# Patient Record
Sex: Female | Born: 1940 | Race: White | Hispanic: No | Smoking: Former smoker
Health system: Southern US, Community
[De-identification: ages and names within clinical notes are randomized; demographics above are authoritative.]

## PROBLEM LIST (undated history)

## (undated) DIAGNOSIS — I10 Essential (primary) hypertension: Secondary | ICD-10-CM

## (undated) DIAGNOSIS — T7840XA Allergy, unspecified, initial encounter: Secondary | ICD-10-CM

## (undated) DIAGNOSIS — I739 Peripheral vascular disease, unspecified: Secondary | ICD-10-CM

## (undated) DIAGNOSIS — G4730 Sleep apnea, unspecified: Secondary | ICD-10-CM

## (undated) DIAGNOSIS — M1990 Unspecified osteoarthritis, unspecified site: Secondary | ICD-10-CM

## (undated) DIAGNOSIS — I4891 Unspecified atrial fibrillation: Secondary | ICD-10-CM

## (undated) DIAGNOSIS — J45909 Unspecified asthma, uncomplicated: Secondary | ICD-10-CM

## (undated) HISTORY — PX: TOE SURGERY: SHX1073

## (undated) HISTORY — DX: Peripheral vascular disease, unspecified: I73.9

---

## 1989-08-16 HISTORY — PX: ABDOMINAL HYSTERECTOMY: SHX81

## 2007-08-17 HISTORY — PX: COLONOSCOPY: SHX174

## 2012-02-29 DIAGNOSIS — E039 Hypothyroidism, unspecified: Secondary | ICD-10-CM | POA: Insufficient documentation

## 2012-02-29 DIAGNOSIS — E785 Hyperlipidemia, unspecified: Secondary | ICD-10-CM | POA: Insufficient documentation

## 2012-02-29 DIAGNOSIS — I482 Chronic atrial fibrillation, unspecified: Secondary | ICD-10-CM | POA: Insufficient documentation

## 2012-02-29 DIAGNOSIS — E782 Mixed hyperlipidemia: Secondary | ICD-10-CM | POA: Insufficient documentation

## 2015-09-25 DIAGNOSIS — J45998 Other asthma: Secondary | ICD-10-CM | POA: Insufficient documentation

## 2015-10-07 DIAGNOSIS — G4733 Obstructive sleep apnea (adult) (pediatric): Secondary | ICD-10-CM | POA: Insufficient documentation

## 2015-12-03 DIAGNOSIS — H903 Sensorineural hearing loss, bilateral: Secondary | ICD-10-CM | POA: Insufficient documentation

## 2015-12-03 DIAGNOSIS — H6060 Unspecified chronic otitis externa, unspecified ear: Secondary | ICD-10-CM | POA: Insufficient documentation

## 2017-05-28 ENCOUNTER — Encounter: Payer: Self-pay | Admitting: Emergency Medicine

## 2017-05-28 ENCOUNTER — Emergency Department: Payer: Medicare Other

## 2017-05-28 ENCOUNTER — Emergency Department
Admission: EM | Admit: 2017-05-28 | Discharge: 2017-05-28 | Disposition: A | Discharge status: Home / Self Care | Payer: Medicare Other | Attending: Emergency Medicine | Admitting: Emergency Medicine

## 2017-05-28 DIAGNOSIS — Y999 Unspecified external cause status: Secondary | ICD-10-CM | POA: Insufficient documentation

## 2017-05-28 DIAGNOSIS — I10 Essential (primary) hypertension: Secondary | ICD-10-CM | POA: Diagnosis not present

## 2017-05-28 DIAGNOSIS — S0990XA Unspecified injury of head, initial encounter: Secondary | ICD-10-CM | POA: Insufficient documentation

## 2017-05-28 DIAGNOSIS — Y929 Unspecified place or not applicable: Secondary | ICD-10-CM | POA: Diagnosis not present

## 2017-05-28 DIAGNOSIS — W010XXA Fall on same level from slipping, tripping and stumbling without subsequent striking against object, initial encounter: Secondary | ICD-10-CM | POA: Insufficient documentation

## 2017-05-28 DIAGNOSIS — S7001XA Contusion of right hip, initial encounter: Secondary | ICD-10-CM | POA: Insufficient documentation

## 2017-05-28 DIAGNOSIS — Y939 Activity, unspecified: Secondary | ICD-10-CM | POA: Insufficient documentation

## 2017-05-28 HISTORY — DX: Essential (primary) hypertension: I10

## 2017-05-28 HISTORY — DX: Unspecified atrial fibrillation: I48.91

## 2017-05-28 NOTE — ED Triage Notes (Addendum)
Patient fell at home tonight hitting her head left occiput, and fell on her elbows and is c/o right hip pain and left elbow pain.  She present AOx4, no co headache or any decreased sensory perception.

## 2017-05-28 NOTE — ED Notes (Signed)
Pt taken to CT via stretcher.

## 2017-05-28 NOTE — ED Notes (Signed)
Pt returned from ct scan

## 2017-05-28 NOTE — ED Notes (Addendum)
Pt ambulatory to treatment room.  States fell tonight at home. States hit head on toilet. States she slipped because no shoes were on her feet. Pt is alert, oriented, moving independently. Able to take off clothes and get into hospital gown. Pt states a blood thinner for a fib.   Taken to scans via stretcher.

## 2017-06-02 NOTE — ED Provider Notes (Signed)
[facility] Emergency Department Provider Note  Time seen:  I have reviewed the triage vital signs and the nursing notes.   HISTORY  Chief Complaint Fall    HPI Ariana Guzman is a 76 y.o. female with below list of chronic medical conditions includingatrial fibrillation currently on anticoagulation presents to the emergency department with history of accidental trip and fall tonight resulting in left occipital head injury and left elbowand right hip pain. Patient states current pain score is 5 out of 10. Patient denies any loss of consciousness.  Past Medical History:  Diagnosis Date  . Atrial fibrillation ([place])   . Hypertension     There are no active problems to display for this patient.   Past Surgical History:  Procedure Laterality Date  . ABDOMINAL HYSTERECTOMY    . TOE SURGERY      Prior to Admission medications   Not on File    Allergies Tramadol and Meloxicam  No family history on file.  Social History Social History  Substance Use Topics  . Smoking status: Not on file  . Smokeless tobacco: Not on file  . Alcohol use Not on file    Review of Systems Constitutional: No fever/chills Eyes: No visual changes. ENT: No sore throat. Cardiovascular: Denies chest pain. Respiratory: Denies shortness of breath. Gastrointestinal: No abdominal pain.  No nausea, no vomiting.  No diarrhea.  No constipation. Genitourinary: Negative for dysuria. Musculoskeletal: positive for left elbow and right hip pain Integumentary: Negative for rash. Neurological: Negative for headaches, focal weakness or numbness.  ____________________________________________   PHYSICAL EXAM:  VITAL SIGNS: ED Triage Vitals [05/28/17 0123]  Enc Vitals Group     BP (!) 191/87     Pulse Rate 79     Resp 18     Temp (!) 97.5 F (36.4 C)     Temp Source Oral     SpO2 98 %     Weight      Height      Head Circumference      Peak Flow      Pain Score    Pain Loc      Pain Edu?      Excl. in [place]?     Constitutional: Alert and oriented. Well appearing and in no acute distress. Eyes: Conjunctivae are normal. PERRL. EOMI. Head: Atraumatic. Mouth/Throat: Mucous membranes are moist. Oropharynx non-erythematous. Neck: No stridor.  No cervical spine tenderness to palpation. Cardiovascular: Normal rate, regular rhythm. Good peripheral circulation. Grossly normal heart sounds. Respiratory: Normal respiratory effort.  No retractions. Lungs CTAB. Gastrointestinal: Soft and nontender. No distention.   Musculoskeletal: No lower extremity tenderness nor edema. No gross deformities of extremities. Neurologic:  Normal speech and language. No gross focal neurologic deficits are appreciated.  Skin:  Skin is warm, dry and intact. No rash noted. Psychiatric: Mood and affect are normal. Speech and behavior are normal.   RADIOLOGY I, [place], personally viewed and evaluated these images (plain radiographs) as part of my medical decision making, as well as reviewing the written report by the radiologist.  CLINICAL DATA:  Pain after falling at home tonight.  EXAM: DG HIP (WITH OR WITHOUT PELVIS) 2-3V RIGHT  COMPARISON:  None.  FINDINGS: There is no evidence of hip fracture or dislocation. There is no evidence of arthropathy or other focal bone abnormality.  IMPRESSION: Negative.   Electronically Signed   By: Andreas Newport M.D.   On: 05/28/2017 02:12  CLINICAL DATA:  Pain after falling at home tonight.  EXAM: LEFT ELBOW - COMPLETE 3+ VIEW  COMPARISON:  None.  FINDINGS: There is no evidence of fracture, dislocation, or joint effusion. Mild arthritic changes. No evidence of joint effusion or hemarthrosis.  IMPRESSION: Negative for acute fracture.   Electronically Signed   By: Andreas Newport M.D.   On: 05/28/2017 02:12  CLINICAL DATA:  Status post fall, with injury to the left side of the occiput. Initial  encounter.  EXAM: CT HEAD WITHOUT CONTRAST  TECHNIQUE: Contiguous axial images were obtained from the base of the skull through the vertex without intravenous contrast.  COMPARISON:  None.  FINDINGS: Brain: No evidence of acute infarction, hemorrhage, hydrocephalus, extra-axial collection or mass lesion/mass effect.  The posterior fossa, including the cerebellum, brainstem and fourth ventricle, is within normal limits. The third and lateral ventricles, and basal ganglia are unremarkable in appearance. The cerebral hemispheres are symmetric in appearance, with normal gray-white differentiation. No mass effect or midline shift is seen.  Vascular: No hyperdense vessel or unexpected calcification.  Skull: There is no evidence of fracture; visualized osseous structures are unremarkable in appearance.  Sinuses/Orbits: The visualized portions of the orbits are within normal limits. The paranasal sinuses and mastoid air cells are well-aerated.  Other: No significant soft tissue abnormalities are seen.  IMPRESSION: No evidence of traumatic intracranial injury or fracture.   Electronically Signed   By: Garald Balding M.D.   On: 05/28/2017 03:03  Procedures   ____________________________________________   INITIAL IMPRESSION / ASSESSMENT AND PLAN / ED COURSE  As part of my medical decision making, I reviewed the following data within the electronic MEDICAL RECORD [id number] year old female presented with history of accidental fall tonight with concern for possible intracranial injury, left elbow fracture or contusion, left hip fracture or contusion. CT scan of the head revealed no acute intracranial abnormality. Left elbow and left hip x-ray likewise negative for fracture or dislocation. Patient and family informed of all clinical findings.     ____________________________________________  FINAL CLINICAL IMPRESSION(S) / ED DIAGNOSES  Final diagnoses:  Injury of head,  initial encounter  Contusion of right hip, initial encounter     MEDICATIONS GIVEN DURING THIS VISIT:  Medications - No data to display   NEW OUTPATIENT MEDICATIONS STARTED DURING THIS VISIT:  There are no discharge medications for this patient.   There are no discharge medications for this patient.   There are no discharge medications for this patient.    Note:  This document was prepared using Dragon voice recognition software and may include unintentional dictation errors.    Gregor Hams, MD 06/02/17 [phone]

## 2017-07-14 ENCOUNTER — Other Ambulatory Visit: Payer: Self-pay | Admitting: Anesthesiology

## 2017-07-14 ENCOUNTER — Other Ambulatory Visit: Payer: Self-pay | Admitting: Pediatrics

## 2017-07-14 DIAGNOSIS — Z1231 Encounter for screening mammogram for malignant neoplasm of breast: Secondary | ICD-10-CM

## 2017-07-28 DIAGNOSIS — Z7901 Long term (current) use of anticoagulants: Secondary | ICD-10-CM | POA: Insufficient documentation

## 2017-08-01 ENCOUNTER — Ambulatory Visit
Admission: RE | Admit: 2017-08-01 | Discharge: 2017-08-01 | Disposition: A | Discharge status: Home / Self Care | Payer: Medicare Other | Source: Ambulatory Visit | Attending: Pediatrics | Admitting: Pediatrics

## 2017-08-01 DIAGNOSIS — Z1231 Encounter for screening mammogram for malignant neoplasm of breast: Secondary | ICD-10-CM | POA: Diagnosis present

## 2017-08-05 ENCOUNTER — Other Ambulatory Visit: Payer: Self-pay | Admitting: *Deleted

## 2017-08-05 ENCOUNTER — Inpatient Hospital Stay
Admission: RE | Admit: 2017-08-05 | Discharge: 2017-08-05 | Disposition: A | Discharge status: Home / Self Care | Payer: Self-pay | Source: Ambulatory Visit | Attending: *Deleted | Admitting: *Deleted

## 2017-08-05 DIAGNOSIS — Z9289 Personal history of other medical treatment: Secondary | ICD-10-CM

## 2017-08-29 ENCOUNTER — Ambulatory Visit
Admission: EM | Admit: 2017-08-29 | Discharge: 2017-08-29 | Disposition: A | Discharge status: Home / Self Care | Payer: Medicare Other | Attending: Family Medicine | Admitting: Family Medicine

## 2017-08-29 ENCOUNTER — Encounter: Payer: Self-pay | Admitting: *Deleted

## 2017-08-29 DIAGNOSIS — L03115 Cellulitis of right lower limb: Secondary | ICD-10-CM

## 2017-08-29 DIAGNOSIS — I809 Phlebitis and thrombophlebitis of unspecified site: Secondary | ICD-10-CM

## 2017-08-29 DIAGNOSIS — I803 Phlebitis and thrombophlebitis of lower extremities, unspecified: Secondary | ICD-10-CM

## 2017-08-29 MED ORDER — CEPHALEXIN 500 MG PO CAPS: 500.0000 mg | ORAL_CAPSULE | Freq: Three times a day (TID) | ORAL | 0 refills | Status: AC

## 2017-08-29 MED ORDER — MUPIROCIN 2 % EX OINT: TOPICAL_OINTMENT | CUTANEOUS | 0 refills | Status: DC

## 2017-08-29 NOTE — ED Provider Notes (Signed)
[facility] ____________________________________________  Time seen: Approximately 1:22 PM  I have reviewed the triage vital signs and the nursing notes.   HISTORY  Chief Complaint Ankle Pain  HPI Ariana Guzman is a 77 y.o. female presenting for evaluation of right ankle tenderness that has been present for the last few days.  Patient denies any fall, injury or direct trauma.  Patient reports that she does have an area that is somewhat red.  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  Denies any bleeding, insect bite or known break in skin.  States her last 2 days she noticed the irritated area had some redness to it.  No drainage.  Denies any pain radiation, paresthesias or other extremity pain.  Reports otherwise feels well.  Reports continues to eat and drink well.  No fevers. Denies chest pain, shortness of breath, abdominal pain. Denies recent sickness. Denies recent antibiotic use.  Reports last tetanus immunization 2016.  Denies history of MRSA.  Denies renal insufficiency.  [name], MD: PCP   Past Medical History:  Diagnosis Date  . Atrial fibrillation ([place])   . Hypertension     There are no active problems to display for this patient.   Past Surgical History:  Procedure Laterality Date  . ABDOMINAL HYSTERECTOMY    . TOE SURGERY       No current facility-administered medications for this encounter.   Current Outpatient Medications:  .  ALPRAZolam (XANAX) 1 MG tablet, Take 1 mg by mouth at bedtime as needed for anxiety., Disp: , Rfl:  .  Ascorbic Acid (VITAMIN C) 1000 MG tablet, Take 1,000 mg by mouth daily., Disp: , Rfl:  .  calcium carbonate (OSCAL) 1500 (600 Ca) MG TABS tablet, Take by mouth 2 (two) times daily with a meal., Disp: , Rfl:  .  cholecalciferol (VITAMIN D) 1000 units tablet, Take 1,000 Units by mouth daily., Disp: , Rfl:  .   Fluticasone-Salmeterol (ADVAIR) 250-50 MCG/DOSE AEPB, Inhale 1 puff into the lungs 2 (two) times daily., Disp: , Rfl:  .  latanoprost (XALATAN) 0.005 % ophthalmic solution, 1 drop at bedtime., Disp: , Rfl:  .  levothyroxine (SYNTHROID, LEVOTHROID) 75 MCG tablet, Take 75 mcg by mouth daily before breakfast., Disp: , Rfl:  .  losartan (COZAAR) 25 MG tablet, Take 25 mg by mouth daily., Disp: , Rfl:  .  Magnesium (MAGNACAPS) 100 MG CAPS, Take by mouth., Disp: , Rfl:  .  metoprolol tartrate (LOPRESSOR) 25 MG tablet, Take 25 mg by mouth 2 (two) times daily., Disp: , Rfl:  .  multivitamin-lutein (OCUVITE-LUTEIN) CAPS capsule, Take 1 capsule by mouth daily., Disp: , Rfl:  .  Omega-3 Fatty Acids (FISH OIL) 1000 MG CAPS, Take by mouth., Disp: , Rfl:  .  Rivaroxaban (XARELTO) 15 MG TABS tablet, Take 15 mg by mouth 2 (two) times daily with a meal., Disp: , Rfl:  .  cephALEXin (KEFLEX) 500 MG capsule, Take 1 capsule (500 mg total) by mouth 3 (three) times daily for 7 days., Disp: 21 capsule, Rfl: 0 .  mupirocin ointment (BACTROBAN) 2 %, Apply two times a day for 7 days., Disp: 22 g, Rfl: 0  Allergies Tramadol and Meloxicam  Family History  Problem Relation Age of Onset  . Breast cancer Other 56  . Dementia Mother   . Cancer Father     Social History Social History   Tobacco Use  .  Smoking status: Never Smoker  . Smokeless tobacco: Never Used  Substance Use Topics  . Alcohol use: No    Frequency: Never  . Drug use: No    Review of Systems Constitutional: No fever/chills Cardiovascular: Denies chest pain. Respiratory: Denies shortness of breath. Gastrointestinal: No abdominal pain.   Musculoskeletal: Negative for back pain. Skin: AS above.  ____________________________________________   PHYSICAL EXAM:  VITAL SIGNS: ED Triage Vitals  Enc Vitals Group     BP 08/29/17 1220 (!) 146/82     Pulse Rate 08/29/17 1220 72     Resp 08/29/17 1220 16     Temp 08/29/17 1220 98.2 F (36.8 C)       Temp Source 08/29/17 1220 Oral     SpO2 08/29/17 1220 97 %     Weight 08/29/17 1221 196 lb (88.9 kg)     Height 08/29/17 1221 5\' 7"  (1.702 m)     Head Circumference --      Peak Flow --      Pain Score 08/29/17 1222 8     Pain Loc --      Pain Edu? --      Excl. in [place]? --     Constitutional: Alert and oriented. Well appearing and in no acute distress. Cardiovascular: Normal rate, regular rhythm. Grossly normal heart sounds.  Good peripheral circulation. Respiratory: Normal respiratory effort without tachypnea nor retractions. Breath sounds are clear and equal bilaterally. No wheezes, rales, rhonchi. Gastrointestinal: Soft and nontender. No distention. Normal Bowel sounds. No CVA tenderness. Musculoskeletal:  Steady gait. Bilateral pedal pulses equal and easily palpated.      Right lower leg:  No tenderness or edema.      Left lower leg:  No tenderness or edema.  Except: Right lateral ankle area of approximately 1.5 cm erythema and minimal tenderness to direct palpation, no point bony tenderness, no edema, erythema present directly surrounding superficial varicose vein, right lower extremity with extensive varicose veins, right lower extremity otherwise nontender. Neurologic:  Normal speech and language. No gross focal neurologic deficits are appreciated. Speech is normal. No gait instability.  Skin:  Skin is warm, dry and intact. No rash noted. Psychiatric: Mood and affect are normal. Speech and behavior are normal. Patient exhibits appropriate insight and judgment   ___________________________________________   LABS (all labs ordered are listed, but only abnormal results are displayed)  Labs Reviewed - No data to display ____________________________________________  PROCEDURES Procedures   INITIAL IMPRESSION / ASSESSMENT AND PLAN / ED COURSE  Pertinent labs & imaging results that were available during my care of the patient were reviewed by me and considered in my medical  decision making (see chart for details).  Very well-appearing patient.  No acute distress.  Patient with extensive bilateral lower extremity varicose veins.  Suspect right lower ankle area of phlebitis versus local irritation from taking socks on and off, and as erythema present concern for secondary cellulitis.  Will treat patient with oral Keflex and topical Bactroban.  Encouraged to continue home compression stockings, elevation and close monitoring. Discussed indication, risks and benefits of medications with patient.  Discussed follow up with Primary care physician this week. Discussed follow up and return parameters including no resolution or any worsening concerns. Patient verbalized understanding and agreed to plan.   ____________________________________________   FINAL CLINICAL IMPRESSION(S) / ED DIAGNOSES  Final diagnoses:  Phlebitis  Cellulitis of right ankle     ED Discharge Orders        Ordered  cephALEXin (KEFLEX) 500 MG capsule  3 times daily     08/29/17 1316    mupirocin ointment (BACTROBAN) 2 %     08/29/17 1316       Note: This dictation was prepared with Dragon dictation along with smaller phrase technology. Any transcriptional errors that result from this process are unintentional.         Marylene Land, NP 08/29/17 2052

## 2017-08-29 NOTE — ED Triage Notes (Signed)
Right ankle pain x3 days. Denies injury.

## 2017-08-29 NOTE — Discharge Instructions (Signed)
Take medication as prescribed. Rest. Drink plenty of fluids. Keep clean. Elevate.   Follow up with vascular as needed, as discussed.   Follow up with your primary care physician this week as needed. Return to Urgent care for new or worsening concerns.

## 2017-09-01 ENCOUNTER — Telehealth: Payer: Self-pay | Admitting: Emergency Medicine

## 2017-09-01 NOTE — Telephone Encounter (Signed)
Called patient to follow up after her recent visit. Left message to call with any questions or concerns. 

## 2017-09-09 ENCOUNTER — Ambulatory Visit (INDEPENDENT_AMBULATORY_CARE_PROVIDER_SITE_OTHER): Payer: Self-pay | Admitting: Vascular Surgery

## 2017-09-09 ENCOUNTER — Encounter (INDEPENDENT_AMBULATORY_CARE_PROVIDER_SITE_OTHER): Payer: Self-pay | Admitting: Vascular Surgery

## 2017-09-09 VITALS — BP 140/72 | HR 79 | Resp 16 | Ht 67.0 in | Wt 211.0 lb

## 2017-09-09 DIAGNOSIS — I83813 Varicose veins of bilateral lower extremities with pain: Secondary | ICD-10-CM | POA: Insufficient documentation

## 2017-09-09 DIAGNOSIS — M79605 Pain in left leg: Secondary | ICD-10-CM | POA: Insufficient documentation

## 2017-09-09 DIAGNOSIS — I83891 Varicose veins of right lower extremities with other complications: Secondary | ICD-10-CM

## 2017-09-09 DIAGNOSIS — R600 Localized edema: Secondary | ICD-10-CM

## 2017-09-09 DIAGNOSIS — I83893 Varicose veins of bilateral lower extremities with other complications: Secondary | ICD-10-CM | POA: Insufficient documentation

## 2017-09-09 DIAGNOSIS — M79604 Pain in right leg: Secondary | ICD-10-CM

## 2017-09-09 DIAGNOSIS — I10 Essential (primary) hypertension: Secondary | ICD-10-CM | POA: Insufficient documentation

## 2017-09-09 NOTE — Progress Notes (Signed)
Subjective:    Patient ID: Ariana Guzman, female    DOB: 03/17/1941, 77 y.o.   MRN: [id number] Chief Complaint  Patient presents with  . New Admit To SNF    [facility] follow up for vein bleeding  . New Patient (Initial Visit)   Presents as a new patient with a chief complaint of "bleeding varicose vein".  The patient had an episode of bleeding from a varicosity located on her right lateral ankle.  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  The patient has had surgical excision of some of her superficial varicosities.  It does not sound like she has had her great saphenous veins stripped or ablated.  The patient does wear thigh-high medical grade 1 compression stockings and engages in elevation of her lower extremity on a daily basis.  This provides minimal relief in her symptoms.  The patient notes pain along her varicosities and experiences lower extremity edema on a daily basis.  Her symptoms have progressed to the point that she is unable to function on a daily basis.  Her symptoms are lifestyle limiting.  The patient is very concerned that she has now experienced a bleeding episode from a varicosity located on her right ankle.  The patient denies any surgery or trauma to the bilateral lower extremity.  The patient denies any DVT history to the bilateral lower extremity.  The patient denies any rest pain or ulceration to the lower extremity.  The patient does experience some pain with ambulation.  This discomfort radiates up and down both legs.  The patient denies any fever, nausea or vomiting.   Review of Systems  Constitutional: Negative.   HENT: Negative.   Eyes: Negative.   Respiratory: Negative.   Cardiovascular:       Varicose veins, hemorrhage Painful varicose veins bilaterally Lower extremity edema Lower extremity pain  Gastrointestinal: Negative.   Endocrine: Negative.   Genitourinary: Negative.   Musculoskeletal:  Negative.   Skin: Negative.   Allergic/Immunologic: Negative.   Neurological: Negative.   Hematological: Negative.   Psychiatric/Behavioral: Negative.       Objective:   Physical Exam  Constitutional: She is oriented to person, place, and time. She appears well-developed and well-nourished. No distress.  HENT:  Head: Normocephalic and atraumatic.  Eyes: Conjunctivae are normal. Pupils are equal, round, and reactive to light.  Neck: Normal range of motion.  Cardiovascular: Normal rate, regular rhythm, normal heart sounds and intact distal pulses.  Pulses:      Radial pulses are 2+ on the right side, and 2+ on the left side.  Hard to palpate pedal pulses however her bilateral feet are warm  Pulmonary/Chest: Effort normal and breath sounds normal.  Musculoskeletal: Normal range of motion. She exhibits edema (Mild nonpitting edema noted to the bilateral ankles.).  Neurological: She is alert and oriented to person, place, and time.  Skin: She is not diaphoretic.  Right ankle: Lateral aspect -small scab is noted at the area of hemorrhage. The patient has diffuse less than 1 and greater than 1 cm varicosities noted to the bilateral lower extremity. There is no cellulitis, stasis dermatitis or skin changes noted bilaterally  Psychiatric: She has a normal mood and affect. Her behavior is normal. Judgment and thought content normal.  Vitals reviewed.  BP 140/72 (BP Location: Right Arm)   Pulse 79   Resp 16   Ht 5\' 7"  (1.702 m)   Wt 211 lb (95.7 kg)  BMI 33.05 kg/m   Past Medical History:  Diagnosis Date  . Atrial fibrillation ([place])   . Hypertension    Social History   Socioeconomic History  . Marital status: Widowed    Spouse name: Not on file  . Number of children: Not on file  . Years of education: Not on file  . Highest education level: Not on file  Social Needs  . Financial resource strain: Not on file  . Food insecurity - worry: Not on file  . Food insecurity -  inability: Not on file  . Transportation needs - medical: Not on file  . Transportation needs - non-medical: Not on file  Occupational History  . Not on file  Tobacco Use  . Smoking status: Never Smoker  . Smokeless tobacco: Never Used  Substance and Sexual Activity  . Alcohol use: No    Frequency: Never  . Drug use: No  . Sexual activity: Not on file  Other Topics Concern  . Not on file  Social History Narrative  . Not on file   Past Surgical History:  Procedure Laterality Date  . ABDOMINAL HYSTERECTOMY    . TOE SURGERY     Family History  Problem Relation Age of Onset  . Breast cancer Other 56  . Dementia Mother   . Cancer Father    Allergies  Allergen Reactions  . Tramadol Shortness Of Breath  . Meloxicam Rash      Assessment & Plan:  Presents as a new patient with a chief complaint of "bleeding varicose vein".  The patient had an episode of bleeding from a varicosity located on her right lateral ankle.  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  The patient has had surgical excision of some of her superficial varicosities.  It does not sound like she has had her great saphenous veins stripped or ablated.  The patient does wear thigh-high medical grade 1 compression stockings and engages in elevation of her lower extremity on a daily basis.  This provides minimal relief in her symptoms.  The patient notes pain along her varicosities and experiences lower extremity edema on a daily basis.  Her symptoms have progressed to the point that she is unable to function on a daily basis.  Her symptoms are lifestyle limiting.  The patient is very concerned that she has now experienced a bleeding episode from a varicosity located on her right ankle.  The patient denies any surgery or trauma to the bilateral lower extremity.  The patient denies any DVT history to the bilateral lower extremity.  The patient denies any rest pain  or ulceration to the lower extremity.  The patient does experience some pain with ambulation.  This discomfort radiates up and down both legs.  The patient denies any fever, nausea or vomiting.  1. Hemorrhage of varicose veins of lower extremity, right - New The patient experienced an episode of hemorrhage from a right varicosity located to the lateral right ankle I would like to treat this area with saline sclerotherapy to avoid another episode of bleeding. I will apply to the patient's insurance for this. I discussed how to control vascular bleeding with the patient today.  The patient was directed to place direct pressure with 1 or 2 fingers to the area of bleeding and elevate her legs. The patient expresses her understanding  - VAS [place] LOWER EXTREMITY VENOUS REFLUX; Future  2. Varicose veins of both lower extremities with  pain - New The patient will continue to wear graduated compression stockings (20-30 mmHg) on a daily basis. The patient was instructed to begin wearing the stockings first thing in the morning and removing them in the evening. The patient was instructed specifically not to sleep in the stockings.  Elevation during the day will be continued. Anti-inflammatories for pain. The patient will follow up in one months to asses conservative management.  The patient was instructed to call the office in the interim if any worsening edema or ulcerations to the legs, feet or toes occurs. The patient expresses their understanding.  - VAS [place] LOWER EXTREMITY VENOUS REFLUX; Future  3. Bilateral lower extremity edema - New As above  - VAS [place] LOWER EXTREMITY VENOUS REFLUX; Future  4. Pain in both lower extremities - New The patient has the use less than 1 cm and greater than 1 cm varicosities to the bilateral lower extremity. This is most likely a contributing factor to the patient's lower extremity discomfort. The patient also has osteoarthritis and degenerative disc disease. I will  bring the patient back for an ABI to rule out any contributing peripheral artery disease as I was unable to palpate her pedal pulses which may be due to body habitus and edema.  - VAS [place] ABI WITH/WO TBI; Future  5. Essential hypertension - Stable Encouraged good control as its slows the progression of atherosclerotic disease  Current Outpatient Medications on File Prior to Visit  Medication Sig Dispense Refill  . ALPRAZolam (XANAX) 1 MG tablet Take 1 mg by mouth at bedtime as needed for anxiety.    . Ascorbic Acid (VITAMIN C) 1000 MG tablet Take 1,000 mg by mouth daily.    . calcium carbonate (OSCAL) 1500 (600 Ca) MG TABS tablet Take by mouth 2 (two) times daily with a meal.    . cetirizine (ZYRTEC) 10 MG tablet Take 10 mg by mouth daily.    . cholecalciferol (VITAMIN D) 1000 units tablet Take 1,000 Units by mouth daily.    . Fluticasone-Salmeterol (ADVAIR) 250-50 MCG/DOSE AEPB Inhale 1 puff into the lungs 2 (two) times daily.    Marland Kitchen latanoprost (XALATAN) 0.005 % ophthalmic solution 1 drop at bedtime.    Marland Kitchen levothyroxine (SYNTHROID, LEVOTHROID) 75 MCG tablet Take 75 mcg by mouth daily before breakfast.    . losartan (COZAAR) 25 MG tablet Take 25 mg by mouth daily.    . Magnesium (MAGNACAPS) 100 MG CAPS Take by mouth.    . metoprolol tartrate (LOPRESSOR) 25 MG tablet Take 25 mg by mouth 2 (two) times daily.    . multivitamin-lutein (OCUVITE-LUTEIN) CAPS capsule Take 1 capsule by mouth daily.    . mupirocin ointment (BACTROBAN) 2 % Apply two times a day for 7 days. 22 g 0  . Omega-3 Fatty Acids (FISH OIL) 1000 MG CAPS Take by mouth.    . Rivaroxaban (XARELTO) 15 MG TABS tablet Take 15 mg by mouth 2 (two) times daily with a meal.     No current facility-administered medications on file prior to visit.     There are no Patient Instructions on file for this visit. No Follow-up on file.   KIMBERLY A STEGMAYER, PA-C

## 2017-09-12 ENCOUNTER — Telehealth (INDEPENDENT_AMBULATORY_CARE_PROVIDER_SITE_OTHER): Payer: Self-pay | Admitting: Vascular Surgery

## 2017-09-12 NOTE — Telephone Encounter (Signed)
CONTACTED PT TO SCHEDULE SCLEROTHERAPY AS REQUESTED BY [place]. PT DECLINED TO SCHEDULED AT THIS TIME. STATES SHE IS GOING TO SEE HER PCP ON Friday AND WILL HAVE HER LOOK AT IT. WILL CALL BACK IF SHE DECIDES TO SCHEDULE

## 2017-09-15 DIAGNOSIS — F5101 Primary insomnia: Secondary | ICD-10-CM | POA: Insufficient documentation

## 2017-09-15 DIAGNOSIS — F411 Generalized anxiety disorder: Secondary | ICD-10-CM | POA: Insufficient documentation

## 2017-09-28 ENCOUNTER — Ambulatory Visit
Admission: EM | Admit: 2017-09-28 | Discharge: 2017-09-28 | Disposition: A | Discharge status: Home / Self Care | Payer: Medicare Other | Attending: Family Medicine | Admitting: Family Medicine

## 2017-09-28 ENCOUNTER — Encounter: Payer: Self-pay | Admitting: Emergency Medicine

## 2017-09-28 ENCOUNTER — Other Ambulatory Visit: Payer: Self-pay

## 2017-09-28 DIAGNOSIS — J45901 Unspecified asthma with (acute) exacerbation: Secondary | ICD-10-CM

## 2017-09-28 DIAGNOSIS — J069 Acute upper respiratory infection, unspecified: Secondary | ICD-10-CM | POA: Diagnosis not present

## 2017-09-28 DIAGNOSIS — B9789 Other viral agents as the cause of diseases classified elsewhere: Secondary | ICD-10-CM

## 2017-09-28 DIAGNOSIS — J0100 Acute maxillary sinusitis, unspecified: Secondary | ICD-10-CM

## 2017-09-28 DIAGNOSIS — R05 Cough: Secondary | ICD-10-CM | POA: Diagnosis not present

## 2017-09-28 HISTORY — DX: Unspecified asthma, uncomplicated: J45.909

## 2017-09-28 MED ORDER — ALBUTEROL SULFATE HFA 108 (90 BASE) MCG/ACT IN AERS: 2.0000 | INHALATION_SPRAY | RESPIRATORY_TRACT | 0 refills | Status: DC | PRN

## 2017-09-28 MED ORDER — AMOXICILLIN-POT CLAVULANATE 875-125 MG PO TABS: 1.0000 | ORAL_TABLET | Freq: Two times a day (BID) | ORAL | 0 refills | Status: DC

## 2017-09-28 MED ORDER — PREDNISONE 10 MG PO TABS: ORAL_TABLET | ORAL | 0 refills | Status: DC

## 2017-09-28 MED ORDER — BENZONATATE 100 MG PO CAPS: 100.0000 mg | ORAL_CAPSULE | Freq: Three times a day (TID) | ORAL | 0 refills | Status: DC | PRN

## 2017-09-28 NOTE — ED Provider Notes (Signed)
[facility] ____________________________________________  Time seen: Approximately 12:20 PM  I have reviewed the triage vital signs and the nursing notes.   HISTORY  Chief Complaint Cough   HPI Ariana Guzman is a 77 y.o. female presenting for evaluation of runny nose, nasal congestion, postnasal drainage, some sinus pressure and cough.  Patient reports history of asthma and she has noticed some intermittent wheezing in this.  Denies chest pain or shortness of breath.  States some tightness in chest with cough.  Denies sore throat.  Reports continues to eat and drink well.  Denies recent sickness or recent asthma exacerbation.  Reports is continue to remain active.  Patient reports she has had some nasal congestion for about 5 or 6 days, but reports for the last 3 days drainage has thickened and to dark yellow and present with postnasal drainage as well as cough.  Denies fevers.  Has used home albuterol nebulizer intermittently, last used yesterday with some improvement.  Denies other aggravating or alleviating factors. Denies chest pain, shortness of breath, abdominal pain, dysuria, extremity pain, extremity swelling or rash.  [name], MD: PCP  Past Medical History:  Diagnosis Date  . Asthma   . Atrial fibrillation ([place])   . Hypertension     Patient Active Problem List   Diagnosis Date Noted  . Essential hypertension 09/09/2017  . 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 Bilateral lower extremity edema 09/09/2017  . Pain in both lower extremities 09/09/2017    Past Surgical History:  Procedure Laterality Date  . ABDOMINAL HYSTERECTOMY    . TOE SURGERY       No current facility-administered medications for this encounter.   Current Outpatient Medications:  .  ALPRAZolam (XANAX) 1 MG tablet, Take 1 mg by mouth at bedtime as needed for anxiety., Disp: , Rfl:  .   Ascorbic Acid (VITAMIN C) 1000 MG tablet, Take 1,000 mg by mouth daily., Disp: , Rfl:  .  calcium carbonate (OSCAL) 1500 (600 Ca) MG TABS tablet, Take by mouth 2 (two) times daily with a meal., Disp: , Rfl:  .  cetirizine (ZYRTEC) 10 MG tablet, Take 10 mg by mouth daily., Disp: , Rfl:  .  cholecalciferol (VITAMIN D) 1000 units tablet, Take 1,000 Units by mouth daily., Disp: , Rfl:  .  Fluticasone-Salmeterol (ADVAIR) 250-50 MCG/DOSE AEPB, Inhale 1 puff into the lungs 2 (two) times daily., Disp: , Rfl:  .  latanoprost (XALATAN) 0.005 % ophthalmic solution, 1 drop at bedtime., Disp: , Rfl:  .  levothyroxine (SYNTHROID, LEVOTHROID) 75 MCG tablet, Take 75 mcg by mouth daily before breakfast., Disp: , Rfl:  .  losartan (COZAAR) 25 MG tablet, Take 25 mg by mouth daily., Disp: , Rfl:  .  Magnesium (MAGNACAPS) 100 MG CAPS, Take by mouth., Disp: , Rfl:  .  metoprolol tartrate (LOPRESSOR) 25 MG tablet, Take 25 mg by mouth 2 (two) times daily., Disp: , Rfl:  .  multivitamin-lutein (OCUVITE-LUTEIN) CAPS capsule, Take 1 capsule by mouth daily., Disp: , Rfl:  .  Omega-3 Fatty Acids (FISH OIL) 1000 MG CAPS, Take by mouth., Disp: , Rfl:  .  Rivaroxaban (XARELTO) 15 MG TABS tablet, Take 15 mg by mouth 2 (two) times daily with a meal., Disp: , Rfl:  .  albuterol (PROVENTIL HFA;VENTOLIN HFA) 108 (90 Base) MCG/ACT inhaler, Inhale 2 puffs into the lungs every 4 (four) hours as needed for  wheezing., Disp: 1 Inhaler, Rfl: 0 .  amoxicillin-clavulanate (AUGMENTIN) 875-125 MG tablet, Take 1 tablet by mouth every 12 (twelve) hours., Disp: 20 tablet, Rfl: 0 .  benzonatate (TESSALON PERLES) 100 MG capsule, Take 1 capsule (100 mg total) by mouth 3 (three) times daily as needed for cough., Disp: 15 capsule, Rfl: 0 .  predniSONE (DELTASONE) 10 MG tablet, Start 60 mg po day one, then 50 mg po day two, taper by 10 mg daily until complete., Disp: 21 tablet, Rfl: 0  Allergies Tramadol and Meloxicam  Family History  Problem  Relation Age of Onset  . Breast cancer Other 56  . Dementia Mother   . Cancer Father     Social History Social History   Tobacco Use  . Smoking status: Former Smoker    Last attempt to quit: 09/28/1989    Years since quitting: 28.0  . Smokeless tobacco: Never Used  Substance Use Topics  . Alcohol use: No    Frequency: Never  . Drug use: No    Review of Systems Constitutional: No fever/chills Eyes: No visual changes. ENT: No sore throat. Cardiovascular: Denies chest pain. Respiratory: Denies shortness of breath. Gastrointestinal: No abdominal pain.  No nausea, no vomiting.  No diarrhea.  No constipation. Skin: Negative for rash.   ____________________________________________   PHYSICAL EXAM:  VITAL SIGNS: ED Triage Vitals  Enc Vitals Group     BP 09/28/17 1108 (!) 150/50     Pulse Rate 09/28/17 1108 82     Resp 09/28/17 1108 16     Temp 09/28/17 1108 98.1 F (36.7 C)     Temp Source 09/28/17 1108 Oral     SpO2 09/28/17 1108 100 %     Weight 09/28/17 1107 195 lb (88.5 kg)     Height 09/28/17 1107 5\' 7"  (1.702 m)     Head Circumference --      Peak Flow --      Pain Score 09/28/17 1107 6     Pain Loc --      Pain Edu? --      Excl. in [place]? --     Constitutional: Alert and oriented. Well appearing and in no acute distress. Eyes: Conjunctivae are normal.  Head: Atraumatic.  Mild bilateral frontal sinus tenderness palpation, mild to moderate bilateral maxillary sinus tenderness to palpation.  No swelling. No erythema.  Ears: no erythema, normal TMs bilaterally.   Nose:Nasal congestion   Mouth/Throat: Mucous membranes are moist. No pharyngeal erythema. No tonsillar swelling or exudate.  Neck: No stridor.  No cervical spine tenderness to palpation. Hematological/Lymphatic/Immunilogical: No cervical lymphadenopathy. Cardiovascular: Normal rate, regular rhythm. Grossly normal heart sounds.  Good peripheral circulation. Respiratory: Normal respiratory effort.  No  retractions.  Mild scattered wheezing.  No rhonchi.  No focal area of consolidation.  Dry intermittent cough with mild bronchospasm.  Speaks in complete sentences.  Good air movement.  Gastrointestinal: Soft and nontender.  Musculoskeletal: Ambulatory with steady gait. No cervical, thoracic or lumbar tenderness to palpation. Neurologic:  Normal speech and language. No gait instability. Skin:  Skin appears warm, dry and intact. No rash noted. Psychiatric: Mood and affect are normal. Speech and behavior are normal.  ___________________________________________   LABS (all labs ordered are listed, but only abnormal results are displayed)  Labs Reviewed - No data to display ____________________________________________   PROCEDURES Procedures    INITIAL IMPRESSION / ASSESSMENT AND PLAN / ED COURSE  Pertinent labs & imaging results that were available during my care  of the patient were reviewed by me and considered in my medical decision making (see chart for details).  Well-appearing patient.  No acute distress.  Suspect recent viral upper respiratory infection with asthma exacerbation.  Patient appears well.  Denies fevers.  Mild wheezing and bronchospasm but lungs otherwise clear throughout.  Discussed will defer chest x-ray at this time, patient agrees, discussed reevaluation and imaging if symptoms persist.  Also concern for secondary sinusitis.  Will treat patient with oral Augmentin due to multiple comorbidities, prednisone taper, PRN Tessalon Perles.  Continue home nebulizer as needed but also given albuterol inhaler for convenience.  Encourage rest, fluids, supportive care.  Discussed strict follow-up and return parameters.Discussed indication, risks and benefits of medications with patient.  Discussed follow up with Primary care physician this week. Discussed follow up and return parameters including no resolution or any worsening concerns. Patient verbalized understanding and agreed to  plan.   ____________________________________________   FINAL CLINICAL IMPRESSION(S) / ED DIAGNOSES  Final diagnoses:  Viral URI with cough  Acute maxillary sinusitis, recurrence not specified  Exacerbation of asthma, unspecified asthma severity, unspecified whether persistent     ED Discharge Orders        Ordered    amoxicillin-clavulanate (AUGMENTIN) 875-125 MG tablet  Every 12 hours     09/28/17 1209    albuterol (PROVENTIL HFA;VENTOLIN HFA) 108 (90 Base) MCG/ACT inhaler  Every 4 hours PRN     09/28/17 1209    predniSONE (DELTASONE) 10 MG tablet     09/28/17 1209    benzonatate (TESSALON PERLES) 100 MG capsule  3 times daily PRN     09/28/17 1209       Note: This dictation was prepared with Dragon dictation along with smaller phrase technology. Any transcriptional errors that result from this process are unintentional.         Marylene Land, NP 09/28/17 1329

## 2017-09-28 NOTE — Discharge Instructions (Signed)
Take medication as prescribed. Rest. Drink plenty of fluids.  ° °Follow up with your primary care physician this week as needed. Return to Urgent care for new or worsening concerns.  ° °

## 2017-09-28 NOTE — ED Triage Notes (Signed)
Patient in today c/o productive cough (yellow) x 2-3 days. Patient denies fever, but has had chills. Has not taken temperature.

## 2017-10-12 ENCOUNTER — Encounter (INDEPENDENT_AMBULATORY_CARE_PROVIDER_SITE_OTHER): Payer: Self-pay | Admitting: Vascular Surgery

## 2017-10-12 ENCOUNTER — Ambulatory Visit (INDEPENDENT_AMBULATORY_CARE_PROVIDER_SITE_OTHER): Payer: Medicare Other | Admitting: Vascular Surgery

## 2017-10-12 ENCOUNTER — Ambulatory Visit (INDEPENDENT_AMBULATORY_CARE_PROVIDER_SITE_OTHER): Payer: Medicare Other

## 2017-10-12 VITALS — BP 167/80 | HR 82 | Resp 16 | Wt 194.8 lb

## 2017-10-12 DIAGNOSIS — I83813 Varicose veins of bilateral lower extremities with pain: Secondary | ICD-10-CM

## 2017-10-12 DIAGNOSIS — I83891 Varicose veins of right lower extremities with other complications: Secondary | ICD-10-CM

## 2017-10-12 DIAGNOSIS — M79605 Pain in left leg: Secondary | ICD-10-CM | POA: Diagnosis not present

## 2017-10-12 DIAGNOSIS — R600 Localized edema: Secondary | ICD-10-CM | POA: Diagnosis not present

## 2017-10-12 DIAGNOSIS — M79604 Pain in right leg: Secondary | ICD-10-CM

## 2017-10-12 NOTE — Progress Notes (Signed)
Subjective:    Patient ID: Ariana Guzman, female    DOB: 17-Jul-1941, 77 y.o.   MRN: [id number] Chief Complaint  Patient presents with  . Follow-up    48month abi,bil ven reflux   Patient presents to review vascular studies.  The patient was last seen on September 09, 2017 for evaluation of a bleeding varicosity located to the lateral aspect of her right leg.  At the time, we had a long discussion about sclerotherapy to the area in an effort to seal the diffuse amount of superficial varicosities to the area and an effort to stop any future bleeding.  During our initial visit, the patient understood that I would need to apply to her insurance and agreed to moving forward with me applying to her insurance and the procedure.  The patient was called on September 04, 2017 and informed that her insurance had given approval however the patient refused sclerotherapy and stated she would go to her primary care physician to have her sclerotherapy done.  Patient presents today still concerned about bleeding from the area.  She has not experienced any additional bleeding episodes however continuously wears a bandage to the area as she is afraid it is going to continue to bleed.  The patient notes that she has started to engage in conservative therapy including wearing medical grade 1 compression stockings and elevating her legs.  The patient underwent a bilateral ABI which was notable for no significant lower extremity arterial disease.  The bilateral toe brachial indices were normal.  The patient underwent a bilateral venous duplex which was notable for reflux in the right lower extremity located in the common femoral vein and great saphenous vein.  The patient was found to have reflux in the left lower extremity located in the common femoral vein and great saphenous vein at the groin.  There is no evidence of deep vein or superficial vein thrombosis in the bilateral lower extremity.  The patient denies any fever, nausea  vomiting.  [employer] nursing assistant was present during this interaction and examination of the patient.   Review of Systems  Constitutional: Negative.   HENT: Negative.   Eyes: Negative.   Respiratory: Negative.   Cardiovascular:       Bleeding varicosity Full varicose veins Lower extremity edema  Gastrointestinal: Negative.   Endocrine: Negative.   Genitourinary: Negative.   Musculoskeletal: Negative.   Skin: Negative.   Allergic/Immunologic: Negative.   Neurological: Negative.   Hematological: Negative.   Psychiatric/Behavioral: Negative.       Objective:   Physical Exam  Constitutional: She is oriented to person, place, and time. She appears well-developed. No distress.  HENT:  Head: Normocephalic.  Eyes: Conjunctivae are normal. Pupils are equal, round, and reactive to light.  Neck: Normal range of motion.  Cardiovascular: Normal rate, regular rhythm, normal heart sounds and intact distal pulses.  Pulses:      Radial pulses are 2+ on the right side, and 2+ on the left side.  Hard to palpate pedal pulses however the bilateral feet are warm  Pulmonary/Chest: Effort normal and breath sounds normal.  Musculoskeletal: Normal range of motion. She exhibits no edema (Minimal to no edema was noted to the bilateral lower extremity).  Neurological: She is alert and oriented to person, place, and time.  Skin: She is not diaphoretic.  The skin exam has not changed since the previous on 09/09/17 Right ankle: Lateral aspect -small scab is noted at the area of hemorrhage. The patient  has diffuse less than 1 and greater than 1 cm varicosities noted to the bilateral lower extremity. There is no cellulitis, stasis dermatitis or skin changes   Psychiatric: She has a normal mood and affect. Her behavior is normal. Judgment and thought content normal.  Vitals reviewed.  BP (!) 167/80 (BP Location: Right Arm)   Pulse 82   Resp 16   Wt 194 lb 12.8 oz (88.4 kg)   BMI 30.51 kg/m     Past Medical History:  Diagnosis Date  . Asthma   . Atrial fibrillation ([place])   . Hypertension    Social History   Socioeconomic History  . Marital status: Widowed    Spouse name: Not on file  . Number of children: Not on file  . Years of education: Not on file  . Highest education level: Not on file  Social Needs  . Financial resource strain: Not on file  . Food insecurity - worry: Not on file  . Food insecurity - inability: Not on file  . Transportation needs - medical: Not on file  . Transportation needs - non-medical: Not on file  Occupational History  . Not on file  Tobacco Use  . Smoking status: Former Smoker    Last attempt to quit: 09/28/1989    Years since quitting: 28.0  . Smokeless tobacco: Never Used  Substance and Sexual Activity  . Alcohol use: No    Frequency: Never  . Drug use: No  . Sexual activity: Not on file  Other Topics Concern  . Not on file  Social History Narrative  . Not on file   Past Surgical History:  Procedure Laterality Date  . ABDOMINAL HYSTERECTOMY    . TOE SURGERY     Family History  Problem Relation Age of Onset  . Breast cancer Other 56  . Dementia Mother   . Cancer Father    Allergies  Allergen Reactions  . Tramadol Shortness Of Breath  . Meloxicam Rash      Assessment & Plan:  Patient presents to review vascular studies.  The patient was last seen on September 09, 2017 for evaluation of a bleeding varicosity located to the lateral aspect of her right leg.  At the time, we had a long discussion about sclerotherapy to the area in an effort to seal the diffuse amount of superficial varicosities to the area and an effort to stop any future bleeding.  During our initial visit, the patient understood that I would need to apply to her insurance and agreed to moving forward with me applying to her insurance and the procedure.  The patient was called on September 04, 2017 and informed that her insurance had given approval however the  patient refused sclerotherapy and stated she would go to her primary care physician to have her sclerotherapy done.  Patient presents today still concerned about bleeding from the area.  She has not experienced any additional bleeding episodes however continuously wears a bandage to the area as she is afraid it is going to continue to bleed.  The patient notes that she has started to engage in conservative therapy including wearing medical grade 1 compression stockings and elevating her legs.  The patient underwent a bilateral ABI which was notable for no significant lower extremity arterial disease.  The bilateral toe brachial indices were normal.  The patient underwent a bilateral venous duplex which was notable for reflux in the right lower extremity located in the common femoral vein and great saphenous  vein.  The patient was found to have reflux in the left lower extremity located in the common femoral vein and great saphenous vein at the groin.  There is no evidence of deep vein or superficial vein thrombosis in the bilateral lower extremity.  The patient denies any fever, nausea vomiting.  [employer] nursing assistant was present during this interaction and examination of the patient.  1. 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  I tried to explain to her that since her primary care physician was not a vascular surgeon that it was out of her scope of practice.  The patient states that she never received a phone call from our office stating that her sclerotherapy was approved and she never refused it.  At this point, we will reapplied to the patient's insurance asking for sclerotherapy to the lateral aspect of the right ankle in an effort to seal her diffuse amount of varicosities and prevent any future bleeding. The patient is to continue to engage in conservative  therapy including wearing medical grade 1 compression stockings and elevating her leg At the three-month mark, she may be a candidate for endovenous laser ablation however she will need to continue participating in conservative therapy until then The patient was instructed to call EMS if she should experience any additional bleeding from any of her varicosities   2. Varicose veins of both lower extremities with pain - Stable As above  Current Outpatient Medications on File Prior to Visit  Medication Sig Dispense Refill  . albuterol (PROVENTIL HFA;VENTOLIN HFA) 108 (90 Base) MCG/ACT inhaler Inhale 2 puffs into the lungs every 4 (four) hours as needed for wheezing. 1 Inhaler 0  . ALPRAZolam (XANAX) 1 MG tablet Take 1 mg by mouth at bedtime as needed for anxiety.    Marland Kitchen amoxicillin-clavulanate (AUGMENTIN) 875-125 MG tablet Take 1 tablet by mouth every 12 (twelve) hours. 20 tablet 0  . Ascorbic Acid (VITAMIN C) 1000 MG tablet Take 1,000 mg by mouth daily.    . benzonatate (TESSALON PERLES) 100 MG capsule Take 1 capsule (100 mg total) by mouth 3 (three) times daily as needed for cough. 15 capsule 0  . calcium carbonate (OSCAL) 1500 (600 Ca) MG TABS tablet Take by mouth 2 (two) times daily with a meal.    . cetirizine (ZYRTEC) 10 MG tablet Take 10 mg by mouth daily.    . cholecalciferol (VITAMIN D) 1000 units tablet Take 1,000 Units by mouth daily.    . Fluticasone-Salmeterol (ADVAIR) 250-50 MCG/DOSE AEPB Inhale 1 puff into the lungs 2 (two) times daily.    Marland Kitchen latanoprost (XALATAN) 0.005 % ophthalmic solution 1 drop at bedtime.    Marland Kitchen levothyroxine (SYNTHROID, LEVOTHROID) 75 MCG tablet Take 75 mcg by mouth daily before breakfast.    . losartan (COZAAR) 25 MG tablet Take 25 mg by mouth daily.    . Magnesium (MAGNACAPS) 100 MG CAPS Take by mouth.    . metoprolol tartrate (LOPRESSOR) 25 MG tablet Take 25 mg by mouth 2 (two) times daily.    . multivitamin-lutein (OCUVITE-LUTEIN) CAPS capsule Take 1 capsule  by mouth daily.    . Omega-3 Fatty Acids (FISH OIL) 1000 MG CAPS Take by mouth.    . predniSONE (DELTASONE) 10 MG tablet Start 60 mg po day one, then 50 mg po day two, taper by 10 mg daily until complete. 21  tablet 0  . Rivaroxaban (XARELTO) 15 MG TABS tablet Take 15 mg by mouth 2 (two) times daily with a meal.     No current facility-administered medications on file prior to visit.    There are no Patient Instructions on file for this visit. No Follow-up on file.  Simmie Camerer A Terree Gaultney, PA-C

## 2017-11-14 ENCOUNTER — Encounter: Payer: Self-pay | Admitting: *Deleted

## 2017-11-14 ENCOUNTER — Ambulatory Visit
Admission: EM | Admit: 2017-11-14 | Discharge: 2017-11-14 | Disposition: A | Discharge status: Home / Self Care | Payer: Medicare Other | Attending: Family Medicine | Admitting: Family Medicine

## 2017-11-14 DIAGNOSIS — J069 Acute upper respiratory infection, unspecified: Secondary | ICD-10-CM

## 2017-11-14 DIAGNOSIS — J45901 Unspecified asthma with (acute) exacerbation: Secondary | ICD-10-CM

## 2017-11-14 DIAGNOSIS — B9789 Other viral agents as the cause of diseases classified elsewhere: Secondary | ICD-10-CM

## 2017-11-14 MED ORDER — PREDNISONE 20 MG PO TABS: 40.0000 mg | ORAL_TABLET | Freq: Every day | ORAL | 0 refills | Status: DC

## 2017-11-14 MED ORDER — HYDROCOD POLST-CPM POLST ER 10-8 MG/5ML PO SUER: 2.5000 mL | Freq: Every evening | ORAL | 0 refills | Status: DC | PRN

## 2017-11-14 MED ORDER — BENZONATATE 100 MG PO CAPS: 100.0000 mg | ORAL_CAPSULE | Freq: Three times a day (TID) | ORAL | 0 refills | Status: DC | PRN

## 2017-11-14 NOTE — Discharge Instructions (Addendum)
Take medication as prescribed. Rest. Drink plenty of fluids. Use inhaler as discussed.   Follow up with your primary care physician this week as scheduled.  Return to Urgent care for new or worsening concerns.

## 2017-11-14 NOTE — ED Triage Notes (Signed)
Non-productive cough, dyspnea, fever, chest congestion and soreness, chills since last Friday.

## 2017-11-14 NOTE — ED Provider Notes (Signed)
[facility] ____________________________________________  Time seen: Approximately 12:00 PM  I have reviewed the triage vital signs and the nursing notes.   HISTORY  Chief Complaint Cough; Fever; Chills; and Shortness of Breath  HPI Ariana Guzman is a 77 y.o. female presenting for evaluation of nasal congestion, cough, postnasal drainage, some sinus pressure sensation has been present for the last 3 days.  States Saturday had some hot and cold chills and possible fevers, no known fevers.  Denies any other fever-like sensation.  Denies body aches.  States cough was initially disrupting sleep, not as much last night.  Reports does have a history of seasonal allergies, and states noticed symptoms worsen after sitting outside with her son on Friday eating outside.  Continues to take daily Claritin.  States that the last 2 days she has felt occasional tightness in her chest consistent with her asthma and asthma exacerbations with occasional wheezing.  Last use home nebulizer on Saturday.  Denies any current chest pain or shortness of breath.  Denies hemoptysis.  No recent sick contacts.  Continues to overall eat and drink well, slight decrease in appetite.  States some greenish nasal drainage.  Reports felt fine prior to Friday.  Denies other aggravating or alleviating factors. Denies chest pain, shortness of breath, abdominal pain, dysuria, or rash. Denies recent sickness. Denies recent antibiotic use.   PCP: Carolin Coy new appt next week   Past Medical History:  Diagnosis Date  . Asthma   . Atrial fibrillation ([place])   . Hypertension     Patient Active Problem List   Diagnosis Date Noted  . Essential hypertension 09/09/2017  . 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 Bilateral lower extremity edema 09/09/2017  . Pain in both lower extremities 09/09/2017    Past Surgical History:    Procedure Laterality Date  . ABDOMINAL HYSTERECTOMY    . TOE SURGERY       No current facility-administered medications for this encounter.   Current Outpatient Medications:  .  albuterol (PROVENTIL HFA;VENTOLIN HFA) 108 (90 Base) MCG/ACT inhaler, Inhale 2 puffs into the lungs every 4 (four) hours as needed for wheezing., Disp: 1 Inhaler, Rfl: 0 .  Ascorbic Acid (VITAMIN C) 1000 MG tablet, Take 1,000 mg by mouth daily., Disp: , Rfl:  .  calcium carbonate (OSCAL) 1500 (600 Ca) MG TABS tablet, Take by mouth 2 (two) times daily with a meal., Disp: , Rfl:  .  cholecalciferol (VITAMIN D) 1000 units tablet, Take 1,000 Units by mouth daily., Disp: , Rfl:  .  levothyroxine (SYNTHROID, LEVOTHROID) 75 MCG tablet, Take 75 mcg by mouth daily before breakfast., Disp: , Rfl:  .  losartan (COZAAR) 25 MG tablet, Take 25 mg by mouth daily., Disp: , Rfl:  .  Magnesium (MAGNACAPS) 100 MG CAPS, Take by mouth., Disp: , Rfl:  .  metoprolol tartrate (LOPRESSOR) 25 MG tablet, Take 25 mg by mouth 2 (two) times daily., Disp: , Rfl:  .  multivitamin-lutein (OCUVITE-LUTEIN) CAPS capsule, Take 1 capsule by mouth daily., Disp: , Rfl:  .  Rivaroxaban (XARELTO) 15 MG TABS tablet, Take 15 mg by mouth 2 (two) times daily with a meal., Disp: , Rfl:  .  benzonatate (TESSALON PERLES) 100 MG capsule, Take 1 capsule (100 mg total) by mouth 3 (three) times daily as needed for cough., Disp: 15 capsule, Rfl: 0 .  cetirizine (ZYRTEC) 10 MG tablet, Take 10  mg by mouth daily., Disp: , Rfl:  .  Fluticasone-Salmeterol (ADVAIR) 250-50 MCG/DOSE AEPB, Inhale 1 puff into the lungs 2 (two) times daily., Disp: , Rfl:  .  latanoprost (XALATAN) 0.005 % ophthalmic solution, 1 drop at bedtime., Disp: , Rfl:  .  Omega-3 Fatty Acids (FISH OIL) 1000 MG CAPS, Take by mouth., Disp: , Rfl:  .  predniSONE (DELTASONE) 20 MG tablet, Take 2 tablets (40 mg total) by mouth daily., Disp: 10 tablet, Rfl: 0  Allergies Tramadol and Meloxicam  Family History   Problem Relation Age of Onset  . Breast cancer Other 56  . Dementia Mother   . Cancer Father     Social History Social History   Tobacco Use  . Smoking status: Former Smoker    Last attempt to quit: 09/28/1989    Years since quitting: 28.1  . Smokeless tobacco: Never Used  Substance Use Topics  . Alcohol use: No    Frequency: Never  . Drug use: No    Review of Systems Constitutional: As above.  ENT: No sore throat. As above.  Cardiovascular: Denies chest pain. Respiratory: Denies shortness of breath. Gastrointestinal: No abdominal pain.  No nausea, no vomiting.  No diarrhea.  No constipation. Genitourinary: Negative for dysuria. Musculoskeletal: Negative for back pain. Skin: Negative for rash.  ____________________________________________   PHYSICAL EXAM:  VITAL SIGNS: ED Triage Vitals  Enc Vitals Group     BP 11/14/17 1105 (!) 171/72     Pulse Rate 11/14/17 1105 88     Resp 11/14/17 1105 16     Temp 11/14/17 1105 98.7 F (37.1 C)     Temp Source 11/14/17 1105 Oral     SpO2 11/14/17 1105 98 %     Weight 11/14/17 1108 196 lb (88.9 kg)     Height 11/14/17 1108 5\' 7"  (1.702 m)     Head Circumference --      Peak Flow --      Pain Score 11/14/17 1107 0     Pain Loc --      Pain Edu? --      Excl. in [place]? --    Constitutional: Alert and oriented. Well appearing and in no acute distress. Eyes: Conjunctivae are normal.  Head: Atraumatic. Mild right frontal sinus tenderness palpation.  No maxillary sinus tenderness palpation. No swelling. No erythema.  Ears: no erythema, normal TMs bilaterally.   Nose:Nasal congestion   Mouth/Throat: Mucous membranes are moist. No pharyngeal erythema. No tonsillar swelling or exudate.  Neck: No stridor.  No cervical spine tenderness to palpation. Hematological/Lymphatic/Immunilogical: No cervical lymphadenopathy. Cardiovascular: Normal rate, regular rhythm. Grossly normal heart sounds.  Good peripheral circulation. Respiratory:  Normal respiratory effort.  No retractions.  No rales or rhonchi.  Minimal scattered expiratory wheezes, improved with cough. Occasional dry cough noted in room with mild bronchospasm. Speaks in complete sentences.  Good air movement.  Gastrointestinal: Soft and nontender.  Musculoskeletal: Ambulatory with steady gait.  Neurologic:  Normal speech and language. No gait instability. Skin:  Skin appears warm, dry and intact. No rash noted. Psychiatric: Mood and affect are normal. Speech and behavior are normal.  ___________________________________________   LABS (all labs ordered are listed, but only abnormal results are displayed)  Labs Reviewed - No data to display ____________________________________________   PROCEDURES Procedures   INITIAL IMPRESSION / ASSESSMENT AND PLAN / ED COURSE  Pertinent labs & imaging results that were available during my care of the patient were reviewed by me and considered  in my medical decision making (see chart for details).  Well-appearing patient.  No acute distress.  Suspect viral upper respiratory infection.  Very minimal wheezing noted, lungs otherwise clear throughout.  Suspect viral URI versus allergic rhinitis with mild asthma exacerbation.  Will treat with prednisone, continue home albuterol inhaler or nebulizer treatment as well as PRN Tessalon Perles.  Initially discussed use of Tussionex, however further discussed with patient patient not completely clear previous tramadol allergy, Rx canceled.  Encourage rest, fluids, supportive care discussed strict follow-up and return parameters.Discussed indication, risks and benefits of medications with patient.  Discussed follow up with Primary care physician this week. Discussed follow up and return parameters including no resolution or any worsening concerns. Patient verbalized understanding and agreed to plan.   ____________________________________________   FINAL CLINICAL IMPRESSION(S) / ED  DIAGNOSES  Final diagnoses:  Viral URI with cough  Mild asthma with acute exacerbation, unspecified whether persistent     ED Discharge Orders        Ordered         predniSONE (DELTASONE) 20 MG tablet  Daily     11/14/17 1158    benzonatate (TESSALON PERLES) 100 MG capsule  3 times daily PRN     11/14/17 1158       Note: This dictation was prepared with Dragon dictation along with smaller phrase technology. Any transcriptional errors that result from this process are unintentional.         Marylene Land, NP 11/14/17 1223

## 2017-11-20 ENCOUNTER — Encounter: Payer: Self-pay | Admitting: Internal Medicine

## 2017-11-20 ENCOUNTER — Other Ambulatory Visit: Payer: Self-pay | Admitting: Internal Medicine

## 2017-11-20 DIAGNOSIS — F5104 Psychophysiologic insomnia: Secondary | ICD-10-CM

## 2017-11-21 ENCOUNTER — Encounter (INDEPENDENT_AMBULATORY_CARE_PROVIDER_SITE_OTHER): Payer: Self-pay | Admitting: Vascular Surgery

## 2017-11-21 ENCOUNTER — Ambulatory Visit (INDEPENDENT_AMBULATORY_CARE_PROVIDER_SITE_OTHER): Payer: Medicare Other | Admitting: Vascular Surgery

## 2017-11-21 VITALS — BP 153/83 | HR 88 | Resp 16 | Ht 66.0 in | Wt 202.0 lb

## 2017-11-21 DIAGNOSIS — I83891 Varicose veins of right lower extremities with other complications: Secondary | ICD-10-CM

## 2017-11-21 DIAGNOSIS — I83813 Varicose veins of bilateral lower extremities with pain: Secondary | ICD-10-CM

## 2017-11-21 NOTE — Progress Notes (Signed)
Varicose veins of right  lower extremity with inflammation (454.1  I83.10) Current Plans   Indication: Patient presents with symptomatic varicose veins of the right  lower extremity.   Procedure: Sclerotherapy using hypertonic saline mixed with 1% Lidocaine was performed on the right lower extremity. Compression wraps were placed. The patient tolerated the procedure well. 

## 2017-11-22 ENCOUNTER — Ambulatory Visit: Payer: Medicare Other | Admitting: Internal Medicine

## 2017-11-22 ENCOUNTER — Other Ambulatory Visit: Payer: Self-pay

## 2017-11-22 VITALS — BP 128/70 | HR 87 | Ht 67.0 in | Wt 204.0 lb

## 2017-11-22 DIAGNOSIS — Z7901 Long term (current) use of anticoagulants: Secondary | ICD-10-CM

## 2017-11-22 DIAGNOSIS — G4733 Obstructive sleep apnea (adult) (pediatric): Secondary | ICD-10-CM

## 2017-11-22 DIAGNOSIS — E039 Hypothyroidism, unspecified: Secondary | ICD-10-CM

## 2017-11-22 DIAGNOSIS — J45998 Other asthma: Secondary | ICD-10-CM | POA: Diagnosis not present

## 2017-11-22 DIAGNOSIS — F5104 Psychophysiologic insomnia: Secondary | ICD-10-CM

## 2017-11-22 DIAGNOSIS — I482 Chronic atrial fibrillation, unspecified: Secondary | ICD-10-CM

## 2017-11-22 DIAGNOSIS — I83813 Varicose veins of bilateral lower extremities with pain: Secondary | ICD-10-CM | POA: Diagnosis not present

## 2017-11-22 DIAGNOSIS — I10 Essential (primary) hypertension: Secondary | ICD-10-CM | POA: Diagnosis not present

## 2017-11-22 NOTE — Progress Notes (Signed)
Date:  11/22/2017   Name:  Ariana Guzman   DOB:  July 25, 1941   MRN:  [id number]   Chief Complaint: Establish Care and Insomnia (Taking OTC L-Theanine 200 mg, and Zinc Magnesium 351 mg.) [place]  She has several specialists which she sees but was not satisfied with her [facility] PCP. Insomnia  Primary symptoms: sleep disturbance.  The problem has been gradually improving since onset. The symptoms are relieved by medication. The treatment provided mild (recently weaned off of Xanax and started several herbal therapies) relief.  Hypertension  This is a chronic problem. The problem is unchanged. The problem is controlled. Associated symptoms include shortness of breath. Pertinent negatives include no chest pain, headaches or palpitations. Past treatments include angiotensin blockers and diuretics. Identifiable causes of hypertension include a thyroid problem.  Thyroid Problem  Presents for follow-up visit. Patient reports no anxiety, constipation, diarrhea, fatigue, hoarse voice, leg swelling, palpitations, weight gain or weight loss. The symptoms have been stable.  Asthma  She complains of shortness of breath and wheezing. There is no cough or hoarse voice. This is a recurrent problem. The problem occurs intermittently. Pertinent negatives include no chest pain, fever, headaches, myalgias or weight loss. Her symptoms are aggravated by pollen, strenuous activity and URI. Her symptoms are alleviated by beta-agonist and steroid inhaler. She reports significant improvement on treatment. Her past medical history is significant for asthma.  Atrial Fibrillation - chronic for a while, she has had cardioversion which failed.  She has decided to leave it alone and continues on eliquis.  She denies bleeding issues. 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    Review of Systems  Constitutional: Negative for chills, fatigue, fever, weight gain and weight  loss.  HENT: Negative for hoarse voice.   Respiratory: Positive for shortness of breath and wheezing. Negative for cough and chest tightness.   Cardiovascular: Positive for leg swelling (varicose veins). Negative for chest pain and palpitations.  Gastrointestinal: Negative for abdominal pain, constipation and diarrhea.  Musculoskeletal: Positive for arthralgias. Negative for gait problem, joint swelling and myalgias.  Neurological: Negative for dizziness, numbness and headaches.  Hematological: Negative for adenopathy.  Psychiatric/Behavioral: Positive for sleep disturbance. Negative for dysphoric mood. The patient has insomnia. The patient is not nervous/anxious.     Patient Active Problem List   Diagnosis Date Noted  . Chronic insomnia 09/15/2017  . Essential hypertension 09/09/2017  . 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 Bilateral lower extremity edema 09/09/2017  . Pain in both lower extremities 09/09/2017  . Current use of long term anticoagulation 07/28/2017  . 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 Otitis externa, chronic 12/03/2015  . Obstructive sleep apnea 10/07/2015  . Asthma, persistent controlled 09/25/2015  . Acquired hypothyroidism 02/29/2012  . Chronic atrial fibrillation ([place]) 02/29/2012  . Hyperlipidemia 02/29/2012    Prior to Admission medications   Medication Sig Start Date End Date Taking? Authorizing Provider  albuterol (PROVENTIL) (2.5 MG/3ML) 0.083% nebulizer solution Inhale into the lungs. 04/11/17 04/11/18 Yes [provider]  Ascorbic Acid (VITAMIN C) 1000 MG tablet Take 1,000 mg by mouth daily.   Yes [provider]  calcium carbonate (OSCAL) 1500 (600 Ca) MG TABS tablet Take by mouth 2 (two) times daily with a meal.   Yes [provider]  cetirizine (ZYRTEC) 10 MG tablet  Take 10 mg by mouth daily.   Yes  [provider]  cholecalciferol (VITAMIN D) 1000 units tablet Take 1,000 Units by mouth daily.   Yes [provider]  Fluticasone-Salmeterol (ADVAIR) 250-50 MCG/DOSE AEPB Inhale 1 puff into the lungs 2 (two) times daily.   Yes [provider]  hydrochlorothiazide (HYDRODIURIL) 25 MG tablet Take 25 mg by mouth daily.  10/10/17  Yes [provider]  latanoprost (XALATAN) 0.005 % ophthalmic solution 1 drop at bedtime.   Yes [provider]  levothyroxine (SYNTHROID, LEVOTHROID) 75 MCG tablet Take 75 mcg by mouth daily before breakfast.   Yes [provider]  losartan (COZAAR) 25 MG tablet Take 25 mg by mouth daily.   Yes [provider]  Magnesium (MAGNACAPS) 100 MG CAPS Take 250 mg by mouth daily.    Yes [provider]  MAGNESIUM-ZINC PO Take 351 mg by mouth at bedtime as needed.   Yes [provider]  Melatonin-Theanine (MELATONIN FORTE/L-THEANINE PO) Take 200 mg by mouth at bedtime as needed.   Yes [provider]  metoprolol tartrate (LOPRESSOR) 25 MG tablet Take 25 mg by mouth 2 (two) times daily.   Yes [provider]  multivitamin-lutein (OCUVITE-LUTEIN) CAPS capsule Take 1 capsule by mouth daily.   Yes [provider]  Rivaroxaban (XARELTO) 15 MG TABS tablet Take 15 mg by mouth 2 (two) times daily with a meal.   Yes [provider]    Allergies  Allergen Reactions  . Tramadol Shortness Of Breath  . Meloxicam Rash    Past Surgical History:  Procedure Laterality Date  . ABDOMINAL HYSTERECTOMY  1991   uterus only- still has cervix  . TOE SURGERY      Social History   Tobacco Use  . Smoking status: Former Smoker    Last attempt to quit: 09/28/1989    Years since quitting: 28.1  . Smokeless tobacco: Never Used  Substance Use Topics  . Alcohol use: No    Frequency: Never  . Drug use: No     Medication list has been reviewed and updated.  PHQ 2/9 Scores  11/22/2017  PHQ - 2 Score 0    Physical Exam  Constitutional: She is oriented to person, place, and time. She appears well-developed.  HENT:  Head: Normocephalic and atraumatic.  Cardiovascular: Normal rate, normal heart sounds and normal pulses. An irregularly irregular rhythm present.  Pulmonary/Chest: Effort normal. No respiratory distress. She has decreased breath sounds. She has no wheezes. She has no rhonchi.  Musculoskeletal: Normal range of motion.  Neurological: She is alert and oriented to person, place, and time. She has normal reflexes. No sensory deficit. Gait normal.  Skin: Skin is warm and dry. No rash noted.  Psychiatric: She has a normal mood and affect. Her speech is normal and behavior is normal. Thought content normal. Cognition and memory are normal.  Nursing note and vitals reviewed.   BP 128/70   Pulse 87   Ht 5\' 7"  (1.702 m)   Wt 204 lb (92.5 kg)   SpO2 97%   BMI 31.95 kg/m   Assessment and Plan: 1. Essential hypertension controlled  2. Chronic atrial fibrillation (HCC) Rate controlled On DOAC and followed by Cardiology  3. Obstructive sleep apnea On CPAP Followed by Pulmnary  4. Asthma, persistent controlled Stable, mild exacerbation with pollen count elevations  5. Varicose veins of both lower extremities with pain Seeing [facility] for treatment  6. Current use of long term anticoagulation  No bleeding issues  7. Acquired hypothyroidism supplemented  8. Chronic insomnia Weaned off of xanax Improving on Mag-zinc and Melatonin-theanine   No orders of the defined types were placed in this encounter.   Partially dictated using Editor, commissioning. Any errors are unintentional.  Halina Maidens, MD [facility]  11/22/2017

## 2017-11-29 ENCOUNTER — Ambulatory Visit (INDEPENDENT_AMBULATORY_CARE_PROVIDER_SITE_OTHER): Payer: Medicare Other

## 2017-11-29 ENCOUNTER — Other Ambulatory Visit: Payer: Self-pay

## 2017-11-29 ENCOUNTER — Ambulatory Visit
Admission: EM | Admit: 2017-11-29 | Discharge: 2017-11-29 | Disposition: A | Discharge status: Home / Self Care | Payer: Medicare Other | Attending: Family Medicine | Admitting: Family Medicine

## 2017-11-29 DIAGNOSIS — R0602 Shortness of breath: Secondary | ICD-10-CM | POA: Diagnosis not present

## 2017-11-29 DIAGNOSIS — J189 Pneumonia, unspecified organism: Secondary | ICD-10-CM

## 2017-11-29 DIAGNOSIS — J181 Lobar pneumonia, unspecified organism: Secondary | ICD-10-CM | POA: Diagnosis not present

## 2017-11-29 DIAGNOSIS — R05 Cough: Secondary | ICD-10-CM

## 2017-11-29 MED ORDER — BENZONATATE 200 MG PO CAPS: 200.0000 mg | ORAL_CAPSULE | Freq: Three times a day (TID) | ORAL | 0 refills | Status: DC | PRN

## 2017-11-29 MED ORDER — DOXYCYCLINE HYCLATE 100 MG PO TABS: 100.0000 mg | ORAL_TABLET | Freq: Two times a day (BID) | ORAL | 0 refills | Status: DC

## 2017-11-29 NOTE — ED Provider Notes (Signed)
[facility]    CSN: [id number] Arrival date & time: 11/29/17  4580     History   Chief Complaint Chief Complaint  Patient presents with  . Cough    HPI Ariana Guzman is a 77 y.o. female.    Cough  Cough characteristics:  Non-productive Severity:  Moderate Onset quality:  Sudden Duration:  6 weeks Timing:  Constant Progression:  Unchanged Chronicity:  New Smoker: no   Context: not animal exposure, not exposure to allergens, not fumes, not occupational exposure, not sick contacts, not smoke exposure, not upper respiratory infection, not weather changes and not with activity   Relieved by:  Nothing Ineffective treatments:  Cough suppressants and beta-agonist inhaler Associated symptoms: chills, shortness of breath and wheezing   Associated symptoms: no chest pain, no diaphoresis, no ear fullness, no ear pain, no eye discharge, no fever, no headaches, no myalgias, no rash, no rhinorrhea, no sinus congestion, no sore throat and no weight loss     Past Medical History:  Diagnosis Date  . Asthma   . Atrial fibrillation ([place])   . Hypertension     Patient Active Problem List   Diagnosis Date Noted  . Chronic insomnia 09/15/2017  . Essential hypertension 09/09/2017  . 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 Bilateral lower extremity edema 09/09/2017  . Pain in both lower extremities 09/09/2017  . Current use of long term anticoagulation 07/28/2017  . 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 Otitis externa, chronic 12/03/2015  . Obstructive sleep apnea 10/07/2015  . Asthma, persistent controlled 09/25/2015  . Acquired hypothyroidism 02/29/2012  . Chronic atrial fibrillation ([place]) 02/29/2012  . Hyperlipidemia 02/29/2012    Past Surgical History:  Procedure Laterality Date  . ABDOMINAL HYSTERECTOMY  1991   uterus only- still has cervix - done for prolapse  . COLONOSCOPY  2009   normal  . TOE SURGERY        OB History   None      Home Medications    Prior to Admission medications   Medication Sig Start Date End Date Taking? Authorizing Provider  albuterol (PROVENTIL) (2.5 MG/3ML) 0.083% nebulizer solution Inhale into the lungs. 04/11/17 04/11/18  [provider]  Ascorbic Acid (VITAMIN C) 1000 MG tablet Take 1,000 mg by mouth daily.    [provider]  benzonatate (TESSALON) 200 MG capsule Take 1 capsule (200 mg total) by mouth 3 (three) times daily as needed. 11/29/17   Norval Gable, MD  calcium carbonate (OSCAL) 1500 (600 Ca) MG TABS tablet Take by mouth 2 (two) times daily with a meal.    [provider]  cetirizine (ZYRTEC) 10 MG tablet Take 10 mg by mouth daily.    [provider]  cholecalciferol (VITAMIN D) 1000 units tablet Take 1,000 Units by mouth daily.    [provider]  doxycycline (VIBRA-TABS) 100 MG tablet Take 1 tablet (100 mg total) by mouth 2 (two) times daily. 11/29/17   Norval Gable, MD  Fluticasone-Salmeterol (ADVAIR) 250-50 MCG/DOSE AEPB Inhale 1 puff into the lungs 2 (two) times daily.    [provider]  hydrochlorothiazide (HYDRODIURIL) 25 MG tablet Take 25 mg by mouth daily.  10/10/17   [provider]  latanoprost (XALATAN) 0.005 % ophthalmic solution 1 drop at bedtime.    [provider]  levothyroxine (SYNTHROID, LEVOTHROID) 75 MCG tablet Take 75 mcg by mouth daily before breakfast.    [provider]  losartan (  COZAAR) 25 MG tablet Take 25 mg by mouth daily.    [provider]  Magnesium (MAGNACAPS) 100 MG CAPS Take 250 mg by mouth daily.     [provider]  MAGNESIUM-ZINC PO Take 351 mg by mouth at bedtime as needed.    [provider]  Melatonin-Theanine (MELATONIN FORTE/L-THEANINE PO) Take 200 mg by mouth at bedtime as needed.    [provider]  metoprolol tartrate (LOPRESSOR) 25 MG tablet Take 25 mg by mouth 2 (two) times daily.     [provider]  multivitamin-lutein (OCUVITE-LUTEIN) CAPS capsule Take 1 capsule by mouth daily.    [provider]  Rivaroxaban (XARELTO) 15 MG TABS tablet Take 15 mg by mouth 2 (two) times daily with a meal.    [provider]    Family History Family History  Problem Relation Age of Onset  . Breast cancer Other 56  . Dementia Mother   . Cancer Father     Social History Social History   Tobacco Use  . Smoking status: Former Smoker    Types: Cigarettes    Last attempt to quit: 09/28/1989    Years since quitting: 28.1  . Smokeless tobacco: Never Used  Substance Use Topics  . Alcohol use: No    Frequency: Never  . Drug use: No     Allergies   Tramadol and Meloxicam   Review of Systems Review of Systems  Constitutional: Positive for chills. Negative for diaphoresis, fever and weight loss.  HENT: Negative for ear pain, rhinorrhea and sore throat.   Eyes: Negative for discharge.  Respiratory: Positive for cough, shortness of breath and wheezing.   Cardiovascular: Negative for chest pain.  Musculoskeletal: Negative for myalgias.  Skin: Negative for rash.  Neurological: Negative for headaches.     Physical Exam Triage Vital Signs ED Triage Vitals  Enc Vitals Group     BP 11/29/17 1000 126/69     Pulse Rate 11/29/17 1000 84     Resp 11/29/17 1000 18     Temp 11/29/17 1000 98.2 F (36.8 C)     Temp Source 11/29/17 1000 Oral     SpO2 11/29/17 1000 98 %     Weight 11/29/17 1002 202 lb (91.6 kg)     Height 11/29/17 1002 5\' 7"  (1.702 m)     Head Circumference --      Peak Flow --      Pain Score 11/29/17 1056 4     Pain Loc --      Pain Edu? --      Excl. in [place]? --    No data found.  Updated Vital Signs BP 126/69 (BP Location: Right Arm)   Pulse 84   Temp 98.2 F (36.8 C) (Oral)   Resp 18   Ht 5\' 7"  (1.702 m)   Wt 202 lb (91.6 kg)   SpO2 98%   BMI 31.64 kg/m   Visual Acuity Right Eye Distance:   Left Eye Distance:     Bilateral Distance:    Right Eye Near:   Left Eye Near:    Bilateral Near:     Physical Exam  Constitutional: She appears well-developed and well-nourished. No distress.  HENT:  Head: Normocephalic and atraumatic.  Mouth/Throat: Uvula is midline, oropharynx is clear and moist and mucous membranes are normal. No oropharyngeal exudate.  Eyes: Right eye exhibits no discharge. Left eye exhibits no discharge. No scleral icterus.  Neck: Normal range of motion. Neck supple.  No tracheal deviation present. No thyromegaly present.  Cardiovascular: Normal rate, regular rhythm and normal heart sounds.  Pulmonary/Chest: Effort normal. No stridor. No respiratory distress. She has wheezes (few expiratory and basilar rhonchi). She has no rales.  Lymphadenopathy:    She has no cervical adenopathy.  Skin: She is not diaphoretic.  Nursing note and vitals reviewed.    UC Treatments / Results  Labs (all labs ordered are listed, but only abnormal results are displayed) Labs Reviewed - No data to display  EKG None Radiology Dg Chest 2 View  Result Date: 11/29/2017 CLINICAL DATA:  Chronic cough EXAM: CHEST - 2 VIEW COMPARISON:  None. FINDINGS: There is hyperinflation of the lungs compatible with COPD. Mild cardiomegaly. Left base density could reflect early infiltrate or scarring. No effusions. No acute bony abnormality. IMPRESSION: COPD, mild cardiomegaly. Left basilar opacity could reflect scarring or infiltrate/pneumonia. Electronically Signed   By: Rolm Baptise M.D.   On: 11/29/2017 10:26    Procedures Procedures (including critical care time)  Medications Ordered in UC Medications - No data to display   Initial Impression / Assessment and Plan / UC Course  I have reviewed the triage vital signs and the nursing notes.  Pertinent labs & imaging results that were available during my care of the patient were reviewed by me and considered in my medical decision making (see chart for  details).       Final Clinical Impressions(s) / UC Diagnoses   Final diagnoses:  Community acquired pneumonia of left lower lobe of lung [facility])    ED Discharge Orders        Ordered    doxycycline (VIBRA-TABS) 100 MG tablet  2 times daily     11/29/17 1052    benzonatate (TESSALON) 200 MG capsule  3 times daily PRN     11/29/17 1053     1. x-ray results and diagnosis reviewed with patient 2. rx as per orders above; reviewed possible side effects, interactions, risks and benefits  3. Recommend continue nebulizer treatments (increase to 4 times daily)  4. Follow-up prn if symptoms worsen or don't improve  Controlled Substance Prescriptions [place] Controlled Substance Registry consulted? Not Applicable   Norval Gable, MD 11/29/17 1154

## 2017-11-29 NOTE — ED Triage Notes (Addendum)
Pt reports "I saw Mendel Ryder in February for cough and it has never completely gone away." Non-productive cough and chest tightness. Reports she gets SOB when walking

## 2017-12-06 ENCOUNTER — Telehealth (INDEPENDENT_AMBULATORY_CARE_PROVIDER_SITE_OTHER): Payer: Self-pay

## 2017-12-17 ENCOUNTER — Other Ambulatory Visit: Payer: Self-pay | Admitting: Internal Medicine

## 2017-12-19 ENCOUNTER — Ambulatory Visit (INDEPENDENT_AMBULATORY_CARE_PROVIDER_SITE_OTHER): Payer: Medicare Other | Admitting: Vascular Surgery

## 2017-12-19 ENCOUNTER — Encounter (INDEPENDENT_AMBULATORY_CARE_PROVIDER_SITE_OTHER): Payer: Self-pay | Admitting: Vascular Surgery

## 2017-12-19 VITALS — BP 142/74 | HR 60 | Resp 15 | Ht 65.5 in | Wt 210.0 lb

## 2017-12-19 DIAGNOSIS — I83811 Varicose veins of right lower extremities with pain: Secondary | ICD-10-CM | POA: Diagnosis not present

## 2017-12-19 DIAGNOSIS — I83813 Varicose veins of bilateral lower extremities with pain: Secondary | ICD-10-CM

## 2017-12-19 NOTE — Progress Notes (Signed)
Varicose veins of right  lower extremity with inflammation (454.1  I83.10) Current Plans   Indication: Patient presents with symptomatic varicose veins of the right  lower extremity.   Procedure: Sclerotherapy using hypertonic saline mixed with 1% Lidocaine was performed on the right lower extremity. Compression wraps were placed. The patient tolerated the procedure well. 

## 2018-01-30 ENCOUNTER — Ambulatory Visit (INDEPENDENT_AMBULATORY_CARE_PROVIDER_SITE_OTHER): Payer: Medicare Other | Admitting: Vascular Surgery

## 2018-02-04 ENCOUNTER — Other Ambulatory Visit: Payer: Self-pay

## 2018-02-04 ENCOUNTER — Ambulatory Visit
Admission: EM | Admit: 2018-02-04 | Discharge: 2018-02-04 | Disposition: A | Discharge status: Home / Self Care | Payer: Medicare Other | Attending: Emergency Medicine | Admitting: Emergency Medicine

## 2018-02-04 ENCOUNTER — Ambulatory Visit (INDEPENDENT_AMBULATORY_CARE_PROVIDER_SITE_OTHER): Payer: Medicare Other

## 2018-02-04 DIAGNOSIS — S63502A Unspecified sprain of left wrist, initial encounter: Secondary | ICD-10-CM

## 2018-02-04 MED ORDER — PREDNISONE 20 MG PO TABS: ORAL_TABLET | ORAL | 0 refills | Status: DC

## 2018-02-04 NOTE — ED Notes (Signed)
Velcro wrist splint applied.

## 2018-02-04 NOTE — ED Provider Notes (Signed)
[facility]    CSN: [id number] Arrival date & time: 02/04/18  1348     History   Chief Complaint Chief Complaint  Patient presents with  . Wrist Pain    HPI Ariana Guzman is a 77 y.o. female.   HPI  77 year old female presents with left nondominant wrist pain and hand pain that she is having for the last few days.  She has had no numbness or tingling but has noticed some swelling volarly.  Most of her pain seems to be on the volar aspect radiating into the thenar eminence but also over the dorsum of the hand as well.  She has no known injury although she does admit to doing increased housework recently since she has moved into a new home and has been cleaning closets and straightening out things lifting pots and pans etc.  She has known arthritis.  She states that she is having difficulty making a fist and has been unable to move her thumb fully particularly into extension.       Past Medical History:  Diagnosis Date  . Asthma   . Atrial fibrillation ([place])   . Hypertension     Patient Active Problem List   Diagnosis Date Noted  . Chronic insomnia 09/15/2017  . Essential hypertension 09/09/2017  . 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 Bilateral lower extremity edema 09/09/2017  . Pain in both lower extremities 09/09/2017  . Current use of long term anticoagulation 07/28/2017  . 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 Otitis externa, chronic 12/03/2015  . Obstructive sleep apnea 10/07/2015  . Asthma, persistent controlled 09/25/2015  . Acquired hypothyroidism 02/29/2012  . Chronic atrial fibrillation ([place]) 02/29/2012  . Hyperlipidemia 02/29/2012    Past Surgical History:  Procedure Laterality Date  . ABDOMINAL HYSTERECTOMY  1991   uterus only- still has cervix - done for prolapse  . COLONOSCOPY  2009   normal  . TOE SURGERY      OB History   None      Home Medications    Prior to  Admission medications   Medication Sig Start Date End Date Taking? Authorizing Provider  albuterol (PROVENTIL) (2.5 MG/3ML) 0.083% nebulizer solution Inhale into the lungs. 04/11/17 04/11/18  [provider]  Ascorbic Acid (VITAMIN C) 1000 MG tablet Take 1,000 mg by mouth daily.    [provider]  calcium carbonate (OSCAL) 1500 (600 Ca) MG TABS tablet Take by mouth 2 (two) times daily with a meal.    [provider]  cholecalciferol (VITAMIN D) 1000 units tablet Take 1,000 Units by mouth daily.    [provider]  Fluticasone-Salmeterol (ADVAIR) 250-50 MCG/DOSE AEPB Inhale 1 puff into the lungs 2 (two) times daily.    [provider]  hydrochlorothiazide (HYDRODIURIL) 25 MG tablet Take 25 mg by mouth daily.  10/10/17   [provider]  latanoprost (XALATAN) 0.005 % ophthalmic solution 1 drop at bedtime.    [provider]  levothyroxine (SYNTHROID, LEVOTHROID) 75 MCG tablet Take 75 mcg by mouth daily before breakfast.    [provider]  losartan (COZAAR) 25 MG tablet Take 25 mg by mouth daily.    [provider]  MAGNESIUM-ZINC PO Take 351 mg by mouth at bedtime as needed.    [provider]  Melatonin-Theanine (MELATONIN FORTE/L-THEANINE PO) Take 200 mg by mouth at bedtime as needed.    [provider]  metoprolol tartrate (LOPRESSOR) 25  MG tablet Take 25 mg by mouth 2 (two) times daily.    [provider]  multivitamin-lutein (OCUVITE-LUTEIN) CAPS capsule Take 1 capsule by mouth daily.    [provider]  Omega-3 1000 MG CAPS Take by mouth.    [provider]  predniSONE (DELTASONE) 20 MG tablet Take 1 tablets (20 mg) daily by mouth 02/04/18   Lorin Picket, PA-C  Rivaroxaban (XARELTO) 15 MG TABS tablet Take 15 mg by mouth 2 (two) times daily with a meal.    [provider]    Family History Family History  Problem Relation Age of Onset  . Breast cancer  Other 56  . Dementia Mother   . Cancer Father     Social History Social History   Tobacco Use  . Smoking status: Former Smoker    Types: Cigarettes    Last attempt to quit: 09/28/1989    Years since quitting: 28.3  . Smokeless tobacco: Never Used  Substance Use Topics  . Alcohol use: No    Frequency: Never  . Drug use: No     Allergies   Tramadol and Meloxicam   Review of Systems Review of Systems  Constitutional: Positive for activity change. Negative for chills, fatigue and fever.  Musculoskeletal: Positive for arthralgias and myalgias.  All other systems reviewed and are negative.    Physical Exam Triage Vital Signs ED Triage Vitals  Enc Vitals Group     BP 02/04/18 1423 (!) 186/80     Pulse Rate 02/04/18 1423 68     Resp 02/04/18 1423 18     Temp 02/04/18 1423 98.5 F (36.9 C)     Temp Source 02/04/18 1423 Oral     SpO2 02/04/18 1423 98 %     Weight 02/04/18 1425 197 lb (89.4 kg)     Height 02/04/18 1425 5\' 7"  (1.702 m)     Head Circumference --      Peak Flow --      Pain Score 02/04/18 1424 8     Pain Loc --      Pain Edu? --      Excl. in [place]? --    No data found.  Updated Vital Signs BP (!) 186/80 (BP Location: Right Arm)   Pulse 68   Temp 98.5 F (36.9 C) (Oral)   Resp 18   Ht 5\' 7"  (1.702 m)   Wt 197 lb (89.4 kg)   SpO2 98%   BMI 30.85 kg/m   Visual Acuity Right Eye Distance:   Left Eye Distance:   Bilateral Distance:    Right Eye Near:   Left Eye Near:    Bilateral Near:     Physical Exam  Constitutional: She is oriented to person, place, and time. She appears well-developed and well-nourished. No distress.  HENT:  Head: Normocephalic.  Eyes: Pupils are equal, round, and reactive to light. Right eye exhibits no discharge. Left eye exhibits no discharge.  Neck: Normal range of motion.  Musculoskeletal: She exhibits edema and tenderness.  Emanation of the left nondominant hand and wrist shows mild swelling of the wrist over the  carpals on the volar aspect.  Actively the patient is able to flex her fingers fully.  Extends them fully.  Is a negative Tinel's at the wrist.  There is a negative Phalen's.  He has swelling of the first CM joint and mild tenderness there and over the MCP. of the thumb.  Tenderness is very diffuse.  Neurological:  She is alert and oriented to person, place, and time.  Skin: Skin is warm and dry. She is not diaphoretic.  Psychiatric: She has a normal mood and affect. Her behavior is normal. Judgment and thought content normal.  Nursing note and vitals reviewed.    UC Treatments / Results  Labs (all labs ordered are listed, but only abnormal results are displayed) Labs Reviewed - No data to display  EKG None  Radiology Dg Wrist Complete Left  Result Date: 02/04/2018 CLINICAL DATA:  77 y/o  F; left hand and wrist pain. EXAM: LEFT WRIST - COMPLETE 3+ VIEW; LEFT HAND - COMPLETE 3+ VIEW COMPARISON:  None. FINDINGS: Left wrist: There is no evidence of acute fracture or dislocation. Tiny chronic fracture of the ulnar styloid. There is no evidence of arthropathy or other focal bone abnormality. Soft tissues are unremarkable. Left hand: Mild osteoarthrosis of distal interphalangeal joints with loss of joint space and small periarticular osteophytes. Moderate osteoarthrosis of the basal joint with sclerosis of articular surfaces. Bones are demineralized. No acute fracture or dislocation. Normal radiocarpal alignment. Tiny chronic fracture of the ulnar styloid. No erosive changes. IMPRESSION: 1. No acute fracture or dislocation. 2. Tiny chronic fracture of the ulnar styloid. 3. Mild osteoarthrosis of distal interphalangeal joints and moderate osteoarthrosis of the basal joint. Electronically Signed   By: Kristine Garbe M.D.   On: 02/04/2018 17:10   Dg Hand Complete Left  Result Date: 02/04/2018 CLINICAL DATA:  77 y/o  F; left hand and wrist pain. EXAM: LEFT WRIST - COMPLETE 3+ VIEW; LEFT HAND -  COMPLETE 3+ VIEW COMPARISON:  None. FINDINGS: Left wrist: There is no evidence of acute fracture or dislocation. Tiny chronic fracture of the ulnar styloid. There is no evidence of arthropathy or other focal bone abnormality. Soft tissues are unremarkable. Left hand: Mild osteoarthrosis of distal interphalangeal joints with loss of joint space and small periarticular osteophytes. Moderate osteoarthrosis of the basal joint with sclerosis of articular surfaces. Bones are demineralized. No acute fracture or dislocation. Normal radiocarpal alignment. Tiny chronic fracture of the ulnar styloid. No erosive changes. IMPRESSION: 1. No acute fracture or dislocation. 2. Tiny chronic fracture of the ulnar styloid. 3. Mild osteoarthrosis of distal interphalangeal joints and moderate osteoarthrosis of the basal joint. Electronically Signed   By: Kristine Garbe M.D.   On: 02/04/2018 17:10    Procedures Procedures (including critical care time)  Medications Ordered in UC Medications - No data to display  Initial Impression / Assessment and Plan / UC Course  I have reviewed the triage vital signs and the nursing notes.  Pertinent labs & imaging results that were available during my care of the patient were reviewed by me and considered in my medical decision making (see chart for details).     Plan: 1. Test/x-ray results and diagnosis reviewed with patient 2. rx as per orders; risks, benefits, potential side effects reviewed with patient 3. Recommend supportive treatment with ice and heat as necessary.  Avoid symptoms and rest.Placed  Her in a splint that she will use every night for the next week and wean as tolerated.  I have also recommended that she use the splint for activities.  She is not able to take NSAIDS due to Xarelto use we will provide her with low-dose prednisone for 5 days to help assist with the pain and inflammation.  She is not improving she should follow-up with her primary care  physician. 4. F/u prn if symptoms worsen or don't  improve  Final Clinical Impressions(s) / UC Diagnoses   Final diagnoses:  Sprain of left wrist, initial encounter   Discharge Instructions   None    ED Prescriptions    Medication Sig Dispense Auth. Provider   predniSONE (DELTASONE) 20 MG tablet Take 1 tablets (20 mg) daily by mouth 5 tablet Lorin Picket, PA-C     Controlled Substance Prescriptions [place] Controlled Substance Registry consulted? Not Applicable   Lorin Picket, PA-C 02/04/18 1744

## 2018-02-04 NOTE — Discharge Instructions (Addendum)
Apply ice 20 minutes out of every 2 hours 4-5 times daily for comfort.  Wear splint every night for 1 week and then as necessary.  Also wear splint for any activity.  Follow-up with your primary care physician if you are not improving

## 2018-02-04 NOTE — ED Triage Notes (Signed)
Pt reports left wrist pain x past few days and some swelling. Can't make full fist. No injury.

## 2018-02-13 ENCOUNTER — Ambulatory Visit (INDEPENDENT_AMBULATORY_CARE_PROVIDER_SITE_OTHER): Payer: Medicare Other | Admitting: Vascular Surgery

## 2018-02-13 ENCOUNTER — Encounter (INDEPENDENT_AMBULATORY_CARE_PROVIDER_SITE_OTHER): Payer: Self-pay | Admitting: Vascular Surgery

## 2018-02-13 VITALS — BP 120/66 | HR 76 | Resp 16 | Ht 67.0 in | Wt 207.2 lb

## 2018-02-13 DIAGNOSIS — I83813 Varicose veins of bilateral lower extremities with pain: Secondary | ICD-10-CM

## 2018-02-13 DIAGNOSIS — I83811 Varicose veins of right lower extremities with pain: Secondary | ICD-10-CM

## 2018-02-13 NOTE — Progress Notes (Signed)
Varicose veins of right  lower extremity with inflammation (454.1  I83.10) Current Plans   Indication: Patient presents with symptomatic varicose veins of the right  lower extremity.   Procedure: Sclerotherapy using hypertonic saline mixed with 1% Lidocaine was performed on the right lower extremity. Compression wraps were placed. The patient tolerated the procedure well. 

## 2018-03-28 ENCOUNTER — Ambulatory Visit (INDEPENDENT_AMBULATORY_CARE_PROVIDER_SITE_OTHER): Payer: Medicare Other | Admitting: Vascular Surgery

## 2018-04-04 ENCOUNTER — Telehealth: Payer: Self-pay

## 2018-04-04 NOTE — Telephone Encounter (Signed)
Patient called stating she is out of her lasix 20 mg tablets and would like Rx to be called into pharmacy. She stated she got this Rx from her previous PCP a year ago and the prescription is now out. She only takes it as needed for ankle swelling. It is not currently on her medication list.   Please Advise.

## 2018-04-04 NOTE — Telephone Encounter (Signed)
I looked through her records and do not see it listed.  She already has HCTZ so I will not prescribe the lasix.

## 2018-04-05 ENCOUNTER — Other Ambulatory Visit: Payer: Self-pay | Admitting: Internal Medicine

## 2018-04-05 DIAGNOSIS — R600 Localized edema: Secondary | ICD-10-CM

## 2018-04-05 MED ORDER — FUROSEMIDE 20 MG PO TABS: 20.0000 mg | ORAL_TABLET | Freq: Every day | ORAL | 0 refills | Status: DC | PRN

## 2018-04-05 NOTE — Telephone Encounter (Signed)
Patient informed. 

## 2018-04-05 NOTE — Telephone Encounter (Signed)
Spoke with patient- she stated she only takes it as needed for ankle swelling along with HCTZ.  The bottle states Furosemide 20 mg Take as needed for swelling of the ankles. Filled 01/19/2017 with 3 refills before 01/19/2018. She stated she has not filled it since the first fill because she only uses it for ankle swelling. It is now expired and she cannot use refills. Please Advise.

## 2018-04-05 NOTE — Telephone Encounter (Signed)
Sent Rx to pharmacy  

## 2018-04-07 ENCOUNTER — Ambulatory Visit (INDEPENDENT_AMBULATORY_CARE_PROVIDER_SITE_OTHER): Payer: Medicare Other | Admitting: Vascular Surgery

## 2018-04-07 ENCOUNTER — Encounter (INDEPENDENT_AMBULATORY_CARE_PROVIDER_SITE_OTHER): Payer: Self-pay | Admitting: Vascular Surgery

## 2018-04-07 VITALS — BP 147/83 | HR 74 | Resp 12 | Ht 67.0 in | Wt 205.0 lb

## 2018-04-07 DIAGNOSIS — I83813 Varicose veins of bilateral lower extremities with pain: Secondary | ICD-10-CM

## 2018-04-07 DIAGNOSIS — I10 Essential (primary) hypertension: Secondary | ICD-10-CM | POA: Diagnosis not present

## 2018-04-07 NOTE — Assessment & Plan Note (Signed)
All questions were answered.  The patient now seems agreeable to performing bilateral great saphenous vein laser ablations in a staged fashion.  These will be scheduled at her convenience

## 2018-04-07 NOTE — Progress Notes (Signed)
MRN : [id number]  Ariana Guzman is a 77 y.o. (08/03/1941) female who presents with chief complaint of  Chief Complaint  Patient presents with  . Follow-up    Discuss laser ablation  .  History of Present Illness: Patient returns today in follow up of venous insufficiency.  She has questions before scheduling her laser she has had a major bleeding episode requiring sclerotherapy on the right leg.  She still has very prominent varicosities bilaterally.  She had a lot of questions regarding the procedure itself.  She has been wearing compression stockings for over 30 years.  She has a lot of pain and swelling in her legs.  Current Outpatient Medications  Medication Sig Dispense Refill  . albuterol (PROVENTIL) (2.5 MG/3ML) 0.083% nebulizer solution Inhale into the lungs.    . Ascorbic Acid (VITAMIN C) 1000 MG tablet Take 1,000 mg by mouth daily.    . Calcium Carb-Cholecalciferol [phone] MG-UNIT TABS Take by mouth.    . calcium carbonate (OSCAL) 1500 (600 Ca) MG TABS tablet Take by mouth 2 (two) times daily with a meal.    . cholecalciferol (VITAMIN D) 1000 units tablet Take 1,000 Units by mouth daily.    . Fluticasone-Salmeterol (ADVAIR) 250-50 MCG/DOSE AEPB Inhale 1 puff into the lungs 2 (two) times daily.    . furosemide (LASIX) 20 MG tablet Take 1 tablet (20 mg total) by mouth daily as needed. 30 tablet 0  . hydrochlorothiazide (HYDRODIURIL) 25 MG tablet Take 25 mg by mouth daily.     Marland Kitchen latanoprost (XALATAN) 0.005 % ophthalmic solution 1 drop at bedtime.    Marland Kitchen levothyroxine (SYNTHROID, LEVOTHROID) 75 MCG tablet Take 75 mcg by mouth daily before breakfast.    . losartan (COZAAR) 25 MG tablet Take 25 mg by mouth daily.    Marland Kitchen MAGNESIUM-ZINC PO Take 351 mg by mouth at bedtime as needed.    . Melatonin-Theanine (MELATONIN FORTE/L-THEANINE PO) Take 200 mg by mouth at bedtime as needed.    . metoprolol tartrate (LOPRESSOR) 25 MG tablet Take 25 mg by mouth 2 (two) times daily.    . Multiple  Vitamin (MULTI-VITAMINS) TABS Take by mouth.    . multivitamin-lutein (OCUVITE-LUTEIN) CAPS capsule Take 1 capsule by mouth daily.    . Omega-3 1000 MG CAPS Take by mouth.    . predniSONE (DELTASONE) 20 MG tablet Take 1 tablets (20 mg) daily by mouth 5 tablet 0  . Rivaroxaban (XARELTO) 15 MG TABS tablet Take 15 mg by mouth 2 (two) times daily with a meal.     No current facility-administered medications for this visit.     Past Medical History:  Diagnosis Date  . Asthma   . Atrial fibrillation ([place])   . Hypertension     Past Surgical History:  Procedure Laterality Date  . ABDOMINAL HYSTERECTOMY  1991   uterus only- still has cervix - done for prolapse  . COLONOSCOPY  2009   normal  . TOE SURGERY      Social History Social History   Tobacco Use  . Smoking status: Former Smoker    Types: Cigarettes    Last attempt to quit: 09/28/1989    Years since quitting: 28.5  . Smokeless tobacco: Never Used  Substance Use Topics  . Alcohol use: No    Frequency: Never  . Drug use: No     Family History Family History  Problem Relation Age of Onset  . Breast cancer Other 56  . Dementia Mother   .  Cancer Father      Allergies  Allergen Reactions  . Tramadol Shortness Of Breath  . Meloxicam Rash     REVIEW OF SYSTEMS (Negative unless checked)  Constitutional: [] Weight loss  [] Fever  [] Chills Cardiac: [] Chest pain   [] Chest pressure   [] Palpitations   [] Shortness of breath when laying flat   [] Shortness of breath at rest   [] Shortness of breath with exertion. Vascular:  [] Pain in legs with walking   [] Pain in legs at rest   [] Pain in legs when laying flat   [] Claudication   [] Pain in feet when walking  [] Pain in feet at rest  [] Pain in feet when laying flat   [] History of DVT   [] Phlebitis   [x] Swelling in legs   [x] Varicose veins   [] Non-healing ulcers Pulmonary:   [] Uses home oxygen   [] Productive cough   [] Hemoptysis   [] Wheeze  [] COPD   [] Asthma Neurologic:  [] Dizziness   [] Blackouts   [] Seizures   [] History of stroke   [] History of TIA  [] Aphasia   [] Temporary blindness   [] Dysphagia   [] Weakness or numbness in arms   [] Weakness or numbness in legs Musculoskeletal:  [x] Arthritis   [] Joint swelling   [x] Joint pain   [] Low back pain Hematologic:  [] Easy bruising  [] Easy bleeding   [] Hypercoagulable state   [] Anemic   Gastrointestinal:  [] Blood in stool   [] Vomiting blood  [x] Gastroesophageal reflux/heartburn   [] Abdominal pain Genitourinary:  [] Chronic kidney disease   [] Difficult urination  [] Frequent urination  [] Burning with urination   [] Hematuria Skin:  [] Rashes   [] Ulcers   [] Wounds Psychological:  [] History of anxiety   []  History of major depression.  Physical Examination  BP (!) 147/83 (BP Location: Right Arm, Patient Position: Sitting)   Pulse 74   Resp 12   Ht 5\' 7"  (1.702 m)   Wt 205 lb (93 kg)   BMI 32.11 kg/m  Gen:  WD/WN, NAD.  Appears younger than stated age Head: [place]/AT, No temporalis wasting. Ear/Nose/Throat: Hearing grossly intact, nares w/o erythema or drainage Eyes: Conjunctiva clear. Sclera non-icteric Neck: Supple.  Trachea midline Pulmonary:  Good air movement, no use of accessory muscles.  Cardiac: Irregularly irregular Vascular: Large medial leg varicosities are present bilaterally measuring over 5 mm in diameter. Vessel Right Left  Radial Palpable Palpable                          PT Palpable Palpable  DP Palpable Palpable    Musculoskeletal: M/S 5/5 throughout.  No deformity or atrophy.  Mild bilateral lower extremity edema. Neurologic: Sensation grossly intact in extremities.  Symmetrical.  Speech is fluent.  Psychiatric: Judgment intact, Mood & affect appropriate for pt's clinical situation. Dermatologic: No rashes or ulcers noted.  No cellulitis or open wounds.       Labs No results found for this or any previous visit (from the past 2160 hour(s)).  Radiology No results  found.  Assessment/Plan  Essential hypertension blood pressure control important in reducing the progression of atherosclerotic disease. On appropriate oral medications.   Varicose veins of both lower extremities with pain All questions were answered.  The patient now seems agreeable to performing bilateral great saphenous vein laser ablations in a staged fashion.  These will be scheduled at her [place], MD  04/07/2018 12:25 PM    This note was created with Dragon medical transcription system.  Any errors from dictation are  purely unintentional

## 2018-04-07 NOTE — Patient Instructions (Signed)
Nonsurgical Procedures for Varicose Veins, Care After This sheet gives you information about how to care for yourself after your procedure. Your health care provider may also give you more specific instructions. If you have problems or questions, contact your health care provider. What can I expect after the procedure? After the procedure, it is common to have:  Swelling.  Bruising.  Soreness.  Mild skin discoloration.  Slight bleeding at the incision sites.  Follow these instructions at home: Incision or puncture site care  Follow instructions from your health care provider about how to take care of your incision or puncture site. Make sure you: ? Wash your hands with soap and water before you change your bandage (dressing). If soap and water are not available, use hand sanitizer. ? Change your dressing as told by your health care provider. ? Leave skin glue or adhesive strips in place. These skin closures may need to stay in place for 2 weeks or longer. If adhesive strip edges start to loosen and curl up, you may trim the loose edges. Do not remove adhesive strips completely unless your health care provider tells you to do that.  Check your incision or puncture area every day for signs of infection. Check for: ? Redness, swelling, or pain. ? Fluid or blood. ? Warmth. ? Pus or a bad smell. General instructions  Take over-the-counter and prescription medicines only as told by your health care provider.  Wear compression stockings as told by your health care provider. These stockings help to prevent blood clots and reduce swelling in your legs.  Do not take baths, swim, or use a hot tub until your health care provider approves. Ask your health care provider if you can take showers.  Wear loose-fitting clothing.  Return to your normal activities as told by your health care provider. Ask your health care provider what activities are safe for you.  Get regular daily exercise. Walk  or ride a stationary bike daily or as told by your health care provider.  Keep all follow-up visits as told by your health care provider. This is important. Contact a health care provider if:  You have a fever.  You have redness, swelling, or pain around your incision or puncture site.  You have fluid or blood coming from your incision or puncture site.  Your incision or puncture site feels warm to the touch.  You have pus or a bad smell coming from your incision or puncture site.  You develop a cough. Get help right away if:  You pass out.  You have very bad pain in your leg.  You have leg pain that gets worse when you walk.  You have redness or swelling in your leg that is getting worse.  You have trouble breathing.  You cough up blood. Summary  After the procedure, it is common to have swelling, bruising, soreness, or mild skin discoloration.  Follow instructions from your health care provider about how to take care of your incision or puncture site.  Wear compression stockings as told by your health care provider. These stockings help to prevent blood clots and reduce swelling in your legs. This information is not intended to replace advice given to you by your health care provider. Make sure you discuss any questions you have with your health care provider. Document Released: 11/12/2016 Document Revised: 11/12/2016 Document Reviewed: 11/12/2016 Elsevier Interactive Patient Education  2018 [employer]  

## 2018-04-07 NOTE — Assessment & Plan Note (Signed)
blood pressure control important in reducing the progression of atherosclerotic disease. On appropriate oral medications.  

## 2018-04-24 ENCOUNTER — Ambulatory Visit (INDEPENDENT_AMBULATORY_CARE_PROVIDER_SITE_OTHER): Payer: Medicare Other

## 2018-04-24 VITALS — BP 138/62 | HR 72 | Temp 98.2°F | Ht 67.0 in | Wt 207.4 lb

## 2018-04-24 DIAGNOSIS — Z23 Encounter for immunization: Secondary | ICD-10-CM

## 2018-04-24 DIAGNOSIS — Z0000 Encounter for general adult medical examination without abnormal findings: Secondary | ICD-10-CM | POA: Diagnosis not present

## 2018-04-24 NOTE — Patient Instructions (Signed)
Ms. Ariana Guzman , Thank you for taking time to come for your Medicare Wellness Visit. I appreciate your ongoing commitment to your health goals. Please review the following plan we discussed and let me know if I can assist you in the future.   Screening recommendations/referrals: Colorectal Screening: No longer required Mammogram: Up to date Bone Density: Declined  Vision and Dental Exams: Recommended annual ophthalmology exams for early detection of glaucoma and other disorders of the eye Recommended annual dental exams for proper oral hygiene  Vaccinations: Influenza vaccine: Completed today Pneumococcal vaccine: Completed today Tdap vaccine: Declined. Please call your insurance company to determine your out of pocket expense. You may also receive this vaccine at your local pharmacy or Health Dept. Shingles vaccine: Please call your insurance company to determine your out of pocket expense for the Shingrix vaccine. You may also receive this vaccine at your local pharmacy or Health Dept.    Advanced directives: Please bring a copy of your POA (Power of Attorney) and/or Living Will to your next appointment.  Goals: Recommend to drink at least 6-8 8oz glasses of water per day.  Next appointment: Please schedule your Annual Wellness Visit with your Nurse Health Advisor in one year.  Preventive Care 23 Years and Older, Female Preventive care refers to lifestyle choices and visits with your health care provider that can promote health and wellness. What does preventive care include?  A yearly physical exam. This is also called an annual well check.  Dental exams once or twice a year.  Routine eye exams. Ask your health care provider how often you should have your eyes checked.  Personal lifestyle choices, including:  Daily care of your teeth and gums.  Regular physical activity.  Eating a healthy diet.  Avoiding tobacco and drug use.  Limiting alcohol use.  Practicing safe  sex.  Taking low-dose aspirin every day.  Taking vitamin and mineral supplements as recommended by your health care provider. What happens during an annual well check? The services and screenings done by your health care provider during your annual well check will depend on your age, overall health, lifestyle risk factors, and family history of disease. Counseling  Your health care provider may ask you questions about your:  Alcohol use.  Tobacco use.  Drug use.  Emotional well-being.  Home and relationship well-being.  Sexual activity.  Eating habits.  History of falls.  Memory and ability to understand (cognition).  Work and work Statistician.  Reproductive health. Screening  You may have the following tests or measurements:  Height, weight, and BMI.  Blood pressure.  Lipid and cholesterol levels. These may be checked every 5 years, or more frequently if you are over 8 years old.  Skin check.  Lung cancer screening. You may have this screening every year starting at age 51 if you have a 30-pack-year history of smoking and currently smoke or have quit within the past 15 years.  Fecal occult blood test (FOBT) of the stool. You may have this test every year starting at age 28.  Flexible sigmoidoscopy or colonoscopy. You may have a sigmoidoscopy every 5 years or a colonoscopy every 10 years starting at age 32.  Hepatitis C blood test.  Hepatitis B blood test.  Sexually transmitted disease (STD) testing.  Diabetes screening. This is done by checking your blood sugar (glucose) after you have not eaten for a while (fasting). You may have this done every 1-3 years.  Bone density scan. This is done to screen  for osteoporosis. You may have this done starting at age 22.  Mammogram. This may be done every 1-2 years. Talk to your health care provider about how often you should have regular mammograms. Talk with your health care provider about your test results,  treatment options, and if necessary, the need for more tests. Vaccines  Your health care provider may recommend certain vaccines, such as:  Influenza vaccine. This is recommended every year.  Tetanus, diphtheria, and acellular pertussis (Tdap, Td) vaccine. You may need a Td booster every 10 years.  Zoster vaccine. You may need this after age 59.  Pneumococcal 13-valent conjugate (PCV13) vaccine. One dose is recommended after age 25.  Pneumococcal polysaccharide (PPSV23) vaccine. One dose is recommended after age 35. Talk to your health care provider about which screenings and vaccines you need and how often you need them. This information is not intended to replace advice given to you by your health care provider. Make sure you discuss any questions you have with your health care provider. Document Released: 08/29/2015 Document Revised: 04/21/2016 Document Reviewed: 06/03/2015 Elsevier Interactive Patient Education  2017 [employer] Prevention in the Home Falls can cause injuries. They can happen to people of all ages. There are many things you can do to make your home safe and to help prevent falls. What can I do on the outside of my home?  Regularly fix the edges of walkways and driveways and fix any cracks.  Remove anything that might make you trip as you walk through a door, such as a raised step or threshold.  Trim any bushes or trees on the path to your home.  Use bright outdoor lighting.  Clear any walking paths of anything that might make someone trip, such as rocks or tools.  Regularly check to see if handrails are loose or broken. Make sure that both sides of any steps have handrails.  Any raised decks and porches should have guardrails on the edges.  Have any leaves, snow, or ice cleared regularly.  Use sand or salt on walking paths during winter.  Clean up any spills in your garage right away. This includes oil or grease spills. What can I do in the  bathroom?  Use night lights.  Install grab bars by the toilet and in the tub and shower. Do not use towel bars as grab bars.  Use non-skid mats or decals in the tub or shower.  If you need to sit down in the shower, use a plastic, non-slip stool.  Keep the floor dry. Clean up any water that spills on the floor as soon as it happens.  Remove soap buildup in the tub or shower regularly.  Attach bath mats securely with double-sided non-slip rug tape.  Do not have throw rugs and other things on the floor that can make you trip. What can I do in the bedroom?  Use night lights.  Make sure that you have a light by your bed that is easy to reach.  Do not use any sheets or blankets that are too big for your bed. They should not hang down onto the floor.  Have a firm chair that has side arms. You can use this for support while you get dressed.  Do not have throw rugs and other things on the floor that can make you trip. What can I do in the kitchen?  Clean up any spills right away.  Avoid walking on wet floors.  Keep items that you  use a lot in easy-to-reach places.  If you need to reach something above you, use a strong step stool that has a grab bar.  Keep electrical cords out of the way.  Do not use floor polish or wax that makes floors slippery. If you must use wax, use non-skid floor wax.  Do not have throw rugs and other things on the floor that can make you trip. What can I do with my stairs?  Do not leave any items on the stairs.  Make sure that there are handrails on both sides of the stairs and use them. Fix handrails that are broken or loose. Make sure that handrails are as long as the stairways.  Check any carpeting to make sure that it is firmly attached to the stairs. Fix any carpet that is loose or worn.  Avoid having throw rugs at the top or bottom of the stairs. If you do have throw rugs, attach them to the floor with carpet tape.  Make sure that you have a  light switch at the top of the stairs and the bottom of the stairs. If you do not have them, ask someone to add them for you. What else can I do to help prevent falls?  Wear shoes that:  Do not have high heels.  Have rubber bottoms.  Are comfortable and fit you well.  Are closed at the toe. Do not wear sandals.  If you use a stepladder:  Make sure that it is fully opened. Do not climb a closed stepladder.  Make sure that both sides of the stepladder are locked into place.  Ask someone to hold it for you, if possible.  Clearly mark and make sure that you can see:  Any grab bars or handrails.  First and last steps.  Where the edge of each step is.  Use tools that help you move around (mobility aids) if they are needed. These include:  Canes.  Walkers.  Scooters.  Crutches.  Turn on the lights when you go into a dark area. Replace any light bulbs as soon as they burn out.  Set up your furniture so you have a clear path. Avoid moving your furniture around.  If any of your floors are uneven, fix them.  If there are any pets around you, be aware of where they are.  Review your medicines with your doctor. Some medicines can make you feel dizzy. This can increase your chance of falling. Ask your doctor what other things that you can do to help prevent falls. This information is not intended to replace advice given to you by your health care provider. Make sure you discuss any questions you have with your health care provider. Document Released: 05/29/2009 Document Revised: 01/08/2016 Document Reviewed: 09/06/2014 Elsevier Interactive Patient Education  2017 [employer].

## 2018-04-24 NOTE — Progress Notes (Signed)
Subjective:   Ariana Guzman is a 77 y.o. female who presents for an Initial Medicare Annual Wellness Visit.  Review of Systems    N/A  Cardiac Risk Factors include: dyslipidemia;hypertension;advanced age (>25men, >67 women);obesity (BMI >30kg/m2);sedentary lifestyle     Objective:    Today's Vitals   04/24/18 1408  BP: 138/62  Pulse: 72  Temp: 98.2 F (36.8 C)  TempSrc: Oral  SpO2: 92%  Weight: 207 lb 6.4 oz (94.1 kg)  Height: 5\' 7"  (1.702 m)   Body mass index is 32.48 kg/m.  Advanced Directives 04/24/2018 02/04/2018 11/29/2017 05/28/2017  Does Patient Have a Medical Advance Directive? No No No No  Would patient like information on creating a medical advance directive? Yes (MAU/Ambulatory/Procedural Areas - Information given) No - Patient declined No - Patient declined No - Patient declined    Current Medications (verified) Outpatient Encounter Medications as of 04/24/2018  Medication Sig  . albuterol (PROVENTIL) (2.5 MG/3ML) 0.083% nebulizer solution Inhale into the lungs.  . Ascorbic Acid (VITAMIN C) 1000 MG tablet Take 1,000 mg by mouth daily.  . calcium carbonate (OSCAL) 1500 (600 Ca) MG TABS tablet Take by mouth 2 (two) times daily with a meal.  . cholecalciferol (VITAMIN D) 1000 units tablet Take 1,000 Units by mouth daily.  . Fluticasone-Salmeterol (ADVAIR) 250-50 MCG/DOSE AEPB Inhale 1 puff into the lungs 2 (two) times daily.  . hydrochlorothiazide (HYDRODIURIL) 25 MG tablet Take 25 mg by mouth daily.   Marland Kitchen latanoprost (XALATAN) 0.005 % ophthalmic solution 1 drop at bedtime.  Marland Kitchen levothyroxine (SYNTHROID, LEVOTHROID) 75 MCG tablet Take 75 mcg by mouth daily before breakfast.  . loratadine (CLARITIN) 10 MG tablet Take 10 mg by mouth daily.  Marland Kitchen losartan (COZAAR) 25 MG tablet Take 25 mg by mouth daily.  Marland Kitchen MAGNESIUM-ZINC PO Take 351 mg by mouth at bedtime as needed.  . metoprolol tartrate (LOPRESSOR) 25 MG tablet Take 25 mg by mouth 2 (two) times daily.  .  multivitamin-lutein (OCUVITE-LUTEIN) CAPS capsule Take 1 capsule by mouth daily.  . Rivaroxaban (XARELTO) 15 MG TABS tablet Take 15 mg by mouth 2 (two) times daily with a meal.  . Calcium Carb-Cholecalciferol [phone] MG-UNIT TABS Take by mouth.  . furosemide (LASIX) 20 MG tablet Take 1 tablet (20 mg total) by mouth daily as needed.  . Melatonin-Theanine (MELATONIN FORTE/L-THEANINE PO) Take 200 mg by mouth at bedtime as needed.  . Multiple Vitamin (MULTI-VITAMINS) TABS Take by mouth.  . Omega-3 1000 MG CAPS Take by mouth.  . predniSONE (DELTASONE) 20 MG tablet Take 1 tablets (20 mg) daily by mouth   No facility-administered encounter medications on file as of 04/24/2018.     Allergies (verified) Tramadol and Meloxicam   History: Past Medical History:  Diagnosis Date  . Asthma   . Atrial fibrillation ([place])   . Hypertension    Past Surgical History:  Procedure Laterality Date  . ABDOMINAL HYSTERECTOMY  1991   uterus only- still has cervix - done for prolapse  . COLONOSCOPY  2009   normal  . TOE SURGERY     Family History  Problem Relation Age of Onset  . Breast cancer Other 56  . Dementia Mother   . Cancer Father        prostate and bone  . Heart attack Sister    Social History   Socioeconomic History  . Marital status: Widowed    Spouse name: Not on file  . Number of children: 2  . Years  of education: Not on file  . Highest education level: 12th grade  Occupational History  . Occupation: retired  Scientific laboratory technician  . Financial resource strain: Not hard at all  . Food insecurity:    Worry: Never true    Inability: Never true  . Transportation needs:    Medical: No    Non-medical: No  Tobacco Use  . Smoking status: Former Smoker    Packs/day: 0.50    Years: 29.00    Pack years: 14.50    Types: Cigarettes    Last attempt to quit: 09/28/1989    Years since quitting: 28.5  . Smokeless tobacco: Never Used  . Tobacco comment: smoking cessation materials not required    Substance and Sexual Activity  . Alcohol use: No    Frequency: Never  . Drug use: No  . Sexual activity: Not Currently  Lifestyle  . Physical activity:    Days per week: 0 days    Minutes per session: 0 min  . Stress: Not at all  Relationships  . Social connections:    Talks on phone: Patient refused    Gets together: Patient refused    Attends religious service: Patient refused    Active member of club or organization: Patient refused    Attends meetings of clubs or organizations: Patient refused    Relationship status: Widowed  Other Topics Concern  . Not on file  Social History Narrative  . Not on file    Tobacco Counseling Counseling given: No Comment: smoking cessation materials not required  Clinical Intake:  Pre-visit preparation completed: No  Pain : No/denies pain   BMI - recorded: 32.48 Nutritional Status: BMI > 30  Obese Nutritional Risks: None Diabetes: No  How often do you need to have someone help you when you read instructions, pamphlets, or other written materials from your doctor or pharmacy?: 1 - Never  Interpreter Needed?: No  Information entered by :: AEversole, LPN   Activities of Daily Living In your present state of health, do you have any difficulty performing the following activities: 04/24/2018 11/22/2017  Hearing? N N  Comment denies hearing aids -  Vision? N N  Comment wears eyeglasses -  Difficulty concentrating or making decisions? N N  Walking or climbing stairs? Y N  Comment dyspnea -  Dressing or bathing? N N  Doing errands, shopping? N N  Preparing Food and eating ? N -  Comment denies dentures -  Using the Toilet? N -  In the past six months, have you accidently leaked urine? N -  Do you have problems with loss of bowel control? N -  Managing your Medications? N -  Managing your Finances? N -  Housekeeping or managing your Housekeeping? N -     Immunizations and Health Maintenance Immunization History  Administered  Date(s) Administered  . Influenza, High Dose Seasonal PF 08/11/2017, 04/24/2018  . Pneumococcal Conjugate-13 04/24/2018  . Tdap 02/26/2015   There are no preventive care reminders to display for this patient.  Patient Care Team: Glean Hess, MD as PCP - General (Internal Medicine) Idamae Schuller, Laban Emperor, MD as Consulting Physician (Pulmonary Disease) Edwyna Ready, MD as Consulting Physician (Cardiology) Dr Jerrilyn Cairo as Consulting Physician (Sleep Medicine) Eulogio Bear, MD as Consulting Physician (Ophthalmology)  Indicate any recent Medical Services you may have received from other than [facility] providers in the past year (date may be approximate).     Assessment:   This is a routine wellness  examination for [facility].  Hearing/Vision screen Vision Screening Comments: Sees Dr. Edison Pace for annual eye exams  Dietary issues and exercise activities discussed: Current Exercise Habits: The patient does not participate in regular exercise at present, Exercise limited by: None identified  Goals    . DIET - INCREASE WATER INTAKE     Recommend to drink at least 6-8 8oz glasses of water per day.      Depression Screen PHQ 2/9 Scores 04/24/2018 11/22/2017  PHQ - 2 Score 0 0  PHQ- 9 Score 0 -    Fall Risk Fall Risk  04/24/2018 11/22/2017  Falls in the past year? No No  Risk for fall due to : Impaired vision -  Risk for fall due to: Comment wears eyeglasses -    FALL RISK PREVENTION PERTAINING TO HOME: Is your home free of loose throw rugs in walkways, pet beds, electrical cords, etc? Yes Is there adequate lighting in your home to reduce risk of falls?  Yes Are there stairs in or around your home WITH handrails? Yes  ASSISTIVE DEVICES UTILIZED TO PREVENT FALLS: Use of a cane, walker or w/c? No Grab bars in the bathroom? No  Shower chair or a place to sit while bathing? Yes An elevated toilet seat or a handicapped toilet? No  Timed Get Up and Go Performed: Yes. Pt ambulated  10 feet within 29 sec. Gait slow, steady and without the use of an assistive device. No intervention required at this time. Fall risk prevention has been discussed.  Community Resource Referral:  Pt declined my offer to send [employer] Referral to Care Guide for installation of grab bars in the shower or an elevated toilet seat.  Cognitive Function:     6CIT Screen 04/24/2018  What Year? 0 points  What month? 0 points  What time? 3 points  Count back from 20 0 points  Months in reverse 0 points  Repeat phrase 0 points  Total Score 3    Screening Tests Health Maintenance  Topic Date Due  . DEXA SCAN  04/25/2019 (Originally 08/08/2004)  . MAMMOGRAM  08/01/2018  . PNA vac Low Risk Adult (2 of 2 - PPSV23) 04/25/2019  . TETANUS/TDAP  02/25/2025  . INFLUENZA VACCINE  Completed    Qualifies for Shingles Vaccine? Yes. Due for Shingrix. Education has been provided regarding the importance of this vaccine. Pt has been advised to call insurance company to determine out of pocket expense. Advised may also receive vaccine at local pharmacy or Health Dept. Verbalized acceptance and understanding.  Cancer Screenings: Lung: Low Dose CT Chest recommended if Age 50-80 years, 30 pack-year currently smoking OR have quit w/in 15years. Patient does not qualify. Breast: Up to date on Mammogram? Yes. Completed 08/01/17. Repeat every year   Up to date of Bone Density/Dexa? No. Declined my offer to order this screening Colorectal: No longer required  Additional Screenings: Hepatitis C Screening: Does not qualify   Plan:  I have personally reviewed and addressed the Medicare Annual Wellness questionnaire and have noted the following in the patient's chart:  A. Medical and social history B. Use of alcohol, tobacco or illicit drugs  C. Current medications and supplements D. Functional ability and status E.  Nutritional status F.  Physical activity G. Advance directives H. List of other  physicians I.  Hospitalizations, surgeries, and ER visits in previous 12 months J.  [place] such as hearing and vision if needed, cognitive and depression L. Referrals and appointments  In addition, I have reviewed and discussed with patient certain preventive protocols, quality metrics, and best practice recommendations. A written personalized care plan for preventive services as well as general preventive health recommendations were provided to patient.  Signed,  Aleatha Borer, LPN Nurse Health Advisor  MD Recommendations: Due for Shingrix. Education has been provided regarding the importance of this vaccine. Pt has been advised to call insurance company to determine out of pocket expense. Advised may also receive vaccine at local pharmacy or Health Dept. Verbalized acceptance and understanding.  Bone Density/Dexa: Declined my offer to order this screening

## 2018-05-10 ENCOUNTER — Other Ambulatory Visit (INDEPENDENT_AMBULATORY_CARE_PROVIDER_SITE_OTHER): Payer: Self-pay | Admitting: Vascular Surgery

## 2018-05-10 DIAGNOSIS — I872 Venous insufficiency (chronic) (peripheral): Secondary | ICD-10-CM

## 2018-05-12 ENCOUNTER — Encounter (INDEPENDENT_AMBULATORY_CARE_PROVIDER_SITE_OTHER): Payer: Self-pay | Admitting: Vascular Surgery

## 2018-05-12 ENCOUNTER — Ambulatory Visit (INDEPENDENT_AMBULATORY_CARE_PROVIDER_SITE_OTHER): Payer: Medicare Other | Admitting: Vascular Surgery

## 2018-05-12 VITALS — BP 134/84 | HR 74 | Resp 17 | Ht 65.5 in | Wt 204.0 lb

## 2018-05-12 DIAGNOSIS — I83813 Varicose veins of bilateral lower extremities with pain: Secondary | ICD-10-CM | POA: Diagnosis not present

## 2018-05-12 NOTE — Addendum Note (Signed)
Addended by: Algernon Huxley on: 05/12/2018 04:11 PM   Modules accepted: Orders

## 2018-05-12 NOTE — Progress Notes (Signed)
Varicose veins of both lower extremities with pain     The patient's left lower extremity was sterilely prepped and draped. The ultrasound machine was used to visualize the saphenous vein throughout its course. A segment in the mid to upper calf was selected for access. The saphenous vein was accessed without difficulty using ultrasound guidance with a micro puncture needle. A micro puncture wire and sheath were then placed. A 0.018 wire was placed beyond the saphenofemoral junction through the sheath and the micro puncture sheath was removed. The 65 cm sheath was then placed over the wire and the wire and dilator were removed. The laser fiber was placed through the sheath and its tip was placed approximately 5 cm below the saphenofemoral junction. Tumescent anesthesia was then created with a dilute lidocaine solution. Laser energy was then delivered with constant withdrawal of the sheath and laser fiber. Approximately 1816 Joules of energy were delivered over a length of 37 cm using the 1470 Hz Vena[employer]ure machine at [employer].  A little more energy than typical was used due to the large size of the vein.  Sterile dressings were placed. The patient tolerated the procedure well without complications.

## 2018-05-16 ENCOUNTER — Ambulatory Visit (INDEPENDENT_AMBULATORY_CARE_PROVIDER_SITE_OTHER): Payer: Medicare Other

## 2018-05-16 DIAGNOSIS — I872 Venous insufficiency (chronic) (peripheral): Secondary | ICD-10-CM | POA: Diagnosis not present

## 2018-05-25 ENCOUNTER — Encounter: Payer: Self-pay | Admitting: Internal Medicine

## 2018-05-25 ENCOUNTER — Ambulatory Visit (INDEPENDENT_AMBULATORY_CARE_PROVIDER_SITE_OTHER): Payer: Medicare Other | Admitting: Internal Medicine

## 2018-05-25 VITALS — BP 128/80 | HR 68 | Ht 65.5 in | Wt 206.2 lb

## 2018-05-25 DIAGNOSIS — F5101 Primary insomnia: Secondary | ICD-10-CM

## 2018-05-25 DIAGNOSIS — I482 Chronic atrial fibrillation, unspecified: Secondary | ICD-10-CM

## 2018-05-25 DIAGNOSIS — R600 Localized edema: Secondary | ICD-10-CM | POA: Diagnosis not present

## 2018-05-25 DIAGNOSIS — E039 Hypothyroidism, unspecified: Secondary | ICD-10-CM

## 2018-05-25 DIAGNOSIS — J45998 Other asthma: Secondary | ICD-10-CM | POA: Diagnosis not present

## 2018-05-25 DIAGNOSIS — Z1231 Encounter for screening mammogram for malignant neoplasm of breast: Secondary | ICD-10-CM

## 2018-05-25 DIAGNOSIS — Z0000 Encounter for general adult medical examination without abnormal findings: Secondary | ICD-10-CM

## 2018-05-25 DIAGNOSIS — I10 Essential (primary) hypertension: Secondary | ICD-10-CM

## 2018-05-25 DIAGNOSIS — D485 Neoplasm of uncertain behavior of skin: Secondary | ICD-10-CM

## 2018-05-25 DIAGNOSIS — E782 Mixed hyperlipidemia: Secondary | ICD-10-CM

## 2018-05-25 DIAGNOSIS — G4733 Obstructive sleep apnea (adult) (pediatric): Secondary | ICD-10-CM

## 2018-05-25 MED ORDER — LEVOTHYROXINE SODIUM 75 MCG PO TABS: 75.0000 ug | ORAL_TABLET | Freq: Every day | ORAL | 3 refills | Status: DC

## 2018-05-25 MED ORDER — ALPRAZOLAM 0.25 MG PO TABS: 0.2500 mg | ORAL_TABLET | Freq: Two times a day (BID) | ORAL | 5 refills | Status: DC | PRN

## 2018-05-25 MED ORDER — LOSARTAN POTASSIUM 25 MG PO TABS: 25.0000 mg | ORAL_TABLET | Freq: Every day | ORAL | 3 refills | Status: DC

## 2018-05-25 MED ORDER — METOPROLOL TARTRATE 25 MG PO TABS: 25.0000 mg | ORAL_TABLET | Freq: Two times a day (BID) | ORAL | 3 refills | Status: DC

## 2018-05-25 MED ORDER — HYDROCHLOROTHIAZIDE 25 MG PO TABS: 25.0000 mg | ORAL_TABLET | Freq: Every day | ORAL | 3 refills | Status: DC

## 2018-05-25 NOTE — Progress Notes (Signed)
Date:  05/25/2018   Name:  Ariana Guzman   DOB:  [birth year]   MRN:  [id number]   Chief Complaint: Annual Exam (Breast Exam. had MAW one month ago. ) and Hypertension Ariana Guzman is a 77 y.o. female who presents today for her Complete Annual Exam. She feels well. She reports exercising none due to leg pain. She reports she is sleeping poorly. She is recovering from recent left vein surgery. She is due for mammogram. She denies breast problems.  Hypertension  Pertinent negatives include no chest pain, headaches, palpitations or shortness of breath. Identifiable causes of hypertension include a thyroid problem.  Hyperlipidemia  This is a chronic problem. The problem is controlled. Pertinent negatives include no chest pain or shortness of breath. Current antihyperlipidemic treatment includes statins. The current treatment provides significant improvement of lipids.  Thyroid Problem  Presents for follow-up visit. Patient reports no anxiety, constipation, diarrhea, fatigue, palpitations or tremors. The symptoms have been stable. Her past medical history is significant for hyperlipidemia.  Insomnia  Primary symptoms: sleep disturbance, difficulty falling asleep, premature morning awakening.  Past treatments include medication (over the counter medication and in the past uses low dose alprazolam).  Asthma  There is no cough, shortness of breath or wheezing. This is a chronic problem. The problem occurs intermittently. The problem has been unchanged. Pertinent negatives include no chest pain, fever, headaches or trouble swallowing. Her symptoms are aggravated by exercise and pollen. Her symptoms are alleviated by beta-agonist and steroid inhaler. She reports significant improvement on treatment. Her past medical history is significant for asthma. Past medical history comments: Followed by Pulmonary.   Chronic Afib - on anticoagulation with no bleeding.  She does not have sx from Afib.  No  limitation to activities which are limited normally by leg pain.  She is prescribed DOAC from cardiology.  OSA - on CPAP nightly, trying to be compliant with 4 hours per night. She gets pressure, choking and PND.  She takes mucinex and claritin and is concerned about taking these long term.  Review of Systems  Constitutional: Negative for chills, fatigue and fever.  HENT: Negative for congestion, hearing loss, tinnitus, trouble swallowing and voice change.   Eyes: Negative for visual disturbance.  Respiratory: Negative for cough, chest tightness, shortness of breath and wheezing.   Cardiovascular: Positive for leg swelling. Negative for chest pain and palpitations.  Gastrointestinal: Negative for abdominal pain, constipation, diarrhea and vomiting.  Endocrine: Negative for polydipsia and polyuria.  Genitourinary: Negative for dysuria, frequency, genital sores, vaginal bleeding and vaginal discharge.  Musculoskeletal: Positive for arthralgias and gait problem. Negative for joint swelling.  Skin: Negative for color change and rash.       Multiple skin lesions over chest, abdomen and back   Allergic/Immunologic: Positive for environmental allergies.  Neurological: Negative for dizziness, tremors, light-headedness and headaches.  Hematological: Negative for adenopathy. Does not bruise/bleed easily.  Psychiatric/Behavioral: Positive for sleep disturbance. Negative for dysphoric mood. The patient has insomnia. The patient is not nervous/anxious.     Patient Active Problem List   Diagnosis Date Noted  . Chronic insomnia 09/15/2017  . Essential hypertension 09/09/2017  . 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 Bilateral lower extremity edema 09/09/2017  . Pain in both lower extremities 09/09/2017  . Current use of long term anticoagulation 07/28/2017  . 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 Otitis externa, chronic 12/03/2015  . Obstructive  sleep apnea 10/07/2015  .  Asthma, persistent controlled 09/25/2015  . Acquired hypothyroidism 02/29/2012  . Chronic atrial fibrillation 02/29/2012  . Hyperlipidemia 02/29/2012    Allergies  Allergen Reactions  . Tramadol Shortness Of Breath  . Meloxicam Rash    Past Surgical History:  Procedure Laterality Date  . ABDOMINAL HYSTERECTOMY  1991   uterus only- still has cervix - done for prolapse  . COLONOSCOPY  2009   normal  . TOE SURGERY      Social History   Tobacco Use  . Smoking status: Former Smoker    Packs/day: 0.50    Years: 29.00    Pack years: 14.50    Types: Cigarettes    Last attempt to quit: 09/28/1989    Years since quitting: 28.6  . Smokeless tobacco: Never Used  . Tobacco comment: smoking cessation materials not required  Substance Use Topics  . Alcohol use: No    Frequency: Never  . Drug use: No     Medication list has been reviewed and updated.  Current Meds  Medication Sig  . ALPRAZolam (XANAX) 0.5 MG tablet TAKE FIRST PILL ONE HOUR PRIOR TO PROCEDUCE.TAKE SECOND PILL UPON ARRIVAL TO OFFICE  . Ascorbic Acid (VITAMIN C) 1000 MG tablet Take 1,000 mg by mouth daily.  . Calcium Carb-Cholecalciferol [phone] MG-UNIT TABS Take by mouth.  . calcium carbonate (OSCAL) 1500 (600 Ca) MG TABS tablet Take by mouth 2 (two) times daily with a meal.  . cholecalciferol (VITAMIN D) 1000 units tablet Take 1,000 Units by mouth daily.  . Fluticasone-Salmeterol (ADVAIR) 250-50 MCG/DOSE AEPB Inhale 1 puff into the lungs 2 (two) times daily.  . furosemide (LASIX) 20 MG tablet Take 1 tablet (20 mg total) by mouth daily as needed.  . hydrochlorothiazide (HYDRODIURIL) 25 MG tablet Take 25 mg by mouth daily.   Marland Kitchen latanoprost (XALATAN) 0.005 % ophthalmic solution 1 drop at bedtime.  Marland Kitchen levothyroxine (SYNTHROID, LEVOTHROID) 75 MCG tablet Take 75 mcg by mouth daily before breakfast.  . loratadine (CLARITIN) 10 MG tablet Take 10 mg by mouth daily.  Marland Kitchen losartan (COZAAR) 25 MG  tablet Take 25 mg by mouth daily.  Marland Kitchen MAGNESIUM-ZINC PO Take 351 mg by mouth at bedtime as needed.  . Melatonin-Theanine (MELATONIN FORTE/L-THEANINE PO) Take 200 mg by mouth at bedtime as needed.  . metoprolol tartrate (LOPRESSOR) 25 MG tablet Take 25 mg by mouth 2 (two) times daily.  . Multiple Vitamin (MULTI-VITAMINS) TABS Take by mouth.  . multivitamin-lutein (OCUVITE-LUTEIN) CAPS capsule Take 1 capsule by mouth daily.  . Omega-3 1000 MG CAPS Take by mouth.  . predniSONE (DELTASONE) 20 MG tablet Take 1 tablets (20 mg) daily by mouth  . Rivaroxaban (XARELTO) 15 MG TABS tablet Take 15 mg by mouth 2 (two) times daily with a meal.    PHQ 2/9 Scores 04/24/2018 11/22/2017  PHQ - 2 Score 0 0  PHQ- 9 Score 0 -    Physical Exam  Constitutional: She is oriented to person, place, and time. She appears well-developed and well-nourished. No distress.  HENT:  Head: Normocephalic and atraumatic.  Right Ear: Tympanic membrane and ear canal normal.  Left Ear: Tympanic membrane and ear canal normal.  Nose: Right sinus exhibits no maxillary sinus tenderness. Left sinus exhibits no maxillary sinus tenderness.  Mouth/Throat: Uvula is midline and oropharynx is clear and moist.  Eyes: Conjunctivae and EOM are normal. Right eye exhibits no discharge. Left eye exhibits no discharge. No scleral icterus.  Neck: Normal range of motion. Carotid bruit is not present.  No erythema present. No thyromegaly present.  Cardiovascular: Normal rate, normal heart sounds, intact distal pulses and normal pulses. An irregularly irregular rhythm present. Exam reveals no gallop.  No murmur heard. Pulmonary/Chest: Effort normal. No respiratory distress. She has no wheezes. Right breast exhibits inverted nipple. Right breast exhibits no mass, no nipple discharge, no skin change and no tenderness. Left breast exhibits inverted nipple. Left breast exhibits no mass, no nipple discharge, no skin change and no tenderness.  Abdominal: Soft.  Bowel sounds are normal. There is no hepatosplenomegaly. There is no tenderness. There is no CVA tenderness.  Musculoskeletal: Normal range of motion. She exhibits edema (trace at ankles).  Lymphadenopathy:    She has no cervical adenopathy.    She has no axillary adenopathy.  Neurological: She is alert and oriented to person, place, and time. She has normal reflexes. No cranial nerve deficit or sensory deficit.  Skin: Skin is warm, dry and intact. No rash noted.  Psychiatric: She has a normal mood and affect. Her speech is normal and behavior is normal. Thought content normal.  Nursing note and vitals reviewed.   BP 128/80 (BP Location: Right Arm, Patient Position: Sitting, Cuff Size: Normal)   Pulse 68   Ht 5' 5.5" (1.664 m)   Wt 206 lb 3.2 oz (93.5 kg)   SpO2 96%   BMI 33.79 kg/m   Assessment and Plan:  1. Annual physical exam Work on improving diet and avoiding further weight gain  2. Encounter for screening mammogram for breast cancer Schedule at [facility]; Future  3. Essential hypertension Controlled on current therapy - metoprolol tartrate (LOPRESSOR) 25 MG tablet; Take 1 tablet (25 mg total) by mouth 2 (two) times daily.  Dispense: 180 tablet; Refill: 3 - hydrochlorothiazide (HYDRODIURIL) 25 MG tablet; Take 1 tablet (25 mg total) by mouth daily.  Dispense: 90 tablet; Refill: 3 - losartan (COZAAR) 25 MG tablet; Take 1 tablet (25 mg total) by mouth daily.  Dispense: 90 tablet; Refill: 3 - CBC with Differential/Platelet - Comprehensive metabolic panel  4. Chronic atrial fibrillation Rate controlled On DOAC  5. Asthma, persistent controlled Stable on Advair Will need PPV-23 next September Influenza vaccine given yesterday  6. Bilateral lower extremity edema Continue compression hose Use Lasix PRN only  7. Mixed hyperlipidemia Not currently on statin therapy; taking Omega-3 FA - Lipid panel  8. Acquired hypothyroidism supplemented -  levothyroxine (SYNTHROID, LEVOTHROID) 75 MCG tablet; Take 1 tablet (75 mcg total) by mouth daily before breakfast.  Dispense: 90 tablet; Refill: 3 - TSH  9. Obstructive sleep apnea Continue to use CPAP at least  4 hours per night  10. Neoplasm of uncertain behavior of skin - Ambulatory referral to Dermatology  11. Primary insomnia May take low dose at HS as needed - ALPRAZolam (XANAX) 0.25 MG tablet; Take 1 tablet (0.25 mg total) by mouth 2 (two) times daily as needed for anxiety.  Dispense: 30 tablet; Refill: 5   Partially dictated using Editor, commissioning. Any errors are unintentional.  Halina Maidens, MD [facility]  05/25/2018

## 2018-05-26 LAB — LIPID PANEL
CHOLESTEROL TOTAL: 185 mg/dL (ref 100–199)
Chol/HDL Ratio: 3.5 ratio (ref 0.0–4.4)
HDL: 53 mg/dL (ref >39)
LDL Calculated: 105 mg/dL — ABNORMAL HIGH (ref 0–99)
Triglycerides: 133 mg/dL (ref 0–149)
VLDL CHOLESTEROL CAL: 27 mg/dL (ref 5–40)

## 2018-05-26 LAB — COMPREHENSIVE METABOLIC PANEL
A/G RATIO: 1.8 (ref 1.2–2.2)
ALBUMIN: 4.3 g/dL (ref 3.5–4.8)
ALK PHOS: 54 IU/L (ref 39–117)
ALT: 18 IU/L (ref 0–32)
AST: 16 IU/L (ref 0–40)
BILIRUBIN TOTAL: 1.5 mg/dL — AB (ref 0.0–1.2)
BUN / CREAT RATIO: 20 (ref 12–28)
BUN: 18 mg/dL (ref 8–27)
CHLORIDE: 88 mmol/L — AB (ref 96–106)
CO2: 28 mmol/L (ref 20–29)
Calcium: 9.3 mg/dL (ref 8.7–10.3)
Creatinine, Ser: 0.9 mg/dL (ref 0.57–1.00)
GFR calc non Af Amer: 61 mL/min/{1.73_m2} (ref >59)
GFR, EST AFRICAN AMERICAN: 71 mL/min/{1.73_m2} (ref >59)
GLUCOSE: 118 mg/dL — AB (ref 65–99)
Globulin, Total: 2.4 g/dL (ref 1.5–4.5)
Potassium: 4.2 mmol/L (ref 3.5–5.2)
Sodium: 134 mmol/L (ref 134–144)
TOTAL PROTEIN: 6.7 g/dL (ref 6.0–8.5)

## 2018-05-26 LAB — CBC WITH DIFFERENTIAL/PLATELET
BASOS ABS: 0.1 10*3/uL (ref 0.0–0.2)
Basos: 1 %
EOS (ABSOLUTE): 0.5 10*3/uL — ABNORMAL HIGH (ref 0.0–0.4)
Eos: 5 %
HEMOGLOBIN: 14.1 g/dL (ref 11.1–15.9)
Hematocrit: 42.1 % (ref 34.0–46.6)
IMMATURE GRANS (ABS): 0 10*3/uL (ref 0.0–0.1)
Immature Granulocytes: 0 %
LYMPHS ABS: 3 10*3/uL (ref 0.7–3.1)
LYMPHS: 30 %
MCH: 29.6 pg (ref 26.6–33.0)
MCHC: 33.5 g/dL (ref 31.5–35.7)
MCV: 88 fL (ref 79–97)
Monocytes Absolute: 0.9 10*3/uL (ref 0.1–0.9)
Monocytes: 9 %
NEUTROS ABS: 5.6 10*3/uL (ref 1.4–7.0)
NEUTROS PCT: 55 %
Platelets: 278 10*3/uL (ref 150–450)
RBC: 4.76 x10E6/uL (ref 3.77–5.28)
RDW: 13.1 % (ref 12.3–15.4)
WBC: 10.1 10*3/uL (ref 3.4–10.8)

## 2018-05-26 LAB — TSH: TSH: 1.91 u[IU]/mL (ref 0.450–4.500)

## 2018-05-29 ENCOUNTER — Encounter: Payer: Self-pay | Admitting: Internal Medicine

## 2018-05-29 DIAGNOSIS — R7303 Prediabetes: Secondary | ICD-10-CM | POA: Insufficient documentation

## 2018-05-29 DIAGNOSIS — E118 Type 2 diabetes mellitus with unspecified complications: Secondary | ICD-10-CM | POA: Insufficient documentation

## 2018-05-29 LAB — SPECIMEN STATUS REPORT

## 2018-05-29 LAB — HGB A1C W/O EAG: HEMOGLOBIN A1C: 6.8 % — AB (ref 4.8–5.6)

## 2018-06-09 ENCOUNTER — Encounter (INDEPENDENT_AMBULATORY_CARE_PROVIDER_SITE_OTHER): Payer: Self-pay | Admitting: Vascular Surgery

## 2018-06-09 ENCOUNTER — Ambulatory Visit (INDEPENDENT_AMBULATORY_CARE_PROVIDER_SITE_OTHER): Payer: Medicare Other | Admitting: Vascular Surgery

## 2018-06-09 VITALS — BP 128/77 | HR 76 | Resp 19 | Ht 66.0 in | Wt 205.0 lb

## 2018-06-09 DIAGNOSIS — I83813 Varicose veins of bilateral lower extremities with pain: Secondary | ICD-10-CM | POA: Diagnosis not present

## 2018-06-09 NOTE — Progress Notes (Signed)
Varicose veins of both lower extremities with pain     The patient's right lower extremity was sterilely prepped and draped. The ultrasound machine was used to visualize the saphenous vein throughout its course. A segment just below the knee was selected for access. The saphenous vein was accessed without difficulty using ultrasound guidance with a micro puncture needle. A micro puncture wire and sheath were then placed. A 0.018 wire was placed beyond the saphenofemoral junction through the sheath and the micro puncture sheath was removed. The 65 cm sheath was then placed over the wire and the wire and dilator were removed. The laser fiber was placed through the sheath and its tip was placed approximately 4 cm below the saphenofemoral junction. Tumescent anesthesia was then created with a dilute lidocaine solution. Laser energy was then delivered with constant withdrawal of the sheath and laser fiber. Approximately 1410 Joules of energy were delivered over a length of 33 cm using the 1470 Hz VenaCure machine at [employer]. Sterile dressings were placed. The patient tolerated the procedure well without complications.

## 2018-06-12 ENCOUNTER — Encounter (INDEPENDENT_AMBULATORY_CARE_PROVIDER_SITE_OTHER): Payer: Medicare Other

## 2018-06-12 ENCOUNTER — Ambulatory Visit (INDEPENDENT_AMBULATORY_CARE_PROVIDER_SITE_OTHER): Payer: Medicare Other

## 2018-06-12 DIAGNOSIS — I83813 Varicose veins of bilateral lower extremities with pain: Secondary | ICD-10-CM | POA: Diagnosis not present

## 2018-06-21 ENCOUNTER — Other Ambulatory Visit: Payer: Self-pay

## 2018-07-03 ENCOUNTER — Ambulatory Visit (INDEPENDENT_AMBULATORY_CARE_PROVIDER_SITE_OTHER): Payer: Medicare Other | Admitting: Vascular Surgery

## 2018-07-03 ENCOUNTER — Encounter (INDEPENDENT_AMBULATORY_CARE_PROVIDER_SITE_OTHER): Payer: Self-pay | Admitting: Vascular Surgery

## 2018-07-03 VITALS — BP 151/82 | HR 69 | Resp 18 | Ht 64.0 in | Wt 200.0 lb

## 2018-07-03 DIAGNOSIS — I10 Essential (primary) hypertension: Secondary | ICD-10-CM

## 2018-07-03 DIAGNOSIS — I83813 Varicose veins of bilateral lower extremities with pain: Secondary | ICD-10-CM | POA: Diagnosis not present

## 2018-07-03 DIAGNOSIS — I482 Chronic atrial fibrillation, unspecified: Secondary | ICD-10-CM | POA: Diagnosis not present

## 2018-07-03 DIAGNOSIS — Z87891 Personal history of nicotine dependence: Secondary | ICD-10-CM

## 2018-07-03 DIAGNOSIS — E782 Mixed hyperlipidemia: Secondary | ICD-10-CM

## 2018-07-03 NOTE — Progress Notes (Signed)
MRN : [id number]  Ariana Guzman is a 77 y.o. (1940/10/30) female who presents with chief complaint of  Chief Complaint  Patient presents with  . Follow-up    3 week f/u  .  History of Present Illness:   The patient returns to the office for followup status post laser ablation of the bilateral great saphenous veins bilaterally.  The patient note significant improvement in the lower extremity pain but not resolution of the symptoms. The patient notes multiple residual varicosities bilaterally which continued to hurt with dependent positions and remained tender to palpation. The patient's swelling is minimally from preoperative status. The patient continues to wear graduated compression stockings on a daily basis but these are not eliminating the pain and discomfort. The patient continues to use over-the-counter anti-inflammatory medications to treat the pain and related symptoms but this has not given the patient relief. The patient notes the pain in the lower extremities is causing problems with daily exercise, problems at work and even with household activities such as preparing meals and doing dishes.  The patient is otherwise done well and there have been no complications related to the laser procedure or interval changes in the patient's overall   Post laser ultrasound shows successful ablation of the bilateral GSV    Current Meds  Medication Sig  . ALPRAZolam (XANAX) 0.25 MG tablet Take 1 tablet (0.25 mg total) by mouth 2 (two) times daily as needed for anxiety.  . ALPRAZolam (XANAX) 0.5 MG tablet TAKE 1 TABLET BY MOUTH 1 HOUR PRIOR TO PROCEDURE AND 1 TABLET UPON ARRIVAL AT OFFICE  . Ascorbic Acid (VITAMIN C) 1000 MG tablet Take 1,000 mg by mouth daily.  . Calcium Carb-Cholecalciferol [phone] MG-UNIT TABS Take by mouth.  . calcium carbonate (OSCAL) 1500 (600 Ca) MG TABS tablet Take by mouth 2 (two) times daily with a meal.  . cholecalciferol (VITAMIN D) 1000 units tablet Take 1,000  Units by mouth daily.  . Fluticasone-Salmeterol (ADVAIR) 250-50 MCG/DOSE AEPB Inhale 1 puff into the lungs 2 (two) times daily.  . furosemide (LASIX) 20 MG tablet Take 1 tablet (20 mg total) by mouth daily as needed.  . hydrochlorothiazide (HYDRODIURIL) 25 MG tablet Take 1 tablet (25 mg total) by mouth daily.  Marland Kitchen latanoprost (XALATAN) 0.005 % ophthalmic solution 1 drop at bedtime.  Marland Kitchen levothyroxine (SYNTHROID, LEVOTHROID) 75 MCG tablet Take 1 tablet (75 mcg total) by mouth daily before breakfast.  . loratadine (CLARITIN) 10 MG tablet Take 10 mg by mouth daily.  Marland Kitchen losartan (COZAAR) 25 MG tablet Take 1 tablet (25 mg total) by mouth daily.  Marland Kitchen MAGNESIUM-ZINC PO Take 351 mg by mouth at bedtime as needed.  . Melatonin-Theanine (MELATONIN FORTE/L-THEANINE PO) Take 200 mg by mouth at bedtime as needed.  . Meloxicam 10 MG CAPS Take by mouth.  . metoprolol tartrate (LOPRESSOR) 25 MG tablet Take 1 tablet (25 mg total) by mouth 2 (two) times daily.  . Multiple Vitamin (MULTI-VITAMINS) TABS Take by mouth.  . multivitamin-lutein (OCUVITE-LUTEIN) CAPS capsule Take 1 capsule by mouth daily.  . Omega-3 1000 MG CAPS Take by mouth.  . predniSONE (DELTASONE) 20 MG tablet Take 1 tablets (20 mg) daily by mouth  . Rivaroxaban (XARELTO) 15 MG TABS tablet Take 15 mg by mouth 2 (two) times daily with a meal.  . traMADol (ULTRAM) 50 MG tablet Take by mouth.    Past Medical History:  Diagnosis Date  . Asthma   . Atrial fibrillation ([place])   .  Hypertension   . Peripheral arterial disease [facility]     Past Surgical History:  Procedure Laterality Date  . ABDOMINAL HYSTERECTOMY  1991   uterus only- still has cervix - done for prolapse  . COLONOSCOPY  2009   normal  . TOE SURGERY      Social History Social History   Tobacco Use  . Smoking status: Former Smoker    Packs/day: 0.50    Years: 29.00    Pack years: 14.50    Types: Cigarettes    Last attempt to quit: 09/28/1989    Years since quitting: 28.7  .  Smokeless tobacco: Never Used  . Tobacco comment: smoking cessation materials not required  Substance Use Topics  . Alcohol use: No    Frequency: Never  . Drug use: No    Family History Family History  Problem Relation Age of Onset  . Breast cancer Other 56  . Dementia Mother   . Cancer Father        prostate and bone  . Heart attack Sister     Allergies  Allergen Reactions  . Tramadol Shortness Of Breath  . Meloxicam Rash     REVIEW OF SYSTEMS (Negative unless checked)  Constitutional: [] Weight loss  [] Fever  [] Chills Cardiac: [] Chest pain   [] Chest pressure   [] Palpitations   [] Shortness of breath when laying flat   [] Shortness of breath with exertion. Vascular:  [] Pain in legs with walking   [x] Pain in legs with dependency [] History of DVT   [] Phlebitis   [x] Swelling in legs   [x] Varicose veins   [] Non-healing ulcers Pulmonary:   [] Uses home oxygen   [] Productive cough   [] Hemoptysis   [] Wheeze  [] COPD   [] Asthma Neurologic:  [] Dizziness   [] Seizures   [] History of stroke   [] History of TIA  [] Aphasia   [] Vissual changes   [] Weakness or numbness in arm   [] Weakness or numbness in leg Musculoskeletal:   [] Joint swelling   [] Joint pain   [] Low back pain Hematologic:  [] Easy bruising  [] Easy bleeding   [] Hypercoagulable state   [] Anemic Gastrointestinal:  [] Diarrhea   [] Vomiting  [] Gastroesophageal reflux/heartburn   [] Difficulty swallowing. Genitourinary:  [] Chronic kidney disease   [] Difficult urination  [] Frequent urination   [] Blood in urine Skin:  [] Rashes   [] Ulcers  Psychological:  [] History of anxiety   []  History of major depression.  Physical Examination  Vitals:   07/03/18 1309  BP: (!) 151/82  Pulse: 69  Resp: 18  Weight: 200 lb (90.7 kg)  Height: 5\' 4"  (1.626 m)   Body mass index is 34.33 kg/m. Gen: WD/WN, NAD Head: [place]/AT, No temporalis wasting.  Ear/Nose/Throat: Hearing grossly intact, nares w/o erythema or drainage Eyes: PER, EOMI, sclera  nonicteric.  Neck: Supple, no large masses.   Pulmonary:  Good air movement, no audible wheezing bilaterally, no use of accessory muscles.  Cardiac: RRR, no JVD Vascular: Large varicosities present extensively greater than 6 mm bilaterally.  Mild venous stasis changes to the legs bilaterally.  2+ soft pitting edema Vessel Right Left  Radial Palpable Palpable  Gastrointestinal: Non-distended. No guarding/no peritoneal signs.  Musculoskeletal: M/S 5/5 throughout.  No deformity or atrophy.  Neurologic: CN 2-12 intact. Symmetrical.  Speech is fluent. Motor exam as listed above. Psychiatric: Judgment intact, Mood & affect appropriate for pt's clinical situation. Dermatologic: No rashes or ulcers noted.  No changes consistent with cellulitis. Lymph : No lichenification or skin changes of chronic lymphedema.  CBC Lab Results  Component Value Date   WBC 10.1 05/25/2018   HGB 14.1 05/25/2018   HCT 42.1 05/25/2018   MCV 88 05/25/2018   PLT 278 05/25/2018    BMET    Component Value Date/Time   NA 134 05/25/2018 1108   K 4.2 05/25/2018 1108   CL 88 (L) 05/25/2018 1108   CO2 28 05/25/2018 1108   GLUCOSE 118 (H) 05/25/2018 1108   BUN 18 05/25/2018 1108   CREATININE 0.90 05/25/2018 1108   CALCIUM 9.3 05/25/2018 1108   GFRNONAA 61 05/25/2018 1108   GFRAA 71 05/25/2018 1108   CrCl cannot be calculated (Patient's most recent lab result is older than the maximum 21 days allowed.).  COAG No results found for: INR, PROTIME  Radiology Vas [place] Laser Ablation Of Superficial Vei  Result Date: 06/13/2018  Lower Venous Study Indications: Varicosities. Other Indications: Left GSV ablation on 05/12/18; Right GSV ablation on 06/09/18. Performing Technologist: Blondell Reveal RT, RDMS, RVT  Examination Guidelines: A complete evaluation includes B-mode imaging, spectral Doppler, color Doppler, and power Doppler as needed of all accessible portions of each vessel. Bilateral testing is considered an  integral part of a complete examination. Limited examinations for reoccurring indications may be performed as noted.  Right Venous Findings: +---------+---------------+---------+-----------+----------+-------+          CompressibilityPhasicitySpontaneityPropertiesSummary +---------+---------------+---------+-----------+----------+-------+ CFV      Full           Yes      Yes                          +---------+---------------+---------+-----------+----------+-------+ SFJ      Full                                                 +---------+---------------+---------+-----------+----------+-------+ FV Prox  Full                                                 +---------+---------------+---------+-----------+----------+-------+ FV Mid   Full           Yes      Yes                          +---------+---------------+---------+-----------+----------+-------+ FV DistalFull                                                 +---------+---------------+---------+-----------+----------+-------+ POP      Full           Yes      Yes                          +---------+---------------+---------+-----------+----------+-------+ GSV      Partial                                              +---------+---------------+---------+-----------+----------+-------+ SSV      Full                                                 +---------+---------------+---------+-----------+----------+-------+  Right Findings: The GSV is appears minimally/partially-occlusive focally at the proximal knee region with the remainder of the vessel appearing widely patent.  Summary: Right: There is no evidence of deep vein thrombosis in the lower extremity. Patent right great saphenous vein, as described above.  *See table(s) above for measurements and observations. Electronically signed by Leotis Pain MD on 06/13/2018 at 12:00:19 PM.    Final      Assessment/Plan 1. Varicose veins of both lower  extremities with pain Recommend:  The patient has had successful ablation of the previously incompetent saphenous venous system but still has persistent symptoms of pain and swelling that are having a negative impact on daily life and daily activities.  Patient should undergo injection sclerotherapy to treat the residual varicosities.  The risks, benefits and alternative therapies were reviewed in detail with the patient.  All questions were answered.  The patient agrees to proceed with sclerotherapy at their convenience.  The patient will continue wearing the graduated compression stockings and using the over-the-counter pain medications to treat her symptoms.     2. Essential hypertension Continue antihypertensive medications as already ordered, these medications have been reviewed and there are no changes at this time.   3. Chronic atrial fibrillation Continue antiarrhythmia medications as already ordered, these medications have been reviewed and there are no changes at this time.  Continue anticoagulation as ordered by Cardiology Service   4. Mixed hyperlipidemia Continue statin as ordered and reviewed, no changes at this time     Hortencia Pilar, MD  07/03/2018 1:17 PM

## 2018-07-27 ENCOUNTER — Telehealth (INDEPENDENT_AMBULATORY_CARE_PROVIDER_SITE_OTHER): Payer: Self-pay | Admitting: Vascular Surgery

## 2018-07-27 NOTE — Telephone Encounter (Signed)
Ariana Guzman had sclerotherapy done on 02/13/2018 with Maudie Mercury.  Please give her a call to let her know that sclerotherapy is exactly the same thing that she had done with Maudie Mercury on this day.  It is a process where we are able to treat varicose veins and spider veins by using a series of very small injections with saline solution to help collapse the veins.  We use a very small insulin needle to do the injections.  She generally expect to have some redness and irritation at the site.  This generally takes approximately 2 to 4 weeks to heal.  After sclerotherapy is done the veins appear lighter and for bigger veins they become smaller.  Procedure normally takes no more than about 30 to 40 minutes.  This is done in our office and there is no anesthesia.  There is no downtime following the procedure.  This is also something that she does not have to have done.  If she is not comfortable having this done than this is something that we can revisit at a later time as well.  She feels that she needs further detailed instructions on what the procedure is then she can come in and schedule an appointment as it may take 15 to 20 minutes to fully explain this to the patient she is still unsure as to what the procedure entails.

## 2018-07-28 NOTE — Telephone Encounter (Signed)
Called the patient back to let her know what her options were moving forward, and I also let her know that the sclero is just a follow up to the laser that she has already had, but that she can refuse to have the sclero if it's not what she wants to do anymore.

## 2018-07-31 NOTE — Telephone Encounter (Signed)
Patient's son called wanting a clearer understanding of what is to happen going forward after the laser and I explained that the patient will be scheduled for Sclerotherapy once insurance is approved.

## 2018-08-02 ENCOUNTER — Ambulatory Visit
Admission: RE | Admit: 2018-08-02 | Discharge: 2018-08-02 | Disposition: A | Discharge status: Home / Self Care | Payer: Medicare Other | Source: Ambulatory Visit | Attending: Internal Medicine | Admitting: Internal Medicine

## 2018-08-02 DIAGNOSIS — Z1231 Encounter for screening mammogram for malignant neoplasm of breast: Secondary | ICD-10-CM | POA: Diagnosis not present

## 2018-08-21 ENCOUNTER — Encounter (INDEPENDENT_AMBULATORY_CARE_PROVIDER_SITE_OTHER): Payer: Self-pay | Admitting: Vascular Surgery

## 2018-08-21 ENCOUNTER — Ambulatory Visit (INDEPENDENT_AMBULATORY_CARE_PROVIDER_SITE_OTHER): Payer: Medicare Other | Admitting: Vascular Surgery

## 2018-08-21 VITALS — BP 186/88 | HR 94 | Resp 16 | Ht 67.0 in | Wt 198.0 lb

## 2018-08-21 DIAGNOSIS — I83813 Varicose veins of bilateral lower extremities with pain: Secondary | ICD-10-CM

## 2018-08-21 NOTE — Progress Notes (Signed)
Varicose veins of bilateral  lower extremity with inflammation (454.1  I83.10) Current Plans   Indication: Patient presents with symptomatic varicose veins of the bilateral  lower extremity.   Procedure: Sclerotherapy using hypertonic saline mixed with 1% Lidocaine was performed on the bilateral lower extremity. Compression wraps were placed. The patient tolerated the procedure well. 

## 2018-09-03 ENCOUNTER — Encounter: Payer: Self-pay | Admitting: Internal Medicine

## 2018-09-04 ENCOUNTER — Ambulatory Visit (INDEPENDENT_AMBULATORY_CARE_PROVIDER_SITE_OTHER): Payer: Medicare Other | Admitting: Internal Medicine

## 2018-09-04 ENCOUNTER — Encounter: Payer: Self-pay | Admitting: Internal Medicine

## 2018-09-04 VITALS — BP 136/82 | HR 77 | Temp 97.8°F | Ht 67.0 in | Wt 193.0 lb

## 2018-09-04 DIAGNOSIS — H6690 Otitis media, unspecified, unspecified ear: Secondary | ICD-10-CM | POA: Diagnosis not present

## 2018-09-04 DIAGNOSIS — I83813 Varicose veins of bilateral lower extremities with pain: Secondary | ICD-10-CM

## 2018-09-04 DIAGNOSIS — R7303 Prediabetes: Secondary | ICD-10-CM | POA: Diagnosis not present

## 2018-09-04 DIAGNOSIS — E039 Hypothyroidism, unspecified: Secondary | ICD-10-CM | POA: Diagnosis not present

## 2018-09-04 DIAGNOSIS — F5104 Psychophysiologic insomnia: Secondary | ICD-10-CM

## 2018-09-04 DIAGNOSIS — I482 Chronic atrial fibrillation, unspecified: Secondary | ICD-10-CM

## 2018-09-04 DIAGNOSIS — I10 Essential (primary) hypertension: Secondary | ICD-10-CM | POA: Diagnosis not present

## 2018-09-04 MED ORDER — AZITHROMYCIN 250 MG PO TABS: ORAL_TABLET | ORAL | 0 refills | Status: AC

## 2018-09-04 NOTE — Patient Instructions (Signed)
Take Belsomra in place of Melatonin and Xanax for sleep.  Take the medication just 15 minutes before going to bed.

## 2018-09-04 NOTE — Progress Notes (Signed)
Date:  09/04/2018   Name:  Ariana Guzman   DOB:  1941/05/03   MRN:  [id number]   Chief Complaint: Diabetes; Insomnia (Wants to know if her Xanax can be increased slightly. Said she is used to taking 1mg  at night with melatonin for sleep. Only sleeping for 4 hours and then waking up. ); and Ear Pain (Left ear pain. Started 3 days ago. Feels off balance and dizzy when walking. )  Diabetes  She presents for her follow-up diabetic visit. She has type 2 diabetes mellitus. Her disease course has been stable. Hypoglycemia symptoms include dizziness. Pertinent negatives for hypoglycemia include no headaches. Pertinent negatives for diabetes include no chest pain and no fatigue. Symptoms are stable. An ACE inhibitor/angiotensin II receptor blocker is being taken.  Insomnia  Primary symptoms: sleep disturbance, frequent awakening.  The problem occurs nightly. The problem is unchanged. Relieved by: takes melatonin and xanax 0.25 mg.  Hypertension  This is a chronic problem. The problem is controlled. Pertinent negatives include no chest pain, headaches, palpitations or shortness of breath. Identifiable causes of hypertension include a thyroid problem.  Thyroid Problem  Symptoms include constipation. Patient reports no diarrhea, fatigue or palpitations.  Otalgia   There is pain in the left ear. This is a new problem. The current episode started in the past 7 days. The problem occurs constantly. The problem has been unchanged. There has been no fever. The pain is mild. Associated symptoms include rhinorrhea. Pertinent negatives include no abdominal pain, diarrhea, ear discharge, headaches, hearing loss, sore throat or vomiting. She has tried nothing for the symptoms.  Chronic Afib - rate controlled, on anti-coagulation.  Minimal sx currently. Followed by cardiology. Varicose veins - has 2 sites injected by [facility].  Still has small area of old blood under the skin and oozing slightly.  Very tender but  unchanged, concerned about possible infection.  Lab Results  Component Value Date   TSH 1.910 05/25/2018   Lab Results  Component Value Date   CREATININE 0.90 05/25/2018   BUN 18 05/25/2018   NA 134 05/25/2018   K 4.2 05/25/2018   CL 88 (L) 05/25/2018   CO2 28 05/25/2018   Lab Results  Component Value Date   HGBA1C 6.8 (H) 05/25/2018     Review of Systems  Constitutional: Negative for chills, fatigue and fever.  HENT: Positive for ear pain, postnasal drip, rhinorrhea and sinus pressure. Negative for ear discharge, hearing loss and sore throat.   Respiratory: Negative for chest tightness, shortness of breath and wheezing.   Cardiovascular: Positive for leg swelling. Negative for chest pain and palpitations.  Gastrointestinal: Positive for constipation. Negative for abdominal pain, blood in stool, diarrhea, rectal pain and vomiting.  Musculoskeletal: Positive for arthralgias (and varicose vein pain).  Skin: Positive for wound (2 places that were injected by [facility]).  Neurological: Positive for dizziness. Negative for light-headedness and headaches.  Hematological: Negative for adenopathy.  Psychiatric/Behavioral: Positive for sleep disturbance. The patient has insomnia.     Patient Active Problem List   Diagnosis Date Noted  . Pre-diabetes 05/29/2018  . Chronic insomnia 09/15/2017  . Essential hypertension 09/09/2017  . 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 Bilateral lower extremity edema 09/09/2017  . Pain in both lower extremities 09/09/2017  . Current use of long term anticoagulation 07/28/2017  . 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 Otitis externa, chronic 12/03/2015  . Obstructive sleep apnea 10/07/2015  . Asthma,  persistent controlled 09/25/2015  . Acquired hypothyroidism 02/29/2012  . Chronic atrial fibrillation 02/29/2012  . Hyperlipidemia 02/29/2012    Allergies  Allergen Reactions  . Tramadol Shortness  Of Breath  . Meloxicam Rash    Past Surgical History:  Procedure Laterality Date  . ABDOMINAL HYSTERECTOMY  1991   uterus only- still has cervix - done for prolapse  . COLONOSCOPY  2009   normal  . TOE SURGERY      Social History   Tobacco Use  . Smoking status: Former Smoker    Packs/day: 0.50    Years: 29.00    Pack years: 14.50    Types: Cigarettes    Last attempt to quit: 09/28/1989    Years since quitting: 28.9  . Smokeless tobacco: Never Used  . Tobacco comment: smoking cessation materials not required  Substance Use Topics  . Alcohol use: No    Frequency: Never  . Drug use: No     Medication list has been reviewed and updated.  Current Meds  Medication Sig  . albuterol (PROVENTIL) (2.5 MG/3ML) 0.083% nebulizer solution Inhale into the lungs.  . ALPRAZolam (XANAX) 0.25 MG tablet Take 1 tablet (0.25 mg total) by mouth 2 (two) times daily as needed for anxiety.  . Ascorbic Acid (VITAMIN C) 1000 MG tablet Take 1,000 mg by mouth daily.  . Calcium Carb-Cholecalciferol [phone] MG-UNIT TABS Take by mouth.  . cholecalciferol (VITAMIN D) 1000 units tablet Take 1,000 Units by mouth daily.  . Fluticasone-Salmeterol (ADVAIR) 250-50 MCG/DOSE AEPB Inhale 1 puff into the lungs 2 (two) times daily.  . furosemide (LASIX) 20 MG tablet Take 1 tablet (20 mg total) by mouth daily as needed.  . hydrochlorothiazide (HYDRODIURIL) 25 MG tablet Take 1 tablet (25 mg total) by mouth daily.  Marland Kitchen latanoprost (XALATAN) 0.005 % ophthalmic solution 1 drop at bedtime.  Marland Kitchen levothyroxine (SYNTHROID, LEVOTHROID) 75 MCG tablet Take 1 tablet (75 mcg total) by mouth daily before breakfast.  . loratadine (CLARITIN) 10 MG tablet Take 10 mg by mouth daily.  Marland Kitchen losartan (COZAAR) 25 MG tablet Take 1 tablet (25 mg total) by mouth daily.  Marland Kitchen MAGNESIUM-ZINC PO Take 351 mg by mouth at bedtime as needed.  . Melatonin 5 MG TABS Take by mouth.  . metoprolol tartrate (LOPRESSOR) 25 MG tablet Take 1 tablet (25 mg  total) by mouth 2 (two) times daily.  . multivitamin-lutein (OCUVITE-LUTEIN) CAPS capsule Take 1 capsule by mouth daily.  . Rivaroxaban (XARELTO) 15 MG TABS tablet Take 15 mg by mouth 2 (two) times daily with a meal.    PHQ 2/9 Scores 09/04/2018 04/24/2018 11/22/2017  PHQ - 2 Score 0 0 0  PHQ- 9 Score - 0 -   Wt Readings from Last 3 Encounters:  09/04/18 193 lb (87.5 kg)  08/21/18 198 lb (89.8 kg)  07/03/18 200 lb (90.7 kg)    Physical Exam Vitals signs and nursing note reviewed.  Constitutional:      General: She is not in acute distress.    Appearance: She is well-developed.  HENT:     Head: Normocephalic and atraumatic.     Right Ear: Ear canal normal. Tympanic membrane is not perforated or erythematous.     Left Ear: Ear canal normal. Tenderness present. Tympanic membrane is not perforated or erythematous.     Ears:     Comments: Left TM dull    Mouth/Throat:     Mouth: Mucous membranes are moist.  Neck:     Musculoskeletal: Normal  range of motion and neck supple.     Vascular: No carotid bruit.  Cardiovascular:     Rate and Rhythm: Normal rate. Rhythm irregularly irregular.     Pulses: Normal pulses.  Pulmonary:     Effort: Pulmonary effort is normal. No respiratory distress.     Breath sounds: Normal breath sounds.  Musculoskeletal: Normal range of motion.     Right lower leg: Edema present.     Left lower leg: Edema present.  Lymphadenopathy:     Cervical: No cervical adenopathy.  Skin:    General: Skin is warm and dry.     Findings: No rash.       Neurological:     Mental Status: She is alert and oriented to person, place, and time.  Psychiatric:        Behavior: Behavior normal.        Thought Content: Thought content normal.     BP 136/82   Pulse 77   Temp 97.8 F (36.6 C) (Oral)   Ht 5\' 7"  (1.702 m)   Wt 193 lb (87.5 kg)   SpO2 98%   BMI 30.23 kg/m   Assessment and Plan: 1. Essential hypertension Controlled - CBC with Differential/Platelet -  Comprehensive metabolic panel  2. Acute otitis media, unspecified otitis media type Early infection suspected - azithromycin (ZITHROMAX Z-PAK) 250 MG tablet; UAD  Dispense: 6 each; Refill: 0  3. Varicose veins of both lower extremities with pain S/p vein injection with mild site erythema but no overt signs of infection Pt reassured, keep area covered and avoid further trauma  4. Acquired hypothyroidism supplemented - TSH  5. Pre-diabetes Doing very well with diet and weight loss - Hemoglobin A1c  6. Chronic insomnia Declined to give higher dose xanax - would prefer she was off of this completely Samples of Belsomra 10 mg qhs  7. Chronic atrial fibrillation On DOAC, followed by cardiology Rate controlled by beta blocker   Partially dictated using Editor, commissioning. Any errors are unintentional.  Halina Maidens, MD [facility]  09/04/2018

## 2018-09-05 LAB — COMPREHENSIVE METABOLIC PANEL
ALBUMIN: 4 g/dL (ref 3.7–4.7)
ALT: 13 IU/L (ref 0–32)
AST: 15 IU/L (ref 0–40)
Albumin/Globulin Ratio: 1.6 (ref 1.2–2.2)
Alkaline Phosphatase: 54 IU/L (ref 39–117)
BUN / CREAT RATIO: 17 (ref 12–28)
BUN: 13 mg/dL (ref 8–27)
Bilirubin Total: 1.4 mg/dL — ABNORMAL HIGH (ref 0.0–1.2)
CALCIUM: 9.6 mg/dL (ref 8.7–10.3)
CO2: 27 mmol/L (ref 20–29)
CREATININE: 0.75 mg/dL (ref 0.57–1.00)
Chloride: 88 mmol/L — ABNORMAL LOW (ref 96–106)
GFR calc Af Amer: 88 mL/min/{1.73_m2} (ref >59)
GFR, EST NON AFRICAN AMERICAN: 76 mL/min/{1.73_m2} (ref >59)
GLOBULIN, TOTAL: 2.5 g/dL (ref 1.5–4.5)
GLUCOSE: 99 mg/dL (ref 65–99)
Potassium: 4 mmol/L (ref 3.5–5.2)
SODIUM: 133 mmol/L — AB (ref 134–144)
Total Protein: 6.5 g/dL (ref 6.0–8.5)

## 2018-09-05 LAB — CBC WITH DIFFERENTIAL/PLATELET
Basophils Absolute: 0.1 10*3/uL (ref 0.0–0.2)
Basos: 1 %
EOS (ABSOLUTE): 0.8 10*3/uL — ABNORMAL HIGH (ref 0.0–0.4)
EOS: 8 %
HEMATOCRIT: 41.7 % (ref 34.0–46.6)
HEMOGLOBIN: 14 g/dL (ref 11.1–15.9)
IMMATURE GRANULOCYTES: 0 %
Immature Grans (Abs): 0 10*3/uL (ref 0.0–0.1)
LYMPHS: 26 %
Lymphocytes Absolute: 2.4 10*3/uL (ref 0.7–3.1)
MCH: 29.5 pg (ref 26.6–33.0)
MCHC: 33.6 g/dL (ref 31.5–35.7)
MCV: 88 fL (ref 79–97)
Monocytes Absolute: 1 10*3/uL — ABNORMAL HIGH (ref 0.1–0.9)
Monocytes: 11 %
NEUTROS PCT: 54 %
Neutrophils Absolute: 5 10*3/uL (ref 1.4–7.0)
Platelets: 286 10*3/uL (ref 150–450)
RBC: 4.75 x10E6/uL (ref 3.77–5.28)
RDW: 13 % (ref 11.7–15.4)
WBC: 9.3 10*3/uL (ref 3.4–10.8)

## 2018-09-05 LAB — HEMOGLOBIN A1C
ESTIMATED AVERAGE GLUCOSE: 137 mg/dL
Hgb A1c MFr Bld: 6.4 % — ABNORMAL HIGH (ref 4.8–5.6)

## 2018-09-05 LAB — TSH: TSH: 1.88 u[IU]/mL (ref 0.450–4.500)

## 2018-09-06 NOTE — Progress Notes (Signed)
Called and left Vm for patient informing labs are normal and prediabetes is better. Told to call back with any questions.

## 2018-09-14 DIAGNOSIS — L821 Other seborrheic keratosis: Secondary | ICD-10-CM | POA: Diagnosis not present

## 2018-09-14 DIAGNOSIS — L720 Epidermal cyst: Secondary | ICD-10-CM | POA: Diagnosis not present

## 2018-09-18 ENCOUNTER — Encounter (INDEPENDENT_AMBULATORY_CARE_PROVIDER_SITE_OTHER): Payer: Self-pay | Admitting: Vascular Surgery

## 2018-09-18 ENCOUNTER — Ambulatory Visit (INDEPENDENT_AMBULATORY_CARE_PROVIDER_SITE_OTHER): Payer: Medicare Other | Admitting: Vascular Surgery

## 2018-09-18 VITALS — BP 158/79 | HR 77 | Resp 14 | Ht 67.0 in | Wt 199.4 lb

## 2018-09-18 DIAGNOSIS — I83813 Varicose veins of bilateral lower extremities with pain: Secondary | ICD-10-CM | POA: Diagnosis not present

## 2018-09-18 NOTE — Progress Notes (Signed)
Varicose veins of bilateral  lower extremity with inflammation (454.1  I83.10) Current Plans   Indication: Patient presents with symptomatic varicose veins of the bilateral  lower extremity.   Procedure: Sclerotherapy using hypertonic saline mixed with 1% Lidocaine was performed on the bilateral lower extremity. Compression wraps were placed. The patient tolerated the procedure well. 

## 2018-09-25 ENCOUNTER — Ambulatory Visit (INDEPENDENT_AMBULATORY_CARE_PROVIDER_SITE_OTHER): Payer: Medicare Other | Admitting: Internal Medicine

## 2018-09-25 ENCOUNTER — Encounter: Payer: Self-pay | Admitting: Internal Medicine

## 2018-09-25 VITALS — BP 124/80 | HR 81 | Ht 67.0 in | Wt 202.0 lb

## 2018-09-25 DIAGNOSIS — I482 Chronic atrial fibrillation, unspecified: Secondary | ICD-10-CM | POA: Diagnosis not present

## 2018-09-25 DIAGNOSIS — J0100 Acute maxillary sinusitis, unspecified: Secondary | ICD-10-CM

## 2018-09-25 DIAGNOSIS — K1370 Unspecified lesions of oral mucosa: Secondary | ICD-10-CM | POA: Diagnosis not present

## 2018-09-25 DIAGNOSIS — G4733 Obstructive sleep apnea (adult) (pediatric): Secondary | ICD-10-CM | POA: Diagnosis not present

## 2018-09-25 MED ORDER — AMOXICILLIN 875 MG PO TABS: 875.0000 mg | ORAL_TABLET | Freq: Two times a day (BID) | ORAL | 0 refills | Status: AC

## 2018-09-25 NOTE — Progress Notes (Signed)
Date:  09/25/2018   Name:  Ariana Guzman   DOB:  [birth year]   MRN:  [id number]   Chief Complaint: Sore Throat (Patient c/o throat being sore. Says the back of her throat started bleeding last night when she was brushing her teeth. Feels like blisters are in the back of her throat. Left ear ache. No drainage. Facial pain on left side of face around left eye brow.  ) and Atrial Fibrillation  Sore Throat   This is a new problem. The current episode started in the past 7 days. The problem has been unchanged. The pain is worse on the left side. There has been no fever. Associated symptoms include congestion, coughing and a plugged ear sensation. Pertinent negatives include no diarrhea, ear discharge, headaches, shortness of breath, swollen glands or trouble swallowing. Treatments tried: flonase spray.  OSA - CPAP nightly, sleeps well with no morning headaches or daytime sleepiness.  Her mouth is very dry in the AM despite humidifier active.  Atrial fibrillation - chronic rate controlled and anticoagulated on NOAC. She is having more fatigue and feels that her HR slows significantly at night.  She is due to see Cardiology in April or May for routine follow up.  Oral lesion - bleed some last evening after eating potato chips.  The bleeding stopped on it own and has not recurred.    Review of Systems  Constitutional: Positive for fatigue. Negative for chills, fever and unexpected weight change.  HENT: Positive for congestion, mouth sores (bleeding last night after eating potato chips) and sore throat. Negative for ear discharge and trouble swallowing.   Respiratory: Positive for cough and wheezing. Negative for chest tightness and shortness of breath.   Cardiovascular: Positive for palpitations. Negative for chest pain and leg swelling.  Gastrointestinal: Negative for diarrhea.  Neurological: Negative for dizziness, light-headedness and headaches.    Patient Active Problem List   Diagnosis  Date Noted  . Pre-diabetes 05/29/2018  . Chronic insomnia 09/15/2017  . Essential hypertension 09/09/2017  . 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 Bilateral lower extremity edema 09/09/2017  . Pain in both lower extremities 09/09/2017  . Current use of long term anticoagulation 07/28/2017  . 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 Otitis externa, chronic 12/03/2015  . Obstructive sleep apnea 10/07/2015  . Asthma, persistent controlled 09/25/2015  . Acquired hypothyroidism 02/29/2012  . Chronic atrial fibrillation 02/29/2012  . Hyperlipidemia 02/29/2012    Allergies  Allergen Reactions  . Tramadol Shortness Of Breath  . Meloxicam Rash    Past Surgical History:  Procedure Laterality Date  . ABDOMINAL HYSTERECTOMY  1991   uterus only- still has cervix - done for prolapse  . COLONOSCOPY  2009   normal  . TOE SURGERY      Social History   Tobacco Use  . Smoking status: Former Smoker    Packs/day: 0.50    Years: 29.00    Pack years: 14.50    Types: Cigarettes    Last attempt to quit: 09/28/1989    Years since quitting: 29.0  . Smokeless tobacco: Never Used  . Tobacco comment: smoking cessation materials not required  Substance Use Topics  . Alcohol use: No    Frequency: Never  . Drug use: No     Medication list has been reviewed and updated.  Current Meds  Medication Sig  . albuterol (PROVENTIL) (2.5 MG/3ML) 0.083% nebulizer solution Inhale into the lungs.  Marland Kitchen  ALPRAZolam (XANAX) 0.25 MG tablet Take 1 tablet (0.25 mg total) by mouth 2 (two) times daily as needed for anxiety.  . Ascorbic Acid (VITAMIN C) 1000 MG tablet Take 1,000 mg by mouth daily.  . Calcium Carb-Cholecalciferol [phone] MG-UNIT TABS Take by mouth.  . cholecalciferol (VITAMIN D) 1000 units tablet Take 1,000 Units by mouth daily.  . Fluticasone-Salmeterol (ADVAIR) 250-50 MCG/DOSE AEPB Inhale 1 puff into the lungs 2 (two) times daily.  . furosemide  (LASIX) 20 MG tablet Take 1 tablet (20 mg total) by mouth daily as needed.  . hydrochlorothiazide (HYDRODIURIL) 25 MG tablet Take 1 tablet (25 mg total) by mouth daily.  Marland Kitchen latanoprost (XALATAN) 0.005 % ophthalmic solution 1 drop at bedtime.  Marland Kitchen levothyroxine (SYNTHROID, LEVOTHROID) 75 MCG tablet Take 1 tablet (75 mcg total) by mouth daily before breakfast.  . loratadine (CLARITIN) 10 MG tablet Take 10 mg by mouth daily.  Marland Kitchen losartan (COZAAR) 25 MG tablet Take 1 tablet (25 mg total) by mouth daily.  Marland Kitchen MAGNESIUM-ZINC PO Take 351 mg by mouth at bedtime as needed.  . Melatonin 5 MG TABS Take by mouth.  . metoprolol tartrate (LOPRESSOR) 25 MG tablet Take 1 tablet (25 mg total) by mouth 2 (two) times daily.  . multivitamin-lutein (OCUVITE-LUTEIN) CAPS capsule Take 1 capsule by mouth daily.  . Rivaroxaban (XARELTO) 15 MG TABS tablet Take 15 mg by mouth 2 (two) times daily with a meal.    PHQ 2/9 Scores 09/04/2018 04/24/2018 11/22/2017  PHQ - 2 Score 0 0 0  PHQ- 9 Score - 0 -    Physical Exam Constitutional:      Appearance: She is well-developed.  HENT:     Right Ear: Ear canal and external ear normal. Tympanic membrane is not erythematous or retracted.     Left Ear: Ear canal and external ear normal. Tympanic membrane is retracted. Tympanic membrane is not erythematous.     Nose:     Right Sinus: No maxillary sinus tenderness or frontal sinus tenderness.     Left Sinus: Maxillary sinus tenderness present. No frontal sinus tenderness.     Mouth/Throat:     Mouth: No oral lesions.     Pharynx: Uvula midline. Posterior oropharyngeal erythema present. No oropharyngeal exudate.   Cardiovascular:     Rate and Rhythm: Normal rate and regular rhythm.     Heart sounds: Normal heart sounds.  Pulmonary:     Breath sounds: Wheezing present. No rales.  Lymphadenopathy:     Cervical: No cervical adenopathy.  Neurological:     Mental Status: She is alert and oriented to person, place, and time.     BP  124/80   Pulse 81   Ht 5\' 7"  (1.702 m)   Wt 202 lb (91.6 kg)   SpO2 97%   BMI 31.64 kg/m   Assessment and Plan: 1. Acute non-recurrent maxillary sinusitis Continue Flonase - amoxicillin (AMOXIL) 875 MG tablet; Take 1 tablet (875 mg total) by mouth 2 (two) times daily for 10 days.  Dispense: 20 tablet; Refill: 0  2. Chronic atrial fibrillation Stable rate but with worsening fatigue Recommend follow up soon with cardiology  3. Obstructive sleep apnea Doing well Increase the humidity level to max  4. Lesion of mouth Small abrasion from food Should heal with out issues   Partially dictated using Editor, commissioning. Any errors are unintentional.  Halina Maidens, MD [facility]  09/25/2018

## 2018-10-03 ENCOUNTER — Encounter (INDEPENDENT_AMBULATORY_CARE_PROVIDER_SITE_OTHER): Payer: Self-pay | Admitting: Vascular Surgery

## 2018-10-03 ENCOUNTER — Ambulatory Visit (INDEPENDENT_AMBULATORY_CARE_PROVIDER_SITE_OTHER): Payer: Medicare Other | Admitting: Vascular Surgery

## 2018-10-03 VITALS — BP 143/82 | HR 78 | Resp 12 | Ht 67.0 in | Wt 200.0 lb

## 2018-10-03 DIAGNOSIS — I83813 Varicose veins of bilateral lower extremities with pain: Secondary | ICD-10-CM | POA: Diagnosis not present

## 2018-10-03 NOTE — Progress Notes (Signed)
Ariana Guzman is a 78 y.o.female who presents with painful varicose veins of the right leg  Past Medical History:  Diagnosis Date  . Asthma   . Atrial fibrillation ([place])   . Hypertension   . Peripheral arterial disease [facility])     Past Surgical History:  Procedure Laterality Date  . ABDOMINAL HYSTERECTOMY  1991   uterus only- still has cervix - done for prolapse  . COLONOSCOPY  2009   normal  . TOE SURGERY      Current Outpatient Medications  Medication Sig Dispense Refill  . albuterol (PROVENTIL) (2.5 MG/3ML) 0.083% nebulizer solution Inhale into the lungs.    . ALPRAZolam (XANAX) 0.25 MG tablet Take 1 tablet (0.25 mg total) by mouth 2 (two) times daily as needed for anxiety. 30 tablet 5  . amoxicillin (AMOXIL) 875 MG tablet Take 1 tablet (875 mg total) by mouth 2 (two) times daily for 10 days. 20 tablet 0  . Ascorbic Acid (VITAMIN C) 1000 MG tablet Take 1,000 mg by mouth daily.    . Calcium Carb-Cholecalciferol [phone] MG-UNIT TABS Take by mouth.    . cholecalciferol (VITAMIN D) 1000 units tablet Take 1,000 Units by mouth daily.    . Fluticasone-Salmeterol (ADVAIR) 250-50 MCG/DOSE AEPB Inhale 1 puff into the lungs 2 (two) times daily.    . furosemide (LASIX) 20 MG tablet Take 1 tablet (20 mg total) by mouth daily as needed. 30 tablet 0  . hydrochlorothiazide (HYDRODIURIL) 25 MG tablet Take 1 tablet (25 mg total) by mouth daily. 90 tablet 3  . latanoprost (XALATAN) 0.005 % ophthalmic solution 1 drop at bedtime.    Marland Kitchen levothyroxine (SYNTHROID, LEVOTHROID) 75 MCG tablet Take 1 tablet (75 mcg total) by mouth daily before breakfast. 90 tablet 3  . loratadine (CLARITIN) 10 MG tablet Take 10 mg by mouth daily.    Marland Kitchen losartan (COZAAR) 25 MG tablet Take 1 tablet (25 mg total) by mouth daily. 90 tablet 3  . MAGNESIUM-ZINC PO Take 351 mg by mouth at bedtime as needed.    . Melatonin 5 MG TABS Take by mouth.    . metoprolol tartrate (LOPRESSOR) 25 MG tablet Take 1 tablet (25 mg total) by mouth  2 (two) times daily. 180 tablet 3  . multivitamin-lutein (OCUVITE-LUTEIN) CAPS capsule Take 1 capsule by mouth daily.    . Rivaroxaban (XARELTO) 15 MG TABS tablet Take 15 mg by mouth 2 (two) times daily with a meal.     No current facility-administered medications for this visit.     Allergies  Allergen Reactions  . Tramadol Shortness Of Breath  . Meloxicam Rash    Indication: Patient presents with symptomatic varicose veins of the right lower extremity.  Procedure: Foam sclerotherapy was performed on the right lower extremity. Using ultrasound guidance, 5 mL of foam Sotradecol was used to inject the varicosities of the right lower extremity. Compression wraps were placed. The patient tolerated the procedure well.

## 2018-10-11 ENCOUNTER — Telehealth: Payer: Self-pay

## 2018-10-11 DIAGNOSIS — I482 Chronic atrial fibrillation, unspecified: Secondary | ICD-10-CM | POA: Diagnosis not present

## 2018-10-11 DIAGNOSIS — I10 Essential (primary) hypertension: Secondary | ICD-10-CM | POA: Diagnosis not present

## 2018-10-11 NOTE — Telephone Encounter (Signed)
Patient Ariana Guzman that she needs Amoxicillin refill. She states she was at cardiology and they feel swollen lymph nodes under ears. She also states she has had an infection for 2 weeks. I advised by VM that she needs to be seen so we can determine need for Abx as viruses can cause this also. Instructed to call and schedule or go to [place].

## 2018-10-17 DIAGNOSIS — I482 Chronic atrial fibrillation, unspecified: Secondary | ICD-10-CM | POA: Diagnosis not present

## 2018-10-20 DIAGNOSIS — H40003 Preglaucoma, unspecified, bilateral: Secondary | ICD-10-CM | POA: Diagnosis not present

## 2018-10-20 DIAGNOSIS — H2513 Age-related nuclear cataract, bilateral: Secondary | ICD-10-CM | POA: Diagnosis not present

## 2018-10-25 DIAGNOSIS — I10 Essential (primary) hypertension: Secondary | ICD-10-CM | POA: Diagnosis not present

## 2018-10-25 DIAGNOSIS — I482 Chronic atrial fibrillation, unspecified: Secondary | ICD-10-CM | POA: Diagnosis not present

## 2018-10-31 ENCOUNTER — Ambulatory Visit (INDEPENDENT_AMBULATORY_CARE_PROVIDER_SITE_OTHER): Payer: Medicare Other | Admitting: Vascular Surgery

## 2018-11-02 ENCOUNTER — Ambulatory Visit (INDEPENDENT_AMBULATORY_CARE_PROVIDER_SITE_OTHER): Payer: Medicare Other | Admitting: Internal Medicine

## 2018-11-02 ENCOUNTER — Other Ambulatory Visit: Payer: Self-pay

## 2018-11-02 ENCOUNTER — Encounter: Payer: Self-pay | Admitting: Internal Medicine

## 2018-11-02 VITALS — BP 118/78 | HR 68 | Temp 98.2°F | Resp 16 | Ht 67.0 in | Wt 201.0 lb

## 2018-11-02 DIAGNOSIS — J45998 Other asthma: Secondary | ICD-10-CM

## 2018-11-02 DIAGNOSIS — J0110 Acute frontal sinusitis, unspecified: Secondary | ICD-10-CM | POA: Diagnosis not present

## 2018-11-02 MED ORDER — DOXYCYCLINE HYCLATE 100 MG PO TABS: 100.0000 mg | ORAL_TABLET | Freq: Two times a day (BID) | ORAL | 0 refills | Status: AC

## 2018-11-02 NOTE — Patient Instructions (Signed)
Continue to use Mucinex twice a day (plain mucinex only)  Continue plenty of fluids

## 2018-11-02 NOTE — Progress Notes (Signed)
Date:  11/02/2018   Name:  Ariana Guzman   DOB:  04-Aug-1941   MRN:  [id number]   Chief Complaint: Sore Throat (1 week of soreness ) and Ear Pain (L ear pain x 1 week )  Sore Throat   This is a new problem. The current episode started in the past 7 days. The problem has been unchanged. There has been no fever. Associated symptoms include coughing and ear pain. Pertinent negatives include no abdominal pain, diarrhea, headaches or shortness of breath.  Otalgia   There is pain in the left ear. This is a new problem. The current episode started in the past 7 days. Associated symptoms include coughing and a sore throat. Pertinent negatives include no abdominal pain, diarrhea or headaches.  Asthma  She complains of cough and wheezing. There is no shortness of breath. Associated symptoms include ear pain, postnasal drip and a sore throat. Pertinent negatives include no chest pain, fever or headaches. Her past medical history is significant for asthma.    Review of Systems  Constitutional: Negative for chills, fatigue, fever and unexpected weight change.  HENT: Positive for ear pain, postnasal drip, sinus pressure, sinus pain and sore throat.   Respiratory: Positive for cough and wheezing. Negative for chest tightness and shortness of breath.   Cardiovascular: Negative for chest pain, palpitations and leg swelling.  Gastrointestinal: Negative for abdominal pain, constipation, diarrhea and nausea.  Neurological: Negative for dizziness and headaches.    Patient Active Problem List   Diagnosis Date Noted  . Pre-diabetes 05/29/2018  . Chronic insomnia 09/15/2017  . Essential hypertension 09/09/2017  . 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 Bilateral lower extremity edema 09/09/2017  . Pain in both lower extremities 09/09/2017  . Current use of long term anticoagulation 07/28/2017  . 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 Otitis externa,  chronic 12/03/2015  . Obstructive sleep apnea 10/07/2015  . Asthma, persistent controlled 09/25/2015  . Acquired hypothyroidism 02/29/2012  . Chronic atrial fibrillation 02/29/2012  . Hyperlipidemia 02/29/2012    Allergies  Allergen Reactions  . Tramadol Shortness Of Breath  . Meloxicam Rash    Past Surgical History:  Procedure Laterality Date  . ABDOMINAL HYSTERECTOMY  1991   uterus only- still has cervix - done for prolapse  . COLONOSCOPY  2009   normal  . TOE SURGERY      Social History   Tobacco Use  . Smoking status: Former Smoker    Packs/day: 0.50    Years: 29.00    Pack years: 14.50    Types: Cigarettes    Last attempt to quit: 09/28/1989    Years since quitting: 29.1  . Smokeless tobacco: Never Used  . Tobacco comment: smoking cessation materials not required  Substance Use Topics  . Alcohol use: No    Frequency: Never  . Drug use: No     Medication list has been reviewed and updated.  Current Meds  Medication Sig  . ALPRAZolam (XANAX) 0.25 MG tablet Take 1 tablet (0.25 mg total) by mouth 2 (two) times daily as needed for anxiety.  . Ascorbic Acid (VITAMIN C) 1000 MG tablet Take 1,000 mg by mouth daily.  . Calcium Carb-Cholecalciferol [phone] MG-UNIT TABS Take by mouth.  . cholecalciferol (VITAMIN D) 1000 units tablet Take 1,000 Units by mouth daily.  . Fluticasone-Salmeterol (ADVAIR) 250-50 MCG/DOSE AEPB Inhale 1 puff into the lungs 2 (two) times daily.  . furosemide (LASIX) 20  MG tablet Take 1 tablet (20 mg total) by mouth daily as needed.  . hydrochlorothiazide (HYDRODIURIL) 25 MG tablet Take 1 tablet (25 mg total) by mouth daily.  Marland Kitchen latanoprost (XALATAN) 0.005 % ophthalmic solution 1 drop at bedtime.  Marland Kitchen levothyroxine (SYNTHROID, LEVOTHROID) 75 MCG tablet Take 1 tablet (75 mcg total) by mouth daily before breakfast.  . loratadine (CLARITIN) 10 MG tablet Take 10 mg by mouth daily.  Marland Kitchen losartan (COZAAR) 25 MG tablet Take 1 tablet (25 mg total) by mouth  daily. (Patient taking differently: Take 50 mg by mouth daily. )  . MAGNESIUM-ZINC PO Take 351 mg by mouth at bedtime as needed.  . Melatonin 5 MG TABS Take by mouth.  . metoprolol tartrate (LOPRESSOR) 25 MG tablet Take 1 tablet (25 mg total) by mouth 2 (two) times daily.  . multivitamin-lutein (OCUVITE-LUTEIN) CAPS capsule Take 1 capsule by mouth daily.  . Rivaroxaban (XARELTO) 15 MG TABS tablet Take 15 mg by mouth 2 (two) times daily with a meal.    PHQ 2/9 Scores 09/04/2018 04/24/2018 11/22/2017  PHQ - 2 Score 0 0 0  PHQ- 9 Score - 0 -    Physical Exam Constitutional:      General: She is not in acute distress.    Appearance: She is well-developed. She is not ill-appearing.  HENT:     Right Ear: Ear canal and external ear normal. Tympanic membrane is not erythematous or retracted.     Left Ear: Ear canal and external ear normal. Tympanic membrane is erythematous and retracted.     Nose:     Right Sinus: Frontal sinus tenderness present. No maxillary sinus tenderness.     Left Sinus: Frontal sinus tenderness present. No maxillary sinus tenderness.     Mouth/Throat:     Mouth: No oral lesions.     Pharynx: Uvula midline. Posterior oropharyngeal erythema present. No oropharyngeal exudate or uvula swelling.  Neck:     Musculoskeletal: Normal range of motion and neck supple.  Cardiovascular:     Rate and Rhythm: Normal rate and regular rhythm.     Heart sounds: Normal heart sounds.  Pulmonary:     Breath sounds: Examination of the right-upper field reveals wheezing. Examination of the left-upper field reveals wheezing. Wheezing present. No rhonchi or rales.  Lymphadenopathy:     Cervical: No cervical adenopathy.  Neurological:     Mental Status: She is alert and oriented to person, place, and time.     Wt Readings from Last 3 Encounters:  11/02/18 201 lb (91.2 kg)  10/03/18 200 lb (90.7 kg)  09/25/18 202 lb (91.6 kg)    BP 118/78   Pulse 68   Temp 98.2 F (36.8 C) (Oral)    Resp 16   Ht 5\' 7"  (1.702 m)   Wt 201 lb (91.2 kg)   SpO2 96%   BMI 31.48 kg/m   Assessment and Plan: 1. Acute non-recurrent frontal sinusitis Continue mucinex for cough - doxycycline (VIBRA-TABS) 100 MG tablet; Take 1 tablet (100 mg total) by mouth 2 (two) times daily for 10 days.  Dispense: 20 tablet; Refill: 0  2. Asthma, persistent controlled Continue Advair twice a day and Proventil PRN   Partially dictated using Editor, commissioning. Any errors are unintentional.  Halina Maidens, MD [facility]  11/02/2018

## 2018-11-09 ENCOUNTER — Other Ambulatory Visit: Payer: Self-pay

## 2018-11-09 ENCOUNTER — Encounter: Payer: Self-pay | Admitting: Internal Medicine

## 2018-11-09 ENCOUNTER — Ambulatory Visit (INDEPENDENT_AMBULATORY_CARE_PROVIDER_SITE_OTHER): Payer: Medicare Other | Admitting: Internal Medicine

## 2018-11-09 VITALS — BP 122/74 | HR 69 | Ht 67.0 in | Wt 203.0 lb

## 2018-11-09 DIAGNOSIS — F5101 Primary insomnia: Secondary | ICD-10-CM

## 2018-11-09 DIAGNOSIS — I482 Chronic atrial fibrillation, unspecified: Secondary | ICD-10-CM

## 2018-11-09 DIAGNOSIS — I10 Essential (primary) hypertension: Secondary | ICD-10-CM

## 2018-11-09 DIAGNOSIS — H6063 Unspecified chronic otitis externa, bilateral: Secondary | ICD-10-CM

## 2018-11-09 MED ORDER — NEOMYCIN-POLYMYXIN-HC 3.5-10000-1 OT SUSP: 3.0000 [drp] | Freq: Three times a day (TID) | OTIC | 0 refills | Status: DC | PRN

## 2018-11-09 MED ORDER — ALPRAZOLAM 0.25 MG PO TABS
0.2500 mg | ORAL_TABLET | Freq: Two times a day (BID) | ORAL | 5 refills | Status: DC | PRN
Start: 2018-11-09 — End: 2019-05-08

## 2018-11-09 NOTE — Progress Notes (Signed)
Date:  11/09/2018   Name:  Ariana Guzman   DOB:  1941-03-12   MRN:  [id number]   Chief Complaint: Hypertension  Hypertension  This is a chronic problem. The problem is controlled. Pertinent negatives include no blurred vision, chest pain, headaches or shortness of breath. Past treatments include diuretics, angiotensin blockers and beta blockers. The current treatment provides significant improvement.  Diabetes  She presents for her follow-up diabetic visit. Diabetes type: prediabetes. Her disease course has been stable. Pertinent negatives for hypoglycemia include no headaches. Pertinent negatives for diabetes include no blurred vision, no chest pain, no weakness and no weight loss. Symptoms are stable. An ACE inhibitor/angiotensin II receptor blocker is being taken.  Insomnia  The problem occurs every several days. The problem is unchanged. Past treatments include medication (low dose xanax).  Sinus Problem  This is a recurrent problem. The problem has been gradually improving since onset. There has been no fever. The pain is mild. Associated symptoms include ear pain and sinus pressure. Pertinent negatives include no chills, coughing, headaches, shortness of breath, sore throat or swollen glands. (Sinus pressure is better, using flonase but very dry mouth in the morning.  Has a few days of Doxycycline left.  Ear is still intermittently painful on left.) Past treatments include antibiotics. The treatment provided moderate relief.  Atrial Fibrillation - she denies rapid heart beat, lightheadedness, dizziness and chest pain.  She continues on Xarelto with no bleeding.  Lab Results  Component Value Date   HGBA1C 6.4 (H) 09/04/2018   Lab Results  Component Value Date   CREATININE 0.75 09/04/2018   BUN 13 09/04/2018   NA 133 (L) 09/04/2018   K 4.0 09/04/2018   CL 88 (L) 09/04/2018   CO2 27 09/04/2018   Lab Results  Component Value Date   CHOL 185 05/25/2018   HDL 53 05/25/2018   LDLCALC 105 (H) 05/25/2018   TRIG 133 05/25/2018   CHOLHDL 3.5 05/25/2018   Lab Results  Component Value Date   TSH 1.880 09/04/2018     Review of Systems  Constitutional: Negative for chills and weight loss.  HENT: Positive for ear pain, mouth sores (dry mouth every am) and sinus pressure. Negative for sore throat and trouble swallowing.   Eyes: Negative for blurred vision and visual disturbance.  Respiratory: Negative for cough, chest tightness, shortness of breath and wheezing.   Cardiovascular: Negative for chest pain.  Neurological: Negative for weakness and headaches.  Psychiatric/Behavioral: The patient has insomnia.     Patient Active Problem List   Diagnosis Date Noted  . Pre-diabetes 05/29/2018  . Chronic insomnia 09/15/2017  . Essential hypertension 09/09/2017  . 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 Bilateral lower extremity edema 09/09/2017  . Pain in both lower extremities 09/09/2017  . Current use of long term anticoagulation 07/28/2017  . 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 Otitis externa, chronic 12/03/2015  . Obstructive sleep apnea 10/07/2015  . Asthma, persistent controlled 09/25/2015  . Acquired hypothyroidism 02/29/2012  . Chronic atrial fibrillation 02/29/2012  . Hyperlipidemia 02/29/2012    Allergies  Allergen Reactions  . Tramadol Shortness Of Breath  . Meloxicam Rash    Past Surgical History:  Procedure Laterality Date  . ABDOMINAL HYSTERECTOMY  1991   uterus only- still has cervix - done for prolapse  . COLONOSCOPY  2009   normal  . TOE SURGERY      Social History   Tobacco  Use  . Smoking status: Former Smoker    Packs/day: 0.50    Years: 29.00    Pack years: 14.50    Types: Cigarettes    Last attempt to quit: 09/28/1989    Years since quitting: 29.1  . Smokeless tobacco: Never Used  . Tobacco comment: smoking cessation materials not required  Substance Use Topics  .  Alcohol use: No    Frequency: Never  . Drug use: No     Medication list has been reviewed and updated.  Current Meds  Medication Sig  . albuterol (PROVENTIL) (2.5 MG/3ML) 0.083% nebulizer solution Inhale into the lungs.  . ALPRAZolam (XANAX) 0.25 MG tablet Take 1 tablet (0.25 mg total) by mouth 2 (two) times daily as needed for anxiety. (Patient taking differently: Take 0.25 mg by mouth 2 (two) times daily. )  . Ascorbic Acid (VITAMIN C) 1000 MG tablet Take 1,000 mg by mouth daily.  . Calcium Carb-Cholecalciferol [phone] MG-UNIT TABS Take by mouth.  . cholecalciferol (VITAMIN D) 1000 units tablet Take 1,000 Units by mouth daily.  Marland Kitchen doxycycline (VIBRA-TABS) 100 MG tablet Take 1 tablet (100 mg total) by mouth 2 (two) times daily for 10 days. (Patient taking differently: Take 100 mg by mouth 2 (two) times daily. SINUS Inf.)  . Fluticasone-Salmeterol (ADVAIR) 250-50 MCG/DOSE AEPB Inhale 1 puff into the lungs 2 (two) times daily.  . furosemide (LASIX) 20 MG tablet Take 1 tablet (20 mg total) by mouth daily as needed.  . hydrochlorothiazide (HYDRODIURIL) 25 MG tablet Take 1 tablet (25 mg total) by mouth daily.  Marland Kitchen latanoprost (XALATAN) 0.005 % ophthalmic solution 1 drop at bedtime.  Marland Kitchen levothyroxine (SYNTHROID, LEVOTHROID) 75 MCG tablet Take 1 tablet (75 mcg total) by mouth daily before breakfast.  . loratadine (CLARITIN) 10 MG tablet Take 10 mg by mouth daily.  Marland Kitchen losartan (COZAAR) 25 MG tablet Take 1 tablet (25 mg total) by mouth daily. (Patient taking differently: Take 50 mg by mouth daily. )  . MAGNESIUM-ZINC PO Take 351 mg by mouth at bedtime as needed.  . Melatonin 5 MG TABS Take by mouth.  . metoprolol tartrate (LOPRESSOR) 25 MG tablet Take 1 tablet (25 mg total) by mouth 2 (two) times daily.  . multivitamin-lutein (OCUVITE-LUTEIN) CAPS capsule Take 1 capsule by mouth daily.  . Rivaroxaban (XARELTO) 15 MG TABS tablet Take 15 mg by mouth daily with supper.     PHQ 2/9 Scores 11/09/2018  09/04/2018 04/24/2018 11/22/2017  PHQ - 2 Score 0 0 0 0  PHQ- 9 Score - - 0 -    Physical Exam Vitals signs and nursing note reviewed.  Constitutional:      General: She is not in acute distress.    Appearance: She is well-developed.  HENT:     Head: Normocephalic and atraumatic.     Right Ear: Tympanic membrane and ear canal normal. Tympanic membrane is not erythematous.     Left Ear: Ear canal normal. Tympanic membrane is scarred. Tympanic membrane is not erythematous.     Nose:     Right Sinus: Frontal sinus tenderness present.     Left Sinus: Frontal sinus tenderness present.     Mouth/Throat:     Pharynx: No oropharyngeal exudate, posterior oropharyngeal erythema or uvula swelling.  Cardiovascular:     Rate and Rhythm: Normal rate. Rhythm regularly irregular.     Pulses: Normal pulses.     Heart sounds: Heart sounds are distant. No murmur.     Comments: TED hose  on both LEs Pulmonary:     Effort: Pulmonary effort is normal. No respiratory distress.     Breath sounds: Normal breath sounds and air entry. No decreased breath sounds, wheezing or rhonchi.  Musculoskeletal: Normal range of motion.     Right lower leg: No edema.     Left lower leg: No edema.  Skin:    General: Skin is warm and dry.     Findings: No rash.  Neurological:     Mental Status: She is alert and oriented to person, place, and time.  Psychiatric:        Behavior: Behavior normal.        Thought Content: Thought content normal.     Wt Readings from Last 3 Encounters:  11/09/18 203 lb (92.1 kg)  11/02/18 201 lb (91.2 kg)  10/03/18 200 lb (90.7 kg)    BP 122/74   Pulse 69   Ht 5\' 7"  (1.702 m)   Wt 203 lb (92.1 kg)   SpO2 98%   BMI 31.79 kg/m   Assessment and Plan: 1. Essential hypertension controlled  2. Chronic atrial fibrillation Rate controlled, no issues with DOAC therapy  3. 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  Dispense: 10 mL; Refill: 0  4. Primary insomnia - ALPRAZolam (XANAX) 0.25 MG tablet; Take 1 tablet (0.25 mg total) by mouth 2 (two) times daily as needed for anxiety.  Dispense: 30 tablet; Refill: 5   Partially dictated using Editor, commissioning. Any errors are unintentional.  Halina Maidens, MD [facility]  11/09/2018

## 2018-11-16 ENCOUNTER — Other Ambulatory Visit: Payer: Self-pay | Admitting: Internal Medicine

## 2018-11-16 DIAGNOSIS — J0110 Acute frontal sinusitis, unspecified: Secondary | ICD-10-CM

## 2018-11-16 MED ORDER — AMOXICILLIN 875 MG PO TABS: 875.0000 mg | ORAL_TABLET | Freq: Two times a day (BID) | ORAL | 0 refills | Status: AC

## 2018-11-27 ENCOUNTER — Ambulatory Visit: Payer: Medicare Other | Admitting: Internal Medicine

## 2018-12-07 ENCOUNTER — Other Ambulatory Visit: Payer: Self-pay

## 2018-12-07 NOTE — Patient Outreach (Signed)
[facility]) Care Management  12/07/2018  Ariana Guzman 1941/08/08 [id number]  Medication Adherence call to [name] [facility]iant Voice message left with a call back number. [name] is showing past due on Losartan 50 mg under [name].    [facility] Direct Dial [phone]  Fax [phone] [name]

## 2018-12-28 ENCOUNTER — Telehealth: Payer: Self-pay

## 2018-12-28 ENCOUNTER — Other Ambulatory Visit: Payer: Self-pay

## 2018-12-28 DIAGNOSIS — H6063 Unspecified chronic otitis externa, bilateral: Secondary | ICD-10-CM

## 2018-12-28 MED ORDER — NEOMYCIN-POLYMYXIN-HC 3.5-10000-1 OT SUSP: 3.0000 [drp] | Freq: Three times a day (TID) | OTIC | 0 refills | Status: DC | PRN

## 2018-12-28 NOTE — Telephone Encounter (Signed)
Patient called saying she is having both hears hurting and congestion in her head with a dry cough. No fever. Said she has been trying claritin for years and bought some allegra but has not tried it yet. We have refilled ear drops for her in the past and wants to know if she can get more ear drops.   Told her it does sound like possible allergies. Change Claritin to allegra and I sent in her ear drops. Told her to keep taking Flonase. If it doesn't get better I told her to call to be seen for an appt.

## 2019-01-03 ENCOUNTER — Ambulatory Visit (INDEPENDENT_AMBULATORY_CARE_PROVIDER_SITE_OTHER): Payer: Medicare Other | Admitting: Internal Medicine

## 2019-01-03 ENCOUNTER — Other Ambulatory Visit: Payer: Self-pay

## 2019-01-03 ENCOUNTER — Encounter: Payer: Self-pay | Admitting: Internal Medicine

## 2019-01-03 VITALS — Ht 67.0 in | Wt 203.0 lb

## 2019-01-03 DIAGNOSIS — J0100 Acute maxillary sinusitis, unspecified: Secondary | ICD-10-CM

## 2019-01-03 MED ORDER — PREDNISONE 10 MG PO TABS: 10.0000 mg | ORAL_TABLET | ORAL | 0 refills | Status: AC

## 2019-01-03 MED ORDER — CLINDAMYCIN HCL 300 MG PO CAPS: 300.0000 mg | ORAL_CAPSULE | Freq: Three times a day (TID) | ORAL | 0 refills | Status: AC

## 2019-01-03 NOTE — Progress Notes (Signed)
Date:  01/03/2019   Name:  Ariana Guzman   DOB:  [birth year]   MRN:  [id number]  I connected with this patient, Ariana Guzman, by telephone at the patient's home.  I verified that I am speaking with the correct person using two identifiers. This visit was conducted via telephone due to the Covid-19 outbreak from my office at [facility] in [place], [place]. I discussed the limitations, risks, security and privacy concerns of performing an evaluation and management service by telephone. I also discussed with the patient that there may be a patient responsible charge related to this service. The patient expressed understanding and agreed to proceed.  Chief Complaint: Tinnitus (Ringing in ears on and off. No issue now but concerned. Left. ) and Sinus Problem (Sinus' are full. Headache above eyes, and drainage in throat. Cough- thick mucous- slight yellow colored. Says this happens everytime this year. Had to get prednisone and amoxicillin in the past.  )  Sinus Problem  This is a recurrent problem. There has been no fever. The pain is mild. Associated symptoms include congestion, coughing, headaches (frontal), shortness of breath and sinus pressure. Pertinent negatives include no chills, ear pain, sneezing, sore throat or swollen glands. Treatments tried: allegra and flonase. The treatment provided mild relief.   Tinnitus - started having loud noises in her left ear several months ago, off and on.  It kept her awake all night last night but now is gone. She had a full ENT evaluation 3 years ago which showed mild hearing loss and chronic sinusitis.   Review of Systems  Constitutional: Negative for chills, fatigue and fever.  HENT: Positive for congestion, hearing loss, sinus pressure and tinnitus. Negative for ear pain, sneezing, sore throat and trouble swallowing.   Eyes: Negative for redness and itching.  Respiratory: Positive for cough and shortness of breath. Negative for chest tightness and  wheezing.   Cardiovascular: Negative for chest pain and palpitations.  Neurological: Positive for headaches (frontal). Negative for dizziness.  Hematological: Negative for adenopathy.  Psychiatric/Behavioral: Negative for sleep disturbance.    Patient Active Problem List   Diagnosis Date Noted  . Pre-diabetes 05/29/2018  . Primary insomnia 09/15/2017  . Essential hypertension 09/09/2017  . 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 Bilateral lower extremity edema 09/09/2017  . Pain in both lower extremities 09/09/2017  . Current use of long term anticoagulation 07/28/2017  . 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 Otitis externa, chronic 12/03/2015  . Obstructive sleep apnea 10/07/2015  . Asthma, persistent controlled 09/25/2015  . Acquired hypothyroidism 02/29/2012  . Chronic atrial fibrillation 02/29/2012  . Hyperlipidemia 02/29/2012    Allergies  Allergen Reactions  . Tramadol Shortness Of Breath  . Meloxicam Rash    Past Surgical History:  Procedure Laterality Date  . ABDOMINAL HYSTERECTOMY  1991   uterus only- still has cervix - done for prolapse  . COLONOSCOPY  2009   normal  . TOE SURGERY      Social History   Tobacco Use  . Smoking status: Former Smoker    Packs/day: 0.50    Years: 29.00    Pack years: 14.50    Types: Cigarettes    Last attempt to quit: 09/28/1989    Years since quitting: 29.2  . Smokeless tobacco: Never Used  . Tobacco comment: smoking cessation materials not required  Substance Use Topics  . Alcohol use: No    Frequency: Never  .  Drug use: No     Medication list has been reviewed and updated.  Current Meds  Medication Sig  . albuterol (PROVENTIL) (2.5 MG/3ML) 0.083% nebulizer solution Inhale into the lungs.  . ALPRAZolam (XANAX) 0.25 MG tablet Take 1 tablet (0.25 mg total) by mouth 2 (two) times daily as needed for anxiety.  . Ascorbic Acid (VITAMIN C) 1000 MG tablet Take 1,000  mg by mouth daily.  . Calcium Carb-Cholecalciferol [phone] MG-UNIT TABS Take by mouth.  . cholecalciferol (VITAMIN D) 1000 units tablet Take 1,000 Units by mouth daily.  . fexofenadine (ALLEGRA) 180 MG tablet Take 180 mg by mouth daily.  . Fluticasone-Salmeterol (ADVAIR) 250-50 MCG/DOSE AEPB Inhale 1 puff into the lungs 2 (two) times daily.  . furosemide (LASIX) 20 MG tablet Take 1 tablet (20 mg total) by mouth daily as needed.  . hydrochlorothiazide (HYDRODIURIL) 25 MG tablet Take 1 tablet (25 mg total) by mouth daily.  Marland Kitchen latanoprost (XALATAN) 0.005 % ophthalmic solution 1 drop at bedtime.  Marland Kitchen levothyroxine (SYNTHROID, LEVOTHROID) 75 MCG tablet Take 1 tablet (75 mcg total) by mouth daily before breakfast.  . losartan (COZAAR) 25 MG tablet Take 1 tablet (25 mg total) by mouth daily. (Patient taking differently: Take 50 mg by mouth daily. )  . MAGNESIUM-ZINC PO Take 351 mg by mouth at bedtime as needed.  . Melatonin 5 MG TABS Take by mouth.  . metoprolol tartrate (LOPRESSOR) 25 MG tablet Take 1 tablet (25 mg total) by mouth 2 (two) times daily.  . multivitamin-lutein (OCUVITE-LUTEIN) CAPS capsule Take 1 capsule by mouth daily.  Marland Kitchen neomycin-polymyxin-hydrocortisone (CORTISPORIN) 3.5-10000-1 OTIC suspension Place 3 drops into both ears 3 (three) times daily as needed.  . Rivaroxaban (XARELTO) 15 MG TABS tablet Take 15 mg by mouth daily with supper.     PHQ 2/9 Scores 01/03/2019 11/09/2018 09/04/2018 04/24/2018  PHQ - 2 Score 0 0 0 0  PHQ- 9 Score - - - 0    BP Readings from Last 3 Encounters:  11/09/18 122/74  11/02/18 118/78  10/03/18 (!) 143/82    Physical Exam Pulmonary:     Effort: Pulmonary effort is normal.     Comments: No  dyspnea noted during the call.  Occasional loose cough noted.  Voice normal. Neurological:     Mental Status: She is alert and oriented to person, place, and time.  Psychiatric:        Attention and Perception: Attention normal.        Mood and Affect: Mood  normal.        Speech: Speech normal.        Cognition and Memory: Cognition normal.     Wt Readings from Last 3 Encounters:  01/03/19 203 lb (92.1 kg)  11/09/18 203 lb (92.1 kg)  11/02/18 201 lb (91.2 kg)    Ht 5\' 7"  (1.702 m)   Wt 203 lb (92.1 kg)   BMI 31.79 kg/m   Assessment and Plan: 1. Acute non-recurrent maxillary sinusitis Continue Allegra and Flonase Consider ENT referral if tinnitus or sinus infection occurs - clindamycin (CLEOCIN) 300 MG capsule; Take 1 capsule (300 mg total) by mouth 3 (three) times daily for 10 days.  Dispense: 30 capsule; Refill: 0 - predniSONE (DELTASONE) 10 MG tablet; Take 1 tablet (10 mg total) by mouth as directed for 6 days. Take 6,5,4,3,2,1 then stop  Dispense: 21 tablet; Refill: 0  I spent 14 minutes on this encounter. Partially dictated using Editor, commissioning. Any errors are unintentional.  Mickel Baas  Army Melia, [facility]  01/03/2019

## 2019-01-29 ENCOUNTER — Telehealth: Payer: Self-pay | Admitting: Internal Medicine

## 2019-01-29 DIAGNOSIS — R51 Headache: Secondary | ICD-10-CM | POA: Diagnosis not present

## 2019-01-29 DIAGNOSIS — J029 Acute pharyngitis, unspecified: Secondary | ICD-10-CM | POA: Diagnosis not present

## 2019-01-29 DIAGNOSIS — H6592 Unspecified nonsuppurative otitis media, left ear: Secondary | ICD-10-CM | POA: Diagnosis not present

## 2019-01-29 DIAGNOSIS — J4520 Mild intermittent asthma, uncomplicated: Secondary | ICD-10-CM | POA: Diagnosis not present

## 2019-01-29 DIAGNOSIS — H9202 Otalgia, left ear: Secondary | ICD-10-CM | POA: Diagnosis not present

## 2019-01-29 NOTE — Telephone Encounter (Signed)
Son called to say his mom is having scratchy throat, and can not talk. [phone]).

## 2019-01-29 NOTE — Telephone Encounter (Signed)
Called and left VM informing if she cannot talk she needs to go to Urgent Care. If she can, we need to do a telephone visit this afternoon to decide treatment. She needs to try and take her tempature, blood pressure and pulse if she can.  Thank you.   FYI just in case they call back.

## 2019-02-02 DIAGNOSIS — G4733 Obstructive sleep apnea (adult) (pediatric): Secondary | ICD-10-CM | POA: Diagnosis not present

## 2019-02-13 ENCOUNTER — Other Ambulatory Visit: Payer: Self-pay | Admitting: Internal Medicine

## 2019-02-13 ENCOUNTER — Telehealth: Payer: Self-pay

## 2019-02-13 DIAGNOSIS — H6690 Otitis media, unspecified, unspecified ear: Secondary | ICD-10-CM

## 2019-02-13 MED ORDER — AMOXICILLIN-POT CLAVULANATE 875-125 MG PO TABS: 1.0000 | ORAL_TABLET | Freq: Two times a day (BID) | ORAL | 0 refills | Status: AC

## 2019-02-13 NOTE — Telephone Encounter (Signed)
Pt called saying she called our office 2-3 weeks ago. She was having sick sxs so she could not be seen in the office. They tried to set her up for telephone but her son said she could not talk on the phone. She went to [facility] in [place]. ( see care everywhere)  Patient said she was given amoxicillin for 10 days for sinus infection, and ear infection behind the ear drum.   Wants to know if we can give her one mor RF on abx to completely get rid of infection. She is better but not all the way and symptoms are still there for on and off ear pain, sore throat, and some pressure in face. Cough with clear mucous.   Please advise.

## 2019-02-13 NOTE — Telephone Encounter (Signed)
I sent a prescription for a stronger antibiotics.  If the other was going to work, 10 days would be enough.

## 2019-02-14 NOTE — Telephone Encounter (Signed)
Called and spoke with patient. Informed of abx sent and told to take all.

## 2019-03-19 ENCOUNTER — Other Ambulatory Visit: Payer: Self-pay

## 2019-03-19 MED ORDER — BENZONATATE 100 MG PO CAPS: 100.0000 mg | ORAL_CAPSULE | Freq: Two times a day (BID) | ORAL | 0 refills | Status: DC | PRN

## 2019-03-19 NOTE — Progress Notes (Signed)
Cough X 2 weeks. No fever. Just nagging cough with running nose. No body aches, Sob or loss of taste or smell. Patient said she odes not leave her house and has not since March when the virus started. Said she has taking Claritin and Flonase with no relief. Sent in [place] twice daily of 10 days for patient while Dr. Army Melia is out on vacation.

## 2019-03-26 ENCOUNTER — Telehealth: Payer: Self-pay | Admitting: Internal Medicine

## 2019-03-26 NOTE — Telephone Encounter (Signed)
Pt on schedule for tomorrow for chronic cough.  She has a pulmonologist and needs to call him for evaluation.  No need to come here.

## 2019-03-26 NOTE — Telephone Encounter (Signed)
Advised to call Pulmonology at [facility]

## 2019-03-27 ENCOUNTER — Ambulatory Visit: Payer: Medicare Other | Admitting: Internal Medicine

## 2019-03-28 ENCOUNTER — Encounter: Payer: Self-pay | Admitting: Emergency Medicine

## 2019-03-28 ENCOUNTER — Ambulatory Visit
Admission: EM | Admit: 2019-03-28 | Discharge: 2019-03-28 | Disposition: A | Discharge status: Home / Self Care | Payer: Medicare Other | Attending: Emergency Medicine | Admitting: Emergency Medicine

## 2019-03-28 ENCOUNTER — Other Ambulatory Visit: Payer: Self-pay

## 2019-03-28 ENCOUNTER — Ambulatory Visit (INDEPENDENT_AMBULATORY_CARE_PROVIDER_SITE_OTHER): Payer: Medicare Other

## 2019-03-28 DIAGNOSIS — R05 Cough: Secondary | ICD-10-CM

## 2019-03-28 DIAGNOSIS — J45901 Unspecified asthma with (acute) exacerbation: Secondary | ICD-10-CM

## 2019-03-28 DIAGNOSIS — R059 Cough, unspecified: Secondary | ICD-10-CM

## 2019-03-28 DIAGNOSIS — J0110 Acute frontal sinusitis, unspecified: Secondary | ICD-10-CM | POA: Diagnosis not present

## 2019-03-28 MED ORDER — DOXYCYCLINE HYCLATE 100 MG PO CAPS: 100.0000 mg | ORAL_CAPSULE | Freq: Two times a day (BID) | ORAL | 0 refills | Status: DC

## 2019-03-28 MED ORDER — BENZONATATE 100 MG PO CAPS: 100.0000 mg | ORAL_CAPSULE | Freq: Three times a day (TID) | ORAL | 0 refills | Status: DC | PRN

## 2019-03-28 MED ORDER — PREDNISONE 20 MG PO TABS: 40.0000 mg | ORAL_TABLET | Freq: Every day | ORAL | 0 refills | Status: DC

## 2019-03-28 NOTE — Discharge Instructions (Addendum)
Take medication as prescribed. Rest. Drink plenty of fluids. Monitor.  Use home albuterol inhaler as needed for wheezing.  Follow up with your primary care physician this week as needed. Return to Urgent care for new or worsening concerns.

## 2019-03-28 NOTE — ED Provider Notes (Signed)
[facility] ____________________________________________  Time seen: Approximately 5:07 PM  I have reviewed the triage vital signs and the nursing notes.   HISTORY  Chief Complaint Cough   HPI Ariana Guzman is a 78 y.o. female presenting for evaluation of cough and congestion complaints present for at least 3 weeks.  Patient reports she has intermittently noticed wheezing and feels like her asthma has flared up.  Has occasionally used her albuterol inhaler.  Has not been take any medications over-the-counter much for the same complaint.  States has been some postnasal drainage and sinus pressure.  States cough is productive of whitish mucus.  Denies chest pain or shortness of breath.  Has continued remain active.  Denies known sick contacts.  No accompanying fevers.  Denies extremity edema.  Denies other aggravating alleviating factors.  Glean Hess, MD: PCP   Past Medical History:  Diagnosis Date  . Asthma   . Atrial fibrillation ([place])   . Hypertension   . Peripheral arterial disease [facility])     Patient Active Problem List   Diagnosis Date Noted  . Pre-diabetes 05/29/2018  . Primary insomnia 09/15/2017  . Essential hypertension 09/09/2017  . 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 Bilateral lower extremity edema 09/09/2017  . Pain in both lower extremities 09/09/2017  . Current use of long term anticoagulation 07/28/2017  . 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 Otitis externa, chronic 12/03/2015  . Obstructive sleep apnea 10/07/2015  . Asthma, persistent controlled 09/25/2015  . Acquired hypothyroidism 02/29/2012  . Chronic atrial fibrillation 02/29/2012  . Hyperlipidemia 02/29/2012    Past Surgical History:  Procedure Laterality Date  . ABDOMINAL HYSTERECTOMY  1991   uterus only- still has cervix - done for prolapse  . COLONOSCOPY  2009   normal  . TOE SURGERY       No current  facility-administered medications for this encounter.   Current Outpatient Medications:  .  albuterol (PROVENTIL) (2.5 MG/3ML) 0.083% nebulizer solution, Inhale into the lungs., Disp: , Rfl:  .  ALPRAZolam (XANAX) 0.25 MG tablet, Take 1 tablet (0.25 mg total) by mouth 2 (two) times daily as needed for anxiety., Disp: 30 tablet, Rfl: 5 .  Ascorbic Acid (VITAMIN C) 1000 MG tablet, Take 1,000 mg by mouth daily., Disp: , Rfl:  .  Calcium Carb-Cholecalciferol [phone] MG-UNIT TABS, Take by mouth., Disp: , Rfl:  .  cholecalciferol (VITAMIN D) 1000 units tablet, Take 1,000 Units by mouth daily., Disp: , Rfl:  .  fluticasone (FLONASE) 50 MCG/ACT nasal spray, Place into both nostrils daily., Disp: , Rfl:  .  Fluticasone-Salmeterol (ADVAIR) 250-50 MCG/DOSE AEPB, Inhale 1 puff into the lungs 2 (two) times daily., Disp: , Rfl:  .  furosemide (LASIX) 20 MG tablet, Take 1 tablet (20 mg total) by mouth daily as needed., Disp: 30 tablet, Rfl: 0 .  hydrochlorothiazide (HYDRODIURIL) 25 MG tablet, Take 1 tablet (25 mg total) by mouth daily., Disp: 90 tablet, Rfl: 3 .  latanoprost (XALATAN) 0.005 % ophthalmic solution, 1 drop at bedtime., Disp: , Rfl:  .  levothyroxine (SYNTHROID, LEVOTHROID) 75 MCG tablet, Take 1 tablet (75 mcg total) by mouth daily before breakfast., Disp: 90 tablet, Rfl: 3 .  losartan (COZAAR) 25 MG tablet, Take 1 tablet (25 mg total) by mouth daily. (Patient taking differently: Take 50 mg by mouth daily. ), Disp: 90 tablet, Rfl: 3 .  MAGNESIUM-ZINC PO, Take 351 mg by mouth at bedtime as needed., Disp: ,  Rfl:  .  Melatonin 5 MG TABS, Take by mouth., Disp: , Rfl:  .  metoprolol tartrate (LOPRESSOR) 25 MG tablet, Take 1 tablet (25 mg total) by mouth 2 (two) times daily., Disp: 180 tablet, Rfl: 3 .  Rivaroxaban (XARELTO) 15 MG TABS tablet, Take 15 mg by mouth daily with supper. , Disp: , Rfl:  .  benzonatate (TESSALON PERLES) 100 MG capsule, Take 1 capsule (100 mg total) by mouth 3 (three) times  daily as needed for cough., Disp: 15 capsule, Rfl: 0 .  doxycycline (VIBRAMYCIN) 100 MG capsule, Take 1 capsule (100 mg total) by mouth 2 (two) times daily., Disp: 20 capsule, Rfl: 0 .  multivitamin-lutein (OCUVITE-LUTEIN) CAPS capsule, Take 1 capsule by mouth daily., Disp: , Rfl:  .  predniSONE (DELTASONE) 20 MG tablet, Take 2 tablets (40 mg total) by mouth daily., Disp: 10 tablet, Rfl: 0  Allergies Tramadol and Meloxicam  Family History  Problem Relation Age of Onset  . Breast cancer Other 56  . Dementia Mother   . Cancer Father        prostate and bone  . Heart attack Sister     Social History Social History   Tobacco Use  . Smoking status: Former Smoker    Packs/day: 0.50    Years: 29.00    Pack years: 14.50    Types: Cigarettes    Quit date: 09/28/1989    Years since quitting: 29.5  . Smokeless tobacco: Never Used  . Tobacco comment: smoking cessation materials not required  Substance Use Topics  . Alcohol use: No    Frequency: Never  . Drug use: No    Review of Systems Constitutional: No fever ENT: No sore throat. Cardiovascular: Denies chest pain. Respiratory: Denies shortness of breath. Gastrointestinal: No abdominal pain.   Genitourinary: Negative for dysuria. Musculoskeletal: Negative for back pain. Skin: Negative for rash. Neurological: Negative for headaches, focal weakness or numbness.   ____________________________________________   PHYSICAL EXAM:  VITAL SIGNS: ED Triage Vitals  Enc Vitals Group     BP 03/28/19 1558 (!) 166/80     Pulse Rate 03/28/19 1558 71     Resp 03/28/19 1558 18     Temp 03/28/19 1558 98.2 F (36.8 C)     Temp Source 03/28/19 1558 Oral     SpO2 03/28/19 1558 97 %     Weight 03/28/19 1552 192 lb (87.1 kg)     Height 03/28/19 1552 5\' 7"  (1.702 m)     Head Circumference --      Peak Flow --      Pain Score 03/28/19 1552 0     Pain Loc --      Pain Edu? --      Excl. in [place]? --     Constitutional: Alert and  oriented. Well appearing and in no acute distress. Eyes: Conjunctivae are normal.  Head: Atraumatic.Mild tenderness to palpation bilateral frontal and nontender maxillary sinuses. No swelling. No erythema.   Ears: no erythema, normal TMs bilaterally.   Nose: nasal congestion with bilateral nasal turbinate erythema and edema.   Mouth/Throat: Mucous membranes are moist.  Oropharynx non-erythematous.No tonsillar swelling or exudate.  Neck: No stridor.  No cervical spine tenderness to palpation. Hematological/Lymphatic/Immunilogical: No cervical lymphadenopathy. Cardiovascular: Normal rate, regular rhythm. Grossly normal heart sounds.  Good peripheral circulation. Respiratory: Normal respiratory effort.  No retractions.  No wheezes, rales or rhonchi. Musculoskeletal: Steady gait.  No extremity edema noted. Neurologic:  Normal speech and language. No  gross focal neurologic deficits are appreciated. No gait instability. Skin:  Skin is warm, dry and intact. No rash noted. Psychiatric: Mood and affect are normal. Speech and behavior are normal. ___________________________________________   LABS (all labs ordered are listed, but only abnormal results are displayed)  Labs Reviewed  NOVEL CORONAVIRUS, NAA (HOSPITAL ORDER, SEND-OUT TO REF LAB)    RADIOLOGY  Dg Chest 2 View  Result Date: 03/28/2019 CLINICAL DATA:  Cough for 3 weeks EXAM: CHEST - 2 VIEW COMPARISON:  11/29/2017 chest radiograph. FINDINGS: Stable cardiomediastinal silhouette with normal heart size. No pneumothorax. No pleural effusion. No pulmonary edema. No acute consolidative airspace disease. Stable minimal left basilar scarring. IMPRESSION: No active cardiopulmonary disease. Electronically Signed   By: Ilona Sorrel M.D.   On: 03/28/2019 16:39   ____________________________________________   PROCEDURES Procedures   INITIAL IMPRESSION / ASSESSMENT AND PLAN / ED COURSE  Pertinent labs & imaging results that were available  during my care of the patient were reviewed by me and considered in my medical decision making (see chart for details).  Overall well-appearing patient.  No acute distress.  Suspect viral illness with asthma exacerbation.  Chest x-ray as above, no active cardiopulmonary disease.  COVID-19 testing completed.  [place] DHHS information given instructed to follow.  Will treat with oral prednisone, oral doxycycline and PRN Tessalon Perles.  Follow-up with primary care.  Rest, fluids supportive care.Discussed indication, risks and benefits of medications with patient.  Discussed follow up with Primary care physician this week. Discussed follow up and return parameters including no resolution or any worsening concerns. Patient verbalized understanding and agreed to plan.   ____________________________________________   FINAL CLINICAL IMPRESSION(S) / ED DIAGNOSES  Final diagnoses:  Cough  Exacerbation of asthma, unspecified asthma severity, unspecified whether persistent  Acute frontal sinusitis, recurrence not specified     ED Discharge Orders         Ordered    predniSONE (DELTASONE) 20 MG tablet  Daily     03/28/19 1710    doxycycline (VIBRAMYCIN) 100 MG capsule  2 times daily     03/28/19 1710    benzonatate (TESSALON PERLES) 100 MG capsule  3 times daily PRN     03/28/19 1710           Note: This dictation was prepared with Dragon dictation along with smaller phrase technology. Any transcriptional errors that result from this process are unintentional.         Marylene Land, NP 03/28/19 1711

## 2019-03-28 NOTE — ED Triage Notes (Signed)
Pt c/o dry cough. Started about 3 weeks ago. Cough is worse at night. She has allergies and asthma and states they always get worse in the summer time. Denies fever, sore throat, loss of sense of smell of taste. " I know I don't have that virus"

## 2019-03-30 LAB — NOVEL CORONAVIRUS, NAA (HOSP ORDER, SEND-OUT TO REF LAB; TAT 18-24 HRS): SARS-CoV-2, NAA: NOT DETECTED

## 2019-03-31 ENCOUNTER — Other Ambulatory Visit: Payer: Self-pay | Admitting: Internal Medicine

## 2019-03-31 DIAGNOSIS — I10 Essential (primary) hypertension: Secondary | ICD-10-CM

## 2019-04-24 ENCOUNTER — Other Ambulatory Visit: Payer: Self-pay

## 2019-04-24 ENCOUNTER — Telehealth: Payer: Self-pay

## 2019-04-24 DIAGNOSIS — H6063 Unspecified chronic otitis externa, bilateral: Secondary | ICD-10-CM

## 2019-04-24 MED ORDER — NEOMYCIN-POLYMYXIN-HC 3.5-10000-1 OT SUSP: 3.0000 [drp] | Freq: Three times a day (TID) | OTIC | 0 refills | Status: DC | PRN

## 2019-04-24 NOTE — Telephone Encounter (Signed)
Patient called wanting to know if she can get a RF on her ear drops- she is having Rt ear pain. Sent this in for the patient.  She is also complaining of her cough. Mostly worse at night. Cannot wear CPAP at night because she cannot breathe and starts having coughing fits. She is using her nebulizer and she took the tessalon perles that were sent in, but they did not give her any relief. Wants to know if we can send in something else for her cough. Patient does not have a fever or body aches and only sometimes has mucous coming up. No SOB, or loss of taste or smell.  Please advise.

## 2019-04-24 NOTE — Telephone Encounter (Signed)
Patient informed she needs to contact Dr. Idamae Schuller about cough.

## 2019-04-24 NOTE — Telephone Encounter (Signed)
She needs to see Pulmonary about the cough.  Okay to refill ear drops.

## 2019-04-25 ENCOUNTER — Ambulatory Visit: Payer: Medicare Other

## 2019-04-26 ENCOUNTER — Encounter: Payer: Self-pay | Admitting: Emergency Medicine

## 2019-04-26 ENCOUNTER — Ambulatory Visit
Admission: EM | Admit: 2019-04-26 | Discharge: 2019-04-26 | Disposition: A | Discharge status: Home / Self Care | Payer: Medicare Other | Attending: Family Medicine | Admitting: Family Medicine

## 2019-04-26 ENCOUNTER — Other Ambulatory Visit: Payer: Self-pay

## 2019-04-26 DIAGNOSIS — R05 Cough: Secondary | ICD-10-CM | POA: Diagnosis not present

## 2019-04-26 DIAGNOSIS — Z87891 Personal history of nicotine dependence: Secondary | ICD-10-CM

## 2019-04-26 DIAGNOSIS — R059 Cough, unspecified: Secondary | ICD-10-CM

## 2019-04-26 MED ORDER — HYDROCOD POLST-CPM POLST ER 10-8 MG/5ML PO SUER: 5.0000 mL | Freq: Two times a day (BID) | ORAL | 0 refills | Status: DC | PRN

## 2019-04-26 NOTE — ED Provider Notes (Signed)
[facility]    CSN: [id number] Arrival date & time: 04/26/19  1133  History   Chief Complaint Chief Complaint  Patient presents with  . Cough    HPI  78 year old female presents with cough.  Patient recently seen on 8/12.  Was placed on antibiotics as well as prednisone.  Patient states that she improved.  However, 3 days ago developed recurrent cough.  She reports a dry cough.  She states that she has a tickle in the back of her throat.  Worse at night.  She is tried [employer] that were previously given to her without resolution.  She is tried over-the-counter cough medication without resolution.  No shortness of breath.  No fever.  No wheezing.  She endorses compliance with her home medications.  No other associated symptoms.  No other complaints.  PMH, Surgical Hx, Family Hx, Social History reviewed and updated as below.  PMH: Patient Active Problem List   Diagnosis Date Noted  . Pre-diabetes 05/29/2018  . Primary insomnia 09/15/2017  . Essential hypertension 09/09/2017  . 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 Bilateral lower extremity edema 09/09/2017  . Pain in both lower extremities 09/09/2017  . Current use of long term anticoagulation 07/28/2017  . 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 Otitis externa, chronic 12/03/2015  . Obstructive sleep apnea 10/07/2015  . Asthma, persistent controlled 09/25/2015  . Acquired hypothyroidism 02/29/2012  . Chronic atrial fibrillation 02/29/2012  . Hyperlipidemia 02/29/2012    Past Surgical History:  Procedure Laterality Date  . ABDOMINAL HYSTERECTOMY  1991   uterus only- still has cervix - done for prolapse  . COLONOSCOPY  2009   normal  . TOE SURGERY      OB History   No obstetric history on file.      Home Medications    Prior to Admission medications   Medication Sig Start Date End Date Taking? Authorizing Provider  ALPRAZolam (XANAX) 0.25  MG tablet Take 1 tablet (0.25 mg total) by mouth 2 (two) times daily as needed for anxiety. 11/09/18  Yes Glean Hess, MD  Ascorbic Acid (VITAMIN C) 1000 MG tablet Take 1,000 mg by mouth daily.   Yes [provider]  Calcium Carb-Cholecalciferol [id number] MG-UNIT TABS Take by mouth.   Yes [provider]  cholecalciferol (VITAMIN D) 1000 units tablet Take 1,000 Units by mouth daily.   Yes [provider]  fluticasone (FLONASE) 50 MCG/ACT nasal spray Place into both nostrils daily.   Yes [provider]  Fluticasone-Salmeterol (ADVAIR) 250-50 MCG/DOSE AEPB Inhale 1 puff into the lungs 2 (two) times daily.   Yes [provider]  furosemide (LASIX) 20 MG tablet Take 1 tablet (20 mg total) by mouth daily as needed. 04/05/18  Yes Glean Hess, MD  latanoprost (XALATAN) 0.005 % ophthalmic solution 1 drop at bedtime.   Yes [provider]  levothyroxine (SYNTHROID, LEVOTHROID) 75 MCG tablet Take 1 tablet (75 mcg total) by mouth daily before breakfast. 05/25/18  Yes Glean Hess, MD  losartan (COZAAR) 50 MG tablet Take 1 tablet (50 mg total) by mouth daily. 03/31/19  Yes Glean Hess, MD  MAGNESIUM-ZINC PO Take 351 mg by mouth at bedtime as needed.   Yes [provider]  Melatonin 5 MG TABS Take by mouth.   Yes [provider]  metoprolol tartrate (LOPRESSOR) 25 MG tablet Take 1 tablet (25 mg total) by mouth 2 (two) times daily.  05/25/18  Yes Glean Hess, MD  multivitamin-lutein [facility]) CAPS capsule Take 1 capsule by mouth daily.   Yes [provider]  Rivaroxaban (XARELTO) 15 MG TABS tablet Take 15 mg by mouth daily with supper.    Yes [provider]  albuterol (PROVENTIL) (2.5 MG/3ML) 0.083% nebulizer solution Inhale into the lungs. 04/11/17 03/28/19  [provider]  chlorpheniramine-HYDROcodone (TUSSIONEX PENNKINETIC ER) 10-8 MG/5ML SUER Take 5 mLs by mouth every 12 (twelve)  hours as needed. 04/26/19   Coral Spikes, DO  hydrochlorothiazide (HYDRODIURIL) 25 MG tablet Take 1 tablet (25 mg total) by mouth daily. 05/25/18   Glean Hess, MD  fexofenadine (ALLEGRA) 180 MG tablet Take 180 mg by mouth daily.  03/28/19  [provider]    Family History Family History  Problem Relation Age of Onset  . Breast cancer Other 56  . Dementia Mother   . Cancer Father        prostate and bone  . Heart attack Sister     Social History Social History   Tobacco Use  . Smoking status: Former Smoker    Packs/day: 0.50    Years: 29.00    Pack years: 14.50    Types: Cigarettes    Quit date: 09/28/1989    Years since quitting: 29.5  . Smokeless tobacco: Never Used  . Tobacco comment: smoking cessation materials not required  Substance Use Topics  . Alcohol use: No    Frequency: Never  . Drug use: No     Allergies   Tramadol and Meloxicam   Review of Systems Review of Systems  Constitutional: Negative for fever.  HENT: Positive for postnasal drip.   Respiratory: Positive for cough.    Physical Exam Triage Vital Signs ED Triage Vitals  Enc Vitals Group     BP 04/26/19 1157 (!) 154/91     Pulse Rate 04/26/19 1157 (!) 59     Resp 04/26/19 1157 18     Temp 04/26/19 1157 97.7 F (36.5 C)     Temp src --      SpO2 04/26/19 1157 98 %     Weight 04/26/19 1154 198 lb (89.8 kg)     Height 04/26/19 1154 5\' 7"  (1.702 m)     Head Circumference --      Peak Flow --      Pain Score 04/26/19 1153 0     Pain Loc --      Pain Edu? --      Excl. in [place]? --    Updated Vital Signs BP (!) 164/104 (BP Location: Right Arm)   Pulse (!) 59   Temp 97.7 F (36.5 C)   Resp 18   Ht 5\' 7"  (1.702 m)   Wt 89.8 kg   SpO2 98%   BMI 31.01 kg/m   Visual Acuity Right Eye Distance:   Left Eye Distance:   Bilateral Distance:    Right Eye Near:   Left Eye Near:    Bilateral Near:     Physical Exam Vitals signs and nursing note reviewed.  Constitutional:       General: She is not in acute distress.    Appearance: Normal appearance. She is obese. She is not ill-appearing.  HENT:     Head: Normocephalic.     Nose: Nose normal.     Mouth/Throat:     Pharynx: Oropharynx is clear.  Eyes:     General:  Right eye: No discharge.        Left eye: No discharge.     Conjunctiva/sclera: Conjunctivae normal.  Cardiovascular:     Rate and Rhythm: Regular rhythm. Bradycardia present.  Pulmonary:     Effort: Pulmonary effort is normal.     Breath sounds: Normal breath sounds. No wheezing, rhonchi or rales.  Neurological:     Mental Status: She is alert.  Psychiatric:        Mood and Affect: Mood normal.        Behavior: Behavior normal.    UC Treatments / Results  Labs (all labs ordered are listed, but only abnormal results are displayed) Labs Reviewed - No data to display  EKG   Radiology No results found.  Procedures Procedures (including critical care time)  Medications Ordered in UC Medications - No data to display  Initial Impression / Assessment and Plan / UC Course  I have reviewed the triage vital signs and the nursing notes.  Pertinent labs & imaging results that were available during my care of the patient were reviewed by me and considered in my medical decision making (see chart for details).    78 year old female presents with cough.  Given lack of improvement with over-the-counter medications, I am giving her a small supply of Tussionex. Continue Claritin and Flonase.  Final Clinical Impressions(s) / UC Diagnoses   Final diagnoses:  Cough     Discharge Instructions     Flonase and claritin.  Cough medication as directed.  Take care  Dr. Lacinda Axon    ED Prescriptions    Medication Sig Dispense Auth. Provider   chlorpheniramine-HYDROcodone (TUSSIONEX PENNKINETIC ER) 10-8 MG/5ML SUER Take 5 mLs by mouth every 12 (twelve) hours as needed. 60 mL Coral Spikes, DO     Controlled Substance Prescriptions  [place] Controlled Substance Registry consulted? Not Applicable   Coral Spikes, DO 04/26/19 1241

## 2019-04-26 NOTE — Discharge Instructions (Signed)
Flonase and claritin.  Cough medication as directed.  Take care  Dr. Lacinda Axon

## 2019-04-26 NOTE — ED Triage Notes (Signed)
Pt c/o cough. She was seen on 03/28/19 and was getting better while she was taking the antibiotic and prednisone but when she stopped the cough started back. Denies fever, sore throat. She h/o asthma.

## 2019-05-07 DIAGNOSIS — G4733 Obstructive sleep apnea (adult) (pediatric): Secondary | ICD-10-CM | POA: Diagnosis not present

## 2019-05-08 ENCOUNTER — Other Ambulatory Visit: Payer: Self-pay | Admitting: Internal Medicine

## 2019-05-08 DIAGNOSIS — F5101 Primary insomnia: Secondary | ICD-10-CM

## 2019-05-08 DIAGNOSIS — G4733 Obstructive sleep apnea (adult) (pediatric): Secondary | ICD-10-CM | POA: Diagnosis not present

## 2019-05-09 ENCOUNTER — Ambulatory Visit (INDEPENDENT_AMBULATORY_CARE_PROVIDER_SITE_OTHER): Payer: Medicare Other

## 2019-05-09 VITALS — Ht 67.0 in | Wt 200.0 lb

## 2019-05-09 DIAGNOSIS — Z1231 Encounter for screening mammogram for malignant neoplasm of breast: Secondary | ICD-10-CM

## 2019-05-09 DIAGNOSIS — Z780 Asymptomatic menopausal state: Secondary | ICD-10-CM

## 2019-05-09 DIAGNOSIS — Z0000 Encounter for general adult medical examination without abnormal findings: Secondary | ICD-10-CM | POA: Diagnosis not present

## 2019-05-09 NOTE — Progress Notes (Signed)
Subjective:   Ariana Guzman is a 78 y.o. female who presents for Medicare Annual (Subsequent) preventive examination.  Virtual Visit via Telephone Note  I connected with [name] on 05/09/19 at 11:20 AM EDT by telephone and verified that I am speaking with the correct person using two identifiers.  Medicare Annual Wellness visit completed telephonically due to Covid-19 pandemic.   Location: Patient: home Provider: office   I discussed the limitations, risks, security and privacy concerns of performing an evaluation and management service by telephone and the availability of in person appointments. The patient expressed understanding and agreed to proceed.  Some vital signs may be absent or patient reported.   Clemetine Marker, LPN    Review of Systems:   Cardiac Risk Factors include: advanced age (>41men, >29 women);hypertension;obesity (BMI >30kg/m2)     Objective:     Vitals: Ht 5\' 7"  (1.702 m)   Wt 200 lb (90.7 kg)   BMI 31.32 kg/m   Body mass index is 31.32 kg/m.  Advanced Directives 05/09/2019 04/24/2018 02/04/2018 11/29/2017 05/28/2017  Does Patient Have a Medical Advance Directive? No No No No No  Would patient like information on creating a medical advance directive? No - Patient declined Yes (MAU/Ambulatory/Procedural Areas - Information given) No - Patient declined No - Patient declined No - Patient declined    Tobacco Social History   Tobacco Use  Smoking Status Former Smoker  . Packs/day: 0.50  . Years: 29.00  . Pack years: 14.50  . Types: Cigarettes  . Quit date: 09/28/1989  . Years since quitting: 29.6  Smokeless Tobacco Never Used  Tobacco Comment   smoking cessation materials not required     Counseling given: Not Answered Comment: smoking cessation materials not required   Clinical Intake:  Pre-visit preparation completed: Yes  Pain : No/denies pain Pain Score: 0-No pain     BMI - recorded: 31.32 Nutritional Status: BMI > 30  Obese  Nutritional Risks: None Diabetes: No CBG done?: No Did pt. bring in CBG monitor from home?: No  How often do you need to have someone help you when you read instructions, pamphlets, or other written materials from your doctor or pharmacy?: 1 - Never  Interpreter Needed?: No  Information entered by :: Clemetine Marker LPN  Past Medical History:  Diagnosis Date  . Asthma   . Atrial fibrillation ([place])   . Hypertension   . Peripheral arterial disease [facility])    Past Surgical History:  Procedure Laterality Date  . ABDOMINAL HYSTERECTOMY  1991   uterus only- still has cervix - done for prolapse  . COLONOSCOPY  2009   normal  . TOE SURGERY     Family History  Problem Relation Age of Onset  . Breast cancer Other 56  . Dementia Mother   . Cancer Father        prostate and bone  . Heart attack Sister    Social History   Socioeconomic History  . Marital status: Widowed    Spouse name: Not on file  . Number of children: 2  . Years of education: Not on file  . Highest education level: 12th grade  Occupational History  . Occupation: retired  Scientific laboratory technician  . Financial resource strain: Not hard at all  . Food insecurity    Worry: Never true    Inability: Never true  . Transportation needs    Medical: No    Non-medical: No  Tobacco Use  . Smoking status:  Former Smoker    Packs/day: 0.50    Years: 29.00    Pack years: 14.50    Types: Cigarettes    Quit date: 09/28/1989    Years since quitting: 29.6  . Smokeless tobacco: Never Used  . Tobacco comment: smoking cessation materials not required  Substance and Sexual Activity  . Alcohol use: No    Frequency: Never  . Drug use: No  . Sexual activity: Not Currently  Lifestyle  . Physical activity    Days per week: 0 days    Minutes per session: 0 min  . Stress: Not at all  Relationships  . Social Herbalist on phone: Patient refused    Gets together: Patient refused    Attends religious service: Patient refused     Active member of club or organization: Patient refused    Attends meetings of clubs or organizations: Patient refused    Relationship status: Widowed  Other Topics Concern  . Not on file  Social History Narrative  . Not on file    Outpatient Encounter Medications as of 05/09/2019  Medication Sig  . ALPRAZolam (XANAX) 0.25 MG tablet Take 1 tablet by mouth twice daily as needed for anxiety  . Ascorbic Acid (VITAMIN C) 1000 MG tablet Take 1,000 mg by mouth daily.  . Calcium Carb-Cholecalciferol [phone] MG-UNIT TABS Take by mouth.  . cholecalciferol (VITAMIN D) 1000 units tablet Take 1,000 Units by mouth daily.  Marland Kitchen docusate sodium (COLACE) 100 MG capsule Take 100 mg by mouth daily.  . fluticasone (FLONASE) 50 MCG/ACT nasal spray Place into both nostrils daily.  . Fluticasone-Salmeterol (ADVAIR) 250-50 MCG/DOSE AEPB Inhale 1 puff into the lungs 2 (two) times daily.  . furosemide (LASIX) 20 MG tablet Take 1 tablet (20 mg total) by mouth daily as needed.  . hydrochlorothiazide (HYDRODIURIL) 25 MG tablet Take 1 tablet (25 mg total) by mouth daily.  Marland Kitchen latanoprost (XALATAN) 0.005 % ophthalmic solution 1 drop at bedtime.  Marland Kitchen levothyroxine (SYNTHROID, LEVOTHROID) 75 MCG tablet Take 1 tablet (75 mcg total) by mouth daily before breakfast.  . losartan (COZAAR) 50 MG tablet Take 1 tablet (50 mg total) by mouth daily.  Marland Kitchen MAGNESIUM-ZINC PO Take 351 mg by mouth at bedtime as needed.  . Melatonin 5 MG TABS Take by mouth.  . metoprolol tartrate (LOPRESSOR) 25 MG tablet Take 1 tablet (25 mg total) by mouth 2 (two) times daily.  . multivitamin-lutein (OCUVITE-LUTEIN) CAPS capsule Take 1 capsule by mouth daily.  . Rivaroxaban (XARELTO) 15 MG TABS tablet Take 15 mg by mouth daily with supper.   Marland Kitchen albuterol (PROVENTIL) (2.5 MG/3ML) 0.083% nebulizer solution Inhale into the lungs.  . [DISCONTINUED] chlorpheniramine-HYDROcodone (TUSSIONEX PENNKINETIC ER) 10-8 MG/5ML SUER Take 5 mLs by mouth every 12 (twelve)  hours as needed.  . [DISCONTINUED] fexofenadine (ALLEGRA) 180 MG tablet Take 180 mg by mouth daily.   No facility-administered encounter medications on file as of 05/09/2019.     Activities of Daily Living In your present state of health, do you have any difficulty performing the following activities: 05/09/2019  Hearing? N  Comment declines hearing aids  Vision? N  Difficulty concentrating or making decisions? N  Walking or climbing stairs? N  Dressing or bathing? N  Doing errands, shopping? N  Preparing Food and eating ? N  Using the Toilet? N  In the past six months, have you accidently leaked urine? N  Do you have problems with loss of bowel control? N  Managing your Medications? N  Managing your Finances? N  Housekeeping or managing your Housekeeping? N  Some recent data might be hidden    Patient Care Team: Glean Hess, MD as PCP - General (Internal Medicine) Idamae Schuller, Laban Emperor, MD as Consulting Physician (Pulmonary Disease) Edwyna Ready, MD as Consulting Physician (Cardiology) Dr Jerrilyn Cairo as Consulting Physician (Sleep Medicine) Eulogio Bear, MD as Consulting Physician (Ophthalmology)    Assessment:   This is a routine wellness examination for [facility].  Exercise Activities and Dietary recommendations Current Exercise Habits: The patient does not participate in regular exercise at present, Exercise limited by: respiratory conditions(s)  Goals    . DIET - INCREASE WATER INTAKE     Recommend to drink at least 6-8 8oz glasses of water per day.       Fall Risk Fall Risk  05/09/2019 01/03/2019 11/09/2018 09/04/2018 04/24/2018  Falls in the past year? 0 0 0 0 No  Number falls in past yr: 0 0 0 0 -  Injury with Fall? 0 0 0 0 -  Risk for fall due to : - - - - Impaired vision  Risk for fall due to: Comment - - - - wears eyeglasses  Follow up Falls prevention discussed Falls evaluation completed Falls evaluation completed Falls evaluation completed -    FALL RISK PREVENTION PERTAINING TO THE HOME:  Any stairs in or around the home? No  If so, do they handrails? No   Home free of loose throw rugs in walkways, pet beds, electrical cords, etc? Yes  Adequate lighting in your home to reduce risk of falls? Yes   ASSISTIVE DEVICES UTILIZED TO PREVENT FALLS:  Life alert? No Use of a cane, walker or w/c? No  Grab bars in the bathroom? No  Shower chair or bench in shower? Yes  Elevated toilet seat or a handicapped toilet? No   DME ORDERS:  DME order needed?  No   TIMED UP AND GO:  Was the test performed? No . Telephonic visit.   Education: Fall risk prevention has been discussed.  Intervention(s) required? No    Depression Screen PHQ 2/9 Scores 05/09/2019 01/03/2019 11/09/2018 09/04/2018  PHQ - 2 Score 0 0 0 0  PHQ- 9 Score - - - -     Cognitive Function     6CIT Screen 05/09/2019 04/24/2018  What Year? 0 points 0 points  What month? 0 points 0 points  What time? 0 points 3 points  Count back from 20 0 points 0 points  Months in reverse 0 points 0 points  Repeat phrase 0 points 0 points  Total Score 0 3    Immunization History  Administered Date(s) Administered  . Influenza, High Dose Seasonal PF 08/11/2017, 04/24/2018  . Pneumococcal Conjugate-13 04/24/2018  . Tdap 02/26/2015    Qualifies for Shingles Vaccine? Yes . Due for Shingrix. Education has been provided regarding the importance of this vaccine. Pt has been advised to call insurance company to determine out of pocket expense. Advised may also receive vaccine at local pharmacy or Health Dept. Verbalized acceptance and understanding.  Tdap: Up to date  Flu Vaccine: Due for Flu vaccine. Does the patient want to receive this vaccine today?  No . Education has been provided regarding the importance of this vaccine but still declined. Advised may receive this vaccine at local pharmacy or Health Dept. Aware to provide a copy of the vaccination record if obtained from local  pharmacy or Health Dept. Verbalized acceptance  and understanding.  Pneumococcal Vaccine: Due for Pneumococcal vaccine. Does the patient want to receive this vaccine today?  No . Education has been provided regarding the importance of this vaccine but still declined. Advised may receive this vaccine at local pharmacy or Health Dept. Aware to provide a copy of the vaccination record if obtained from local pharmacy or Health Dept. Verbalized acceptance and understanding.   Screening Tests Health Maintenance  Topic Date Due  . DEXA SCAN  08/08/2004  . INFLUENZA VACCINE  03/17/2019  . PNA vac Low Risk Adult (2 of 2 - PPSV23) 04/25/2019  . MAMMOGRAM  08/03/2019  . TETANUS/TDAP  02/25/2025    Cancer Screenings:  Colorectal Screening: No longer required.   Mammogram: Completed 08/02/18. Ordered today. Pt provided with contact information and advised to call to schedule appt.   Bone Density: Completed per patient in [place]. Results unavailable. Repeat every 2 years. Ordered today. Pt provided with contact information and advised to call to schedule appt.   Lung Cancer Screening: (Low Dose CT Chest recommended if Age 18-80 years, 30 pack-year currently smoking OR have quit w/in 15years.) does not qualify.    Additional Screening:  Hepatitis C Screening: no longer required   Vision Screening: Recommended annual ophthalmology exams for early detection of glaucoma and other disorders of the eye. Is the patient up to date with their annual eye exam?  Yes  Who is the provider or what is the name of the office in which the pt attends annual eye exams? Dr. Edison Pace [facility].  Dental Screening: Recommended annual dental exams for proper oral hygiene  Community Resource Referral:  CRR required this visit?  No      Plan:     I have personally reviewed and addressed the Medicare Annual Wellness questionnaire and have noted the following in the patient's chart:  A. Medical and  social history B. Use of alcohol, tobacco or illicit drugs  C. Current medications and supplements D. Functional ability and status E.  Nutritional status F.  Physical activity G. Advance directives H. List of other physicians I.  Hospitalizations, surgeries, and ER visits in previous 12 months J.  [place] such as hearing and vision if needed, cognitive and depression L. Referrals and appointments   In addition, I have reviewed and discussed with patient certain preventive protocols, quality metrics, and best practice recommendations. A written personalized care plan for preventive services as well as general preventive health recommendations were provided to patient.   Signed,  Clemetine Marker, LPN Nurse Health Advisor   Nurse Notes: none

## 2019-05-09 NOTE — Patient Instructions (Signed)
Ariana Guzman , Thank you for taking time to come for your Medicare Wellness Visit. I appreciate your ongoing commitment to your health goals. Please review the following plan we discussed and let me know if I can assist you in the future.   Screening recommendations/referrals: Colonoscopy: no longer required Mammogram: done 08/02/18. Please call [phone] to schedule your mammogram and bone density screening.   Recommended yearly ophthalmology/optometry visit for glaucoma screening and checkup Recommended yearly dental visit for hygiene and checkup  Vaccinations: Influenza vaccine: due Pneumococcal vaccine: due for second dose Tdap vaccine: done 02/26/15 Shingles vaccine: Shingrix discussed. Please contact your pharmacy for coverage information.   Advanced directives: Advance directive discussed with you today. Even though you declined this today please call our office should you change your mind and we can give you the proper paperwork for you to fill out.  Conditions/risks identified: Recommend drinking 6-8 glasses of water per day  Next appointment: Please follow up in one year for your Medicare Annual Wellness visit.     Preventive Care 53 Years and Older, Female Preventive care refers to lifestyle choices and visits with your health care provider that can promote health and wellness. What does preventive care include?  A yearly physical exam. This is also called an annual well check.  Dental exams once or twice a year.  Routine eye exams. Ask your health care provider how often you should have your eyes checked.  Personal lifestyle choices, including:  Daily care of your teeth and gums.  Regular physical activity.  Eating a healthy diet.  Avoiding tobacco and drug use.  Limiting alcohol use.  Practicing safe sex.  Taking low-dose aspirin every day.  Taking vitamin and mineral supplements as recommended by your health care provider. What happens during an annual  well check? The services and screenings done by your health care provider during your annual well check will depend on your age, overall health, lifestyle risk factors, and family history of disease. Counseling  Your health care provider may ask you questions about your:  Alcohol use.  Tobacco use.  Drug use.  Emotional well-being.  Home and relationship well-being.  Sexual activity.  Eating habits.  History of falls.  Memory and ability to understand (cognition).  Work and work Statistician.  Reproductive health. Screening  You may have the following tests or measurements:  Height, weight, and BMI.  Blood pressure.  Lipid and cholesterol levels. These may be checked every 5 years, or more frequently if you are over 82 years old.  Skin check.  Lung cancer screening. You may have this screening every year starting at age 3 if you have a 30-pack-year history of smoking and currently smoke or have quit within the past 15 years.  Fecal occult blood test (FOBT) of the stool. You may have this test every year starting at age 5.  Flexible sigmoidoscopy or colonoscopy. You may have a sigmoidoscopy every 5 years or a colonoscopy every 10 years starting at age 2.  Hepatitis C blood test.  Hepatitis B blood test.  Sexually transmitted disease (STD) testing.  Diabetes screening. This is done by checking your blood sugar (glucose) after you have not eaten for a while (fasting). You may have this done every 1-3 years.  Bone density scan. This is done to screen for osteoporosis. You may have this done starting at age 70.  Mammogram. This may be done every 1-2 years. Talk to your health care provider about how often you should have  regular mammograms. Talk with your health care provider about your test results, treatment options, and if necessary, the need for more tests. Vaccines  Your health care provider may recommend certain vaccines, such as:  Influenza vaccine. This  is recommended every year.  Tetanus, diphtheria, and acellular pertussis (Tdap, Td) vaccine. You may need a Td booster every 10 years.  Zoster vaccine. You may need this after age 77.  Pneumococcal 13-valent conjugate (PCV13) vaccine. One dose is recommended after age 36.  Pneumococcal polysaccharide (PPSV23) vaccine. One dose is recommended after age 23. Talk to your health care provider about which screenings and vaccines you need and how often you need them. This information is not intended to replace advice given to you by your health care provider. Make sure you discuss any questions you have with your health care provider. Document Released: 08/29/2015 Document Revised: 04/21/2016 Document Reviewed: 06/03/2015 Elsevier Interactive Patient Education  2017 [place] Prevention in the Home Falls can cause injuries. They can happen to people of all ages. There are many things you can do to make your home safe and to help prevent falls. What can I do on the outside of my home?  Regularly fix the edges of walkways and driveways and fix any cracks.  Remove anything that might make you trip as you walk through a door, such as a raised step or threshold.  Trim any bushes or trees on the path to your home.  Use bright outdoor lighting.  Clear any walking paths of anything that might make someone trip, such as rocks or tools.  Regularly check to see if handrails are loose or broken. Make sure that both sides of any steps have handrails.  Any raised decks and porches should have guardrails on the edges.  Have any leaves, snow, or ice cleared regularly.  Use sand or salt on walking paths during winter.  Clean up any spills in your garage right away. This includes oil or grease spills. What can I do in the bathroom?  Use night lights.  Install grab bars by the toilet and in the tub and shower. Do not use towel bars as grab bars.  Use non-skid mats or decals in the tub or  shower.  If you need to sit down in the shower, use a plastic, non-slip stool.  Keep the floor dry. Clean up any water that spills on the floor as soon as it happens.  Remove soap buildup in the tub or shower regularly.  Attach bath mats securely with double-sided non-slip rug tape.  Do not have throw rugs and other things on the floor that can make you trip. What can I do in the bedroom?  Use night lights.  Make sure that you have a light by your bed that is easy to reach.  Do not use any sheets or blankets that are too big for your bed. They should not hang down onto the floor.  Have a firm chair that has side arms. You can use this for support while you get dressed.  Do not have throw rugs and other things on the floor that can make you trip. What can I do in the kitchen?  Clean up any spills right away.  Avoid walking on wet floors.  Keep items that you use a lot in easy-to-reach places.  If you need to reach something above you, use a strong step stool that has a grab bar.  Keep electrical cords out of the  way.  Do not use floor polish or wax that makes floors slippery. If you must use wax, use non-skid floor wax.  Do not have throw rugs and other things on the floor that can make you trip. What can I do with my stairs?  Do not leave any items on the stairs.  Make sure that there are handrails on both sides of the stairs and use them. Fix handrails that are broken or loose. Make sure that handrails are as long as the stairways.  Check any carpeting to make sure that it is firmly attached to the stairs. Fix any carpet that is loose or worn.  Avoid having throw rugs at the top or bottom of the stairs. If you do have throw rugs, attach them to the floor with carpet tape.  Make sure that you have a light switch at the top of the stairs and the bottom of the stairs. If you do not have them, ask someone to add them for you. What else can I do to help prevent falls?   Wear shoes that:  Do not have high heels.  Have rubber bottoms.  Are comfortable and fit you well.  Are closed at the toe. Do not wear sandals.  If you use a stepladder:  Make sure that it is fully opened. Do not climb a closed stepladder.  Make sure that both sides of the stepladder are locked into place.  Ask someone to hold it for you, if possible.  Clearly mark and make sure that you can see:  Any grab bars or handrails.  First and last steps.  Where the edge of each step is.  Use tools that help you move around (mobility aids) if they are needed. These include:  Canes.  Walkers.  Scooters.  Crutches.  Turn on the lights when you go into a dark area. Replace any light bulbs as soon as they burn out.  Set up your furniture so you have a clear path. Avoid moving your furniture around.  If any of your floors are uneven, fix them.  If there are any pets around you, be aware of where they are.  Review your medicines with your doctor. Some medicines can make you feel dizzy. This can increase your chance of falling. Ask your doctor what other things that you can do to help prevent falls. This information is not intended to replace advice given to you by your health care provider. Make sure you discuss any questions you have with your health care provider. Document Released: 05/29/2009 Document Revised: 01/08/2016 Document Reviewed: 09/06/2014 Elsevier Interactive Patient Education  2017 [employer].

## 2019-05-23 DIAGNOSIS — G4733 Obstructive sleep apnea (adult) (pediatric): Secondary | ICD-10-CM | POA: Diagnosis not present

## 2019-05-28 ENCOUNTER — Ambulatory Visit (INDEPENDENT_AMBULATORY_CARE_PROVIDER_SITE_OTHER): Payer: Medicare Other | Admitting: Internal Medicine

## 2019-05-28 ENCOUNTER — Encounter: Payer: Self-pay | Admitting: Internal Medicine

## 2019-05-28 ENCOUNTER — Other Ambulatory Visit: Payer: Self-pay

## 2019-05-28 VITALS — BP 148/70 | HR 83 | Temp 98.1°F | Ht 67.0 in | Wt 205.0 lb

## 2019-05-28 DIAGNOSIS — Z0000 Encounter for general adult medical examination without abnormal findings: Secondary | ICD-10-CM

## 2019-05-28 DIAGNOSIS — J45998 Other asthma: Secondary | ICD-10-CM

## 2019-05-28 DIAGNOSIS — I482 Chronic atrial fibrillation, unspecified: Secondary | ICD-10-CM

## 2019-05-28 DIAGNOSIS — E782 Mixed hyperlipidemia: Secondary | ICD-10-CM

## 2019-05-28 DIAGNOSIS — F5101 Primary insomnia: Secondary | ICD-10-CM

## 2019-05-28 DIAGNOSIS — Z23 Encounter for immunization: Secondary | ICD-10-CM

## 2019-05-28 DIAGNOSIS — I10 Essential (primary) hypertension: Secondary | ICD-10-CM

## 2019-05-28 DIAGNOSIS — E039 Hypothyroidism, unspecified: Secondary | ICD-10-CM

## 2019-05-28 DIAGNOSIS — R7303 Prediabetes: Secondary | ICD-10-CM

## 2019-05-28 LAB — POCT URINALYSIS DIPSTICK
Bilirubin, UA: NEGATIVE
Blood, UA: NEGATIVE
Glucose, UA: NEGATIVE
Ketones, UA: NEGATIVE
Leukocytes, UA: NEGATIVE
Nitrite, UA: NEGATIVE
Protein, UA: NEGATIVE
Spec Grav, UA: 1.015 (ref 1.010–1.025)
Urobilinogen, UA: 0.2 E.U./dL
pH, UA: 6.5 (ref 5.0–8.0)

## 2019-05-28 MED ORDER — MONTELUKAST SODIUM 10 MG PO TABS: 10.0000 mg | ORAL_TABLET | Freq: Every day | ORAL | 5 refills | Status: DC

## 2019-05-28 MED ORDER — BELSOMRA 10 MG PO TABS: 10.0000 mg | ORAL_TABLET | Freq: Every evening | ORAL | 0 refills | Status: DC | PRN

## 2019-05-28 NOTE — Progress Notes (Signed)
Date:  05/28/2019   Name:  Ariana Guzman   DOB:  02-26-1941   MRN:  [id number]   Chief Complaint: Annual Exam (Breast Exam. No pap. High dose flu shot and PPV23.) and Allergies (Started a week ago. No fever. Says she has not came at the house. When laying down she has trouble sleeping due to coughing fits. Wants to discuss cough.) Ariana Guzman is a 78 y.o. female who presents today for her Complete Annual Exam. She feels fairly well. She reports exercising none. She reports she is sleeping poorly unless she take melatonin and xanax.   Mammogram  07/2018 Colonoscopy Immunizations PPV-23 due  Hypertension Pertinent negatives include no chest pain, headaches, palpitations or shortness of breath. Past treatments include angiotensin blockers, diuretics and beta blockers. The current treatment provides moderate improvement. Identifiable causes of hypertension include a thyroid problem.  Thyroid Problem Presents for follow-up visit. Patient reports no anxiety, constipation, diarrhea, fatigue, palpitations or tremors. The symptoms have been stable.  Diabetes She presents for her follow-up diabetic visit. Diabetes type: pre-diabetes. Pertinent negatives for hypoglycemia include no dizziness, headaches, nervousness/anxiousness or tremors. Pertinent negatives for diabetes include no chest pain, no fatigue, no polydipsia and no polyuria.  Asthma She complains of cough and wheezing. There is no shortness of breath. This is a recurrent problem. The problem occurs daily. The problem has been waxing and waning. The cough is non-productive and dry. Associated symptoms include ear congestion, nasal congestion and postnasal drip. Pertinent negatives include no chest pain, fever, headaches, rhinorrhea, sneezing or trouble swallowing. Her symptoms are aggravated by lying down and any activity. Her symptoms are alleviated by beta-agonist and steroid inhaler (recently took a prednisone taper as well). She reports  moderate improvement on treatment.  Insomnia Primary symptoms: sleep disturbance, difficulty falling asleep, frequent awakening.  The current episode started more than one year. The problem occurs nightly. The problem is unchanged. Relieved by: melatonin; xanax but wants higher dose to work better. The treatment provided moderate relief.    Review of Systems  Constitutional: Negative for chills, fatigue and fever.  HENT: Positive for postnasal drip. Negative for congestion, hearing loss, rhinorrhea, sneezing, tinnitus, trouble swallowing and voice change.   Eyes: Negative for visual disturbance.  Respiratory: Positive for cough and wheezing. Negative for chest tightness and shortness of breath.   Cardiovascular: Negative for chest pain, palpitations and leg swelling.  Gastrointestinal: Negative for abdominal pain, constipation, diarrhea and vomiting.  Endocrine: Negative for polydipsia and polyuria.  Genitourinary: Negative for dysuria, frequency, genital sores, vaginal bleeding and vaginal discharge.  Musculoskeletal: Negative for arthralgias, gait problem and joint swelling.  Skin: Negative for color change and rash.  Allergic/Immunologic: Positive for environmental allergies.  Neurological: Negative for dizziness, tremors, light-headedness and headaches.  Hematological: Negative for adenopathy. Does not bruise/bleed easily.  Psychiatric/Behavioral: Positive for sleep disturbance. Negative for dysphoric mood. The patient has insomnia. The patient is not nervous/anxious.     Patient Active Problem List   Diagnosis Date Noted  . Pre-diabetes 05/29/2018  . Primary insomnia 09/15/2017  . Essential hypertension 09/09/2017  . 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 Bilateral lower extremity edema 09/09/2017  . Pain in both lower extremities 09/09/2017  . Current use of long term anticoagulation 07/28/2017  . Bilateral high frequency sensorineural hearing loss  12/03/2015  . Otitis externa, chronic 12/03/2015  . Obstructive sleep apnea 10/07/2015  . Asthma, persistent controlled 09/25/2015  . Acquired hypothyroidism 02/29/2012  .  Chronic atrial fibrillation ([place]) 02/29/2012  . Hyperlipidemia 02/29/2012    Allergies  Allergen Reactions  . Tramadol Shortness Of Breath  . Meloxicam Rash    Past Surgical History:  Procedure Laterality Date  . ABDOMINAL HYSTERECTOMY  1991   uterus only- still has cervix - done for prolapse  . COLONOSCOPY  2009   normal  . TOE SURGERY      Social History   Tobacco Use  . Smoking status: Former Smoker    Packs/day: 0.50    Years: 29.00    Pack years: 14.50    Types: Cigarettes    Quit date: 09/28/1989    Years since quitting: 29.6  . Smokeless tobacco: Never Used  . Tobacco comment: smoking cessation materials not required  Substance Use Topics  . Alcohol use: No    Frequency: Never  . Drug use: No     Medication list has been reviewed and updated.  Current Meds  Medication Sig  . albuterol (PROVENTIL) (2.5 MG/3ML) 0.083% nebulizer solution Inhale into the lungs.  . ALPRAZolam (XANAX) 0.25 MG tablet Take 1 tablet by mouth twice daily as needed for anxiety  . Ascorbic Acid (VITAMIN C) 1000 MG tablet Take 1,000 mg by mouth daily.  . Calcium Carb-Cholecalciferol [id number] MG-UNIT TABS Take by mouth.  . cholecalciferol (VITAMIN D) 1000 units tablet Take 1,000 Units by mouth daily.  Marland Kitchen docusate sodium (COLACE) 100 MG capsule Take 100 mg by mouth daily.  . fluticasone (FLONASE) 50 MCG/ACT nasal spray Place into both nostrils daily.  . Fluticasone-Salmeterol (ADVAIR) 250-50 MCG/DOSE AEPB Inhale 1 puff into the lungs 2 (two) times daily.  . hydrochlorothiazide (HYDRODIURIL) 25 MG tablet Take 1 tablet (25 mg total) by mouth daily.  Marland Kitchen latanoprost (XALATAN) 0.005 % ophthalmic solution 1 drop at bedtime.  Marland Kitchen levothyroxine (SYNTHROID, LEVOTHROID) 75 MCG tablet Take 1 tablet (75 mcg total) by mouth daily  before breakfast.  . losartan (COZAAR) 50 MG tablet Take 1 tablet (50 mg total) by mouth daily.  Marland Kitchen MAGNESIUM-ZINC PO Take 500 mg by mouth at bedtime as needed.   . metoprolol tartrate (LOPRESSOR) 25 MG tablet Take 1 tablet (25 mg total) by mouth 2 (two) times daily.  . multivitamin-lutein (OCUVITE-LUTEIN) CAPS capsule Take 1 capsule by mouth daily.  . Rivaroxaban (XARELTO) 15 MG TABS tablet Take 15 mg by mouth daily with supper.     PHQ 2/9 Scores 05/28/2019 05/09/2019 01/03/2019 11/09/2018  PHQ - 2 Score 0 0 0 0  PHQ- 9 Score - - - -    BP Readings from Last 3 Encounters:  05/28/19 (!) 148/70  04/26/19 (!) 164/104  03/28/19 (!) 166/80    Physical Exam Vitals signs and nursing note reviewed.  Constitutional:      General: She is not in acute distress.    Appearance: She is well-developed.  HENT:     Head: Normocephalic and atraumatic.     Right Ear: Tympanic membrane and ear canal normal.     Left Ear: Tympanic membrane and ear canal normal.     Nose:     Right Sinus: No maxillary sinus tenderness.     Left Sinus: No maxillary sinus tenderness.  Eyes:     General: No scleral icterus.       Right eye: No discharge.        Left eye: No discharge.     Conjunctiva/sclera: Conjunctivae normal.  Neck:     Musculoskeletal: Normal range of motion. No erythema.  Thyroid: No thyromegaly.     Vascular: No carotid bruit.  Cardiovascular:     Rate and Rhythm: Normal rate and regular rhythm.     Pulses: Normal pulses.     Heart sounds: Normal heart sounds.  Pulmonary:     Effort: Pulmonary effort is normal. No respiratory distress.     Breath sounds: No wheezing.  Chest:     Breasts:        Right: No mass, nipple discharge, skin change or tenderness.        Left: No mass, nipple discharge, skin change or tenderness.  Abdominal:     General: Bowel sounds are normal.     Palpations: Abdomen is soft.     Tenderness: There is no abdominal tenderness.  Musculoskeletal: Normal  range of motion.  Lymphadenopathy:     Cervical: No cervical adenopathy.  Skin:    General: Skin is warm and dry.     Findings: No rash.  Neurological:     Mental Status: She is alert and oriented to person, place, and time.     Cranial Nerves: No cranial nerve deficit.     Sensory: No sensory deficit.     Deep Tendon Reflexes: Reflexes are normal and symmetric.  Psychiatric:        Speech: Speech normal.        Behavior: Behavior normal.        Thought Content: Thought content normal.     Wt Readings from Last 3 Encounters:  05/28/19 205 lb (93 kg)  05/09/19 200 lb (90.7 kg)  04/26/19 198 lb (89.8 kg)    BP (!) 148/70   Pulse 83   Temp 98.1 F (36.7 C) (Oral)   Ht 5\' 7"  (1.702 m)   Wt 205 lb (93 kg)   SpO2 98%   BMI 32.11 kg/m   Assessment and Plan: 1. Annual physical exam Normal exam except for mild anxiety and BMI - POCT urinalysis dipstick  2. Asthma, persistent controlled Cough that is non productive - could be asthma or allergies Will add singulair to regimen Consider trial of Treligy in place of Advair - montelukast (SINGULAIR) 10 MG tablet; Take 1 tablet (10 mg total) by mouth at bedtime.  Dispense: 30 tablet; Refill: 5  3. Chronic atrial fibrillation (HCC) In SR today by exam Pt is clinically stable, rate controlled on DOAC with no bleeding issues She is followed by Cardiology but only once a year  4. Essential hypertension Not well controlled but could be exacerbated by cough Continue to monitor - adjust medications if persistently elevated - CBC with Differential/Platelet  5. Acquired hypothyroidism Supplemented, check labs and advise - TSH + free T4  6. Mixed hyperlipidemia Not on statin due to age and pt preference - Lipid panel  7. Pre-diabetes Clinically stable without s/s to suggest uncontrolled BS Encouraged low carb diet and will advise on medications - Comprehensive metabolic panel - Hemoglobin A1c  8. Primary insomnia I advised  that I will not increase the dose of Xanax as it is contraindicated in elderly persons Recommend trial of Belsomra instead - Suvorexant (BELSOMRA) 10 MG TABS; Take 10 mg by mouth at bedtime as needed.  Dispense: 30 tablet; Refill: 0  9. Need for vaccination for pneumococcus - Pneumococcal polysaccharide vaccine 23-valent greater than or equal to 2yo subcutaneous/IM  10. Need for immunization against influenza - Flu Vaccine QUAD High Dose(Fluad)   Partially dictated using Editor, commissioning. Any errors are unintentional.  Halina Maidens,  MD [facility]  05/28/2019

## 2019-05-29 LAB — COMPREHENSIVE METABOLIC PANEL
ALT: 15 IU/L (ref 0–32)
AST: 18 IU/L (ref 0–40)
Albumin/Globulin Ratio: 1.8 (ref 1.2–2.2)
Albumin: 4.2 g/dL (ref 3.7–4.7)
Alkaline Phosphatase: 60 IU/L (ref 39–117)
BUN/Creatinine Ratio: 12 (ref 12–28)
BUN: 11 mg/dL (ref 8–27)
Bilirubin Total: 1.1 mg/dL (ref 0.0–1.2)
CO2: 28 mmol/L (ref 20–29)
Calcium: 9.7 mg/dL (ref 8.7–10.3)
Chloride: 86 mmol/L — ABNORMAL LOW (ref 96–106)
Creatinine, Ser: 0.89 mg/dL (ref 0.57–1.00)
GFR calc Af Amer: 71 mL/min/{1.73_m2} (ref >59)
GFR calc non Af Amer: 62 mL/min/{1.73_m2} (ref >59)
Globulin, Total: 2.4 g/dL (ref 1.5–4.5)
Glucose: 125 mg/dL — ABNORMAL HIGH (ref 65–99)
Potassium: 3.8 mmol/L (ref 3.5–5.2)
Sodium: 129 mmol/L — ABNORMAL LOW (ref 134–144)
Total Protein: 6.6 g/dL (ref 6.0–8.5)

## 2019-05-29 LAB — LIPID PANEL
Chol/HDL Ratio: 2.9 ratio (ref 0.0–4.4)
Cholesterol, Total: 174 mg/dL (ref 100–199)
HDL: 59 mg/dL (ref >39)
LDL Chol Calc (NIH): 99 mg/dL (ref 0–99)
Triglycerides: 84 mg/dL (ref 0–149)
VLDL Cholesterol Cal: 16 mg/dL (ref 5–40)

## 2019-05-29 LAB — TSH+FREE T4
Free T4: 1.72 ng/dL (ref 0.82–1.77)
TSH: 1.54 u[IU]/mL (ref 0.450–4.500)

## 2019-05-29 LAB — CBC WITH DIFFERENTIAL/PLATELET
Basophils Absolute: 0.1 10*3/uL (ref 0.0–0.2)
Basos: 1 %
EOS (ABSOLUTE): 0.4 10*3/uL (ref 0.0–0.4)
Eos: 5 %
Hematocrit: 40.7 % (ref 34.0–46.6)
Hemoglobin: 13.9 g/dL (ref 11.1–15.9)
Immature Grans (Abs): 0 10*3/uL (ref 0.0–0.1)
Immature Granulocytes: 0 %
Lymphocytes Absolute: 2 10*3/uL (ref 0.7–3.1)
Lymphs: 24 %
MCH: 30.6 pg (ref 26.6–33.0)
MCHC: 34.2 g/dL (ref 31.5–35.7)
MCV: 90 fL (ref 79–97)
Monocytes Absolute: 0.9 10*3/uL (ref 0.1–0.9)
Monocytes: 11 %
Neutrophils Absolute: 4.8 10*3/uL (ref 1.4–7.0)
Neutrophils: 59 %
Platelets: 290 10*3/uL (ref 150–450)
RBC: 4.54 x10E6/uL (ref 3.77–5.28)
RDW: 13 % (ref 11.7–15.4)
WBC: 8.3 10*3/uL (ref 3.4–10.8)

## 2019-05-29 LAB — HEMOGLOBIN A1C
Est. average glucose Bld gHb Est-mCnc: 143 mg/dL
Hgb A1c MFr Bld: 6.6 % — ABNORMAL HIGH (ref 4.8–5.6)

## 2019-05-30 ENCOUNTER — Telehealth: Payer: Self-pay

## 2019-05-30 NOTE — Telephone Encounter (Signed)
Patient called saying she is still struggling with cough. Just started on the Singulair so cannot tell if this is helping her or not. Wants some more Tessalon Perles to help with cough at night.   Please advise.  Also, said Belsomra is too expensive for her and she did not pick up the medication from the pharmacy.

## 2019-05-31 ENCOUNTER — Other Ambulatory Visit: Payer: Self-pay | Admitting: Internal Medicine

## 2019-05-31 DIAGNOSIS — R059 Cough, unspecified: Secondary | ICD-10-CM

## 2019-05-31 DIAGNOSIS — R05 Cough: Secondary | ICD-10-CM

## 2019-05-31 MED ORDER — BENZONATATE 100 MG PO CAPS: 100.0000 mg | ORAL_CAPSULE | Freq: Every evening | ORAL | 0 refills | Status: DC | PRN

## 2019-05-31 NOTE — Telephone Encounter (Signed)
She needs to continue the singulair - she has not given it near enough time.  I will send her more tessalon perles.

## 2019-06-01 NOTE — Telephone Encounter (Signed)
Patient informed on VM with detailed message of Dr. Gaspar Cola note.

## 2019-06-27 ENCOUNTER — Telehealth: Payer: Self-pay

## 2019-06-27 NOTE — Telephone Encounter (Signed)
Patient called complaining that she is still coughing. Its much worse at night when she lays down to go to bed.  Called and left detailed msg informing patient she needs to contact her Pulmonologist to find out next steps as what to do for her cough. Told her to call Dr. Wyline Beady office and gave her their number.  Benedict Needy, CMA

## 2019-06-28 ENCOUNTER — Other Ambulatory Visit: Payer: Self-pay

## 2019-06-28 DIAGNOSIS — H6690 Otitis media, unspecified, unspecified ear: Secondary | ICD-10-CM

## 2019-06-28 DIAGNOSIS — H9193 Unspecified hearing loss, bilateral: Secondary | ICD-10-CM

## 2019-06-28 NOTE — Progress Notes (Signed)
ENT

## 2019-07-14 ENCOUNTER — Other Ambulatory Visit: Payer: Self-pay | Admitting: Internal Medicine

## 2019-07-14 DIAGNOSIS — I10 Essential (primary) hypertension: Secondary | ICD-10-CM

## 2019-07-14 DIAGNOSIS — E039 Hypothyroidism, unspecified: Secondary | ICD-10-CM

## 2019-07-16 ENCOUNTER — Other Ambulatory Visit: Payer: Self-pay

## 2019-07-16 DIAGNOSIS — I10 Essential (primary) hypertension: Secondary | ICD-10-CM

## 2019-07-16 DIAGNOSIS — E039 Hypothyroidism, unspecified: Secondary | ICD-10-CM

## 2019-07-16 MED ORDER — METOPROLOL TARTRATE 25 MG PO TABS: 25.0000 mg | ORAL_TABLET | Freq: Two times a day (BID) | ORAL | 3 refills | Status: DC

## 2019-07-16 MED ORDER — LEVOTHYROXINE SODIUM 75 MCG PO TABS: ORAL_TABLET | ORAL | 3 refills | Status: DC

## 2019-08-06 ENCOUNTER — Ambulatory Visit: Payer: Medicare Other

## 2019-08-06 ENCOUNTER — Other Ambulatory Visit: Payer: Medicare Other

## 2019-08-15 ENCOUNTER — Ambulatory Visit: Payer: Medicare Other

## 2019-09-04 ENCOUNTER — Encounter: Payer: Self-pay | Admitting: Internal Medicine

## 2019-09-04 ENCOUNTER — Other Ambulatory Visit: Payer: Self-pay

## 2019-09-04 ENCOUNTER — Ambulatory Visit (INDEPENDENT_AMBULATORY_CARE_PROVIDER_SITE_OTHER): Payer: Medicare Other | Admitting: Internal Medicine

## 2019-09-04 DIAGNOSIS — R059 Cough, unspecified: Secondary | ICD-10-CM

## 2019-09-04 DIAGNOSIS — R05 Cough: Secondary | ICD-10-CM

## 2019-09-04 DIAGNOSIS — J3089 Other allergic rhinitis: Secondary | ICD-10-CM

## 2019-09-04 DIAGNOSIS — H6063 Unspecified chronic otitis externa, bilateral: Secondary | ICD-10-CM

## 2019-09-04 MED ORDER — BENZONATATE 100 MG PO CAPS: 100.0000 mg | ORAL_CAPSULE | Freq: Three times a day (TID) | ORAL | 0 refills | Status: AC

## 2019-09-04 MED ORDER — NEOMYCIN-POLYMYXIN-HC 3.5-10000-1 OT SUSP: 3.0000 [drp] | Freq: Three times a day (TID) | OTIC | 0 refills | Status: DC | PRN

## 2019-09-04 NOTE — Progress Notes (Signed)
Date:  09/04/2019   Name:  Ariana Guzman   DOB:  [birth year]   MRN:  [id number]  I connected with this patient, Ariana Guzman, by telephone at the patient's home.  I verified that I am speaking with the correct person using two identifiers. This visit was conducted via telephone due to the Covid-19 outbreak from my office at [facility] in [place], [place]. I discussed the limitations, risks, security and privacy concerns of performing an evaluation and management service by telephone. I also discussed with the patient that there may be a patient responsible charge related to this service. The patient expressed understanding and agreed to proceed.  Chief Complaint: Sinusitis (X 1 week. Eyebrow pain/soreness, cough- clear production. Taking mucinex. Runny nose, and sneezing. ) and Otitis Media (Wants RF on ear drops. They help when her ears hurt. )  Sinusitis This is a recurrent problem. The problem has been gradually worsening since onset. There has been no fever. Associated symptoms include congestion, coughing (with clear phlegm), ear pain, shortness of breath (unchanged), sinus pressure and sneezing. Pertinent negatives include no chills, headaches or hoarse voice. Past treatments include spray decongestants.  Otalgia  There is pain in both ears. This is a recurrent problem. There has been no fever. The pain is mild. Associated symptoms include coughing (with clear phlegm). Pertinent negatives include no abdominal pain or headaches. She has tried ear drops for the symptoms. The treatment provided moderate relief.  Cough This is a chronic problem. The current episode started more than 1 year ago. The cough is non-productive. Associated symptoms include ear pain and shortness of breath (unchanged). Pertinent negatives include no chest pain, chills, fever, headaches or wheezing. She has tried a beta-agonist inhaler, leukotriene antagonists and OTC cough suppressant for the symptoms. The treatment  provided mild relief.    Lab Results  Component Value Date   CREATININE 0.89 05/28/2019   BUN 11 05/28/2019   NA 129 (L) 05/28/2019   K 3.8 05/28/2019   CL 86 (L) 05/28/2019   CO2 28 05/28/2019   Lab Results  Component Value Date   CHOL 174 05/28/2019   HDL 59 05/28/2019   LDLCALC 99 05/28/2019   TRIG 84 05/28/2019   CHOLHDL 2.9 05/28/2019   Lab Results  Component Value Date   TSH 1.540 05/28/2019   Lab Results  Component Value Date   HGBA1C 6.6 (H) 05/28/2019     Review of Systems  Constitutional: Negative for chills, fatigue and fever.  HENT: Positive for congestion, ear pain, sinus pressure and sneezing. Negative for hoarse voice.   Respiratory: Positive for cough (with clear phlegm) and shortness of breath (unchanged). Negative for wheezing.   Cardiovascular: Negative for chest pain, palpitations and leg swelling.  Gastrointestinal: Negative for abdominal pain.  Neurological: Negative for dizziness, light-headedness and headaches.  Psychiatric/Behavioral: Negative for dysphoric mood and sleep disturbance. The patient is not nervous/anxious.     Patient Active Problem List   Diagnosis Date Noted  . Pre-diabetes 05/29/2018  . Primary insomnia 09/15/2017  . Essential hypertension 09/09/2017  . 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 Bilateral lower extremity edema 09/09/2017  . Pain in both lower extremities 09/09/2017  . Current use of long term anticoagulation 07/28/2017  . 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 Otitis externa, chronic 12/03/2015  . Obstructive sleep apnea 10/07/2015  . Asthma, persistent controlled 09/25/2015  . Acquired hypothyroidism 02/29/2012  . Chronic atrial fibrillation (  [place]) 02/29/2012  . Hyperlipidemia 02/29/2012    Allergies  Allergen Reactions  . Tramadol Shortness Of Breath  . Meloxicam Rash    Past Surgical History:  Procedure Laterality Date  . ABDOMINAL  HYSTERECTOMY  1991   uterus only- still has cervix - done for prolapse  . COLONOSCOPY  2009   normal  . TOE SURGERY      Social History   Tobacco Use  . Smoking status: Former Smoker    Packs/day: 0.50    Years: 29.00    Pack years: 14.50    Types: Cigarettes    Quit date: 09/28/1989    Years since quitting: 29.9  . Smokeless tobacco: Never Used  . Tobacco comment: smoking cessation materials not required  Substance Use Topics  . Alcohol use: No  . Drug use: No     Medication list has been reviewed and updated.  Current Meds  Medication Sig  . albuterol (PROVENTIL) (2.5 MG/3ML) 0.083% nebulizer solution Inhale into the lungs.  . ALPRAZolam (XANAX) 0.25 MG tablet Take 1 tablet by mouth twice daily as needed for anxiety  . Ascorbic Acid (VITAMIN C) 1000 MG tablet Take 1,000 mg by mouth daily.  . Calcium Carb-Cholecalciferol [phone] MG-UNIT TABS Take by mouth.  . cholecalciferol (VITAMIN D) 1000 units tablet Take 1,000 Units by mouth daily.  Marland Kitchen docusate sodium (COLACE) 100 MG capsule Take 100 mg by mouth daily.  . fluticasone (FLONASE) 50 MCG/ACT nasal spray Place into both nostrils daily.  . Fluticasone-Salmeterol (ADVAIR) 250-50 MCG/DOSE AEPB Inhale 1 puff into the lungs 2 (two) times daily.  . hydrochlorothiazide (HYDRODIURIL) 25 MG tablet Take 1 tablet by mouth once daily  . latanoprost (XALATAN) 0.005 % ophthalmic solution 1 drop at bedtime.  Marland Kitchen levothyroxine (SYNTHROID) 75 MCG tablet TAKE 1 TABLET BY MOUTH DAILY BEFORE BREAKFAST  . losartan (COZAAR) 50 MG tablet Take 1 tablet (50 mg total) by mouth daily.  Marland Kitchen MAGNESIUM-ZINC PO Take 500 mg by mouth at bedtime as needed.   . metoprolol tartrate (LOPRESSOR) 25 MG tablet Take 1 tablet (25 mg total) by mouth 2 (two) times daily.  . montelukast (SINGULAIR) 10 MG tablet Take 1 tablet (10 mg total) by mouth at bedtime.  . multivitamin-lutein (OCUVITE-LUTEIN) CAPS capsule Take 1 capsule by mouth daily.  . Rivaroxaban (XARELTO) 15  MG TABS tablet Take 15 mg by mouth daily with supper.   . Suvorexant (BELSOMRA) 10 MG TABS Take 10 mg by mouth at bedtime as needed.    PHQ 2/9 Scores 09/04/2019 05/28/2019 05/09/2019 01/03/2019  PHQ - 2 Score 0 0 0 0  PHQ- 9 Score - - - -    BP Readings from Last 3 Encounters:  05/28/19 (!) 148/70  04/26/19 (!) 164/104  03/28/19 (!) 166/80    Physical Exam Pulmonary:     Comments: Dry cough noted during the call. Mild hoarseness noted.  No dyspnea. Neurological:     Mental Status: She is alert.  Psychiatric:        Attention and Perception: Attention normal.        Mood and Affect: Mood normal.        Speech: Speech normal.        Cognition and Memory: Cognition normal.     Wt Readings from Last 3 Encounters:  05/28/19 205 lb (93 kg)  05/09/19 200 lb (90.7 kg)  04/26/19 198 lb (89.8 kg)    There were no vitals taken for this visit.  Assessment and Plan:  1. 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  Dispense: 10 mL; Refill: 0  2. Cough This has been ongoing.  She has seen Pulmonary.  She is on singulair, albuterol, zyrtec and mucinex She still has disrupted sleep - benzonatate (TESSALON) 100 MG capsule; Take 1 capsule (100 mg total) by mouth 3 (three) times daily for 10 days.  Dispense: 60 capsule; Refill: 0  3. Environmental and seasonal allergies I do not think that she has an active sinus infection at this time. Continue current regimen - flonase, zyrtec, singular and nasal spray Call if nasal discharge is yellow/green for an antibiotic   Partially dictated using Editor, commissioning. Any errors are unintentional.  Halina Maidens, MD [facility]  09/04/2019

## 2019-09-11 ENCOUNTER — Telehealth: Payer: Self-pay

## 2019-09-11 ENCOUNTER — Other Ambulatory Visit: Payer: Self-pay | Admitting: Internal Medicine

## 2019-09-11 DIAGNOSIS — J0110 Acute frontal sinusitis, unspecified: Secondary | ICD-10-CM

## 2019-09-11 MED ORDER — DOXYCYCLINE HYCLATE 100 MG PO TABS: 100.0000 mg | ORAL_TABLET | Freq: Two times a day (BID) | ORAL | 0 refills | Status: AC

## 2019-09-11 NOTE — Telephone Encounter (Signed)
Patient called saying she now is coughing up Yellow mucous and needs abx.

## 2019-09-11 NOTE — Telephone Encounter (Signed)
Pt informed

## 2019-09-11 NOTE — Telephone Encounter (Signed)
I sent in antibiotics to [facility].

## 2019-09-27 ENCOUNTER — Other Ambulatory Visit: Payer: Self-pay

## 2019-09-27 ENCOUNTER — Ambulatory Visit (INDEPENDENT_AMBULATORY_CARE_PROVIDER_SITE_OTHER): Payer: Medicare Other | Admitting: Internal Medicine

## 2019-09-27 ENCOUNTER — Encounter: Payer: Self-pay | Admitting: Internal Medicine

## 2019-09-27 VITALS — BP 136/74 | HR 77 | Temp 98.2°F | Ht 67.0 in | Wt 197.0 lb

## 2019-09-27 DIAGNOSIS — R7303 Prediabetes: Secondary | ICD-10-CM

## 2019-09-27 DIAGNOSIS — I10 Essential (primary) hypertension: Secondary | ICD-10-CM | POA: Diagnosis not present

## 2019-09-27 DIAGNOSIS — I482 Chronic atrial fibrillation, unspecified: Secondary | ICD-10-CM

## 2019-09-27 DIAGNOSIS — R0982 Postnasal drip: Secondary | ICD-10-CM | POA: Diagnosis not present

## 2019-09-27 NOTE — Progress Notes (Signed)
Date:  09/27/2019   Name:  Ariana Guzman   DOB:  1941-02-11   MRN:  [id number]   Chief Complaint: Hypertension (4 month follow up.) and Diabetes  Hypertension This is a chronic problem. The problem is controlled. Associated symptoms include shortness of breath. Pertinent negatives include no chest pain, headaches or palpitations. Past treatments include angiotensin blockers, beta blockers and diuretics. The current treatment provides significant improvement. There are no compliance problems.   Diabetes She presents for her follow-up diabetic visit. She has type 2 (just noted last visit A1C 6.6) diabetes mellitus. Pertinent negatives for hypoglycemia include no dizziness or headaches. Pertinent negatives for diabetes include no chest pain and no fatigue. Current diabetic treatment includes diet (and she has started exercising).  She has been walking for total of one hour per day around her house.    Lab Results  Component Value Date   CREATININE 0.89 05/28/2019   BUN 11 05/28/2019   NA 129 (L) 05/28/2019   K 3.8 05/28/2019   CL 86 (L) 05/28/2019   CO2 28 05/28/2019   Lab Results  Component Value Date   CHOL 174 05/28/2019   HDL 59 05/28/2019   LDLCALC 99 05/28/2019   TRIG 84 05/28/2019   CHOLHDL 2.9 05/28/2019   Lab Results  Component Value Date   TSH 1.540 05/28/2019   Lab Results  Component Value Date   HGBA1C 6.6 (H) 05/28/2019     Review of Systems  Constitutional: Positive for unexpected weight change (has lost about 7 lbs). Negative for chills, fatigue and fever.  HENT: Positive for congestion and postnasal drip. Negative for sore throat.   Respiratory: Positive for cough (intermittent dry cough due to PND) and shortness of breath. Negative for chest tightness and wheezing.   Cardiovascular: Negative for chest pain, palpitations and leg swelling.  Gastrointestinal: Negative for abdominal pain.  Musculoskeletal: Positive for arthralgias (knees).  Neurological:  Negative for dizziness, syncope and headaches.  Psychiatric/Behavioral: Negative for dysphoric mood and sleep disturbance.    Patient Active Problem List   Diagnosis Date Noted  . Pre-diabetes 05/29/2018  . Primary insomnia 09/15/2017  . Essential hypertension 09/09/2017  . 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 Bilateral lower extremity edema 09/09/2017  . Pain in both lower extremities 09/09/2017  . Current use of long term anticoagulation 07/28/2017  . 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 Otitis externa, chronic 12/03/2015  . Obstructive sleep apnea 10/07/2015  . Asthma, persistent controlled 09/25/2015  . Acquired hypothyroidism 02/29/2012  . Chronic atrial fibrillation ([place]) 02/29/2012  . Hyperlipidemia 02/29/2012    Allergies  Allergen Reactions  . Tramadol Shortness Of Breath  . Meloxicam Rash    Past Surgical History:  Procedure Laterality Date  . ABDOMINAL HYSTERECTOMY  1991   uterus only- still has cervix - done for prolapse  . COLONOSCOPY  2009   normal  . TOE SURGERY      Social History   Tobacco Use  . Smoking status: Former Smoker    Packs/day: 0.50    Years: 29.00    Pack years: 14.50    Types: Cigarettes    Quit date: 09/28/1989    Years since quitting: 30.0  . Smokeless tobacco: Never Used  . Tobacco comment: smoking cessation materials not required  Substance Use Topics  . Alcohol use: No  . Drug use: No     Medication list has been reviewed and updated.  Current  Meds  Medication Sig  . ALPRAZolam (XANAX) 0.25 MG tablet Take 1 tablet by mouth twice daily as needed for anxiety (Patient taking differently: Take 0.25 mg by mouth at bedtime. )  . Ascorbic Acid (VITAMIN C) 1000 MG tablet Take 1,000 mg by mouth daily.  . Calcium Carb-Cholecalciferol [phone] MG-UNIT TABS Take by mouth.  . cetirizine (ZYRTEC) 10 MG tablet Take 10 mg by mouth daily.  . cholecalciferol (VITAMIN D) 1000 units  tablet Take 1,000 Units by mouth daily.  Marland Kitchen dextromethorphan-guaiFENesin (MUCINEX DM) 30-600 MG 12hr tablet Take 1 tablet by mouth 2 (two) times daily as needed for cough.  . docusate sodium (COLACE) 100 MG capsule Take 100 mg by mouth daily.  . Fluticasone-Salmeterol (ADVAIR) 250-50 MCG/DOSE AEPB Inhale 1 puff into the lungs 2 (two) times daily.  . hydrochlorothiazide (HYDRODIURIL) 25 MG tablet Take 1 tablet by mouth once daily  . levothyroxine (SYNTHROID) 75 MCG tablet TAKE 1 TABLET BY MOUTH DAILY BEFORE BREAKFAST  . losartan (COZAAR) 50 MG tablet Take 1 tablet (50 mg total) by mouth daily.  Marland Kitchen MAGNESIUM-ZINC PO Take 500 mg by mouth at bedtime as needed.   . metoprolol tartrate (LOPRESSOR) 25 MG tablet Take 1 tablet (25 mg total) by mouth 2 (two) times daily.  . multivitamin-lutein (OCUVITE-LUTEIN) CAPS capsule Take 1 capsule by mouth daily.  Marland Kitchen neomycin-polymyxin-hydrocortisone (CORTISPORIN) 3.5-10000-1 OTIC suspension Place 3 drops into both ears 3 (three) times daily as needed.  . Rivaroxaban (XARELTO) 15 MG TABS tablet Take 15 mg by mouth daily with supper.     PHQ 2/9 Scores 09/27/2019 09/04/2019 05/28/2019 05/09/2019  PHQ - 2 Score 0 0 0 0  PHQ- 9 Score - - - -    BP Readings from Last 3 Encounters:  09/27/19 136/74  05/28/19 (!) 148/70  04/26/19 (!) 164/104    Physical Exam Vitals and nursing note reviewed.  Constitutional:      General: She is not in acute distress.    Appearance: She is well-developed.  HENT:     Head: Normocephalic and atraumatic.  Neck:     Thyroid: No thyroid tenderness.  Cardiovascular:     Rate and Rhythm: Normal rate. Rhythm regularly irregular.     Pulses: Normal pulses.     Heart sounds: No murmur.  Pulmonary:     Effort: Pulmonary effort is normal. No respiratory distress.     Breath sounds: Normal breath sounds.  Lymphadenopathy:     Cervical: No cervical adenopathy.  Skin:    General: Skin is warm and dry.     Findings: No rash.    Neurological:     Mental Status: She is alert and oriented to person, place, and time.  Psychiatric:        Attention and Perception: Attention normal.        Behavior: Behavior normal.        Thought Content: Thought content normal.     Wt Readings from Last 3 Encounters:  09/27/19 197 lb (89.4 kg)  05/28/19 205 lb (93 kg)  05/09/19 200 lb (90.7 kg)    BP 136/74   Pulse 77   Temp 98.2 F (36.8 C) (Oral)   Ht 5\' 7"  (1.702 m)   Wt 197 lb (89.4 kg)   SpO2 97%   BMI 30.85 kg/m   Assessment and Plan: 1. Essential hypertension Clinically stable exam with well controlled BP on metoprolol, losartan and hctz. Tolerating medications without side effects at this time. Pt to continue current  regimen and low sodium diet; benefits of regular exercise as able discussed. - Basic metabolic panel  2. Pre-diabetes Last A1C was 6.6 - pt is determined not have DM and has been working hard on diet and exercise.  She had been given several prolonged steroid tapers that probably contributed as well.  She is congratulated on the success and weight loss Will check labs again today - Hemoglobin A1c  3. Chronic atrial fibrillation (HCC) Rate controlled.  No bleeding issues on Xarelto. Continue current therapy and cardiology follow up.  4. Post-nasal drainage Pt has failed allegra, claritin and zyrtec. Samples of Xyzal given.   Partially dictated using Editor, commissioning. Any errors are unintentional.  Halina Maidens, MD [facility]  09/27/2019

## 2019-09-28 LAB — HEMOGLOBIN A1C
Est. average glucose Bld gHb Est-mCnc: 140 mg/dL
Hgb A1c MFr Bld: 6.5 % — ABNORMAL HIGH (ref 4.8–5.6)

## 2019-09-28 LAB — BASIC METABOLIC PANEL
BUN/Creatinine Ratio: 17 (ref 12–28)
BUN: 13 mg/dL (ref 8–27)
CO2: 26 mmol/L (ref 20–29)
Calcium: 9.9 mg/dL (ref 8.7–10.3)
Chloride: 89 mmol/L — ABNORMAL LOW (ref 96–106)
Creatinine, Ser: 0.75 mg/dL (ref 0.57–1.00)
GFR calc Af Amer: 87 mL/min/{1.73_m2} (ref >59)
GFR calc non Af Amer: 76 mL/min/{1.73_m2} (ref >59)
Glucose: 109 mg/dL — ABNORMAL HIGH (ref 65–99)
Potassium: 4 mmol/L (ref 3.5–5.2)
Sodium: 131 mmol/L — ABNORMAL LOW (ref 134–144)

## 2019-10-01 IMAGING — CR DG HAND COMPLETE 3+V*L*
3 series · 3 of 3 positions shown · non-contrast
Comparison: None.

CLINICAL DATA: 76 y/o  F; left hand and wrist pain.

EXAM:
LEFT WRIST - COMPLETE 3+ VIEW; LEFT HAND - COMPLETE 3+ VIEW

[hand ap]
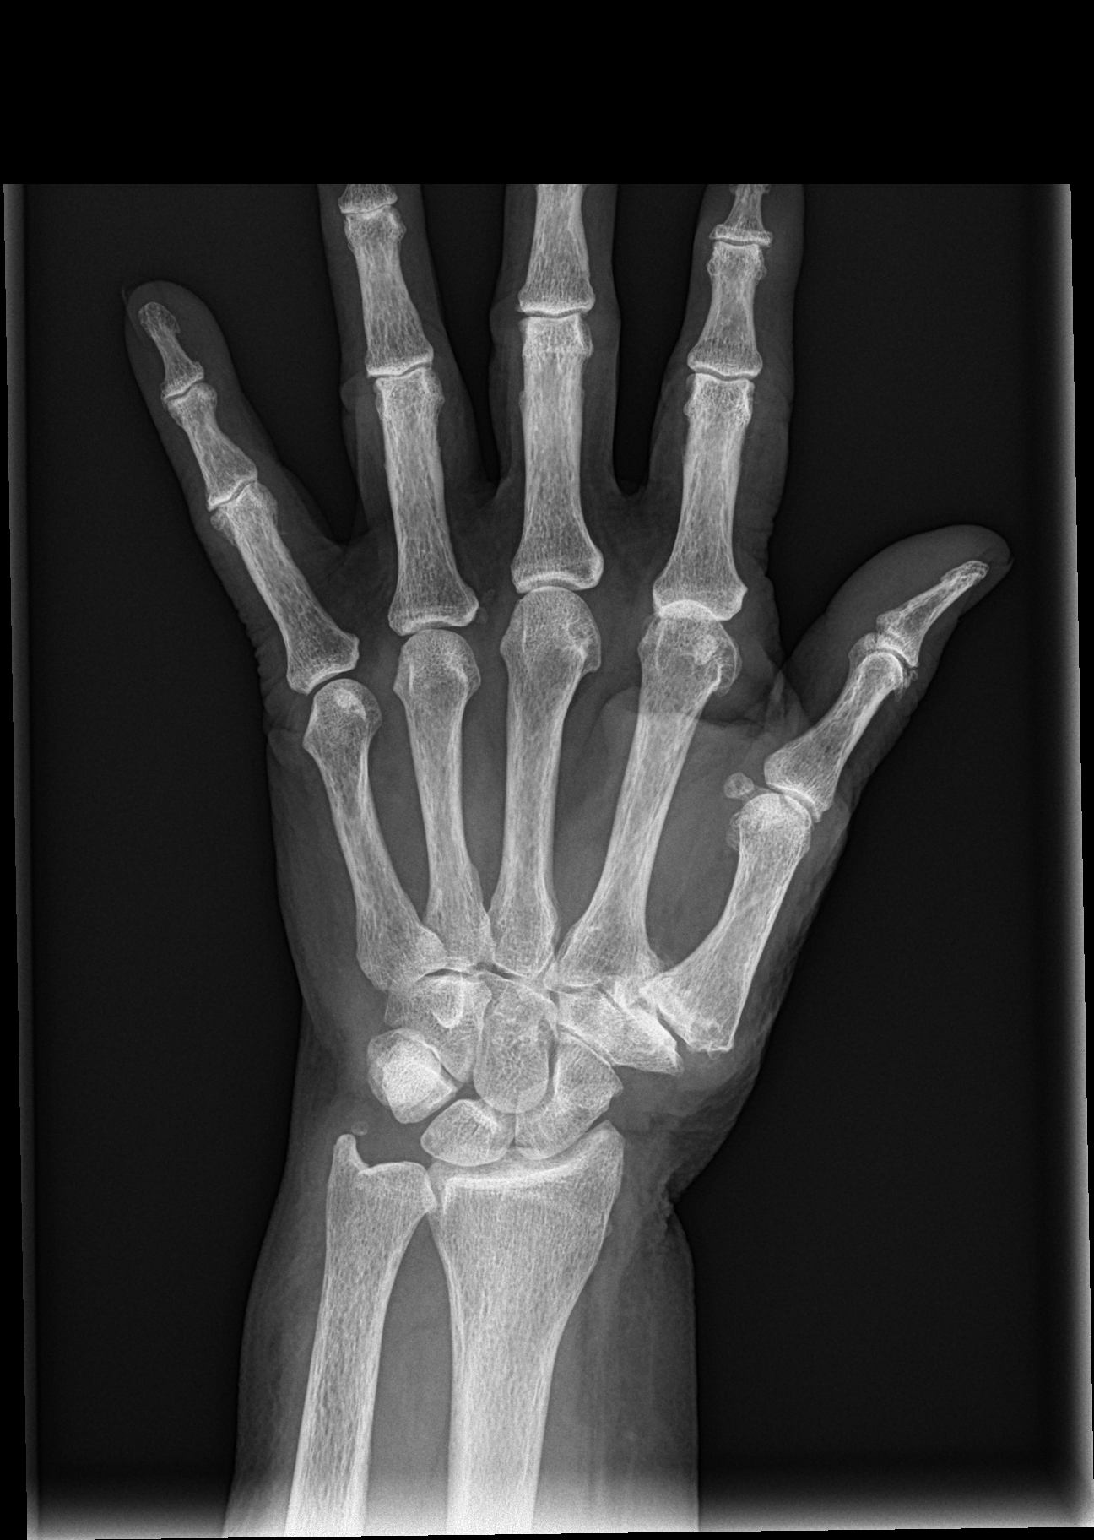

[hand obl]
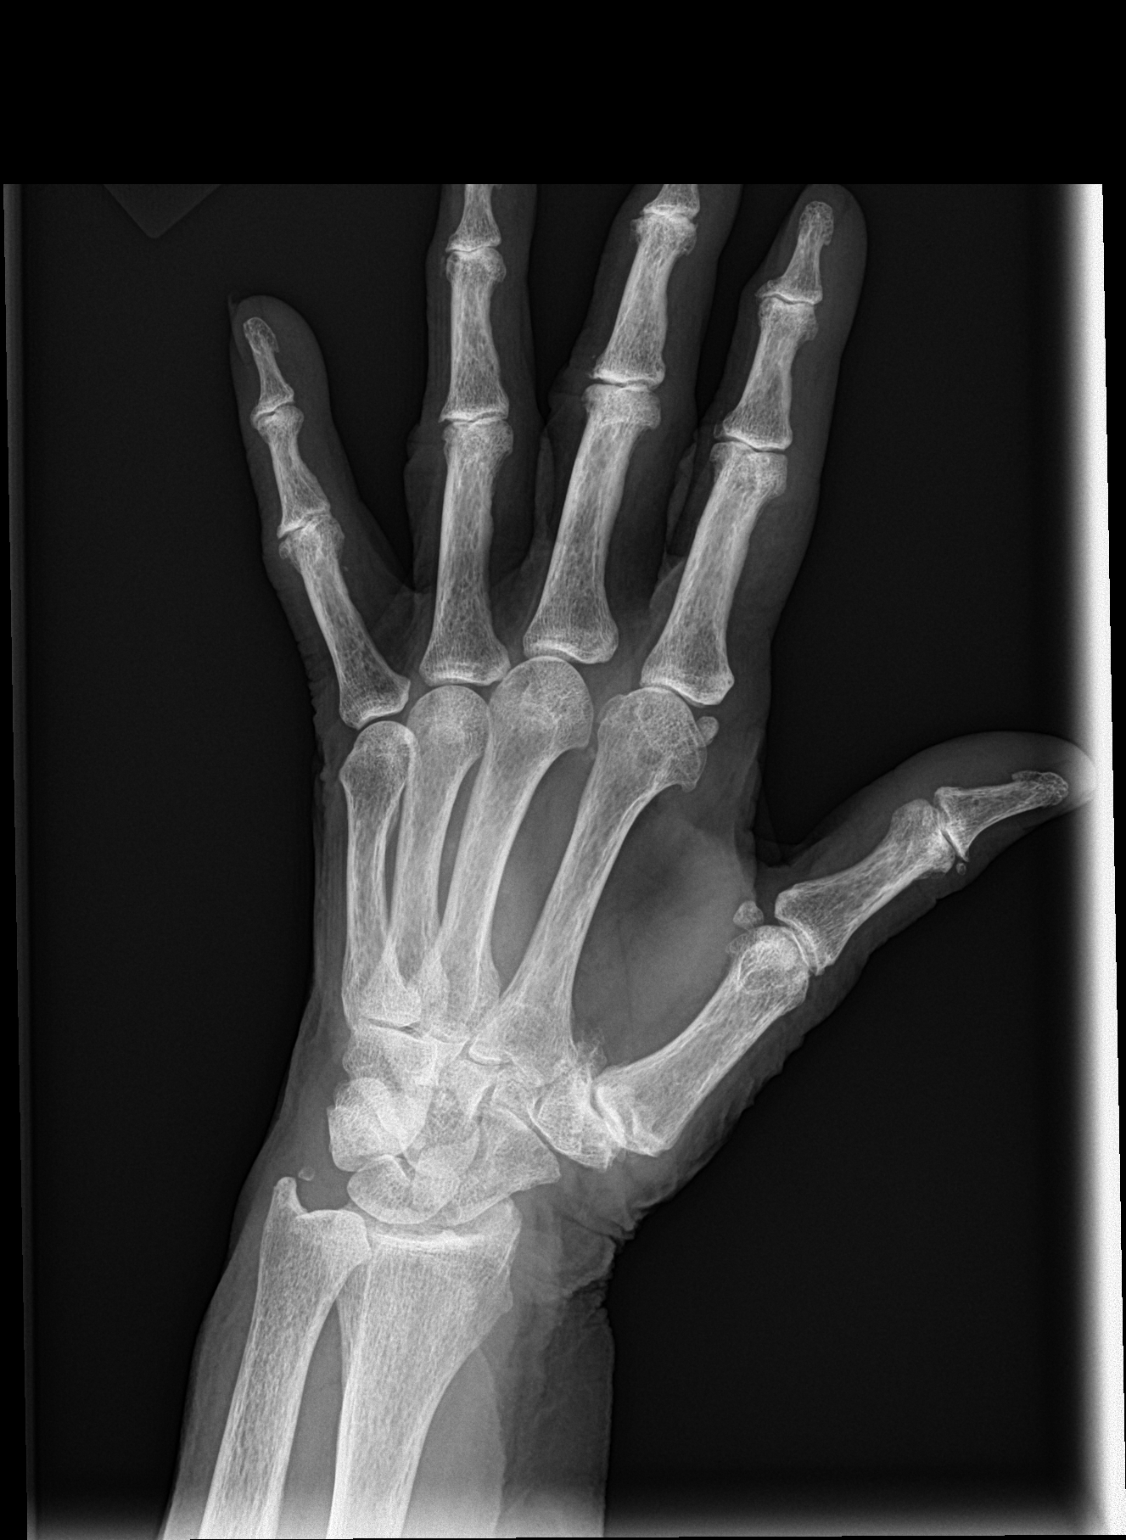

[hand lat]
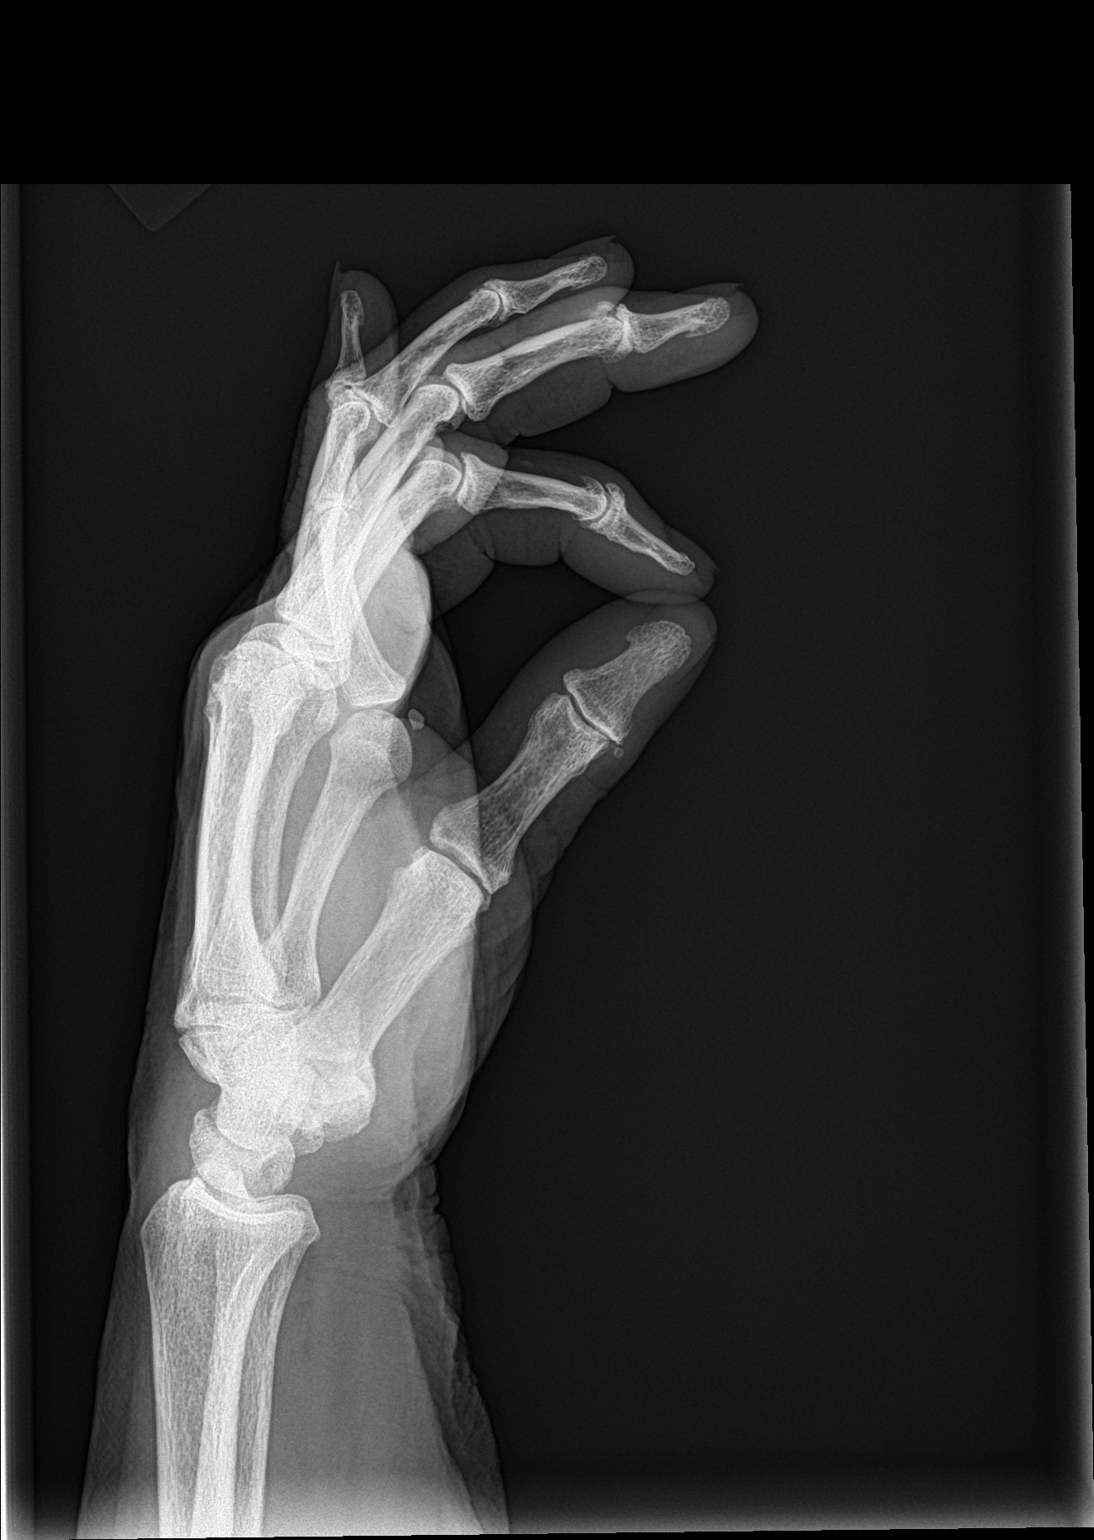

[3 of 3 positions shown; findings below may reference images not displayed]

FINDINGS: Left wrist:

There is no evidence of acute fracture or dislocation. Tiny chronic
fracture of the ulnar styloid. There is no evidence of arthropathy
or other focal bone abnormality. Soft tissues are unremarkable.

Left hand:

Mild osteoarthrosis of distal interphalangeal joints with loss of
joint space and small periarticular osteophytes. Moderate
osteoarthrosis of the basal joint with sclerosis of articular
surfaces. Bones are demineralized. No acute fracture or dislocation.
Normal radiocarpal alignment. Tiny chronic fracture of the ulnar
styloid. No erosive changes.
IMPRESSION: 1. No acute fracture or dislocation.
2. Tiny chronic fracture of the ulnar styloid.
3. Mild osteoarthrosis of distal interphalangeal joints and moderate
osteoarthrosis of the basal joint.

By: Chris-Jan Brader M.D.

## 2019-10-04 DIAGNOSIS — J45998 Other asthma: Secondary | ICD-10-CM | POA: Diagnosis not present

## 2019-10-04 DIAGNOSIS — J4551 Severe persistent asthma with (acute) exacerbation: Secondary | ICD-10-CM | POA: Diagnosis not present

## 2019-10-16 DIAGNOSIS — I482 Chronic atrial fibrillation, unspecified: Secondary | ICD-10-CM | POA: Diagnosis not present

## 2019-10-18 DIAGNOSIS — H6060 Unspecified chronic otitis externa, unspecified ear: Secondary | ICD-10-CM | POA: Diagnosis not present

## 2019-10-18 DIAGNOSIS — H6122 Impacted cerumen, left ear: Secondary | ICD-10-CM | POA: Diagnosis not present

## 2019-10-18 DIAGNOSIS — J301 Allergic rhinitis due to pollen: Secondary | ICD-10-CM | POA: Diagnosis not present

## 2019-10-18 DIAGNOSIS — H903 Sensorineural hearing loss, bilateral: Secondary | ICD-10-CM | POA: Diagnosis not present

## 2019-10-26 DIAGNOSIS — I482 Chronic atrial fibrillation, unspecified: Secondary | ICD-10-CM | POA: Diagnosis not present

## 2019-10-26 DIAGNOSIS — I10 Essential (primary) hypertension: Secondary | ICD-10-CM | POA: Diagnosis not present

## 2019-10-26 DIAGNOSIS — G4733 Obstructive sleep apnea (adult) (pediatric): Secondary | ICD-10-CM | POA: Diagnosis not present

## 2019-11-01 ENCOUNTER — Other Ambulatory Visit: Payer: Self-pay | Admitting: Internal Medicine

## 2019-11-01 DIAGNOSIS — F5101 Primary insomnia: Secondary | ICD-10-CM

## 2019-11-15 DIAGNOSIS — L84 Corns and callosities: Secondary | ICD-10-CM | POA: Diagnosis not present

## 2019-11-15 DIAGNOSIS — L851 Acquired keratosis [keratoderma] palmaris et plantaris: Secondary | ICD-10-CM | POA: Diagnosis not present

## 2019-11-15 DIAGNOSIS — I739 Peripheral vascular disease, unspecified: Secondary | ICD-10-CM | POA: Diagnosis not present

## 2019-11-15 DIAGNOSIS — I83893 Varicose veins of bilateral lower extremities with other complications: Secondary | ICD-10-CM | POA: Diagnosis not present

## 2019-11-15 DIAGNOSIS — M7741 Metatarsalgia, right foot: Secondary | ICD-10-CM | POA: Diagnosis not present

## 2019-11-19 ENCOUNTER — Ambulatory Visit: Payer: Medicare Other | Admitting: Internal Medicine

## 2019-11-20 ENCOUNTER — Telehealth: Payer: Self-pay

## 2019-11-20 NOTE — Telephone Encounter (Signed)
Patient called stating her son tested POS for Covid. She said she has not been around her son in 2 weeks but she has been around his wife who rides in the car with her daily. Told her she needs to monitor herself for Covid symptoms and if she develops them to let me know and we can do a VV and send her for testing. She said she would prefer to go to UC if she develops symptoms. Told her this is fine just to call them first to let them know of exposure.  She verbalized understanding of this.  CM

## 2019-11-27 DIAGNOSIS — G4733 Obstructive sleep apnea (adult) (pediatric): Secondary | ICD-10-CM | POA: Diagnosis not present

## 2019-11-30 ENCOUNTER — Other Ambulatory Visit: Payer: Self-pay | Admitting: Internal Medicine

## 2019-11-30 DIAGNOSIS — J45998 Other asthma: Secondary | ICD-10-CM

## 2019-11-30 DIAGNOSIS — H6063 Unspecified chronic otitis externa, bilateral: Secondary | ICD-10-CM

## 2020-01-01 DIAGNOSIS — H2513 Age-related nuclear cataract, bilateral: Secondary | ICD-10-CM | POA: Diagnosis not present

## 2020-02-14 DIAGNOSIS — L84 Corns and callosities: Secondary | ICD-10-CM | POA: Diagnosis not present

## 2020-02-14 DIAGNOSIS — Q828 Other specified congenital malformations of skin: Secondary | ICD-10-CM | POA: Diagnosis not present

## 2020-02-14 DIAGNOSIS — D2372 Other benign neoplasm of skin of left lower limb, including hip: Secondary | ICD-10-CM | POA: Diagnosis not present

## 2020-02-14 DIAGNOSIS — D2371 Other benign neoplasm of skin of right lower limb, including hip: Secondary | ICD-10-CM | POA: Diagnosis not present

## 2020-02-14 DIAGNOSIS — M7742 Metatarsalgia, left foot: Secondary | ICD-10-CM | POA: Diagnosis not present

## 2020-02-14 DIAGNOSIS — M7741 Metatarsalgia, right foot: Secondary | ICD-10-CM | POA: Diagnosis not present

## 2020-02-28 DIAGNOSIS — M7742 Metatarsalgia, left foot: Secondary | ICD-10-CM | POA: Diagnosis not present

## 2020-02-28 DIAGNOSIS — Q828 Other specified congenital malformations of skin: Secondary | ICD-10-CM | POA: Diagnosis not present

## 2020-02-28 DIAGNOSIS — D2371 Other benign neoplasm of skin of right lower limb, including hip: Secondary | ICD-10-CM | POA: Diagnosis not present

## 2020-02-28 DIAGNOSIS — M7741 Metatarsalgia, right foot: Secondary | ICD-10-CM | POA: Diagnosis not present

## 2020-02-28 DIAGNOSIS — L84 Corns and callosities: Secondary | ICD-10-CM | POA: Diagnosis not present

## 2020-03-13 ENCOUNTER — Ambulatory Visit
Admission: EM | Admit: 2020-03-13 | Discharge: 2020-03-13 | Disposition: A | Discharge status: Home / Self Care | Payer: Medicare Other | Attending: Family Medicine | Admitting: Family Medicine

## 2020-03-13 DIAGNOSIS — L03115 Cellulitis of right lower limb: Secondary | ICD-10-CM

## 2020-03-13 MED ORDER — DOXYCYCLINE HYCLATE 100 MG PO CAPS: 100.0000 mg | ORAL_CAPSULE | Freq: Two times a day (BID) | ORAL | 0 refills | Status: DC

## 2020-03-13 MED ORDER — MUPIROCIN 2 % EX OINT: 1.0000 "application " | TOPICAL_OINTMENT | Freq: Two times a day (BID) | CUTANEOUS | 0 refills | Status: AC

## 2020-03-13 NOTE — ED Triage Notes (Signed)
Pt reports having pain and redness to R ankle x1 week. Pt has bandage and neosporin cream on ankle. No open wound noted to ankle in triage.

## 2020-03-13 NOTE — Discharge Instructions (Signed)
Medications as prescribed.  Please see Vein and Vascular.  Take care  Dr. Lacinda Axon

## 2020-03-13 NOTE — ED Provider Notes (Signed)
[facility]    CSN: [id number] Arrival date & time: 03/13/20  1141      History   Chief Complaint Chief Complaint  Patient presents with  . 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  Patient has had prior procedures regarding varicose veins.  She reports a 1 week history of an area of erythema above her right ankle.  He has been warm and has been painful.  Described as achy/burning.  4/10 in severity.  She has been applying topical Neosporin without resolution.  No fever.  No relieving factors.  No other associated symptoms.  No other complaints.  Past Medical History:  Diagnosis Date  . Asthma   . Atrial fibrillation ([place])   . Hypertension   . Peripheral arterial disease [facility])     Patient Active Problem List   Diagnosis Date Noted  . Pre-diabetes 05/29/2018  . Primary insomnia 09/15/2017  . Essential hypertension 09/09/2017  . 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 Bilateral lower extremity edema 09/09/2017  . Pain in both lower extremities 09/09/2017  . Current use of long term anticoagulation 07/28/2017  . 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 Otitis externa, chronic 12/03/2015  . Obstructive sleep apnea 10/07/2015  . Asthma, persistent controlled 09/25/2015  . Acquired hypothyroidism 02/29/2012  . Chronic atrial fibrillation ([place]) 02/29/2012  . Hyperlipidemia 02/29/2012    Past Surgical History:  Procedure Laterality Date  . ABDOMINAL HYSTERECTOMY  1991   uterus only- still has cervix - done for prolapse  . COLONOSCOPY  2009   normal  . TOE SURGERY      OB History   No obstetric history on file.      Home Medications    Prior to Admission medications   Medication Sig Start Date End Date Taking? Authorizing Provider  ALPRAZolam (XANAX) 0.25 MG tablet Take 1 tablet (0.25 mg total) by mouth  at bedtime. 11/01/19   Glean Hess, MD  Ascorbic Acid (VITAMIN C) 1000 MG tablet Take 1,000 mg by mouth daily.    [provider]  Calcium Carb-Cholecalciferol [phone] MG-UNIT TABS Take by mouth.    [provider]  cetirizine (ZYRTEC) 10 MG tablet Take 10 mg by mouth daily.    [provider]  cholecalciferol (VITAMIN D) 1000 units tablet Take 1,000 Units by mouth daily.    [provider]  dextromethorphan-guaiFENesin (MUCINEX DM) 30-600 MG 12hr tablet Take 1 tablet by mouth 2 (two) times daily as needed for cough.    [provider]  docusate sodium (COLACE) 100 MG capsule Take 100 mg by mouth daily.    [provider]  doxycycline (VIBRAMYCIN) 100 MG capsule Take 1 capsule (100 mg total) by mouth 2 (two) times daily. 03/13/20   Coral Spikes, DO  Fluticasone-Salmeterol (ADVAIR) 250-50 MCG/DOSE AEPB Inhale 1 puff into the lungs 2 (two) times daily.    [provider]  hydrochlorothiazide (HYDRODIURIL) 25 MG tablet Take 1 tablet by mouth once daily 07/14/19   Glean Hess, MD  levothyroxine (SYNTHROID) 75 MCG tablet TAKE 1 TABLET BY MOUTH DAILY BEFORE BREAKFAST 07/16/19   Glean Hess, MD  losartan (COZAAR) 50 MG tablet Take 1 tablet (50 mg total) by mouth daily. 03/31/19   Glean Hess, MD  MAGNESIUM-ZINC PO Take 500 mg by mouth at bedtime as needed.  [provider]  metoprolol tartrate (LOPRESSOR) 25 MG tablet Take 1 tablet (25 mg total) by mouth 2 (two) times daily. 07/16/19   Glean Hess, MD  montelukast (SINGULAIR) 10 MG tablet TAKE 1 TABLET BY MOUTH AT BEDTIME 11/30/19   Glean Hess, MD  multivitamin-lutein [facility]) CAPS capsule Take 1 capsule by mouth daily.    [provider]  mupirocin ointment (BACTROBAN) 2 % Apply 1 application topically 2 (two) times daily for 7 days. 03/13/20 03/20/20  Coral Spikes, DO  neomycin-polymyxin-hydrocortisone (CORTISPORIN) 3.5-10000-1  OTIC suspension INSTILL 3 DROPS INTO EACH EAR THREE TIMES DAILY AS NEEDED 11/30/19   Glean Hess, MD  Rivaroxaban (XARELTO) 15 MG TABS tablet Take 15 mg by mouth daily with supper.     [provider]  fexofenadine (ALLEGRA) 180 MG tablet Take 180 mg by mouth daily.  03/28/19  [provider]    Family History Family History  Problem Relation Age of Onset  . Breast cancer Other 56  . Dementia Mother   . Cancer Father        prostate and bone  . Heart attack Sister     Social History Social History   Tobacco Use  . Smoking status: Former Smoker    Packs/day: 0.50    Years: 29.00    Pack years: 14.50    Types: Cigarettes    Quit date: 09/28/1989    Years since quitting: 30.4  . Smokeless tobacco: Never Used  . Tobacco comment: smoking cessation materials not required  Vaping Use  . Vaping Use: Never used  Substance Use Topics  . Alcohol use: No  . Drug use: No     Allergies   Tramadol and Meloxicam   Review of Systems Review of Systems  Constitutional: Negative for fever.  Skin:       Area of redness, pain (right lower extremity).   Physical Exam Triage Vital Signs ED Triage Vitals  Enc Vitals Group     BP 03/13/20 1147 (!) 185/78     Pulse Rate 03/13/20 1147 77     Resp 03/13/20 1147 18     Temp 03/13/20 1147 97.8 F (36.6 C)     Temp Source 03/13/20 1147 Oral     SpO2 03/13/20 1147 97 %     Weight 03/13/20 1148 197 lb 1.5 oz (89.4 kg)     Height 03/13/20 1148 5\' 7"  (1.702 m)     Head Circumference --      Peak Flow --      Pain Score 03/13/20 1148 4     Pain Loc --      Pain Edu? --      Excl. in [place]? --    Updated Vital Signs BP (!) 185/78   Pulse 77   Temp 97.8 F (36.6 C) (Oral)   Resp 18   Ht 5\' 7"  (1.702 m)   Wt 89.4 kg   SpO2 97%   BMI 30.87 kg/m   Visual Acuity Right Eye Distance:   Left Eye Distance:   Bilateral Distance:    Right Eye Near:   Left Eye Near:    Bilateral Near:     Physical Exam Vitals  and nursing note reviewed.  Constitutional:      General: She is not in acute distress.    Appearance: Normal appearance. She is not ill-appearing.  HENT:     Head: Normocephalic and atraumatic.  Eyes:     General:  Right eye: No discharge.        Left eye: No discharge.     Conjunctiva/sclera: Conjunctivae normal.  Pulmonary:     Effort: Pulmonary effort is normal. No respiratory distress.  Skin:    Comments: Right lower extremity with small area of erythema.  Tender to palpation.  No drainage.  Neurological:     Mental Status: She is alert.  Psychiatric:        Mood and Affect: Mood normal.        Behavior: Behavior normal.    UC Treatments / Results  Labs (all labs ordered are listed, but only abnormal results are displayed) Labs Reviewed - No data to display  EKG   Radiology No results found.  Procedures Procedures (including critical care time)  Medications Ordered in UC Medications - No data to display  Initial Impression / Assessment and Plan / UC Course  I have reviewed the triage vital signs and the nursing notes.  Pertinent labs & imaging results that were available during my care of the patient were reviewed by me and considered in my medical decision making (see chart for details).    79 year old female presents with cellulitis of right lower extremity.  Treated with doxycycline and Bactroban ointment.  Advised follow-up with vein and vascular.  Final Clinical Impressions(s) / UC Diagnoses   Final diagnoses:  Cellulitis of right lower extremity     Discharge Instructions     Medications as prescribed.  Please see Vein and Vascular.  Take care  Dr. Lacinda Axon    ED Prescriptions    Medication Sig Dispense Auth. Provider   doxycycline (VIBRAMYCIN) 100 MG capsule Take 1 capsule (100 mg total) by mouth 2 (two) times daily. 14 capsule Yulisa Chirico G, DO   mupirocin ointment (BACTROBAN) 2 % Apply 1 application topically 2 (two) times daily for  7 days. 22 g Coral Spikes, DO     PDMP not reviewed this encounter.   Coral Spikes, DO 03/13/20 1245

## 2020-03-20 DIAGNOSIS — D2371 Other benign neoplasm of skin of right lower limb, including hip: Secondary | ICD-10-CM | POA: Diagnosis not present

## 2020-03-20 DIAGNOSIS — Q828 Other specified congenital malformations of skin: Secondary | ICD-10-CM | POA: Diagnosis not present

## 2020-03-20 DIAGNOSIS — D2372 Other benign neoplasm of skin of left lower limb, including hip: Secondary | ICD-10-CM | POA: Diagnosis not present

## 2020-03-20 DIAGNOSIS — M79671 Pain in right foot: Secondary | ICD-10-CM | POA: Diagnosis not present

## 2020-03-20 DIAGNOSIS — E039 Hypothyroidism, unspecified: Secondary | ICD-10-CM | POA: Diagnosis not present

## 2020-03-20 DIAGNOSIS — G8929 Other chronic pain: Secondary | ICD-10-CM | POA: Diagnosis not present

## 2020-03-20 DIAGNOSIS — M7741 Metatarsalgia, right foot: Secondary | ICD-10-CM | POA: Diagnosis not present

## 2020-03-20 DIAGNOSIS — L851 Acquired keratosis [keratoderma] palmaris et plantaris: Secondary | ICD-10-CM | POA: Diagnosis not present

## 2020-03-23 ENCOUNTER — Encounter: Payer: Self-pay | Admitting: Emergency Medicine

## 2020-03-23 ENCOUNTER — Other Ambulatory Visit: Payer: Self-pay

## 2020-03-23 ENCOUNTER — Ambulatory Visit
Admission: EM | Admit: 2020-03-23 | Discharge: 2020-03-23 | Disposition: A | Discharge status: Home / Self Care | Payer: Medicare Other | Attending: Family Medicine | Admitting: Family Medicine

## 2020-03-23 DIAGNOSIS — I8311 Varicose veins of right lower extremity with inflammation: Secondary | ICD-10-CM | POA: Diagnosis not present

## 2020-03-23 DIAGNOSIS — J45909 Unspecified asthma, uncomplicated: Secondary | ICD-10-CM | POA: Diagnosis not present

## 2020-03-23 DIAGNOSIS — Z7901 Long term (current) use of anticoagulants: Secondary | ICD-10-CM | POA: Diagnosis not present

## 2020-03-23 DIAGNOSIS — E785 Hyperlipidemia, unspecified: Secondary | ICD-10-CM | POA: Diagnosis not present

## 2020-03-23 DIAGNOSIS — M79604 Pain in right leg: Secondary | ICD-10-CM | POA: Diagnosis not present

## 2020-03-23 DIAGNOSIS — R079 Chest pain, unspecified: Secondary | ICD-10-CM | POA: Diagnosis not present

## 2020-03-23 DIAGNOSIS — Z87891 Personal history of nicotine dependence: Secondary | ICD-10-CM | POA: Diagnosis not present

## 2020-03-23 DIAGNOSIS — Z888 Allergy status to other drugs, medicaments and biological substances status: Secondary | ICD-10-CM | POA: Diagnosis not present

## 2020-03-23 DIAGNOSIS — Z79899 Other long term (current) drug therapy: Secondary | ICD-10-CM | POA: Diagnosis not present

## 2020-03-23 DIAGNOSIS — I10 Essential (primary) hypertension: Secondary | ICD-10-CM | POA: Diagnosis not present

## 2020-03-23 DIAGNOSIS — M79605 Pain in left leg: Secondary | ICD-10-CM | POA: Diagnosis not present

## 2020-03-23 NOTE — Discharge Instructions (Signed)
Keep covered.  See Vascular.   Take care  Dr. Lacinda Axon

## 2020-03-23 NOTE — ED Triage Notes (Signed)
Patient c/o ongoing pain and redness in her right ankle since she was last seen on 03/13/20.  Patient states that she finished her antibiotic on Friday.

## 2020-03-23 NOTE — ED Provider Notes (Signed)
[facility]    CSN: [id number] Arrival date & time: 03/23/20  1458      History   Chief Complaint Chief Complaint  Patient presents with  . Ankle Pain    right   HPI  79 year old female presents with an area of concern of her right lower extremity.  Patient recently seen by me and treated for cellulitis.  The redness has improved.  However, she is concerned about the burning sensation that she has in the area and the fact that the varicosities are superficial.  She is concerned that it may rupture.  She has had this previously.  Pain 5/10 in severity.  No relieving factors.  No other complaints this time.  Past Medical History:  Diagnosis Date  . Asthma   . Atrial fibrillation ([place])   . Hypertension   . Peripheral arterial disease [facility])     Patient Active Problem List   Diagnosis Date Noted  . Pre-diabetes 05/29/2018  . Primary insomnia 09/15/2017  . Essential hypertension 09/09/2017  . 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 Bilateral lower extremity edema 09/09/2017  . Pain in both lower extremities 09/09/2017  . Current use of long term anticoagulation 07/28/2017  . 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 Otitis externa, chronic 12/03/2015  . Obstructive sleep apnea 10/07/2015  . Asthma, persistent controlled 09/25/2015  . Acquired hypothyroidism 02/29/2012  . Chronic atrial fibrillation ([place]) 02/29/2012  . Hyperlipidemia 02/29/2012    Past Surgical History:  Procedure Laterality Date  . ABDOMINAL HYSTERECTOMY  1991   uterus only- still has cervix - done for prolapse  . COLONOSCOPY  2009   normal  . TOE SURGERY      OB History   No obstetric history on file.      Home Medications    Prior to Admission medications   Medication Sig Start Date End Date Taking? Authorizing Provider  ALPRAZolam (XANAX) 0.25 MG tablet Take 1 tablet (0.25 mg total) by mouth at bedtime. 11/01/19   Glean Hess, MD  Ascorbic Acid (VITAMIN C) 1000 MG tablet Take 1,000 mg by mouth daily.    [provider]  Calcium Carb-Cholecalciferol [phone] MG-UNIT TABS Take by mouth.    [provider]  cetirizine (ZYRTEC) 10 MG tablet Take 10 mg by mouth daily.    [provider]  cholecalciferol (VITAMIN D) 1000 units tablet Take 1,000 Units by mouth daily.    [provider]  dextromethorphan-guaiFENesin (MUCINEX DM) 30-600 MG 12hr tablet Take 1 tablet by mouth 2 (two) times daily as needed for cough.    [provider]  docusate sodium (COLACE) 100 MG capsule Take 100 mg by mouth daily.    [provider]  doxycycline (VIBRAMYCIN) 100 MG capsule Take 1 capsule (100 mg total) by mouth 2 (two) times daily. 03/13/20   Coral Spikes, DO  Fluticasone-Salmeterol (ADVAIR) 250-50 MCG/DOSE AEPB Inhale 1 puff into the lungs 2 (two) times daily.    [provider]  hydrochlorothiazide (HYDRODIURIL) 25 MG tablet Take 1 tablet by mouth once daily 07/14/19   Glean Hess, MD  levothyroxine (SYNTHROID) 75 MCG tablet TAKE 1 TABLET BY MOUTH DAILY BEFORE BREAKFAST 07/16/19   Glean Hess, MD  losartan (COZAAR) 50 MG tablet Take 1 tablet (50 mg total) by mouth daily. 03/31/19   Glean Hess, MD  MAGNESIUM-ZINC PO Take 500 mg by mouth at bedtime as needed.  [provider]  metoprolol tartrate (LOPRESSOR) 25 MG tablet Take 1 tablet (25 mg total) by mouth 2 (two) times daily. 07/16/19   Glean Hess, MD  montelukast (SINGULAIR) 10 MG tablet TAKE 1 TABLET BY MOUTH AT BEDTIME 11/30/19   Glean Hess, MD  multivitamin-lutein [facility]) CAPS capsule Take 1 capsule by mouth daily.    [provider]  neomycin-polymyxin-hydrocortisone (CORTISPORIN) 3.5-10000-1 OTIC suspension INSTILL 3 DROPS INTO EACH EAR THREE TIMES DAILY AS NEEDED 11/30/19   Glean Hess, MD  Rivaroxaban (XARELTO) 15 MG TABS tablet Take 15 mg by mouth  daily with supper.     [provider]  fexofenadine (ALLEGRA) 180 MG tablet Take 180 mg by mouth daily.  03/28/19  [provider]    Family History Family History  Problem Relation Age of Onset  . Breast cancer Other 56  . Dementia Mother   . Cancer Father        prostate and bone  . Heart attack Sister     Social History Social History   Tobacco Use  . Smoking status: Former Smoker    Packs/day: 0.50    Years: 29.00    Pack years: 14.50    Types: Cigarettes    Quit date: 09/28/1989    Years since quitting: 30.5  . Smokeless tobacco: Never Used  . Tobacco comment: smoking cessation materials not required  Vaping Use  . Vaping Use: Never used  Substance Use Topics  . Alcohol use: No  . Drug use: No     Allergies   Tramadol and Meloxicam   Review of Systems Review of Systems  Constitutional: Negative.   Musculoskeletal:       Right ankle pain/burning.   Physical Exam Triage Vital Signs ED Triage Vitals  Enc Vitals Group     BP 03/23/20 1623 (!) 178/85     Pulse Rate 03/23/20 1623 69     Resp 03/23/20 1623 14     Temp 03/23/20 1623 98.2 F (36.8 C)     Temp Source 03/23/20 1623 Oral     SpO2 03/23/20 1623 97 %     Weight 03/23/20 1620 194 lb (88 kg)     Height 03/23/20 1620 5\' 6"  (1.676 m)     Head Circumference --      Peak Flow --      Pain Score 03/23/20 1620 5     Pain Loc --      Pain Edu? --      Excl. in [place]? --    Updated Vital Signs BP (!) 178/85 (BP Location: Right Arm)   Pulse 69   Temp 98.2 F (36.8 C) (Oral)   Resp 14   Ht 5\' 6"  (1.676 m)   Wt 88 kg   SpO2 97%   BMI 31.31 kg/m   Visual Acuity Right Eye Distance:   Left Eye Distance:   Bilateral Distance:    Right Eye Near:   Left Eye Near:    Bilateral Near:     Physical Exam Vitals and nursing note reviewed.  Constitutional:      General: She is not in acute distress.    Appearance: Normal appearance. She is not ill-appearing.  HENT:     Head:  Normocephalic and atraumatic.  Eyes:     General:        Right eye: No discharge.        Left eye: No discharge.     Conjunctiva/sclera: Conjunctivae  normal.  Skin:    Comments: Patient has extensive varicosities throughout the lower extremities.  The area of concern is on her right ankle.  Superficial varicosity.  There appears to be inflammation to the area.  No evidence of infection at this time.  Neurological:     Mental Status: She is alert.  Psychiatric:        Mood and Affect: Mood normal.        Behavior: Behavior normal.    UC Treatments / Results  Labs (all labs ordered are listed, but only abnormal results are displayed) Labs Reviewed - No data to display  EKG   Radiology No results found.  Procedures Procedures (including critical care time)  Medications Ordered in UC Medications - No data to display  Initial Impression / Assessment and Plan / UC Course  I have reviewed the triage vital signs and the nursing notes.  Pertinent labs & imaging results that were available during my care of the patient were reviewed by me and considered in my medical decision making (see chart for details).    79 year old female presents with an area on her right lower extremity that is associated with a varicosity that is bothering her.  I recently treated her for cellulitis and it appears that the cellulitis has resolved.  She is primarily concerned that this varicosity is going to rupture as it is very close to the skin surface.  I have advised her that the safest plan of care is to have her see a vascular physician who can treat this area.  In the meantime, we will cover this area with a nonstick dressing so that it is protected and we minimize the risk of potential injury and subsequent rupture and bleeding.  Final Clinical Impressions(s) / UC Diagnoses   Final diagnoses:  Varicose veins of right lower extremity with inflammation     Discharge Instructions     Keep  covered.  See Vascular.   Take care  Dr. Lacinda Axon    ED Prescriptions    None     PDMP not reviewed this encounter.   Coral Spikes, [place] 03/23/20 1725

## 2020-03-24 ENCOUNTER — Telehealth (INDEPENDENT_AMBULATORY_CARE_PROVIDER_SITE_OTHER): Payer: Self-pay | Admitting: Vascular Surgery

## 2020-03-24 ENCOUNTER — Encounter: Payer: Self-pay | Admitting: Internal Medicine

## 2020-03-24 ENCOUNTER — Ambulatory Visit (INDEPENDENT_AMBULATORY_CARE_PROVIDER_SITE_OTHER): Payer: Medicare Other | Admitting: Internal Medicine

## 2020-03-24 VITALS — BP 130/68 | HR 61 | Temp 98.1°F | Ht 67.0 in | Wt 196.0 lb

## 2020-03-24 DIAGNOSIS — E871 Hypo-osmolality and hyponatremia: Secondary | ICD-10-CM | POA: Diagnosis not present

## 2020-03-24 DIAGNOSIS — R0981 Nasal congestion: Secondary | ICD-10-CM | POA: Diagnosis not present

## 2020-03-24 DIAGNOSIS — I10 Essential (primary) hypertension: Secondary | ICD-10-CM | POA: Diagnosis not present

## 2020-03-24 DIAGNOSIS — I83813 Varicose veins of bilateral lower extremities with pain: Secondary | ICD-10-CM

## 2020-03-24 NOTE — Telephone Encounter (Signed)
Called stating that she has a spot on her ankle that appeared about 2 weeks ago. She went to urgent care to get it looked at and they prescribed her antibiotics because it was infected. Dr. Lacinda Axon advised her to come in to be seen. Patient was last seen 10-04-19 foam sclero with JD. Patient would like to come in to be seen. Please advise.

## 2020-03-24 NOTE — Telephone Encounter (Signed)
See me or dew no studies

## 2020-03-24 NOTE — Patient Instructions (Addendum)
Cut the HCTZ in half and only take one half per day.  Use the Flonase nasal spray daily to help relieve sinus pressure.

## 2020-03-24 NOTE — Progress Notes (Signed)
Date:  03/24/2020   Name:  Ariana Guzman   DOB:  1941/05/26   MRN:  [id number]   Chief Complaint: Hypertension (follow up 226/129 last night, 143/77 before visit today , 6 cups of water due to electrolytes )  Hypertension This is a chronic problem. The problem has been rapidly worsening since onset. The problem is uncontrolled (very high yesterday in ED - labs and EKG were reassuring). Pertinent negatives include no chest pain, headaches, palpitations or shortness of breath. Past treatments include beta blockers, angiotensin blockers and diuretics.  Her BP improved with time and reassurance but she was advised to follow up here. Her sodium was noted to be lower than previously at 127.  Varicose veins - she has extensive varicose veins and small superficial spider veins and small nodules that she is concerned will bleed.  She is keeping her right leg wrapped with ACE.     Comprehensive Metabolic Panel (08/04/7587 10:21 PM EDT) Comprehensive Metabolic Panel (32/54/9826 10:21 PM EDT)  Component Value Ref Range Performed At Pathologist Signature  Sodium 127 (L) 135 - 145 mmol/L [facility]   Potassium 3.9 3.4 - 4.5 mmol/L [facility]   Chloride 90 (L) 98 - 107 mmol/L [facility]   Anion Gap 6 5 - 14 mmol/L [facility]   CO2 31.4 (H) 20.0 - 31.0 mmol/L [facility]   BUN 13 9 - 23 mg/dL [facility]   Creatinine 0.74 0.60 - 0.80 mg/dL [facility]   BUN/Creatinine Ratio 18  [facility]   EGFR CKD-EPI Non-African American, Female 77 >=60 mL/min/1.56m [facility]   EGFR CKD-EPI African American, Female 854>=60 [facility]   Glucose 112 70 - 179 mg/dL [facility]   Calcium 9.9 8.7 - 10.4 mg/dL [facility]   Albumin 4.2 3.4 - 5.0 g/dL [facility]     Total Protein 7.6 5.7 - 8.2 g/dL [facility]   Total Bilirubin 1.8 (H) 0.3 - 1.2 mg/dL [facility]   AST 24 <=34 U/L [facility]   ALT 22 10 - 49 U/L [facility]   Alkaline Phosphatase 72         CBC w/ Differential (03/23/2020 10:21 PM EDT) CBC w/ Differential (03/23/2020 10:21 PM EDT)  Component Value Ref Range Performed At Pathologist Signature  WBC 10.2 3.5 - 10.5 10*9/L [facility]   RBC 4.86 3.90 - 5.03 10*12/L [facility]   HGB 14.5 12.0 - 15.5 g/dL [facility]   HCT 43.1 35.0 - 44.0 % [facility]   MCV 88.7 82.0 - 98.0 fL [facility]   MCH 29.9 26.0 - 34.0 pg [facility]   MCHC 33.7 30.0 - 36.0 g/dL [facility]   RDW 14.1 12.0 - 15.0 % [facility]   MPV 7.3 7.0 - 10.0 fL [facility]   Platelet 285 150 - 450 10*9/L [facility]     CLINICAL INDICATION: [age]ears old Female with CHEST PAIN ; Chest Pain     COMPARISON: None    TECHNIQUE: PA and Lateral Chest Radiographs.    FINDINGS:     The lungs are hyperinflated. No focal consolidation.    No pleural effusion or pneumothorax.    Unremarkable cardiomediastinal silhouette.    Lab Results  Component Value Date   CREATININE 0.75 09/27/2019   BUN  13 09/27/2019   NA 131 (L) 09/27/2019   K 4.0 09/27/2019   CL 89 (L) 09/27/2019   CO2 26 09/27/2019   Lab Results  Component Value Date   CHOL 174 05/28/2019   HDL 59 05/28/2019   LDLCALC 99 05/28/2019   TRIG 84 05/28/2019   CHOLHDL 2.9 05/28/2019   Lab Results  Component Value Date   TSH 1.540 05/28/2019   Lab Results  Component Value Date   HGBA1C 6.5 (H) 09/27/2019   Lab Results  Component Value Date   WBC 8.3 05/28/2019   HGB 13.9 05/28/2019   HCT 40.7 05/28/2019   MCV 90 05/28/2019   PLT  290 05/28/2019   Lab Results  Component Value Date   ALT 15 05/28/2019   AST 18 05/28/2019   ALKPHOS 60 05/28/2019   BILITOT 1.1 05/28/2019     Review of Systems  Constitutional: Negative for chills, fatigue and fever.  Respiratory: Negative for cough, chest tightness and shortness of breath.   Cardiovascular: Positive for leg swelling (extensive varicosities). Negative for chest pain and palpitations.  Musculoskeletal: Negative for arthralgias and joint swelling.  Neurological: Negative for dizziness and headaches.    Patient Active Problem List   Diagnosis Date Noted  . Pre-diabetes 05/29/2018  . Primary insomnia 09/15/2017  . Essential hypertension 09/09/2017  . 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 Bilateral lower extremity edema 09/09/2017  . Pain in both lower extremities 09/09/2017  . Current use of long term anticoagulation 07/28/2017  . 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 Otitis externa, chronic 12/03/2015  . Obstructive sleep apnea 10/07/2015  . Asthma, persistent controlled 09/25/2015  . Acquired hypothyroidism 02/29/2012  . Chronic atrial fibrillation ([place]) 02/29/2012  . Hyperlipidemia 02/29/2012    Allergies  Allergen Reactions  . Tramadol Shortness Of Breath  . Meloxicam Rash    Past Surgical History:  Procedure Laterality Date  . ABDOMINAL HYSTERECTOMY  1991   uterus only- still has cervix - done for prolapse  . COLONOSCOPY  2009   normal  . TOE SURGERY      Social History   Tobacco Use  . Smoking status: Former Smoker    Packs/day: 0.50    Years: 29.00    Pack years: 14.50    Types: Cigarettes    Quit date: 09/28/1989    Years since quitting: 30.5  . Smokeless tobacco: Never Used  . Tobacco comment: smoking cessation materials not required  Vaping Use  . Vaping Use: Never used  Substance Use Topics  . Alcohol use: No  . Drug use: No     Medication list has been reviewed and  updated.  Current Meds  Medication Sig  . ALPRAZolam (XANAX) 0.25 MG tablet Take 1 tablet (0.25 mg total) by mouth at bedtime.  . Ascorbic Acid (VITAMIN C) 1000 MG tablet Take 1,000 mg by mouth daily.  . Calcium Carb-Cholecalciferol [phone] MG-UNIT TABS Take by mouth.  . cetirizine (ZYRTEC) 10 MG tablet Take 10 mg by mouth daily.  . cholecalciferol (VITAMIN D) 1000 units tablet Take 1,000 Units by mouth daily.  . Fluticasone-Salmeterol (ADVAIR) 250-50 MCG/DOSE AEPB Inhale 1 puff into the lungs 2 (two) times daily.  . hydrochlorothiazide (HYDRODIURIL) 25 MG tablet Take 1 tablet by mouth once daily  . levothyroxine (SYNTHROID) 75 MCG tablet TAKE 1 TABLET BY MOUTH DAILY BEFORE BREAKFAST  . losartan (COZAAR) 50 MG tablet Take 1 tablet (50 mg total) by mouth daily.  Marland Kitchen  MAGNESIUM-ZINC PO Take 500 mg by mouth at bedtime as needed.   . metoprolol tartrate (LOPRESSOR) 25 MG tablet Take 1 tablet (25 mg total) by mouth 2 (two) times daily.  . montelukast (SINGULAIR) 10 MG tablet TAKE 1 TABLET BY MOUTH AT BEDTIME  . multivitamin-lutein (OCUVITE-LUTEIN) CAPS capsule Take 1 capsule by mouth daily.  Marland Kitchen neomycin-polymyxin-hydrocortisone (CORTISPORIN) 3.5-10000-1 OTIC suspension INSTILL 3 DROPS INTO EACH EAR THREE TIMES DAILY AS NEEDED  . Rivaroxaban (XARELTO) 15 MG TABS tablet Take 15 mg by mouth daily with supper.     PHQ 2/9 Scores 09/27/2019 09/04/2019 05/28/2019 05/09/2019  PHQ - 2 Score 0 0 0 0  PHQ- 9 Score - - - -    No flowsheet data found.  BP Readings from Last 3 Encounters:  03/24/20 130/68  03/23/20 (!) 178/85  03/13/20 (!) 185/78    Physical Exam Vitals and nursing note reviewed.  Constitutional:      General: She is not in acute distress.    Appearance: Normal appearance. She is well-developed.  HENT:     Head: Normocephalic and atraumatic.     Right Ear: Tympanic membrane normal.     Left Ear: Tympanic membrane normal.     Ears:     Comments: Moderate cerumen  bilaterally Cardiovascular:     Rate and Rhythm: Normal rate and regular rhythm.     Heart sounds: No murmur heard.   Pulmonary:     Effort: Pulmonary effort is normal. No respiratory distress.     Breath sounds: No wheezing or rhonchi.  Musculoskeletal:        General: Swelling (extensive VV, superficial veins and spider veines in both lower extremities) present.     Cervical back: Normal range of motion.  Lymphadenopathy:     Cervical: No cervical adenopathy.  Skin:    General: Skin is warm and dry.     Capillary Refill: Capillary refill takes less than 2 seconds.     Findings: No rash.  Neurological:     General: No focal deficit present.     Mental Status: She is alert and oriented to person, place, and time.  Psychiatric:        Attention and Perception: Attention normal.        Mood and Affect: Mood normal.     Wt Readings from Last 3 Encounters:  03/24/20 196 lb (88.9 kg)  03/23/20 194 lb (88 kg)  03/13/20 197 lb 1.5 oz (89.4 kg)    BP 130/68   Pulse 61   Temp 98.1 F (36.7 C) (Oral)   Ht 5' 7"  (1.702 m)   Wt 196 lb (88.9 kg)   SpO2 96%   BMI 30.70 kg/m   Assessment and Plan: 1. Essential hypertension Clinically stable exam with well controlled BP but with a spike yesterday likely due to anxiety over VV and possible bleeding. Tolerating medications without side effects at this time. She should monitor at home to establish a pattern of BP readings. Continue metoprolol and losartan but reduce HCTZ to half per day.  2. Hyponatremia Reduce hctz to half per day Recheck with BP in one month  3. Varicose veins of both lower extremities with pain Continue ACE wrap - loosened today. See Dr. Lucky Cowboy later this week  4. Sinus congestion Continue saline spray and use Flonase if congested No evidence of bacterial infection at this time  Partially dictated using Editor, commissioning. Any errors are unintentional.  Halina Maidens, MD [facility]  03/24/2020

## 2020-03-25 ENCOUNTER — Other Ambulatory Visit: Payer: Self-pay | Admitting: Internal Medicine

## 2020-03-25 DIAGNOSIS — I10 Essential (primary) hypertension: Secondary | ICD-10-CM

## 2020-03-27 ENCOUNTER — Other Ambulatory Visit: Payer: Self-pay

## 2020-03-27 ENCOUNTER — Encounter (INDEPENDENT_AMBULATORY_CARE_PROVIDER_SITE_OTHER): Payer: Self-pay | Admitting: Nurse Practitioner

## 2020-03-27 ENCOUNTER — Ambulatory Visit (INDEPENDENT_AMBULATORY_CARE_PROVIDER_SITE_OTHER): Payer: Medicare Other | Admitting: Nurse Practitioner

## 2020-03-27 VITALS — BP 158/72 | HR 66 | Resp 16 | Wt 202.0 lb

## 2020-03-27 DIAGNOSIS — I10 Essential (primary) hypertension: Secondary | ICD-10-CM | POA: Diagnosis not present

## 2020-03-27 DIAGNOSIS — I83813 Varicose veins of bilateral lower extremities with pain: Secondary | ICD-10-CM

## 2020-03-27 NOTE — Progress Notes (Signed)
Subjective:    Patient ID: Ariana Guzman, female    DOB: 1941-06-11, 79 y.o.   MRN: [id number] Chief Complaint  Patient presents with  . Follow-up    The patient presents today due to concern for possible bleeding varicosities.  The patient has previously had treatment for her varicose veins, she is underwent endovenous laser ablation of the bilateral varicosities in 2019.  The patient underwent some sclerotherapy however it was interrupted at the beginning of 2020 due to COVID-19 restrictions.  At that time our office stop doing all elective procedures.  The patient only received 2 sclerotherapy treatments.  Prior to her treatment the patient had a hemorrhagic event with bleeding from her varicosities of the right lower extremity.  The patient noticed a small reddened area with a new lesion that was concerning for possible bleeding.  She noticed some bruising under the skin.  The patient has had a small amount of bleeding from the area.  The patient is concerned that this could develop into another hemorrhagic event.  She denies any fever, chills, nausea, vomiting or diarrhea.  She denies any trauma to the area.  The patient also utilizes medical grade 1 compression stockings daily in addition to daily exercise and elevation.  Despite these interventions she still continues to have pain and discomfort as well and these varicosities.   Review of Systems  Skin: Positive for color change and wound.  All other systems reviewed and are negative.      Objective:   Physical Exam Vitals reviewed.  HENT:     Head: Normocephalic.  Cardiovascular:     Rate and Rhythm: Normal rate and regular rhythm.     Pulses: Normal pulses.     Comments: Extensive scattered varicosities in the bilateral ankle areas. Pulmonary:     Effort: Pulmonary effort is normal.     Breath sounds: Normal breath sounds.  Musculoskeletal:        General: Normal range of motion.     Right lower leg: Edema present.    Neurological:     Mental Status: She is alert and oriented to person, place, and time.  Psychiatric:        Mood and Affect: Mood normal.        Behavior: Behavior normal.        Thought Content: Thought content normal.        Judgment: Judgment normal.     BP (!) 158/72 (BP Location: Right Arm)   Pulse 66   Resp 16   Wt 202 lb (91.6 kg)   BMI 31.64 kg/m   Past Medical History:  Diagnosis Date  . Asthma   . Atrial fibrillation ([place])   . Hypertension   . Peripheral arterial disease ([place])     Social History   Socioeconomic History  . Marital status: Widowed    Spouse name: Not on file  . Number of children: 2  . Years of education: Not on file  . Highest education level: 12th grade  Occupational History  . Occupation: retired  Tobacco Use  . Smoking status: Former Smoker    Packs/day: 0.50    Years: 29.00    Pack years: 14.50    Types: Cigarettes    Quit date: 09/28/1989    Years since quitting: 30.5  . Smokeless tobacco: Never Used  . Tobacco comment: smoking cessation materials not required  Vaping Use  . Vaping Use: Never used  Substance and Sexual Activity  . Alcohol  use: No  . Drug use: No  . Sexual activity: Not Currently  Other Topics Concern  . Not on file  Social History Narrative  . Not on file   Social Determinants of Health   Financial Resource Strain:   . Difficulty of Paying Living Expenses:   Food Insecurity:   . Worried About Charity fundraiser in the Last Year:   . Arboriculturist in the Last Year:   Transportation Needs:   . Film/video editor (Medical):   Marland Kitchen Lack of Transportation (Non-Medical):   Physical Activity:   . Days of Exercise per Week:   . Minutes of Exercise per Session:   Stress:   . Feeling of Stress :   Social Connections:   . Frequency of Communication with Friends and Family:   . Frequency of Social Gatherings with Friends and Family:   . Attends Religious Services:   . Active Member of Clubs or  Organizations:   . Attends Archivist Meetings:   Marland Kitchen Marital Status:   Intimate Partner Violence:   . Fear of Current or Ex-Partner:   . Emotionally Abused:   Marland Kitchen Physically Abused:   . Sexually Abused:     Past Surgical History:  Procedure Laterality Date  . ABDOMINAL HYSTERECTOMY  1991   uterus only- still has cervix - done for prolapse  . COLONOSCOPY  2009   normal  . TOE SURGERY      Family History  Problem Relation Age of Onset  . Breast cancer Other 56  . Dementia Mother   . Cancer Father        prostate and bone  . Heart attack Sister     Allergies  Allergen Reactions  . Tramadol Shortness Of Breath  . Tramadol-Acetaminophen   . Meloxicam Rash       Assessment & Plan:   1. Varicose veins of both lower extremities with pain Recommend:  The patient has had successful ablation of the previously incompetent saphenous venous system but still has persistent symptoms of pain and swelling that are having a negative impact on daily life and daily activities.  The patient also is unable to complete sclerotherapy due to COVID-19 restrictions last year.  More importantly, the patient has had bleeding episodes which may be a precursor to a possible recurrent hemorrhagic event.  Patient should undergo injection sclerotherapy to treat the residual varicosities.  The risks, benefits and alternative therapies were reviewed in detail with the patient.  All questions were answered.  The patient agrees to proceed with sclerotherapy at their convenience.  The patient will continue wearing the graduated compression stockings and using the over-the-counter pain medications to treat her symptoms.       2. Essential hypertension Continue antihypertensive medications as already ordered, these medications have been reviewed and there are no changes at this time.    Current Outpatient Medications on File Prior to Visit  Medication Sig Dispense Refill  . ALPRAZolam (XANAX)  0.25 MG tablet Take 1 tablet (0.25 mg total) by mouth at bedtime. 30 tablet 5  . Ascorbic Acid (VITAMIN C) 1000 MG tablet Take 1,000 mg by mouth daily.    . Calcium Carb-Cholecalciferol [phone] MG-UNIT TABS Take by mouth.    . cetirizine (ZYRTEC) 10 MG tablet Take 10 mg by mouth daily.    . cholecalciferol (VITAMIN D) 1000 units tablet Take 1,000 Units by mouth daily.    Marland Kitchen dextromethorphan-guaiFENesin (MUCINEX DM) 30-600 MG 12hr tablet Take  1 tablet by mouth 2 (two) times daily as needed for cough.     . Fluticasone-Salmeterol (ADVAIR) 250-50 MCG/DOSE AEPB Inhale 1 puff into the lungs 2 (two) times daily.    . hydrochlorothiazide (HYDRODIURIL) 25 MG tablet Take 1 tablet by mouth once daily 90 tablet 3  . levothyroxine (SYNTHROID) 75 MCG tablet TAKE 1 TABLET BY MOUTH DAILY BEFORE BREAKFAST 90 tablet 3  . losartan (COZAAR) 50 MG tablet Take 1 tablet by mouth once daily 90 tablet 0  . MAGNESIUM-ZINC PO Take 500 mg by mouth at bedtime as needed.     . metoprolol tartrate (LOPRESSOR) 25 MG tablet Take 1 tablet (25 mg total) by mouth 2 (two) times daily. 180 tablet 3  . montelukast (SINGULAIR) 10 MG tablet TAKE 1 TABLET BY MOUTH AT BEDTIME 30 tablet 0  . multivitamin-lutein (OCUVITE-LUTEIN) CAPS capsule Take 1 capsule by mouth daily.    . Rivaroxaban (XARELTO) 15 MG TABS tablet Take 15 mg by mouth daily with supper.     . neomycin-polymyxin-hydrocortisone (CORTISPORIN) 3.5-10000-1 OTIC suspension INSTILL 3 DROPS INTO EACH EAR THREE TIMES DAILY AS NEEDED (Patient not taking: Reported on 03/27/2020) 10 mL 0  . [DISCONTINUED] fexofenadine (ALLEGRA) 180 MG tablet Take 180 mg by mouth daily.     No current facility-administered medications on file prior to visit.    There are no Patient Instructions on file for this visit. No follow-ups on file.   Kris Hartmann, NP

## 2020-03-31 ENCOUNTER — Other Ambulatory Visit: Payer: Self-pay

## 2020-03-31 ENCOUNTER — Telehealth: Payer: Self-pay | Admitting: Internal Medicine

## 2020-03-31 ENCOUNTER — Encounter: Payer: Self-pay | Admitting: Emergency Medicine

## 2020-03-31 ENCOUNTER — Ambulatory Visit: Payer: Self-pay | Admitting: *Deleted

## 2020-03-31 ENCOUNTER — Ambulatory Visit
Admission: EM | Admit: 2020-03-31 | Discharge: 2020-03-31 | Disposition: A | Discharge status: Home / Self Care | Payer: Medicare Other | Attending: Internal Medicine | Admitting: Internal Medicine

## 2020-03-31 DIAGNOSIS — I10 Essential (primary) hypertension: Secondary | ICD-10-CM | POA: Diagnosis not present

## 2020-03-31 NOTE — Discharge Instructions (Signed)
Your blood is not terribly high at this time and keep a diary of your blood pressure readings. Send your BP diary to your Primary Care doctor for her to make further adjustments to your blood pressure medications.

## 2020-03-31 NOTE — ED Triage Notes (Signed)
Pt states her BP has been elevated. She was seen in the ED on 03/23/20 for HTN. Her bp last night ranged from 190-225/90-118. She denies chest pain or shortness of breath. She has not checked her BP today but feels like it is elevated. She also states she has pain in her left ear. Started yesterday.

## 2020-03-31 NOTE — Telephone Encounter (Signed)
Noted. If patient calls back she can make an appointment for an in person visit to discuss anxiety with Dr Army Melia later this week. This is not a same day appt slot.   CM

## 2020-03-31 NOTE — Telephone Encounter (Signed)
Patient is calling to report that she went to [facility] and they advised her to take xanax for anxiety. And patient is not aware what what they advised her for hypertension. Patient was advised to go to UC for elevated BP. Please advise [phone]

## 2020-03-31 NOTE — ED Provider Notes (Signed)
[facility]    CSN: [id number] Arrival date & time: 03/31/20  1217      History   Chief Complaint Chief Complaint  Patient presents with  . Hypertension  . Otalgia    left    HPI AMILY DEPP is a 79 y.o. female comes to the urgent care for blood pressure check after she noticed elevated blood pressure measurement.  Patient: Primary care physician who advised him to come to urgent care for blood pressure adjustments.  Patient's primary care recently decreased her hydrochlorothiazide pills from 25 mg daily to 12.5mg  daily.  She denies any chest pain, abdominal pain, headache, nausea, vomiting.  Blood pressure in the urgent care is mildly elevated.  Patient states that her blood pressure has been in the range of 190-210 range at home. HPI  Past Medical History:  Diagnosis Date  . Asthma   . Atrial fibrillation ([place])   . Hypertension   . Peripheral arterial disease [facility])     Patient Active Problem List   Diagnosis Date Noted  . Pre-diabetes 05/29/2018  . Primary insomnia 09/15/2017  . Anxiety, generalized 09/15/2017  . Essential hypertension 09/09/2017  . 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 Bilateral lower extremity edema 09/09/2017  . Pain in both lower extremities 09/09/2017  . Current use of long term anticoagulation 07/28/2017  . 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 Otitis externa, chronic 12/03/2015  . Obstructive sleep apnea 10/07/2015  . Asthma, persistent controlled 09/25/2015  . Acquired hypothyroidism 02/29/2012  . Chronic atrial fibrillation ([place]) 02/29/2012  . Hyperlipidemia 02/29/2012    Past Surgical History:  Procedure Laterality Date  . ABDOMINAL HYSTERECTOMY  1991   uterus only- still has cervix - done for prolapse  . COLONOSCOPY  2009   normal  . TOE SURGERY      OB History   No obstetric history on file.      Home Medications    Prior to Admission medications     Medication Sig Start Date End Date Taking? Authorizing Provider  ALPRAZolam (XANAX) 0.25 MG tablet Take 1 tablet (0.25 mg total) by mouth at bedtime. 11/01/19  Yes Glean Hess, MD  Ascorbic Acid (VITAMIN C) 1000 MG tablet Take 1,000 mg by mouth daily.   Yes [provider]  Calcium Carb-Cholecalciferol [phone] MG-UNIT TABS Take by mouth.   Yes [provider]  cetirizine (ZYRTEC) 10 MG tablet Take 10 mg by mouth daily.   Yes [provider]  cholecalciferol (VITAMIN D) 1000 units tablet Take 1,000 Units by mouth daily.   Yes [provider]  dextromethorphan-guaiFENesin (MUCINEX DM) 30-600 MG 12hr tablet Take 1 tablet by mouth 2 (two) times daily as needed for cough.    Yes [provider]  Fluticasone-Salmeterol (ADVAIR) 250-50 MCG/DOSE AEPB Inhale 1 puff into the lungs 2 (two) times daily.   Yes [provider]  hydrochlorothiazide (HYDRODIURIL) 25 MG tablet Take 1 tablet by mouth once daily Patient taking differently: 12.5 mg.  07/14/19  Yes Glean Hess, MD  levothyroxine (SYNTHROID) 75 MCG tablet TAKE 1 TABLET BY MOUTH DAILY BEFORE BREAKFAST 07/16/19  Yes Glean Hess, MD  losartan (COZAAR) 50 MG tablet Take 1 tablet by mouth once daily 03/25/20  Yes Glean Hess, MD  MAGNESIUM-ZINC PO Take 500 mg by mouth at bedtime as needed.    Yes [provider]  metoprolol tartrate (LOPRESSOR) 25 MG tablet Take 1 tablet (25  mg total) by mouth 2 (two) times daily. 07/16/19  Yes Glean Hess, MD  montelukast (SINGULAIR) 10 MG tablet TAKE 1 TABLET BY MOUTH AT BEDTIME 11/30/19  Yes Glean Hess, MD  multivitamin-lutein [facility]) CAPS capsule Take 1 capsule by mouth daily.   Yes [provider]  Rivaroxaban (XARELTO) 15 MG TABS tablet Take 15 mg by mouth daily with supper.    Yes [provider]  fexofenadine (ALLEGRA) 180 MG tablet Take 180 mg by mouth daily.  03/28/19  [provider]    Family History Family History  Problem Relation Age of Onset  . Breast cancer Other 56  . Dementia Mother   . Cancer Father        prostate and bone  . Heart attack Sister     Social History Social History   Tobacco Use  . Smoking status: Former Smoker    Packs/day: 0.50    Years: 29.00    Pack years: 14.50    Types: Cigarettes    Quit date: 09/28/1989    Years since quitting: 30.5  . Smokeless tobacco: Never Used  . Tobacco comment: smoking cessation materials not required  Vaping Use  . Vaping Use: Never used  Substance Use Topics  . Alcohol use: No  . Drug use: No     Allergies   Tramadol, Tramadol-acetaminophen, and Meloxicam   Review of Systems Review of Systems  HENT: Negative.   Cardiovascular: Negative for chest pain.  Gastrointestinal: Negative for abdominal pain.  Neurological: Negative for headaches.     Physical Exam Triage Vital Signs ED Triage Vitals  Enc Vitals Group     BP 03/31/20 1251 (!) 148/79     Pulse Rate 03/31/20 1251 66     Resp 03/31/20 1251 18     Temp 03/31/20 1251 98.3 F (36.8 C)     Temp Source 03/31/20 1251 Oral     SpO2 03/31/20 1251 97 %     Weight 03/31/20 1246 201 lb 15.1 oz (91.6 kg)     Height 03/31/20 1246 5\' 7"  (1.702 m)     Head Circumference --      Peak Flow --      Pain Score 03/31/20 1244 6     Pain Loc --      Pain Edu? --      Excl. in [place]? --    No data found.  Updated Vital Signs BP (!) 148/79 (BP Location: Left Arm)   Pulse 66   Temp 98.3 F (36.8 C) (Oral)   Resp 18   Ht 5\' 7"  (1.702 m)   Wt 91.6 kg   SpO2 97%   BMI 31.63 kg/m   Visual Acuity Right Eye Distance:   Left Eye Distance:   Bilateral Distance:    Right Eye Near:   Left Eye Near:    Bilateral Near:     Physical Exam Vitals and nursing note reviewed.  Cardiovascular:     Rate and Rhythm: Normal rate and regular rhythm.     Pulses: Normal pulses.     Heart sounds: Normal heart sounds.  Pulmonary:     Effort:  Pulmonary effort is normal.     Breath sounds: Normal breath sounds.  Abdominal:     General: Bowel sounds are normal.     Palpations: Abdomen is soft.  Neurological:     Mental Status: She is alert.      UC Treatments / Results  Labs (all  labs ordered are listed, but only abnormal results are displayed) Labs Reviewed - No data to display  EKG   Radiology No results found.  Procedures Procedures (including critical care time)  Medications Ordered in UC Medications - No data to display  Initial Impression / Assessment and Plan / UC Course  I have reviewed the triage vital signs and the nursing notes.  Pertinent labs & imaging results that were available during my care of the patient were reviewed by me and considered in my medical decision making (see chart for details).     1.  Hypertension, suboptimally controlled: Patient is advised to keep a blood pressure diary I am unable to make changes to her antihypertensive medication regimen at this time because of the current changes made by the primary care physician.  I encouraged the patient to make an appointment with a primary care physician and presented her blood pressure diary/log to the primary care provider for blood pressure medication changes if necessary.  Patient verbalized understanding. Final Clinical Impressions(s) / UC Diagnoses   Final diagnoses:  Hypertension, uncontrolled     Discharge Instructions     Your blood is not terribly high at this time and keep a diary of your blood pressure readings. Send your BP diary to your Primary Care doctor for her to make further adjustments to your blood pressure medications.   ED Prescriptions    None     PDMP not reviewed this encounter.   Chase Picket, MD 03/31/20 2203

## 2020-03-31 NOTE — Telephone Encounter (Signed)
Patient was seen at office last week and her BP medication was lowered. Patient has anxiety and her BP is high.Patient does not have BP cuff at home. Her daughter measured it last night- BP 225/118- it came down to 190/90. Patient's daughters have been checking BP this week and she reports all her readings have been elevated.Patient denies symptoms- although she states she is fatigued and her anxiety is high. Patient is requesting medication for anxiety. Call to office to see if patient can be seen today- recommend UC. Patient advised and she states she will try to go.   Reason for Disposition . [0] Systolic BP  >= 370 OR Diastolic >= 488  AND [8] having NO cardiac or neurologic symptoms  Answer Assessment - Initial Assessment Questions 1. BLOOD PRESSURE: "What is the blood pressure?" "Did you take at least two measurements 5 minutes apart?"     Last night- 190/90, High reading all week- 179/80 (range) she does not have list of readings her daughter has them 2. ONSET: "When did you take your blood pressure?"     Last night was last check 3. HOW: "How did you obtain the blood pressure?" (e.g., visiting nurse, automatic home BP monitor)     Manual and digital 4. HISTORY: "Do you have a history of high blood pressure?"     yes 5. MEDICATIONS: "Are you taking any medications for blood pressure?" "Have you missed any doses recently?"     Patient has reduced diuretic by half- BP has been high 6. OTHER SYMPTOMS: "Do you have any symptoms?" (e.g., headache, chest pain, blurred vision, difficulty breathing, weakness)     No- patient is very anxious. Patient reports she is tired 7. PREGNANCY: "Is there any chance you are pregnant?" "When was your last menstrual period?"     n/a  Protocols used: BLOOD PRESSURE - HIGH-A-AH

## 2020-03-31 NOTE — Telephone Encounter (Signed)
Patient wants to know should she continue to take hydrochlorothiazide (HYDRODIURIL) 25 MG tablet [[id number]] Patient is concerned about her blood pressure. Patient was offered an hospital follow up however, the patient would like to know what do if ever blood pressure rises in the mean time. [phone]

## 2020-04-03 ENCOUNTER — Ambulatory Visit (INDEPENDENT_AMBULATORY_CARE_PROVIDER_SITE_OTHER): Payer: Medicare Other | Admitting: Internal Medicine

## 2020-04-03 ENCOUNTER — Other Ambulatory Visit: Payer: Self-pay

## 2020-04-03 ENCOUNTER — Encounter: Payer: Self-pay | Admitting: Internal Medicine

## 2020-04-03 VITALS — BP 142/70 | HR 58 | Temp 97.8°F | Ht 67.0 in | Wt 201.0 lb

## 2020-04-03 DIAGNOSIS — I10 Essential (primary) hypertension: Secondary | ICD-10-CM | POA: Diagnosis not present

## 2020-04-03 DIAGNOSIS — J3089 Other allergic rhinitis: Secondary | ICD-10-CM | POA: Diagnosis not present

## 2020-04-03 DIAGNOSIS — R7303 Prediabetes: Secondary | ICD-10-CM

## 2020-04-03 MED ORDER — LOSARTAN POTASSIUM 100 MG PO TABS: 100.0000 mg | ORAL_TABLET | Freq: Every day | ORAL | 1 refills | Status: DC

## 2020-04-03 NOTE — Patient Instructions (Signed)
Increase losartan to 2 - 50 mg per day until you pick up the new Rx  You can increase the Zyrtec 10 mg to twice a day for allergies

## 2020-04-03 NOTE — Progress Notes (Signed)
Date:  04/03/2020   Name:  Ariana Guzman   DOB:  1941/04/22   MRN:  [id number]  Pt is accompanied by her friend. Chief Complaint: Hypertension (follow up ) and Cough (X2 week, clear mucous, runny nose after coughing for a while, no fever)  Hypertension This is a chronic problem. The problem has been waxing and waning (was in UC a week ago with elevated readings but medications not changed) since onset. The problem is controlled (158/70 yesterday). Pertinent negatives include no chest pain, headaches, palpitations or shortness of breath. Past treatments include beta blockers, diuretics and angiotensin blockers. The current treatment provides significant improvement. There are no compliance problems.   Diabetes She presents for her follow-up diabetic visit. She has type 2 diabetes mellitus. Her disease course has been improving. Pertinent negatives for hypoglycemia include no dizziness, headaches, nervousness/anxiousness or tremors. Pertinent negatives for diabetes include no chest pain, no fatigue and no polyuria. Current diabetic treatment includes diet. She is compliant with treatment most of the time.  Allergic cough - she has asthma which is well controlled.  She also has PND and takes zyrtec once a day.  She mostly has drainage at night causing a dry cough.  No other sx of fever, loss of taste/smell, SOB.  She is fully vaccinated and has no known exposures.  Lab Results  Component Value Date   CREATININE 0.75 09/27/2019   BUN 13 09/27/2019   NA 131 (L) 09/27/2019   K 4.0 09/27/2019   CL 89 (L) 09/27/2019   CO2 26 09/27/2019   Lab Results  Component Value Date   CHOL 174 05/28/2019   HDL 59 05/28/2019   LDLCALC 99 05/28/2019   TRIG 84 05/28/2019   CHOLHDL 2.9 05/28/2019   Lab Results  Component Value Date   TSH 1.540 05/28/2019   Lab Results  Component Value Date   HGBA1C 6.5 (H) 09/27/2019   Lab Results  Component Value Date   WBC 8.3 05/28/2019   HGB 13.9 05/28/2019     HCT 40.7 05/28/2019   MCV 90 05/28/2019   PLT 290 05/28/2019   Lab Results  Component Value Date   ALT 15 05/28/2019   AST 18 05/28/2019   ALKPHOS 60 05/28/2019   BILITOT 1.1 05/28/2019     Review of Systems  Constitutional: Negative for appetite change, fatigue, fever and unexpected weight change.  HENT: Positive for ear pain and postnasal drip. Negative for sore throat and trouble swallowing.   Eyes: Negative for visual disturbance.  Respiratory: Positive for cough. Negative for chest tightness, shortness of breath and wheezing.   Cardiovascular: Negative for chest pain, palpitations and leg swelling.  Gastrointestinal: Negative for abdominal pain.  Endocrine: Negative for polyuria.  Genitourinary: Negative for dysuria and hematuria.  Musculoskeletal: Negative for arthralgias.  Neurological: Negative for dizziness, tremors, numbness and headaches.  Psychiatric/Behavioral: Positive for sleep disturbance (due to cough). Negative for dysphoric mood. The patient is not nervous/anxious.     Patient Active Problem List   Diagnosis Date Noted  . Pre-diabetes 05/29/2018  . Primary insomnia 09/15/2017  . Anxiety, generalized 09/15/2017  . Essential hypertension 09/09/2017  . 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 Bilateral lower extremity edema 09/09/2017  . Pain in both lower extremities 09/09/2017  . Current use of long term anticoagulation 07/28/2017  . 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 Otitis externa, chronic 12/03/2015  . Obstructive sleep apnea 10/07/2015  . Asthma,  persistent controlled 09/25/2015  . Acquired hypothyroidism 02/29/2012  . Chronic atrial fibrillation ([place]) 02/29/2012  . Hyperlipidemia 02/29/2012    Allergies  Allergen Reactions  . Tramadol Shortness Of Breath  . Tramadol-Acetaminophen   . Meloxicam Rash    Past Surgical History:  Procedure Laterality Date  . ABDOMINAL HYSTERECTOMY  1991    uterus only- still has cervix - done for prolapse  . COLONOSCOPY  2009   normal  . TOE SURGERY      Social History   Tobacco Use  . Smoking status: Former Smoker    Packs/day: 0.50    Years: 29.00    Pack years: 14.50    Types: Cigarettes    Quit date: 09/28/1989    Years since quitting: 30.5  . Smokeless tobacco: Never Used  . Tobacco comment: smoking cessation materials not required  Vaping Use  . Vaping Use: Never used  Substance Use Topics  . Alcohol use: No  . Drug use: No     Medication list has been reviewed and updated.  Current Meds  Medication Sig  . ALPRAZolam (XANAX) 0.25 MG tablet Take 1 tablet (0.25 mg total) by mouth at bedtime.  . Ascorbic Acid (VITAMIN C) 1000 MG tablet Take 1,000 mg by mouth daily.  . Calcium Carb-Cholecalciferol [id number] MG-UNIT TABS Take by mouth.  . cetirizine (ZYRTEC) 10 MG tablet Take 10 mg by mouth daily.  . cholecalciferol (VITAMIN D) 1000 units tablet Take 1,000 Units by mouth daily.  Marland Kitchen dextromethorphan-guaiFENesin (MUCINEX DM) 30-600 MG 12hr tablet Take 1 tablet by mouth 2 (two) times daily as needed for cough.   . Fluticasone-Salmeterol (ADVAIR) 250-50 MCG/DOSE AEPB Inhale 1 puff into the lungs 2 (two) times daily.  . hydrochlorothiazide (HYDRODIURIL) 25 MG tablet Take 1 tablet by mouth once daily (Patient taking differently: 12.5 mg. )  . latanoprost (XALATAN) 0.005 % ophthalmic solution 1 drop at bedtime.  Marland Kitchen levothyroxine (SYNTHROID) 75 MCG tablet TAKE 1 TABLET BY MOUTH DAILY BEFORE BREAKFAST  . losartan (COZAAR) 50 MG tablet Take 1 tablet by mouth once daily  . MAGNESIUM-ZINC PO Take 500 mg by mouth at bedtime as needed.   . metoprolol tartrate (LOPRESSOR) 25 MG tablet Take 1 tablet (25 mg total) by mouth 2 (two) times daily.  . montelukast (SINGULAIR) 10 MG tablet TAKE 1 TABLET BY MOUTH AT BEDTIME  . multivitamin-lutein (OCUVITE-LUTEIN) CAPS capsule Take 1 capsule by mouth daily.  . Rivaroxaban (XARELTO) 15 MG TABS tablet  Take 15 mg by mouth daily with supper.     PHQ 2/9 Scores 04/03/2020 09/27/2019 09/04/2019 05/28/2019  PHQ - 2 Score 0 0 0 0  PHQ- 9 Score 2 - - -    GAD 7 : Generalized Anxiety Score 04/03/2020  Nervous, Anxious, on Edge 0  Control/stop worrying 0  Worry too much - different things 0  Trouble relaxing 0  Restless 0  Easily annoyed or irritable 0  Afraid - awful might happen 0  Total GAD 7 Score 0  Anxiety Difficulty Not difficult at all    BP Readings from Last 3 Encounters:  04/03/20 (!) 142/70  03/31/20 (!) 148/79  03/27/20 (!) 158/72    Physical Exam Vitals and nursing note reviewed.  Constitutional:      General: She is not in acute distress.    Appearance: She is well-developed.  HENT:     Head: Normocephalic and atraumatic.     Right Ear: Decreased hearing noted. No tenderness. No middle ear effusion. Tympanic  membrane is retracted. Tympanic membrane is not erythematous.     Left Ear: Decreased hearing noted. No tenderness.  No middle ear effusion. Tympanic membrane is retracted. Tympanic membrane is not erythematous.     Nose: No congestion.  Cardiovascular:     Rate and Rhythm: Normal rate and regular rhythm.     Pulses: Normal pulses.     Heart sounds: No murmur heard.   Pulmonary:     Effort: Pulmonary effort is normal. No respiratory distress.     Breath sounds: Normal breath sounds. No wheezing.  Musculoskeletal:        General: Normal range of motion.     Cervical back: Normal range of motion.  Lymphadenopathy:     Cervical: No cervical adenopathy.  Skin:    General: Skin is warm and dry.     Findings: No rash.  Neurological:     Mental Status: She is alert and oriented to person, place, and time.  Psychiatric:        Attention and Perception: Attention normal.        Mood and Affect: Mood normal.     Wt Readings from Last 3 Encounters:  04/03/20 201 lb (91.2 kg)  03/31/20 201 lb 15.1 oz (91.6 kg)  03/27/20 202 lb (91.6 kg)    BP (!) 142/70    Pulse (!) 58   Temp 97.8 F (36.6 C) (Oral)   Ht 5\' 7"  (1.702 m)   Wt 201 lb (91.2 kg)   SpO2 96%   BMI 31.48 kg/m   Assessment and Plan: 1. Essential hypertension BP not well controlled. Will increase losartan to 100 mg per day Pt to monitor BP at home about once per week Follow up in 2 months - Basic metabolic panel - losartan (COZAAR) 100 MG tablet; Take 1 tablet (100 mg total) by mouth daily.  Dispense: 90 tablet; Refill: 1  2. Pre-diabetes Check labs today - Hemoglobin A1c  3. Environmental and seasonal allergies With dry cough at night Recommend adding a second zyrtec in the evening Continue singulair. Narcotic cough suppressant is not indicated.   Partially dictated using Editor, commissioning. Any errors are unintentional.  Halina Maidens, MD [facility]  04/03/2020

## 2020-04-04 LAB — HEMOGLOBIN A1C
Est. average glucose Bld gHb Est-mCnc: 143 mg/dL
Hgb A1c MFr Bld: 6.6 % — ABNORMAL HIGH (ref 4.8–5.6)

## 2020-04-04 LAB — BASIC METABOLIC PANEL
BUN/Creatinine Ratio: 15 (ref 12–28)
BUN: 12 mg/dL (ref 8–27)
CO2: 29 mmol/L (ref 20–29)
Calcium: 9.6 mg/dL (ref 8.7–10.3)
Chloride: 84 mmol/L — ABNORMAL LOW (ref 96–106)
Creatinine, Ser: 0.81 mg/dL (ref 0.57–1.00)
GFR calc Af Amer: 79 mL/min/{1.73_m2} (ref >59)
GFR calc non Af Amer: 69 mL/min/{1.73_m2} (ref >59)
Glucose: 109 mg/dL — ABNORMAL HIGH (ref 65–99)
Potassium: 4.3 mmol/L (ref 3.5–5.2)
Sodium: 125 mmol/L — ABNORMAL LOW (ref 134–144)

## 2020-04-05 ENCOUNTER — Other Ambulatory Visit: Payer: Self-pay | Admitting: Internal Medicine

## 2020-04-05 ENCOUNTER — Encounter: Payer: Self-pay | Admitting: Internal Medicine

## 2020-04-05 ENCOUNTER — Telehealth: Payer: Self-pay | Admitting: Internal Medicine

## 2020-04-05 DIAGNOSIS — E871 Hypo-osmolality and hyponatremia: Secondary | ICD-10-CM

## 2020-04-05 NOTE — Telephone Encounter (Signed)
Additional information regarding labs.  Sodium was low.  She should stop the HCTZ and limit her fluid intake to 60 oz per day (this can just be estimated).  She can also eat whatever amount of sodium she desires.   She needs to return in one month for a recheck of sodium (lab only - orders will be pended).

## 2020-04-07 NOTE — Telephone Encounter (Signed)
Called pt with additional labs told pt to stop taking HCTZ and limit her fluid intake to about 60 oz. Sodium is low stated that she can eat foods with sodium. Pt verbalized understanding. Will recheck labs only in one month. Scheduled pts appt.   KP

## 2020-04-09 ENCOUNTER — Encounter (INDEPENDENT_AMBULATORY_CARE_PROVIDER_SITE_OTHER): Payer: Self-pay | Admitting: Vascular Surgery

## 2020-04-09 ENCOUNTER — Other Ambulatory Visit: Payer: Self-pay

## 2020-04-09 ENCOUNTER — Ambulatory Visit (INDEPENDENT_AMBULATORY_CARE_PROVIDER_SITE_OTHER): Payer: Medicare Other | Admitting: Vascular Surgery

## 2020-04-09 VITALS — BP 159/66 | HR 73 | Resp 16 | Wt 205.8 lb

## 2020-04-09 DIAGNOSIS — I83813 Varicose veins of bilateral lower extremities with pain: Secondary | ICD-10-CM | POA: Diagnosis not present

## 2020-04-09 NOTE — Progress Notes (Signed)
Varicose veins of bilateral  lower extremity with inflammation (454.1  I83.10) Current Plans   Indication: Patient presents with symptomatic varicose veins of the bilateral  lower extremity.   Procedure: Sclerotherapy using hypertonic saline mixed with 1% Lidocaine was performed on the bilateral lower extremity. Compression wraps were placed. The patient tolerated the procedure well. 

## 2020-04-24 ENCOUNTER — Ambulatory Visit: Payer: Medicare Other | Admitting: Internal Medicine

## 2020-04-24 DIAGNOSIS — H6123 Impacted cerumen, bilateral: Secondary | ICD-10-CM | POA: Diagnosis not present

## 2020-04-24 DIAGNOSIS — H903 Sensorineural hearing loss, bilateral: Secondary | ICD-10-CM | POA: Diagnosis not present

## 2020-04-30 ENCOUNTER — Encounter (INDEPENDENT_AMBULATORY_CARE_PROVIDER_SITE_OTHER): Payer: Self-pay | Admitting: Vascular Surgery

## 2020-04-30 ENCOUNTER — Ambulatory Visit (INDEPENDENT_AMBULATORY_CARE_PROVIDER_SITE_OTHER): Payer: Medicare Other | Admitting: Vascular Surgery

## 2020-04-30 ENCOUNTER — Other Ambulatory Visit: Payer: Self-pay | Admitting: Internal Medicine

## 2020-04-30 ENCOUNTER — Other Ambulatory Visit: Payer: Self-pay

## 2020-04-30 VITALS — BP 163/77 | HR 81 | Ht 67.0 in | Wt 198.0 lb

## 2020-04-30 DIAGNOSIS — I83813 Varicose veins of bilateral lower extremities with pain: Secondary | ICD-10-CM | POA: Diagnosis not present

## 2020-04-30 DIAGNOSIS — F5101 Primary insomnia: Secondary | ICD-10-CM

## 2020-04-30 NOTE — Telephone Encounter (Signed)
Requested medication (s) are due for refill today -yes  Requested medication (s) are on the active medication list -yes  Future visit scheduled -yes  Last refill: 03/31/20  Notes to clinic: Request for non delegated Rx   Requested Prescriptions  Pending Prescriptions Disp Refills   ALPRAZolam (XANAX) 0.25 MG tablet [Pharmacy Med Name: ALPRAZolam 0.25 MG Oral Tablet] 30 tablet 0    Sig: TAKE 1 TABLET BY MOUTH AT BEDTIME      Not Delegated - Psychiatry:  Anxiolytics/Hypnotics Failed - 04/30/2020  9:06 AM      Failed - This refill cannot be delegated      Failed - Urine Drug Screen completed in last 360 days.      Passed - Valid encounter within last 6 months    Recent Outpatient Visits           3 weeks ago Essential hypertension   [facility] Glean Hess, MD   1 month ago Essential hypertension   [facility] Glean Hess, MD   7 months ago Essential hypertension   [facility] Glean Hess, MD   7 months ago Cough   [facility] Glean Hess, MD   11 months ago Asthma, persistent controlled   [facility] Glean Hess, MD       Future Appointments             In 4 weeks [facility] Melia Jesse Sans, MD [facility], [facility]                Requested Prescriptions  Pending Prescriptions Disp Refills   ALPRAZolam (XANAX) 0.25 MG tablet [Pharmacy Med Name: ALPRAZolam 0.25 MG Oral Tablet] 30 tablet 0    Sig: TAKE 1 TABLET BY MOUTH AT BEDTIME      Not Delegated - Psychiatry:  Anxiolytics/Hypnotics Failed - 04/30/2020  9:06 AM      Failed - This refill cannot be delegated      Failed - Urine Drug Screen completed in last 360 days.      Passed - Valid encounter within last 6 months    Recent Outpatient Visits           3 weeks ago Essential hypertension   [facility] Glean Hess, MD   1 month ago Essential hypertension   [place], [place]   7 months  ago Essential hypertension   [facility] Glean Hess, MD   7 months ago Cough   [facility] Glean Hess, MD   11 months ago Asthma, persistent controlled   [facility] Glean Hess, MD       Future Appointments             In 4 weeks Army Melia Jesse Sans, MD [facility], [facility]

## 2020-04-30 NOTE — Progress Notes (Signed)
Varicose veins of bilateral  lower extremity with inflammation (454.1  I83.10) Current Plans   Indication: Patient presents with symptomatic varicose veins of the bilateral  lower extremity.   Procedure: Sclerotherapy using hypertonic saline mixed with 1% Lidocaine was performed on the bilateral lower extremity. Compression wraps were placed. The patient tolerated the procedure well. 

## 2020-05-08 ENCOUNTER — Ambulatory Visit (INDEPENDENT_AMBULATORY_CARE_PROVIDER_SITE_OTHER): Payer: Medicare Other | Admitting: Internal Medicine

## 2020-05-08 ENCOUNTER — Other Ambulatory Visit: Payer: Self-pay

## 2020-05-08 ENCOUNTER — Encounter: Payer: Self-pay | Admitting: Internal Medicine

## 2020-05-08 VITALS — BP 152/94 | HR 68 | Temp 97.8°F | Ht 67.0 in | Wt 199.0 lb

## 2020-05-08 DIAGNOSIS — E871 Hypo-osmolality and hyponatremia: Secondary | ICD-10-CM

## 2020-05-08 DIAGNOSIS — I10 Essential (primary) hypertension: Secondary | ICD-10-CM | POA: Diagnosis not present

## 2020-05-08 DIAGNOSIS — Z23 Encounter for immunization: Secondary | ICD-10-CM | POA: Diagnosis not present

## 2020-05-08 MED ORDER — SPIRONOLACTONE 25 MG PO TABS: 25.0000 mg | ORAL_TABLET | Freq: Every day | ORAL | 0 refills | Status: DC

## 2020-05-08 NOTE — Progress Notes (Signed)
Date:  05/08/2020   Name:  Ariana Guzman   DOB:  [birth year]   MRN:  [id number]   Chief Complaint: Labs Only (recheck sodium ), Hypertension (follow up ), and Flu Vaccine  Hypertension This is a chronic problem. The problem has been gradually improving since onset. The problem is resistant (slightly improved with increased dose of losartan). Associated symptoms include shortness of breath. Pertinent negatives include no chest pain or headaches. Past treatments include angiotensin blockers and beta blockers. The current treatment provides moderate improvement. There is no history of kidney disease or CAD/MI.   Hyponatremia -  HCTZ was stopped last visit due to low sodium.  She has been restricting her fluids to 80 oz per day.  She is using salt as desired in her diet.    She feels fairly well, has baseline SOB with exertion but no chest pain.  She has mild baseline edema and wears TED hose due to varicose veins.   Lab Results  Component Value Date   CREATININE 0.81 04/03/2020   BUN 12 04/03/2020   NA 125 (L) 04/03/2020   K 4.3 04/03/2020   CL 84 (L) 04/03/2020   CO2 29 04/03/2020   Lab Results  Component Value Date   CHOL 174 05/28/2019   HDL 59 05/28/2019   LDLCALC 99 05/28/2019   TRIG 84 05/28/2019   CHOLHDL 2.9 05/28/2019   Lab Results  Component Value Date   TSH 1.540 05/28/2019   Lab Results  Component Value Date   HGBA1C 6.6 (H) 04/03/2020   Lab Results  Component Value Date   WBC 8.3 05/28/2019   HGB 13.9 05/28/2019   HCT 40.7 05/28/2019   MCV 90 05/28/2019   PLT 290 05/28/2019   Lab Results  Component Value Date   ALT 15 05/28/2019   AST 18 05/28/2019   ALKPHOS 60 05/28/2019   BILITOT 1.1 05/28/2019     Review of Systems  Constitutional: Negative for chills, fatigue, fever and unexpected weight change.  HENT: Negative for trouble swallowing.   Respiratory: Positive for shortness of breath. Negative for cough, chest tightness and wheezing.     Cardiovascular: Positive for leg swelling. Negative for chest pain.  Genitourinary: Negative for difficulty urinating.  Skin: Negative for color change and rash.  Neurological: Negative for dizziness, tremors, weakness, light-headedness and headaches.  Psychiatric/Behavioral: Negative for dysphoric mood and sleep disturbance.    Patient Active Problem List   Diagnosis Date Noted  . Hyponatremia 04/05/2020  . Environmental and seasonal allergies 04/03/2020  . Pre-diabetes 05/29/2018  . Primary insomnia 09/15/2017  . Anxiety, generalized 09/15/2017  . Essential hypertension 09/09/2017  . 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 Bilateral lower extremity edema 09/09/2017  . Pain in both lower extremities 09/09/2017  . Current use of long term anticoagulation 07/28/2017  . 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 Otitis externa, chronic 12/03/2015  . Obstructive sleep apnea 10/07/2015  . Asthma, persistent controlled 09/25/2015  . Acquired hypothyroidism 02/29/2012  . Chronic atrial fibrillation ([place]) 02/29/2012  . Hyperlipidemia 02/29/2012    Allergies  Allergen Reactions  . Tramadol Shortness Of Breath  . Tramadol-Acetaminophen   . Meloxicam Rash    Past Surgical History:  Procedure Laterality Date  . ABDOMINAL HYSTERECTOMY  1991   uterus only- still has cervix - done for prolapse  . COLONOSCOPY  2009   normal  . TOE SURGERY      Social History  Tobacco Use  . Smoking status: Former Smoker    Packs/day: 0.50    Years: 29.00    Pack years: 14.50    Types: Cigarettes    Quit date: 09/28/1989    Years since quitting: 30.6  . Smokeless tobacco: Never Used  . Tobacco comment: smoking cessation materials not required  Vaping Use  . Vaping Use: Never used  Substance Use Topics  . Alcohol use: No  . Drug use: No     Medication list has been reviewed and updated.  Current Meds  Medication Sig  . ALPRAZolam  (XANAX) 0.25 MG tablet TAKE 1 TABLET BY MOUTH AT BEDTIME  . Ascorbic Acid (VITAMIN C) 1000 MG tablet Take 1,000 mg by mouth daily.  . Calcium Carb-Cholecalciferol [phone] MG-UNIT TABS Take by mouth.  . cetirizine (ZYRTEC) 10 MG tablet Take 10 mg by mouth daily.  . cholecalciferol (VITAMIN D) 1000 units tablet Take 1,000 Units by mouth daily.  . Fluticasone-Salmeterol (ADVAIR) 250-50 MCG/DOSE AEPB Inhale 1 puff into the lungs 2 (two) times daily.  Marland Kitchen latanoprost (XALATAN) 0.005 % ophthalmic solution 1 drop at bedtime.  Marland Kitchen levothyroxine (SYNTHROID) 75 MCG tablet TAKE 1 TABLET BY MOUTH DAILY BEFORE BREAKFAST  . losartan (COZAAR) 100 MG tablet Take 1 tablet (100 mg total) by mouth daily.  Marland Kitchen MAGNESIUM-ZINC PO Take 500 mg by mouth at bedtime as needed.   . metoprolol tartrate (LOPRESSOR) 25 MG tablet Take 1 tablet (25 mg total) by mouth 2 (two) times daily.  . multivitamin-lutein (OCUVITE-LUTEIN) CAPS capsule Take 1 capsule by mouth daily.  . Omega-3 Fatty Acids (FISH OIL PO) Take by mouth.  . Rivaroxaban (XARELTO) 15 MG TABS tablet Take 15 mg by mouth daily with supper.     PHQ 2/9 Scores 04/03/2020 09/27/2019 09/04/2019 05/28/2019  PHQ - 2 Score 0 0 0 0  PHQ- 9 Score 2 - - -    GAD 7 : Generalized Anxiety Score 04/03/2020  Nervous, Anxious, on Edge 0  Control/stop worrying 0  Worry too much - different things 0  Trouble relaxing 0  Restless 0  Easily annoyed or irritable 0  Afraid - awful might happen 0  Total GAD 7 Score 0  Anxiety Difficulty Not difficult at all    BP Readings from Last 3 Encounters:  05/08/20 (!) 152/94  04/30/20 (!) 163/77  04/09/20 (!) 159/66    Physical Exam Vitals and nursing note reviewed.  Constitutional:      General: She is not in acute distress.    Appearance: She is well-developed. She is not ill-appearing.  HENT:     Head: Normocephalic and atraumatic.  Cardiovascular:     Rate and Rhythm: Normal rate and regular rhythm.     Pulses: Normal pulses.      Heart sounds: No murmur heard.   Pulmonary:     Effort: Pulmonary effort is normal. No respiratory distress.     Breath sounds: No wheezing or rhonchi.  Musculoskeletal:     Cervical back: Normal range of motion.     Right lower leg: Edema (1+ pitting) present.     Left lower leg: Edema (1+ pitting) present.  Lymphadenopathy:     Cervical: No cervical adenopathy.  Skin:    General: Skin is warm and dry.     Capillary Refill: Capillary refill takes less than 2 seconds.     Findings: No rash.  Neurological:     General: No focal deficit present.     Mental Status:  She is alert and oriented to person, place, and time.  Psychiatric:        Mood and Affect: Mood normal.     Wt Readings from Last 3 Encounters:  05/08/20 199 lb (90.3 kg)  04/30/20 198 lb (89.8 kg)  04/09/20 205 lb 12.8 oz (93.4 kg)    BP (!) 152/94 (BP Location: Right Arm, Patient Position: Sitting)   Pulse 68   Temp 97.8 F (36.6 C) (Oral)   Ht 5\' 7"  (1.702 m)   Wt 199 lb (90.3 kg)   SpO2 96%   BMI 31.17 kg/m   Assessment and Plan: 1. Essential hypertension BP not controlled since stopping HCTZ Continue losartan and metoprolol Add aldactone daily Recheck in one month - spironolactone (ALDACTONE) 25 MG tablet; Take 1 tablet (25 mg total) by mouth daily.  Dispense: 90 tablet; Refill: 0  2. Hyponatremia Continue fluid restriction and salt ad lib HCTZ stopped as this can contribute - Basic metabolic panel   Partially dictated using Editor, commissioning. Any errors are unintentional.  Halina Maidens, MD [facility]  05/08/2020

## 2020-05-08 NOTE — Patient Instructions (Addendum)
Continue Losartan and Metoprolol.  Add Spironolactone one a day to help control swelling and blood pressure.

## 2020-05-09 LAB — BASIC METABOLIC PANEL
BUN/Creatinine Ratio: 19 (ref 12–28)
BUN: 15 mg/dL (ref 8–27)
CO2: 26 mmol/L (ref 20–29)
Calcium: 9.5 mg/dL (ref 8.7–10.3)
Chloride: 99 mmol/L (ref 96–106)
Creatinine, Ser: 0.81 mg/dL (ref 0.57–1.00)
GFR calc Af Amer: 79 mL/min/{1.73_m2} (ref >59)
GFR calc non Af Amer: 69 mL/min/{1.73_m2} (ref >59)
Glucose: 109 mg/dL — ABNORMAL HIGH (ref 65–99)
Potassium: 4.3 mmol/L (ref 3.5–5.2)
Sodium: 138 mmol/L (ref 134–144)

## 2020-05-12 ENCOUNTER — Ambulatory Visit (INDEPENDENT_AMBULATORY_CARE_PROVIDER_SITE_OTHER): Payer: Medicare Other

## 2020-05-12 DIAGNOSIS — Z1231 Encounter for screening mammogram for malignant neoplasm of breast: Secondary | ICD-10-CM | POA: Diagnosis not present

## 2020-05-12 DIAGNOSIS — Z0000 Encounter for general adult medical examination without abnormal findings: Secondary | ICD-10-CM

## 2020-05-12 DIAGNOSIS — Z780 Asymptomatic menopausal state: Secondary | ICD-10-CM | POA: Diagnosis not present

## 2020-05-12 NOTE — Progress Notes (Signed)
Subjective:   JACQUELYNN FRIEND is a 79 y.o. female who presents for Medicare Annual (Subsequent) preventive examination.  Virtual Visit via Telephone Note  I connected with  [name] on 05/12/20 at 11:20 AM EDT by telephone and verified that I am speaking with the correct person using two identifiers.  Medicare Annual Wellness visit completed telephonically due to Covid-19 pandemic.   Location: Patient: home Provider: [facility]   I discussed the limitations, risks, security and privacy concerns of performing an evaluation and management service by telephone and the availability of in person appointments. The patient expressed understanding and agreed to proceed.  Unable to perform video visit due to video visit attempted and failed and/or patient does not have video capability.   Some vital signs may be absent or patient reported.   Clemetine Marker, LPN     Review of Systems     Cardiac Risk Factors include: advanced age (>26men, >46 women);hypertension;obesity (BMI >30kg/m2)     Objective:    Today's Vitals   05/12/20 1135  PainSc: 5    There is no height or weight on file to calculate BMI.  Advanced Directives 05/12/2020 03/31/2020 03/23/2020 03/13/2020 05/09/2019 04/24/2018 02/04/2018  Does Patient Have a Medical Advance Directive? No No No No No No No  Would patient like information on creating a medical advance directive? No - Patient declined - - - No - Patient declined Yes (MAU/Ambulatory/Procedural Areas - Information given) No - Patient declined    Current Medications (verified) Outpatient Encounter Medications as of 05/12/2020  Medication Sig  . ALPRAZolam (XANAX) 0.25 MG tablet TAKE 1 TABLET BY MOUTH AT BEDTIME  . Ascorbic Acid (VITAMIN C) 1000 MG tablet Take 1,000 mg by mouth daily.  . Calcium Carb-Cholecalciferol [phone] MG-UNIT TABS Take by mouth.  . cetirizine (ZYRTEC) 10 MG tablet Take 10 mg by mouth daily.  . cholecalciferol (VITAMIN D) 1000 units tablet Take  1,000 Units by mouth daily.  . Fluticasone-Salmeterol (ADVAIR) 250-50 MCG/DOSE AEPB Inhale 1 puff into the lungs 2 (two) times daily.  Marland Kitchen latanoprost (XALATAN) 0.005 % ophthalmic solution 1 drop at bedtime.  Marland Kitchen levothyroxine (SYNTHROID) 75 MCG tablet TAKE 1 TABLET BY MOUTH DAILY BEFORE BREAKFAST  . losartan (COZAAR) 100 MG tablet Take 1 tablet (100 mg total) by mouth daily.  Marland Kitchen MAGNESIUM-ZINC PO Take 500 mg by mouth at bedtime as needed.   . metoprolol tartrate (LOPRESSOR) 25 MG tablet Take 1 tablet (25 mg total) by mouth 2 (two) times daily.  . multivitamin-lutein (OCUVITE-LUTEIN) CAPS capsule Take 1 capsule by mouth daily.  . Omega-3 Fatty Acids (FISH OIL PO) Take by mouth.  . Rivaroxaban (XARELTO) 15 MG TABS tablet Take 15 mg by mouth daily with supper.   Marland Kitchen spironolactone (ALDACTONE) 25 MG tablet Take 1 tablet (25 mg total) by mouth daily.  . [DISCONTINUED] fexofenadine (ALLEGRA) 180 MG tablet Take 180 mg by mouth daily.   No facility-administered encounter medications on file as of 05/12/2020.    Allergies (verified) Tramadol, Tramadol-acetaminophen, and Meloxicam   History: Past Medical History:  Diagnosis Date  . Asthma   . Atrial fibrillation ([place])   . Hypertension   . Peripheral arterial disease [facility])    Past Surgical History:  Procedure Laterality Date  . ABDOMINAL HYSTERECTOMY  1991   uterus only- still has cervix - done for prolapse  . COLONOSCOPY  2009   normal  . TOE SURGERY     Family History  Problem Relation Age of Onset  .  Breast cancer Other 56  . Dementia Mother   . Cancer Father        prostate and bone  . Heart attack Sister    Social History   Socioeconomic History  . Marital status: Widowed    Spouse name: Not on file  . Number of children: 2  . Years of education: Not on file  . Highest education level: 12th grade  Occupational History  . Occupation: retired  Tobacco Use  . Smoking status: Former Smoker    Packs/day: 0.50    Years: 29.00     Pack years: 14.50    Types: Cigarettes    Quit date: 09/28/1989    Years since quitting: 30.6  . Smokeless tobacco: Never Used  . Tobacco comment: smoking cessation materials not required  Vaping Use  . Vaping Use: Never used  Substance and Sexual Activity  . Alcohol use: No  . Drug use: No  . Sexual activity: Not Currently  Other Topics Concern  . Not on file  Social History Narrative   Pt lives alone   Social Determinants of Health   Financial Resource Strain: Low Risk   . Difficulty of Paying Living Expenses: Not hard at all  Food Insecurity: No Food Insecurity  . Worried About Charity fundraiser in the Last Year: Never true  . Ran Out of Food in the Last Year: Never true  Transportation Needs: No Transportation Needs  . Lack of Transportation (Medical): No  . Lack of Transportation (Non-Medical): No  Physical Activity: Inactive  . Days of Exercise per Week: 0 days  . Minutes of Exercise per Session: 0 min  Stress: No Stress Concern Present  . Feeling of Stress : Only a little  Social Connections: Moderately Isolated  . Frequency of Communication with Friends and Family: More than three times a week  . Frequency of Social Gatherings with Friends and Family: More than three times a week  . Attends Religious Services: More than 4 times per year  . Active Member of Clubs or Organizations: No  . Attends Archivist Meetings: Never  . Marital Status: Widowed    Tobacco Counseling Counseling given: Not Answered Comment: smoking cessation materials not required   Clinical Intake:  Pre-visit preparation completed: Yes  Pain : 0-10 Pain Score: 5  Pain Type: Chronic pain Pain Location: Leg Pain Orientation: Right Pain Descriptors / Indicators: Aching, Discomfort, Sore Pain Onset: In the past 7 days Pain Frequency: Constant     Nutritional Risks: None Diabetes: No  How often do you need to have someone help you when you read instructions, pamphlets, or  other written materials from your doctor or pharmacy?: 1 - Never   Interpreter Needed?: No  Information entered by :: Clemetine Marker LPN   Activities of Daily Living In your present state of health, do you have any difficulty performing the following activities: 05/12/2020  Hearing? Y  Comment declines hearing aids  Vision? N  Difficulty concentrating or making decisions? N  Walking or climbing stairs? Y  Dressing or bathing? N  Doing errands, shopping? N  Preparing Food and eating ? N  Using the Toilet? N  In the past six months, have you accidently leaked urine? N  Do you have problems with loss of bowel control? N  Managing your Medications? N  Managing your Finances? N  Housekeeping or managing your Housekeeping? N  Some recent data might be hidden    Patient Care Team: Army Melia,  Jesse Sans, MD as PCP - General (Internal Medicine) Idamae Schuller, Laban Emperor, MD as Consulting Physician (Pulmonary Disease) Edwyna Ready, MD as Consulting Physician (Cardiology) Dr Jerrilyn Cairo as Consulting Physician (Sleep Medicine) Eulogio Bear, MD as Consulting Physician (Ophthalmology) Lucky Cowboy Erskine Squibb, MD as Referring Physician (Vascular Surgery)  Indicate any recent Medical Services you may have received from other than [facility] providers in the past year (date may be approximate).     Assessment:   This is a routine wellness examination for [facility].  Hearing/Vision screen  Hearing Screening   125Hz  250Hz  500Hz  1000Hz  2000Hz  3000Hz  4000Hz  6000Hz  8000Hz   Right ear:           Left ear:           Comments: Pt c/o mild hearing difficulty but declines hearing aids   Vision Screening Comments: Annual vision screenings done at [facility] Dr. Edison Pace  Dietary issues and exercise activities discussed: Current Exercise Habits: The patient does not participate in regular exercise at present, Exercise limited by: orthopedic condition(s)  Goals    . DIET - INCREASE WATER INTAKE      Recommend to drink at least 6-8 8oz glasses of water per day.      Depression Screen PHQ 2/9 Scores 05/12/2020 04/03/2020 09/27/2019 09/04/2019 05/28/2019 05/09/2019 01/03/2019  PHQ - 2 Score 0 0 0 0 0 0 0  PHQ- 9 Score - 2 - - - - -    Fall Risk Fall Risk  05/12/2020 09/27/2019 09/04/2019 05/09/2019 01/03/2019  Falls in the past year? 0 0 0 0 0  Number falls in past yr: 0 0 0 0 0  Injury with Fall? 0 0 0 0 0  Risk for fall due to : Impaired balance/gait;Impaired mobility History of fall(s);Impaired balance/gait;Impaired mobility History of fall(s) - -  Risk for fall due to: Comment - - - - -  Follow up Falls prevention discussed Falls evaluation completed Falls evaluation completed Falls prevention discussed Falls evaluation completed    Any stairs in or around the home? No  If so, are there any without handrails? No  Home free of loose throw rugs in walkways, pet beds, electrical cords, etc? Yes  Adequate lighting in your home to reduce risk of falls? Yes   ASSISTIVE DEVICES UTILIZED TO PREVENT FALLS:  Life alert? No  Use of a cane, walker or w/c? Yes  Grab bars in the bathroom? No  Shower chair or bench in shower? Yes  Elevated toilet seat or a handicapped toilet? yes  TIMED UP AND GO:  Was the test performed? No . Telephonic visit.   Cognitive Function:     6CIT Screen 05/09/2019 04/24/2018  What Year? 0 points 0 points  What month? 0 points 0 points  What time? 0 points 3 points  Count back from 20 0 points 0 points  Months in reverse 0 points 0 points  Repeat phrase 0 points 0 points  Total Score 0 3    Immunizations Immunization History  Administered Date(s) Administered  . Fluad Quad(high Dose 65+) 05/28/2019, 05/08/2020  . Influenza, High Dose Seasonal PF 08/11/2017, 04/24/2018  . PFIZER SARS-COV-2 Vaccination 10/08/2019, 10/29/2019  . Pneumococcal Conjugate-13 04/24/2018  . Pneumococcal Polysaccharide-23 05/28/2019  . Tdap 02/26/2015    TDAP status: Up to date     Flu Vaccine status: Up to date   Pneumococcal vaccine status: Up to date   Covid-19 vaccine status: Completed vaccines  Qualifies for Shingles Vaccine? Yes   Zostavax  completed No   Shingrix Completed?: No.    Education has been provided regarding the importance of this vaccine. Patient has been advised to call insurance company to determine out of pocket expense if they have not yet received this vaccine. Advised may also receive vaccine at local pharmacy or Health Dept. Verbalized acceptance and understanding.  Screening Tests Health Maintenance  Topic Date Due  . DEXA SCAN  Never done  . MAMMOGRAM  03/24/2021 (Originally 08/03/2019)  . TETANUS/TDAP  02/25/2025  . INFLUENZA VACCINE  Completed  . COVID-19 Vaccine  Completed  . PNA vac Low Risk Adult  Completed    Health Maintenance  Health Maintenance Due  Topic Date Due  . DEXA SCAN  Never done    Colorectal cancer screening: No longer required.    Mammogram status: Completed 08/02/18. Repeat every year orderd 05/09/19. Order valid until 07/08/20.   Bone Density status: Ordered 05/09/19. Pt provided with contact info and advised to call to schedule appt.  Lung Cancer Screening: (Low Dose CT Chest recommended if Age 88-80 years, 30 pack-year currently smoking OR have quit w/in 15years.) does not qualify.    Additional Screening:  Hepatitis C Screening: does not qualify  Vision Screening: Recommended annual ophthalmology exams for early detection of glaucoma and other disorders of the eye. Is the patient up to date with their annual eye exam?  Yes  Who is the provider or what is the name of the office in which the patient attends annual eye exams? Dr. Edison Pace [facility]  Dental Screening: Recommended annual dental exams for proper oral hygiene  Community Resource Referral / Chronic Care Management: CRR required this visit?  No   CCM required this visit?  No      Plan:     I have personally reviewed and  noted the following in the patient's chart:   . Medical and social history . Use of alcohol, tobacco or illicit drugs  . Current medications and supplements . Functional ability and status . Nutritional status . Physical activity . Advanced directives . List of other physicians . Hospitalizations, surgeries, and ER visits in previous 12 months . Vitals . Screenings to include cognitive, depression, and falls . Referrals and appointments  In addition, I have reviewed and discussed with patient certain preventive protocols, quality metrics, and best practice recommendations. A written personalized care plan for preventive services as well as general preventive health recommendations were provided to patient.     Clemetine Marker, LPN   [id number]   Nurse Notes: none

## 2020-05-12 NOTE — Patient Instructions (Signed)
Ariana Guzman , Thank you for taking time to come for your Medicare Wellness Visit. I appreciate your ongoing commitment to your health goals. Please review the following plan we discussed and let me know if I can assist you in the future.   Screening recommendations/referrals: Colonoscopy: no longer required Mammogram: done 08/02/18. Please call [phone] to schedule your mammogram and bone density screening.   Bone Density: due Recommended yearly ophthalmology/optometry visit for glaucoma screening and checkup Recommended yearly dental visit for hygiene and checkup  Vaccinations: Influenza vaccine: done 05/08/20 Pneumococcal vaccine: done 05/28/19 Tdap vaccine: done 02/26/15 Shingles vaccine: Shingrix discussed. Please contact your pharmacy for coverage information.  Covid-19: done 10/08/19 & 10/29/19  Advanced directives: Advance directive discussed with you today. Even though you declined this today please call our office should you change your mind and we can give you the proper paperwork for you to fill out.  Conditions/risks identified: Recommend increasing physical activity as tolerated   Next appointment: Follow up in one year for your annual wellness visit    Preventive Care 65 Years and Older, Female Preventive care refers to lifestyle choices and visits with your health care provider that can promote health and wellness. What does preventive care include?  A yearly physical exam. This is also called an annual well check.  Dental exams once or twice a year.  Routine eye exams. Ask your health care provider how often you should have your eyes checked.  Personal lifestyle choices, including:  Daily care of your teeth and gums.  Regular physical activity.  Eating a healthy diet.  Avoiding tobacco and drug use.  Limiting alcohol use.  Practicing safe sex.  Taking low-dose aspirin every day.  Taking vitamin and mineral supplements as recommended by your health care  provider. What happens during an annual well check? The services and screenings done by your health care provider during your annual well check will depend on your age, overall health, lifestyle risk factors, and family history of disease. Counseling  Your health care provider may ask you questions about your:  Alcohol use.  Tobacco use.  Drug use.  Emotional well-being.  Home and relationship well-being.  Sexual activity.  Eating habits.  History of falls.  Memory and ability to understand (cognition).  Work and work Statistician.  Reproductive health. Screening  You may have the following tests or measurements:  Height, weight, and BMI.  Blood pressure.  Lipid and cholesterol levels. These may be checked every 5 years, or more frequently if you are over 16 years old.  Skin check.  Lung cancer screening. You may have this screening every year starting at age 14 if you have a 30-pack-year history of smoking and currently smoke or have quit within the past 15 years.  Fecal occult blood test (FOBT) of the stool. You may have this test every year starting at age 12.  Flexible sigmoidoscopy or colonoscopy. You may have a sigmoidoscopy every 5 years or a colonoscopy every 10 years starting at age 72.  Hepatitis C blood test.  Hepatitis B blood test.  Sexually transmitted disease (STD) testing.  Diabetes screening. This is done by checking your blood sugar (glucose) after you have not eaten for a while (fasting). You may have this done every 1-3 years.  Bone density scan. This is done to screen for osteoporosis. You may have this done starting at age 52.  Mammogram. This may be done every 1-2 years. Talk to your health care provider about how often  you should have regular mammograms. Talk with your health care provider about your test results, treatment options, and if necessary, the need for more tests. Vaccines  Your health care provider may recommend certain  vaccines, such as:  Influenza vaccine. This is recommended every year.  Tetanus, diphtheria, and acellular pertussis (Tdap, Td) vaccine. You may need a Td booster every 10 years.  Zoster vaccine. You may need this after age 25.  Pneumococcal 13-valent conjugate (PCV13) vaccine. One dose is recommended after age 55.  Pneumococcal polysaccharide (PPSV23) vaccine. One dose is recommended after age 2. Talk to your health care provider about which screenings and vaccines you need and how often you need them. This information is not intended to replace advice given to you by your health care provider. Make sure you discuss any questions you have with your health care provider. Document Released: 08/29/2015 Document Revised: 04/21/2016 Document Reviewed: 06/03/2015 Elsevier Interactive Patient Education  2017 [employer] Prevention in the Home Falls can cause injuries. They can happen to people of all ages. There are many things you can do to make your home safe and to help prevent falls. What can I do on the outside of my home?  Regularly fix the edges of walkways and driveways and fix any cracks.  Remove anything that might make you trip as you walk through a door, such as a raised step or threshold.  Trim any bushes or trees on the path to your home.  Use bright outdoor lighting.  Clear any walking paths of anything that might make someone trip, such as rocks or tools.  Regularly check to see if handrails are loose or broken. Make sure that both sides of any steps have handrails.  Any raised decks and porches should have guardrails on the edges.  Have any leaves, snow, or ice cleared regularly.  Use sand or salt on walking paths during winter.  Clean up any spills in your garage right away. This includes oil or grease spills. What can I do in the bathroom?  Use night lights.  Install grab bars by the toilet and in the tub and shower. Do not use towel bars as grab  bars.  Use non-skid mats or decals in the tub or shower.  If you need to sit down in the shower, use a plastic, non-slip stool.  Keep the floor dry. Clean up any water that spills on the floor as soon as it happens.  Remove soap buildup in the tub or shower regularly.  Attach bath mats securely with double-sided non-slip rug tape.  Do not have throw rugs and other things on the floor that can make you trip. What can I do in the bedroom?  Use night lights.  Make sure that you have a light by your bed that is easy to reach.  Do not use any sheets or blankets that are too big for your bed. They should not hang down onto the floor.  Have a firm chair that has side arms. You can use this for support while you get dressed.  Do not have throw rugs and other things on the floor that can make you trip. What can I do in the kitchen?  Clean up any spills right away.  Avoid walking on wet floors.  Keep items that you use a lot in easy-to-reach places.  If you need to reach something above you, use a strong step stool that has a grab bar.  Keep electrical cords  out of the way.  Do not use floor polish or wax that makes floors slippery. If you must use wax, use non-skid floor wax.  Do not have throw rugs and other things on the floor that can make you trip. What can I do with my stairs?  Do not leave any items on the stairs.  Make sure that there are handrails on both sides of the stairs and use them. Fix handrails that are broken or loose. Make sure that handrails are as long as the stairways.  Check any carpeting to make sure that it is firmly attached to the stairs. Fix any carpet that is loose or worn.  Avoid having throw rugs at the top or bottom of the stairs. If you do have throw rugs, attach them to the floor with carpet tape.  Make sure that you have a light switch at the top of the stairs and the bottom of the stairs. If you do not have them, ask someone to add them for  you. What else can I do to help prevent falls?  Wear shoes that:  Do not have high heels.  Have rubber bottoms.  Are comfortable and fit you well.  Are closed at the toe. Do not wear sandals.  If you use a stepladder:  Make sure that it is fully opened. Do not climb a closed stepladder.  Make sure that both sides of the stepladder are locked into place.  Ask someone to hold it for you, if possible.  Clearly mark and make sure that you can see:  Any grab bars or handrails.  First and last steps.  Where the edge of each step is.  Use tools that help you move around (mobility aids) if they are needed. These include:  Canes.  Walkers.  Scooters.  Crutches.  Turn on the lights when you go into a dark area. Replace any light bulbs as soon as they burn out.  Set up your furniture so you have a clear path. Avoid moving your furniture around.  If any of your floors are uneven, fix them.  If there are any pets around you, be aware of where they are.  Review your medicines with your doctor. Some medicines can make you feel dizzy. This can increase your chance of falling. Ask your doctor what other things that you can do to help prevent falls. This information is not intended to replace advice given to you by your health care provider. Make sure you discuss any questions you have with your health care provider. Document Released: 05/29/2009 Document Revised: 01/08/2016 Document Reviewed: 09/06/2014 Elsevier Interactive Patient Education  2017 [employer].

## 2020-05-28 ENCOUNTER — Encounter: Payer: Medicare Other | Admitting: Internal Medicine

## 2020-05-28 ENCOUNTER — Ambulatory Visit (INDEPENDENT_AMBULATORY_CARE_PROVIDER_SITE_OTHER): Payer: Medicare Other | Admitting: Vascular Surgery

## 2020-06-04 ENCOUNTER — Ambulatory Visit (INDEPENDENT_AMBULATORY_CARE_PROVIDER_SITE_OTHER): Payer: Medicare Other | Admitting: Vascular Surgery

## 2020-06-05 ENCOUNTER — Encounter: Payer: Self-pay | Admitting: Internal Medicine

## 2020-06-05 ENCOUNTER — Other Ambulatory Visit: Payer: Self-pay

## 2020-06-05 ENCOUNTER — Ambulatory Visit (INDEPENDENT_AMBULATORY_CARE_PROVIDER_SITE_OTHER): Payer: Medicare Other | Admitting: Internal Medicine

## 2020-06-05 ENCOUNTER — Other Ambulatory Visit
Admission: RE | Admit: 2020-06-05 | Discharge: 2020-06-05 | Disposition: A | Discharge status: Home / Self Care | Payer: Medicare Other | Attending: Internal Medicine | Admitting: Internal Medicine

## 2020-06-05 VITALS — BP 136/82 | HR 74 | Temp 97.8°F | Ht 67.0 in | Wt 200.0 lb

## 2020-06-05 DIAGNOSIS — D6869 Other thrombophilia: Secondary | ICD-10-CM | POA: Diagnosis not present

## 2020-06-05 DIAGNOSIS — I482 Chronic atrial fibrillation, unspecified: Secondary | ICD-10-CM

## 2020-06-05 DIAGNOSIS — Z1231 Encounter for screening mammogram for malignant neoplasm of breast: Secondary | ICD-10-CM

## 2020-06-05 DIAGNOSIS — M7070 Other bursitis of hip, unspecified hip: Secondary | ICD-10-CM | POA: Insufficient documentation

## 2020-06-05 DIAGNOSIS — I10 Essential (primary) hypertension: Secondary | ICD-10-CM | POA: Insufficient documentation

## 2020-06-05 DIAGNOSIS — M7071 Other bursitis of hip, right hip: Secondary | ICD-10-CM

## 2020-06-05 DIAGNOSIS — E039 Hypothyroidism, unspecified: Secondary | ICD-10-CM | POA: Insufficient documentation

## 2020-06-05 DIAGNOSIS — Z0000 Encounter for general adult medical examination without abnormal findings: Secondary | ICD-10-CM

## 2020-06-05 DIAGNOSIS — R7303 Prediabetes: Secondary | ICD-10-CM | POA: Insufficient documentation

## 2020-06-05 DIAGNOSIS — F411 Generalized anxiety disorder: Secondary | ICD-10-CM

## 2020-06-05 LAB — CBC WITH DIFFERENTIAL/PLATELET
Abs Immature Granulocytes: 0.02 10*3/uL (ref 0.00–0.07)
Basophils Absolute: 0 10*3/uL (ref 0.0–0.1)
Basophils Relative: 1 %
Eosinophils Absolute: 0.7 10*3/uL — ABNORMAL HIGH (ref 0.0–0.5)
Eosinophils Relative: 9 %
HCT: 38.6 % (ref 36.0–46.0)
Hemoglobin: 12.7 g/dL (ref 12.0–15.0)
Immature Granulocytes: 0 %
Lymphocytes Relative: 28 %
Lymphs Abs: 2.2 10*3/uL (ref 0.7–4.0)
MCH: 29.9 pg (ref 26.0–34.0)
MCHC: 32.9 g/dL (ref 30.0–36.0)
MCV: 90.8 fL (ref 80.0–100.0)
Monocytes Absolute: 0.9 10*3/uL (ref 0.1–1.0)
Monocytes Relative: 12 %
Neutro Abs: 4.1 10*3/uL (ref 1.7–7.7)
Neutrophils Relative %: 50 %
Platelets: 239 10*3/uL (ref 150–400)
RBC: 4.25 MIL/uL (ref 3.87–5.11)
RDW: 13.5 % (ref 11.5–15.5)
WBC: 8 10*3/uL (ref 4.0–10.5)
nRBC: 0 % (ref 0.0–0.2)

## 2020-06-05 LAB — COMPREHENSIVE METABOLIC PANEL
ALT: 17 U/L (ref 0–44)
AST: 18 U/L (ref 15–41)
Albumin: 3.8 g/dL (ref 3.5–5.0)
Alkaline Phosphatase: 54 U/L (ref 38–126)
Anion gap: 8 (ref 5–15)
BUN: 14 mg/dL (ref 8–23)
CO2: 29 mmol/L (ref 22–32)
Calcium: 8.9 mg/dL (ref 8.9–10.3)
Chloride: 94 mmol/L — ABNORMAL LOW (ref 98–111)
Creatinine, Ser: 0.79 mg/dL (ref 0.44–1.00)
GFR, Estimated: >60 mL/min (ref >60)
Glucose, Bld: 106 mg/dL — ABNORMAL HIGH (ref 70–99)
Potassium: 4.6 mmol/L (ref 3.5–5.1)
Sodium: 131 mmol/L — ABNORMAL LOW (ref 135–145)
Total Bilirubin: 1.2 mg/dL (ref 0.3–1.2)
Total Protein: 6.9 g/dL (ref 6.5–8.1)

## 2020-06-05 LAB — POCT URINALYSIS DIPSTICK
Bilirubin, UA: NEGATIVE
Blood, UA: NEGATIVE
Glucose, UA: NEGATIVE
Ketones, UA: NEGATIVE
Leukocytes, UA: NEGATIVE
Nitrite, UA: NEGATIVE
Protein, UA: NEGATIVE
Spec Grav, UA: 1.015 (ref 1.010–1.025)
Urobilinogen, UA: 0.2 E.U./dL
pH, UA: 6 (ref 5.0–8.0)

## 2020-06-05 LAB — T4, FREE: Free T4: 1.24 ng/dL — ABNORMAL HIGH (ref 0.61–1.12)

## 2020-06-05 LAB — TSH: TSH: 2.636 u[IU]/mL (ref 0.350–4.500)

## 2020-06-05 MED ORDER — PREDNISONE 10 MG PO TABS: 10.0000 mg | ORAL_TABLET | ORAL | 0 refills | Status: AC

## 2020-06-05 NOTE — Progress Notes (Signed)
Date:  06/05/2020   Name:  Ariana Guzman   DOB:  [birth year]   MRN:  [id number]   Chief Complaint: Annual Exam (Breast Exam. No pap- aged out. Dexa needed.) and Leg Pain (Right leg pain - started 4 weeks ago. Tried heat, and ice. Not helping. Having trouble walking on her leg, bending, and lifting her leg. )  Ariana Guzman is a 79 y.o. female who presents today for her Complete Annual Exam. She feels fairly well. She reports exercising - none. She reports she is sleeping poorly. Breast complaints - none.  Mammogram: 07/2018 DEXA: none Colonoscopy: aged out  Immunization History  Administered Date(s) Administered  . Fluad Quad(high Dose 65+) 05/28/2019, 05/08/2020  . Influenza, High Dose Seasonal PF 08/11/2017, 04/24/2018  . PFIZER SARS-COV-2 Vaccination 10/08/2019, 10/29/2019  . Pneumococcal Conjugate-13 04/24/2018  . Pneumococcal Polysaccharide-23 05/28/2019  . Tdap 02/26/2015    Hypertension This is a chronic problem. The problem is controlled. Associated symptoms include anxiety. Pertinent negatives include no chest pain, headaches, palpitations or shortness of breath. Past treatments include diuretics, beta blockers and angiotensin blockers. The current treatment provides significant improvement. Identifiable causes of hypertension include a thyroid problem.  Thyroid Problem Presents for follow-up visit. Patient reports no anxiety, constipation, diarrhea, fatigue, palpitations or tremors. The symptoms have been stable.  Anxiety Presents for follow-up visit. Patient reports no chest pain, dizziness, nervous/anxious behavior, palpitations or shortness of breath. Symptoms occur most days. The severity of symptoms is moderate.    Diabetes She presents for her follow-up diabetic visit. She has type 2 diabetes mellitus. Disease course: gradually worsening. Pertinent negatives for hypoglycemia include no dizziness, headaches, nervousness/anxiousness or tremors. Pertinent negatives  for diabetes include no chest pain, no fatigue, no polydipsia, no polyuria and no weakness. Current diabetic treatment includes diet. An ACE inhibitor/angiotensin II receptor blocker is being taken.  Back Pain This is a recurrent problem. The current episode started 1 to 4 weeks ago. The problem occurs constantly. The problem has been gradually improving since onset. The pain is present in the gluteal. The pain radiates to the right knee and right thigh. The pain is moderate. The symptoms are aggravated by twisting and bending. Pertinent negatives include no abdominal pain, bladder incontinence, bowel incontinence, chest pain, dysuria, fever, headaches, numbness, paresis or weakness. Risk factors: feels similar to previous bouts of sciatica in the past. She has tried ice, heat and analgesics for the symptoms. The treatment provided mild relief.    Lab Results  Component Value Date   CREATININE 0.79 06/05/2020   BUN 14 06/05/2020   NA 131 (L) 06/05/2020   K 4.6 06/05/2020   CL 94 (L) 06/05/2020   CO2 29 06/05/2020   Lab Results  Component Value Date   CHOL 174 05/28/2019   HDL 59 05/28/2019   LDLCALC 99 05/28/2019   TRIG 84 05/28/2019   CHOLHDL 2.9 05/28/2019   Lab Results  Component Value Date   TSH 1.540 05/28/2019   Lab Results  Component Value Date   HGBA1C 6.6 (H) 04/03/2020   Lab Results  Component Value Date   WBC 8.0 06/05/2020   HGB 12.7 06/05/2020   HCT 38.6 06/05/2020   MCV 90.8 06/05/2020   PLT 239 06/05/2020   Lab Results  Component Value Date   ALT 17 06/05/2020   AST 18 06/05/2020   ALKPHOS 54 06/05/2020   BILITOT 1.2 06/05/2020     Review of Systems  Constitutional: Negative for chills, fatigue  and fever.  HENT: Negative for congestion, hearing loss, tinnitus, trouble swallowing and voice change.   Eyes: Negative for visual disturbance.  Respiratory: Negative for cough, chest tightness, shortness of breath and wheezing.   Cardiovascular: Negative for  chest pain, palpitations and leg swelling.  Gastrointestinal: Negative for abdominal pain, bowel incontinence, constipation, diarrhea and vomiting.  Endocrine: Negative for polydipsia and polyuria.  Genitourinary: Negative for bladder incontinence, dysuria, frequency, genital sores, vaginal bleeding and vaginal discharge.  Musculoskeletal: Positive for back pain (and right leg sciatica) and gait problem (uses cane). Negative for arthralgias and joint swelling.  Skin: Negative for color change and rash.  Neurological: Negative for dizziness, tremors, weakness, light-headedness, numbness and headaches.  Hematological: Negative for adenopathy. Does not bruise/bleed easily.  Psychiatric/Behavioral: Negative for dysphoric mood and sleep disturbance. The patient is not nervous/anxious.     Patient Active Problem List   Diagnosis Date Noted  . Bursitis of hip 06/05/2020  . 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 Hyponatremia 04/05/2020  . Environmental and seasonal allergies 04/03/2020  . Pre-diabetes 05/29/2018  . Primary insomnia 09/15/2017  . Anxiety, generalized 09/15/2017  . Essential hypertension 09/09/2017  . 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 Bilateral lower extremity edema 09/09/2017  . Current use of long term anticoagulation 07/28/2017  . 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 Otitis externa, chronic 12/03/2015  . Obstructive sleep apnea 10/07/2015  . Asthma, persistent controlled 09/25/2015  . Acquired hypothyroidism 02/29/2012  . Chronic atrial fibrillation ([place]) 02/29/2012  . Hyperlipidemia 02/29/2012    Allergies  Allergen Reactions  . Tramadol Shortness Of Breath  . Meloxicam Rash    Past Surgical History:  Procedure Laterality Date  . ABDOMINAL HYSTERECTOMY  1991   uterus only- still has cervix - done for prolapse  . COLONOSCOPY  2009   normal  . TOE SURGERY      Social History   Tobacco Use  .  Smoking status: Former Smoker    Packs/day: 0.50    Years: 29.00    Pack years: 14.50    Types: Cigarettes    Quit date: 09/28/1989    Years since quitting: 30.7  . Smokeless tobacco: Never Used  . Tobacco comment: smoking cessation materials not required  Vaping Use  . Vaping Use: Never used  Substance Use Topics  . Alcohol use: No  . Drug use: No     Medication list has been reviewed and updated.  Current Meds  Medication Sig  . albuterol (PROVENTIL) (2.5 MG/3ML) 0.083% nebulizer solution Take 2.5 mg by nebulization every 6 (six) hours as needed.  . ALPRAZolam (XANAX) 0.25 MG tablet TAKE 1 TABLET BY MOUTH AT BEDTIME  . Ascorbic Acid (VITAMIN C) 1000 MG tablet Take 1,000 mg by mouth daily.  . Calcium Carb-Cholecalciferol [phone] MG-UNIT TABS Take by mouth.  . cetirizine (ZYRTEC) 10 MG tablet Take 10 mg by mouth daily.  . cholecalciferol (VITAMIN D) 1000 units tablet Take 1,000 Units by mouth daily.  . Fluticasone-Salmeterol (ADVAIR) 250-50 MCG/DOSE AEPB Inhale 1 puff into the lungs 2 (two) times daily.  Marland Kitchen latanoprost (XALATAN) 0.005 % ophthalmic solution 1 drop at bedtime.  Marland Kitchen levothyroxine (SYNTHROID) 75 MCG tablet TAKE 1 TABLET BY MOUTH DAILY BEFORE BREAKFAST  . losartan (COZAAR) 100 MG tablet Take 1 tablet (100 mg total) by mouth daily.  Marland Kitchen MAGNESIUM-ZINC PO Take 500 mg by mouth at bedtime as needed.   . metoprolol tartrate (LOPRESSOR) 25 MG tablet Take  1 tablet (25 mg total) by mouth 2 (two) times daily.  . multivitamin-lutein (OCUVITE-LUTEIN) CAPS capsule Take 1 capsule by mouth daily.  . Omega-3 Fatty Acids (FISH OIL PO) Take by mouth.  . Rivaroxaban (XARELTO) 15 MG TABS tablet Take 15 mg by mouth daily with supper.   Marland Kitchen spironolactone (ALDACTONE) 25 MG tablet Take 1 tablet (25 mg total) by mouth daily.    PHQ 2/9 Scores 05/12/2020 04/03/2020 09/27/2019 09/04/2019  PHQ - 2 Score 0 0 0 0  PHQ- 9 Score - 2 - -    GAD 7 : Generalized Anxiety Score 04/03/2020  Nervous,  Anxious, on Edge 0  Control/stop worrying 0  Worry too much - different things 0  Trouble relaxing 0  Restless 0  Easily annoyed or irritable 0  Afraid - awful might happen 0  Total GAD 7 Score 0  Anxiety Difficulty Not difficult at all    BP Readings from Last 3 Encounters:  06/05/20 136/82  05/08/20 (!) 152/94  04/30/20 (!) 163/77    Physical Exam Vitals and nursing note reviewed.  Constitutional:      General: She is not in acute distress.    Appearance: She is well-developed.  HENT:     Head: Normocephalic and atraumatic.     Right Ear: Tympanic membrane and ear canal normal.     Left Ear: Tympanic membrane and ear canal normal.     Nose:     Right Sinus: No maxillary sinus tenderness.     Left Sinus: No maxillary sinus tenderness.  Eyes:     General: No scleral icterus.       Right eye: No discharge.        Left eye: No discharge.     Conjunctiva/sclera: Conjunctivae normal.  Neck:     Thyroid: No thyromegaly.     Vascular: No carotid bruit.  Cardiovascular:     Rate and Rhythm: Normal rate. Rhythm regularly irregular.     Pulses: Normal pulses.     Heart sounds: Normal heart sounds. No murmur heard.   Pulmonary:     Effort: Pulmonary effort is normal. No respiratory distress.     Breath sounds: No wheezing.  Chest:     Breasts:        Right: No mass, nipple discharge, skin change or tenderness.        Left: No mass, nipple discharge, skin change or tenderness.  Abdominal:     General: Bowel sounds are normal.     Palpations: Abdomen is soft.     Tenderness: There is no abdominal tenderness.  Musculoskeletal:     Cervical back: Normal range of motion. No erythema.     Lumbar back: No spasms, tenderness or bony tenderness. Positive right straight leg raise test. Negative left straight leg raise test.     Right hip: Tenderness present. Decreased range of motion.     Left hip: No tenderness. Normal range of motion.     Right knee: Bony tenderness present.  No effusion, erythema or crepitus. Decreased range of motion.     Left knee: Bony tenderness present. No effusion, erythema or crepitus. Decreased range of motion.     Right lower leg: No edema.     Left lower leg: No edema.  Lymphadenopathy:     Cervical: No cervical adenopathy.  Skin:    General: Skin is warm and dry.     Findings: Ecchymosis present. No rash.  Neurological:     Mental Status:  She is alert and oriented to person, place, and time.     Cranial Nerves: No cranial nerve deficit.     Sensory: No sensory deficit.     Deep Tendon Reflexes: Reflexes are normal and symmetric.  Psychiatric:        Attention and Perception: Attention normal.        Mood and Affect: Mood normal.     Wt Readings from Last 3 Encounters:  06/05/20 200 lb (90.7 kg)  05/08/20 199 lb (90.3 kg)  04/30/20 198 lb (89.8 kg)    BP 136/82   Pulse 74   Temp 97.8 F (36.6 C) (Oral)   Ht 5\' 7"  (1.702 m)   Wt 200 lb (90.7 kg)   SpO2 98%   BMI 31.32 kg/m   Assessment and Plan: 1. Annual physical exam Normal exam for age except weight Continue healthy diet choices, exercise as able - POCT urinalysis dipstick  2. Encounter for screening mammogram for breast cancer Schedule at [facility]  3. Essential hypertension Clinically stable exam with well controlled BP on losartan, metoprolol and spironolactone. Tolerating medications without side effects at this time. Pt to continue current regimen and low sodium diet;  - CBC with Differential/Platelet - Comprehensive metabolic panel  4. Acquired hypothyroidism supplemented - TSH + free T4  5. 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 Chronic atrial fibrillation (HCC) Rate controlled today On Xarelto 15 mg daily Some senile ecchymoses but no other bleeding issues  7. Anxiety, generalized stable  8. Bursitis of other bursa of right hip Continue ice or heat and  topical rubs If no benefit from steroid taper, consider Ortho referral - predniSONE (DELTASONE) 10 MG tablet; Take 1 tablet (10 mg total) by mouth as directed for 6 days. Take 6,5,4,3,2,1 then stop  Dispense: 21 tablet; Refill: 0  9. 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 Any errors are unintentional.  Halina Maidens, MD [facility]  06/05/2020

## 2020-06-06 ENCOUNTER — Telehealth: Payer: Self-pay

## 2020-06-06 LAB — HEMOGLOBIN A1C
Hgb A1c MFr Bld: 6.3 % — ABNORMAL HIGH (ref 4.8–5.6)
Mean Plasma Glucose: 134 mg/dL

## 2020-06-06 NOTE — Telephone Encounter (Signed)
Called pt gave her the number to schedule mammogram. [phone]  KP

## 2020-06-06 NOTE — Telephone Encounter (Signed)
-----   Message from Clista Bernhardt, [place] sent at 06/06/2020 10:12 AM EDT ----- Hey girl, can you call this patient and give her the mammo card # to schedule her mammogram tha was ordered yesterday.CM ----- Message ----- From: Glean Hess, MD Sent: 06/05/2020  12:31 PM EDT To: Clista Bernhardt, CMA  Please call patient with number to schedule her mammogram here in [place].  I am not sure that she got the card today.

## 2020-06-18 DIAGNOSIS — H40003 Preglaucoma, unspecified, bilateral: Secondary | ICD-10-CM | POA: Diagnosis not present

## 2020-06-19 ENCOUNTER — Telehealth: Payer: Self-pay | Admitting: Internal Medicine

## 2020-06-19 NOTE — Telephone Encounter (Signed)
Patient is calling to report that she is not feeling better after taking all of the predniSONE (DELTASONE) 10 MG tablet [[id number]] ENDED Patient has put ice and heat on her right leg.  Patient takes two tylenol at night. Patient would like to know is there anything else that Dr. Army Melia could prescribe? Any pain meds? Please advise [phone]

## 2020-06-20 ENCOUNTER — Other Ambulatory Visit: Payer: Self-pay

## 2020-06-20 DIAGNOSIS — M79606 Pain in leg, unspecified: Secondary | ICD-10-CM

## 2020-06-20 DIAGNOSIS — M25559 Pain in unspecified hip: Secondary | ICD-10-CM

## 2020-06-20 NOTE — Telephone Encounter (Signed)
Please Advise. Called pt told pt Dr. Army Melia is out of the office will return Monday 06/23/20. Told pt Dr. Jacinto Reap does not prescribe pain meds. Pt verbalized understanding. Pt will wait until she hears something from someone on Monday on what she can do. Told Pt to go to ER or UC if pain gets to be too much for her.  KP

## 2020-06-21 NOTE — Telephone Encounter (Signed)
Referral placed to [facility].

## 2020-07-01 DIAGNOSIS — H2513 Age-related nuclear cataract, bilateral: Secondary | ICD-10-CM | POA: Diagnosis not present

## 2020-07-02 DIAGNOSIS — M25551 Pain in right hip: Secondary | ICD-10-CM | POA: Diagnosis not present

## 2020-07-02 DIAGNOSIS — M545 Low back pain, unspecified: Secondary | ICD-10-CM | POA: Diagnosis not present

## 2020-07-02 DIAGNOSIS — G8929 Other chronic pain: Secondary | ICD-10-CM | POA: Diagnosis not present

## 2020-07-02 DIAGNOSIS — M1611 Unilateral primary osteoarthritis, right hip: Secondary | ICD-10-CM | POA: Diagnosis not present

## 2020-07-07 ENCOUNTER — Other Ambulatory Visit: Payer: Self-pay | Admitting: Internal Medicine

## 2020-07-07 DIAGNOSIS — I10 Essential (primary) hypertension: Secondary | ICD-10-CM

## 2020-07-07 NOTE — Telephone Encounter (Signed)
Requested Prescriptions  Pending Prescriptions Disp Refills   metoprolol tartrate (LOPRESSOR) 25 MG tablet [Pharmacy Med Name: Metoprolol Tartrate 25 MG Oral Tablet] 180 tablet 1    Sig: Take 1 tablet by mouth twice daily     Cardiovascular:  Beta Blockers Passed - 07/07/2020  4:31 PM      Passed - Last BP in normal range    BP Readings from Last 1 Encounters:  06/05/20 136/82         Passed - Last Heart Rate in normal range    Pulse Readings from Last 1 Encounters:  06/05/20 74         Passed - Valid encounter within last 6 months    Recent Outpatient Visits          1 month ago Annual physical exam   [facility] Glean Hess, MD   2 months ago Essential hypertension   [place], MD   3 months ago Essential hypertension   [place], Laura H, MD   3 months ago Essential hypertension   [place], Laura H, MD   9 months ago Essential hypertension   [facility] Glean Hess, MD      Future Appointments            In 4 months Army Melia Jesse Sans, MD [facility] [place]   In 11 months Army Melia Jesse Sans, MD [facility]

## 2020-07-08 DIAGNOSIS — Q828 Other specified congenital malformations of skin: Secondary | ICD-10-CM | POA: Diagnosis not present

## 2020-07-08 DIAGNOSIS — M7741 Metatarsalgia, right foot: Secondary | ICD-10-CM | POA: Diagnosis not present

## 2020-07-08 DIAGNOSIS — D2372 Other benign neoplasm of skin of left lower limb, including hip: Secondary | ICD-10-CM | POA: Diagnosis not present

## 2020-07-08 DIAGNOSIS — D2371 Other benign neoplasm of skin of right lower limb, including hip: Secondary | ICD-10-CM | POA: Diagnosis not present

## 2020-07-08 DIAGNOSIS — L851 Acquired keratosis [keratoderma] palmaris et plantaris: Secondary | ICD-10-CM | POA: Diagnosis not present

## 2020-07-21 DIAGNOSIS — M25551 Pain in right hip: Secondary | ICD-10-CM | POA: Diagnosis not present

## 2020-07-21 DIAGNOSIS — M5441 Lumbago with sciatica, right side: Secondary | ICD-10-CM | POA: Diagnosis not present

## 2020-08-01 ENCOUNTER — Encounter: Payer: Self-pay | Admitting: Internal Medicine

## 2020-08-01 DIAGNOSIS — M25551 Pain in right hip: Secondary | ICD-10-CM | POA: Diagnosis not present

## 2020-08-04 ENCOUNTER — Telehealth: Payer: Self-pay

## 2020-08-04 ENCOUNTER — Other Ambulatory Visit: Payer: Self-pay | Admitting: Internal Medicine

## 2020-08-04 DIAGNOSIS — I10 Essential (primary) hypertension: Secondary | ICD-10-CM

## 2020-08-04 NOTE — Telephone Encounter (Signed)
Spoke to pt and gave message from [place], she said she was doing better today.

## 2020-08-04 NOTE — Telephone Encounter (Signed)
Copied from [place] [id number]. Topic: General - Inquiry >> Aug 01, 2020  1:31 PM Greggory Keen D wrote: Reason for CRM: pt called saying that after the cortisone injection today her blood pressure went up a little.  CB#  [phone]  [phone]

## 2020-08-05 DIAGNOSIS — J4551 Severe persistent asthma with (acute) exacerbation: Secondary | ICD-10-CM | POA: Diagnosis not present

## 2020-08-05 DIAGNOSIS — I10 Essential (primary) hypertension: Secondary | ICD-10-CM | POA: Diagnosis not present

## 2020-08-06 ENCOUNTER — Ambulatory Visit (INDEPENDENT_AMBULATORY_CARE_PROVIDER_SITE_OTHER): Payer: Medicare Other | Admitting: Internal Medicine

## 2020-08-06 ENCOUNTER — Ambulatory Visit: Payer: Self-pay

## 2020-08-06 ENCOUNTER — Encounter: Payer: Self-pay | Admitting: Internal Medicine

## 2020-08-06 ENCOUNTER — Other Ambulatory Visit: Payer: Self-pay

## 2020-08-06 VITALS — BP 164/92 | HR 86 | Temp 98.0°F | Ht 67.0 in | Wt 197.0 lb

## 2020-08-06 DIAGNOSIS — I10 Essential (primary) hypertension: Secondary | ICD-10-CM | POA: Diagnosis not present

## 2020-08-06 NOTE — Progress Notes (Signed)
Date:  08/06/2020   Name:  Ariana Guzman   DOB:  24-Jun-1941   MRN:  [id number]   Chief Complaint: Hypertension (headache)  Hypertension This is a chronic problem. The problem has been rapidly worsening since onset. Associated symptoms include headaches. Pertinent negatives include no blurred vision, chest pain, palpitations, peripheral edema or shortness of breath. Agents associated with hypertension include steroids. Past treatments include beta blockers, diuretics and angiotensin blockers. The current treatment provides moderate improvement. There are no compliance problems.  There is no history of kidney disease, CAD/MI or CVA.  BP noted to be elevated before and after a recent cortisone injection.   Lab Results  Component Value Date   CREATININE 0.79 06/05/2020   BUN 14 06/05/2020   NA 131 (L) 06/05/2020   K 4.6 06/05/2020   CL 94 (L) 06/05/2020   CO2 29 06/05/2020   Lab Results  Component Value Date   CHOL 174 05/28/2019   HDL 59 05/28/2019   LDLCALC 99 05/28/2019   TRIG 84 05/28/2019   CHOLHDL 2.9 05/28/2019   Lab Results  Component Value Date   TSH 2.636 06/05/2020   Lab Results  Component Value Date   HGBA1C 6.3 (H) 06/05/2020   Lab Results  Component Value Date   WBC 8.0 06/05/2020   HGB 12.7 06/05/2020   HCT 38.6 06/05/2020   MCV 90.8 06/05/2020   PLT 239 06/05/2020   Lab Results  Component Value Date   ALT 17 06/05/2020   AST 18 06/05/2020   ALKPHOS 54 06/05/2020   BILITOT 1.2 06/05/2020     Review of Systems  Constitutional: Negative for chills, fatigue and fever.  Eyes: Negative for blurred vision.  Respiratory: Negative for chest tightness, shortness of breath and wheezing.   Cardiovascular: Positive for leg swelling (mild ankle edema). Negative for chest pain and palpitations.  Neurological: Positive for headaches. Negative for dizziness, tremors and syncope.    Patient Active Problem List   Diagnosis Date Noted  . Bursitis of hip  06/05/2020  . 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 Hyponatremia 04/05/2020  . Environmental and seasonal allergies 04/03/2020  . Pre-diabetes 05/29/2018  . Primary insomnia 09/15/2017  . Anxiety, generalized 09/15/2017  . Essential hypertension 09/09/2017  . 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 Bilateral lower extremity edema 09/09/2017  . Current use of long term anticoagulation 07/28/2017  . 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 Otitis externa, chronic 12/03/2015  . Obstructive sleep apnea 10/07/2015  . Asthma, persistent controlled 09/25/2015  . Acquired hypothyroidism 02/29/2012  . Chronic atrial fibrillation ([place]) 02/29/2012  . Hyperlipidemia 02/29/2012    Allergies  Allergen Reactions  . Tramadol Shortness Of Breath  . Meloxicam Rash    Past Surgical History:  Procedure Laterality Date  . ABDOMINAL HYSTERECTOMY  1991   uterus only- still has cervix - done for prolapse  . COLONOSCOPY  2009   normal  . TOE SURGERY      Social History   Tobacco Use  . Smoking status: Former Smoker    Packs/day: 0.50    Years: 29.00    Pack years: 14.50    Types: Cigarettes    Quit date: 09/28/1989    Years since quitting: 30.8  . Smokeless tobacco: Never Used  . Tobacco comment: smoking cessation materials not required  Vaping Use  . Vaping Use: Never used  Substance Use Topics  . Alcohol use: No  . Drug  use: No     Medication list has been reviewed and updated.  Current Meds  Medication Sig  . albuterol (PROVENTIL) (2.5 MG/3ML) 0.083% nebulizer solution Take 2.5 mg by nebulization every 6 (six) hours as needed.  . ALPRAZolam (XANAX) 0.25 MG tablet TAKE 1 TABLET BY MOUTH AT BEDTIME  . Ascorbic Acid (VITAMIN C) 1000 MG tablet Take 1,000 mg by mouth daily.  . Calcium Carb-Cholecalciferol [id number] MG-UNIT TABS Take by mouth.  . cetirizine (ZYRTEC) 10 MG tablet Take 10 mg by mouth daily.  . cholecalciferol  (VITAMIN D) 1000 units tablet Take 1,000 Units by mouth daily.  . Fluticasone-Salmeterol (ADVAIR) 250-50 MCG/DOSE AEPB Inhale 1 puff into the lungs 2 (two) times daily.  Marland Kitchen latanoprost (XALATAN) 0.005 % ophthalmic solution 1 drop at bedtime.  Marland Kitchen levothyroxine (SYNTHROID) 75 MCG tablet TAKE 1 TABLET BY MOUTH DAILY BEFORE BREAKFAST  . losartan (COZAAR) 100 MG tablet Take 1 tablet (100 mg total) by mouth daily.  Marland Kitchen MAGNESIUM-ZINC PO Take 500 mg by mouth at bedtime as needed.   Marland Kitchen MELATONIN PO Take by mouth.  . metoprolol tartrate (LOPRESSOR) 25 MG tablet Take 1 tablet by mouth twice daily  . montelukast (SINGULAIR) 10 MG tablet Take 10 mg by mouth daily.  . multivitamin-lutein (OCUVITE-LUTEIN) CAPS capsule Take 1 capsule by mouth daily.  . Omega-3 Fatty Acids (FISH OIL PO) Take by mouth.  . Rivaroxaban (XARELTO) 15 MG TABS tablet Take 15 mg by mouth daily with supper.   Marland Kitchen spironolactone (ALDACTONE) 25 MG tablet Take 1 tablet by mouth once daily (Patient taking differently: No sig reported)    PHQ 2/9 Scores 08/06/2020 05/12/2020 04/03/2020 09/27/2019  PHQ - 2 Score 1 0 0 0  PHQ- 9 Score 1 - 2 -    GAD 7 : Generalized Anxiety Score 08/06/2020 04/03/2020  Nervous, Anxious, on Edge 1 0  Control/stop worrying 1 0  Worry too much - different things 1 0  Trouble relaxing 0 0  Restless 0 0  Easily annoyed or irritable 0 0  Afraid - awful might happen 0 0  Total GAD 7 Score 3 0  Anxiety Difficulty - Not difficult at all    BP Readings from Last 3 Encounters:  08/06/20 (!) 164/92  06/05/20 136/82  05/08/20 (!) 152/94    Physical Exam Vitals and nursing note reviewed.  Constitutional:      General: She is not in acute distress.    Appearance: She is well-developed.  HENT:     Head: Normocephalic and atraumatic.  Cardiovascular:     Rate and Rhythm: Normal rate. Rhythm irregular.     Pulses: Normal pulses.  Pulmonary:     Effort: Pulmonary effort is normal. No respiratory distress.      Breath sounds: No wheezing or rhonchi.  Musculoskeletal:        General: Normal range of motion.     Cervical back: Normal range of motion.     Right lower leg: Edema (trace ankle) present.     Left lower leg: Edema (trace ankle) present.  Lymphadenopathy:     Cervical: No cervical adenopathy.  Skin:    General: Skin is warm and dry.     Capillary Refill: Capillary refill takes less than 2 seconds.     Findings: No rash.  Neurological:     General: No focal deficit present.     Mental Status: She is alert and oriented to person, place, and time.  Psychiatric:  Mood and Affect: Mood and affect and mood normal.        Behavior: Behavior normal.     Wt Readings from Last 3 Encounters:  08/06/20 197 lb (89.4 kg)  06/05/20 200 lb (90.7 kg)  05/08/20 199 lb (90.3 kg)    BP (!) 164/92   Pulse 86   Temp 98 F (36.7 C) (Oral)   Ht 5\' 7"  (1.702 m)   Wt 197 lb (89.4 kg)   SpO2 97%   BMI 30.85 kg/m   Assessment and Plan: 1. Essential hypertension BP not controlled. Will continue losartan 100 mg Increase spironolactone to 50 mg daily Add a third dose of metoprolol 25 mg per day Follow up in one week   Partially dictated using . Any errors are unintentional.  Animal nutritionist, MD [facility]  08/06/2020

## 2020-08-06 NOTE — Telephone Encounter (Signed)
Pt. Reports her BP has been elevated this week. Saw her pulmonology doctor at [facility] yesterday and BP was 199/100. States " he though I needed to see Dr. Army Melia. I know she said the BP could be high due to the cortisone injection, but I don't want something to happen to me." No availability in the practice . Spoke with Estill Bamberg in the practice and will forward triage. Please advise pt.  Reason for Disposition  [0] Systolic BP  >= 947 OR Diastolic >= 096  AND [2] having NO cardiac or neurologic symptoms  Answer Assessment - Initial Assessment Questions 1. BLOOD PRESSURE: "What is the blood pressure?" "Did you take at least two measurements 5 minutes apart?"     199/100 2. ONSET: "When did you take your blood pressure?"     Yesterday 3. HOW: "How did you obtain the blood pressure?" (e.g., visiting nurse, automatic home BP monitor)     Doctor's office 4. HISTORY: "Do you have a history of high blood pressure?"     Yes 5. MEDICATIONS: "Are you taking any medications for blood pressure?" "Have you missed any doses recently?"     Yes 6. OTHER SYMPTOMS: "Do you have any symptoms?" (e.g., headache, chest pain, blurred vision, difficulty breathing, weakness)     Mild headache 7. PREGNANCY: "Is there any chance you are pregnant?" "When was your last menstrual period?"     No  Protocols used: BLOOD PRESSURE - HIGH-A-AH

## 2020-08-06 NOTE — Telephone Encounter (Signed)
Called and spoke with patient. She said she is not having any chest pain or shortness of breathe that is abnormal for her outside of asthma. She is just concerned about her BP being so elevated. Scheduled patient for an appointment this morning at 1120. She said she will be here to check in between 11am and 11:10.  Ariana Guzman

## 2020-08-06 NOTE — Patient Instructions (Signed)
Take 2 Spironolactone in the morning  Add a third dose of metoprolol 25 mg in the middle of the day.  Continue Losartan 100 mg.

## 2020-08-14 ENCOUNTER — Ambulatory Visit (INDEPENDENT_AMBULATORY_CARE_PROVIDER_SITE_OTHER): Payer: Medicare Other | Admitting: Internal Medicine

## 2020-08-14 ENCOUNTER — Encounter: Payer: Self-pay | Admitting: Internal Medicine

## 2020-08-14 ENCOUNTER — Other Ambulatory Visit: Payer: Self-pay

## 2020-08-14 VITALS — BP 140/62 | HR 68 | Temp 98.1°F | Ht 67.0 in | Wt 195.0 lb

## 2020-08-14 DIAGNOSIS — I10 Essential (primary) hypertension: Secondary | ICD-10-CM | POA: Diagnosis not present

## 2020-08-14 DIAGNOSIS — E871 Hypo-osmolality and hyponatremia: Secondary | ICD-10-CM | POA: Diagnosis not present

## 2020-08-14 MED ORDER — METOPROLOL TARTRATE 25 MG PO TABS: ORAL_TABLET | ORAL | 1 refills | Status: DC

## 2020-08-14 MED ORDER — SPIRONOLACTONE 25 MG PO TABS: 25.0000 mg | ORAL_TABLET | Freq: Two times a day (BID) | ORAL | 1 refills | Status: DC

## 2020-08-14 NOTE — Progress Notes (Signed)
Date:  08/14/2020   Name:  Ariana Guzman   DOB:  12/31/1940   MRN:  [id number]   Chief Complaint: Hypertension  Hypertension This is a chronic problem. The problem has been gradually improving since onset. Pertinent negatives include no chest pain, headaches, palpitations or shortness of breath. Past treatments include beta blockers, diuretics and angiotensin blockers (metoprolol increased to tid and spironolactone to twice a day). There are no compliance problems.   She has tolerated the dose change well.  She is planning to purchase a BP cuff to monitor at home.  Lab Results  Component Value Date   CREATININE 0.79 06/05/2020   BUN 14 06/05/2020   NA 131 (L) 06/05/2020   K 4.6 06/05/2020   CL 94 (L) 06/05/2020   CO2 29 06/05/2020   Lab Results  Component Value Date   CHOL 174 05/28/2019   HDL 59 05/28/2019   LDLCALC 99 05/28/2019   TRIG 84 05/28/2019   CHOLHDL 2.9 05/28/2019   Lab Results  Component Value Date   TSH 2.636 06/05/2020   Lab Results  Component Value Date   HGBA1C 6.3 (H) 06/05/2020   Lab Results  Component Value Date   WBC 8.0 06/05/2020   HGB 12.7 06/05/2020   HCT 38.6 06/05/2020   MCV 90.8 06/05/2020   PLT 239 06/05/2020   Lab Results  Component Value Date   ALT 17 06/05/2020   AST 18 06/05/2020   ALKPHOS 54 06/05/2020   BILITOT 1.2 06/05/2020     Review of Systems  Constitutional: Negative for chills and fatigue.  Respiratory: Negative for chest tightness, shortness of breath and wheezing.   Cardiovascular: Positive for leg swelling. Negative for chest pain and palpitations.  Gastrointestinal: Negative for abdominal pain.  Neurological: Negative for dizziness, light-headedness and headaches.    Patient Active Problem List   Diagnosis Date Noted  . Bursitis of hip 06/05/2020  . Acquired thrombophilia (HCC) 06/05/2020  . Hyponatremia 04/05/2020  . Environmental and seasonal allergies 04/03/2020  . Pre-diabetes 05/29/2018  .  Primary insomnia 09/15/2017  . Anxiety, generalized 09/15/2017  . Essential hypertension 09/09/2017  . 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 Bilateral lower extremity edema 09/09/2017  . Current use of long term anticoagulation 07/28/2017  . 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 Otitis externa, chronic 12/03/2015  . Obstructive sleep apnea 10/07/2015  . Asthma, persistent controlled 09/25/2015  . Acquired hypothyroidism 02/29/2012  . Chronic atrial fibrillation (HCC) 02/29/2012  . Hyperlipidemia 02/29/2012    Allergies  Allergen Reactions  . Tramadol Shortness Of Breath  . Meloxicam Rash    Past Surgical History:  Procedure Laterality Date  . ABDOMINAL HYSTERECTOMY  1991   uterus only- still has cervix - done for prolapse  . COLONOSCOPY  2009   normal  . TOE SURGERY      Social History   Tobacco Use  . Smoking status: Former Smoker    Packs/day: 0.50    Years: 29.00    Pack years: 14.50    Types: Cigarettes    Quit date: 09/28/1989    Years since quitting: 30.8  . Smokeless tobacco: Never Used  . Tobacco comment: smoking cessation materials not required  Vaping Use  . Vaping Use: Never used  Substance Use Topics  . Alcohol use: No  . Drug use: No     Medication list has been reviewed and updated.  Current Meds  Medication Sig  .  albuterol (PROVENTIL) (2.5 MG/3ML) 0.083% nebulizer solution Take 2.5 mg by nebulization every 6 (six) hours as needed.  . ALPRAZolam (XANAX) 0.25 MG tablet TAKE 1 TABLET BY MOUTH AT BEDTIME  . Ascorbic Acid (VITAMIN C) 1000 MG tablet Take 1,000 mg by mouth daily.  . Calcium Carb-Cholecalciferol [phone] MG-UNIT TABS Take by mouth.  . cetirizine (ZYRTEC) 10 MG tablet Take 10 mg by mouth daily.  . cholecalciferol (VITAMIN D) 1000 units tablet Take 1,000 Units by mouth daily.  . Fluticasone-Salmeterol (ADVAIR) 250-50 MCG/DOSE AEPB Inhale 1 puff into the lungs 2 (two) times daily.   Marland Kitchen latanoprost (XALATAN) 0.005 % ophthalmic solution 1 drop at bedtime.  Marland Kitchen levothyroxine (SYNTHROID) 75 MCG tablet TAKE 1 TABLET BY MOUTH DAILY BEFORE BREAKFAST  . losartan (COZAAR) 100 MG tablet Take 1 tablet (100 mg total) by mouth daily.  Marland Kitchen MAGNESIUM-ZINC PO Take 500 mg by mouth at bedtime as needed.   Marland Kitchen MELATONIN PO Take by mouth.  . montelukast (SINGULAIR) 10 MG tablet Take 10 mg by mouth daily.  . multivitamin-lutein (OCUVITE-LUTEIN) CAPS capsule Take 1 capsule by mouth daily.  . Omega-3 Fatty Acids (FISH OIL PO) Take by mouth.  . Rivaroxaban (XARELTO) 15 MG TABS tablet Take 15 mg by mouth daily with supper.   . [DISCONTINUED] Fluticasone-Salmeterol (ADVAIR) 500-50 MCG/DOSE AEPB 1 inhalation every 12 hours. Do NOT use more than twice a day. Rinse mouth after use.  . [DISCONTINUED] metoprolol tartrate (LOPRESSOR) 25 MG tablet Take 1 tablet by mouth twice daily (Patient taking differently: Three times a day)  . [DISCONTINUED] spironolactone (ALDACTONE) 25 MG tablet Take 1 tablet by mouth once daily (Patient taking differently: in the morning and at bedtime.)    PHQ 2/9 Scores 08/14/2020 08/06/2020 05/12/2020 04/03/2020  PHQ - 2 Score 0 1 0 0  PHQ- 9 Score 0 1 - 2    GAD 7 : Generalized Anxiety Score 08/14/2020 08/06/2020 04/03/2020  Nervous, Anxious, on Edge 1 1 0  Control/stop worrying 1 1 0  Worry too much - different things 1 1 0  Trouble relaxing 0 0 0  Restless 0 0 0  Easily annoyed or irritable 0 0 0  Afraid - awful might happen 0 0 0  Total GAD 7 Score 3 3 0  Anxiety Difficulty - - Not difficult at all    BP Readings from Last 3 Encounters:  08/14/20 140/62  08/06/20 (!) 164/92  06/05/20 136/82    Physical Exam Constitutional:      Appearance: Normal appearance.  Cardiovascular:     Rate and Rhythm: Normal rate. Rhythm irregularly irregular.     Pulses: Normal pulses.     Heart sounds: No murmur heard.     Comments: Trace LE edema - TED hose in place Pulmonary:      Effort: Pulmonary effort is normal. No respiratory distress.     Breath sounds: No wheezing or rhonchi.  Musculoskeletal:     Cervical back: Normal range of motion.  Lymphadenopathy:     Cervical: No cervical adenopathy.  Neurological:     Mental Status: She is alert.  Psychiatric:        Attention and Perception: Attention normal.     Wt Readings from Last 3 Encounters:  08/14/20 195 lb (88.5 kg)  08/06/20 197 lb (89.4 kg)  06/05/20 200 lb (90.7 kg)    BP 140/62   Pulse 68   Temp 98.1 F (36.7 C) (Oral)   Ht 5\' 7"  (1.702 m)  Wt 195 lb (88.5 kg)   SpO2 98%   BMI 30.54 kg/m   Assessment and Plan: 1. Essential hypertension Much improved with dose adjustments Continue current regimen - spironolactone (ALDACTONE) 25 MG tablet; Take 1 tablet (25 mg total) by mouth 2 (two) times daily.  Dispense: 180 tablet; Refill: 1 - metoprolol tartrate (LOPRESSOR) 25 MG tablet; Three times a day  Dispense: 270 tablet; Refill: 1  2. Hyponatremia Repeat labs since increasing diuretic Pt to continue to limit fluid intake to 6 or 7 glasses per day - Basic metabolic panel   Partially dictated using Editor, commissioning. Any errors are unintentional.  Halina Maidens, MD [facility]  08/14/2020

## 2020-08-15 LAB — BASIC METABOLIC PANEL
BUN/Creatinine Ratio: 14 (ref 12–28)
BUN: 12 mg/dL (ref 8–27)
CO2: 24 mmol/L (ref 20–29)
Calcium: 9.5 mg/dL (ref 8.7–10.3)
Chloride: 94 mmol/L — ABNORMAL LOW (ref 96–106)
Creatinine, Ser: 0.87 mg/dL (ref 0.57–1.00)
GFR calc Af Amer: 72 mL/min/{1.73_m2} (ref >59)
GFR calc non Af Amer: 63 mL/min/{1.73_m2} (ref >59)
Glucose: 110 mg/dL — ABNORMAL HIGH (ref 65–99)
Potassium: 4.5 mmol/L (ref 3.5–5.2)
Sodium: 131 mmol/L — ABNORMAL LOW (ref 134–144)

## 2020-09-02 DIAGNOSIS — M79672 Pain in left foot: Secondary | ICD-10-CM | POA: Diagnosis not present

## 2020-09-02 DIAGNOSIS — G8929 Other chronic pain: Secondary | ICD-10-CM | POA: Diagnosis not present

## 2020-09-02 DIAGNOSIS — L851 Acquired keratosis [keratoderma] palmaris et plantaris: Secondary | ICD-10-CM | POA: Diagnosis not present

## 2020-09-02 DIAGNOSIS — M7741 Metatarsalgia, right foot: Secondary | ICD-10-CM | POA: Diagnosis not present

## 2020-09-02 DIAGNOSIS — M21621 Bunionette of right foot: Secondary | ICD-10-CM | POA: Diagnosis not present

## 2020-09-02 DIAGNOSIS — M2041 Other hammer toe(s) (acquired), right foot: Secondary | ICD-10-CM | POA: Diagnosis not present

## 2020-09-02 DIAGNOSIS — I83893 Varicose veins of bilateral lower extremities with other complications: Secondary | ICD-10-CM | POA: Diagnosis not present

## 2020-09-02 DIAGNOSIS — M2011 Hallux valgus (acquired), right foot: Secondary | ICD-10-CM | POA: Diagnosis not present

## 2020-09-02 DIAGNOSIS — D2371 Other benign neoplasm of skin of right lower limb, including hip: Secondary | ICD-10-CM | POA: Diagnosis not present

## 2020-09-02 DIAGNOSIS — D2372 Other benign neoplasm of skin of left lower limb, including hip: Secondary | ICD-10-CM | POA: Diagnosis not present

## 2020-09-02 DIAGNOSIS — M79671 Pain in right foot: Secondary | ICD-10-CM | POA: Diagnosis not present

## 2020-09-02 DIAGNOSIS — M7742 Metatarsalgia, left foot: Secondary | ICD-10-CM | POA: Diagnosis not present

## 2020-09-02 DIAGNOSIS — Q828 Other specified congenital malformations of skin: Secondary | ICD-10-CM | POA: Diagnosis not present

## 2020-09-02 DIAGNOSIS — L909 Atrophic disorder of skin, unspecified: Secondary | ICD-10-CM | POA: Diagnosis not present

## 2020-09-11 ENCOUNTER — Ambulatory Visit: Payer: Self-pay | Admitting: *Deleted

## 2020-09-11 DIAGNOSIS — M5441 Lumbago with sciatica, right side: Secondary | ICD-10-CM | POA: Diagnosis not present

## 2020-09-11 DIAGNOSIS — I4891 Unspecified atrial fibrillation: Secondary | ICD-10-CM | POA: Diagnosis not present

## 2020-09-11 DIAGNOSIS — I10 Essential (primary) hypertension: Secondary | ICD-10-CM | POA: Diagnosis not present

## 2020-09-11 DIAGNOSIS — R9431 Abnormal electrocardiogram [ECG] [EKG]: Secondary | ICD-10-CM | POA: Diagnosis not present

## 2020-09-11 DIAGNOSIS — R0602 Shortness of breath: Secondary | ICD-10-CM | POA: Diagnosis not present

## 2020-09-11 DIAGNOSIS — M25551 Pain in right hip: Secondary | ICD-10-CM | POA: Diagnosis not present

## 2020-09-11 NOTE — Telephone Encounter (Signed)
Pt was with daughter in law who is calling in for pt. Pt was seen at a dr office this morning for a f/u from an injection in her hip.   When they took her BP it was high.   So after the dr saw her they rechecked it and it was still high so they told her to call her PCP.  Her BP after coming home and resting for an hour and drinking plenty of water that the dr told her to do they rechecked her BP and it's 230/111.  When I referred them to the ED the pt said,  "They don't ever do anything for me".   "They don't give me nothing for the BP".   She has been to ED at [facility] and [facility] with elevated BP in the past. I let her know Dr. Army Melia was not in the office this week and they hung up the phone at this point before I could offer to send a note to the office and have someone call her back.  I have sent my note to [facility] high priority since I don't think she will go back to the ED.   The protocol also indicates a PCP triage so will send my notes to Dr. Gaspar Cola office.    I did not get a chance to ask if she was having any symptoms before the phone call was ended.    I  Reason for Disposition . [1] Systolic BP  >= 497 OR Diastolic >= 026  AND [3] having NO cardiac or neurologic symptoms  Answer Assessment - Initial Assessment Questions 1. BLOOD PRESSURE: "What is the blood pressure?" "Did you take at least two measurements 5 minutes apart?"     230/111 just now.    Daughter in law calling but pt with her.   She went to dr for a f/u to dr shot in her hip.  Her BP was high in the office. She went to ED in [facility].   They didn't do anything for her.   Dr. Army Melia told pt to start taking a BP pill mid day.     Pt was in the background saying they don't do anything for me in the ER.  2. ONSET: "When did you take your blood pressure?"     Just now after coming home and drinking plenty of water like the dr who gave her the shot in her her  said to do.  She did not get a shot today in her hip per daughter in law. 3. HOW: "How did you obtain the blood pressure?" (e.g., visiting nurse, automatic home BP monitor)     It was done in the dr's office earlier today and again when she got home and had been resting for an hour. 4. HISTORY: "Do you have a history of high blood pressure?"     Yes 5. MEDICATIONS: "Are you taking any medications for blood pressure?" "Have you missed any doses recently?"     Yes  Not missed any doses 6. OTHER SYMPTOMS: "Do you have any symptoms?" (e.g., headache, chest pain, blurred vision, difficulty breathing, weakness)     Unable to assess 7. PREGNANCY: "Is there any chance you are pregnant?" "When was your last menstrual period?"     N/A due to age  Protocols used: BLOOD PRESSURE - HIGH-A-AH

## 2020-09-11 NOTE — Telephone Encounter (Signed)
Called pt spoke to daughter in law. Pts BP is elevated her systolic is in the 291B and her diastolic is in the 166M. Pt is not having any symptoms. Told pts daughter in law to take the pt to the hospital. She verbalized understanding and stated that she "was on the way to take her now".  KP

## 2020-09-12 ENCOUNTER — Telehealth: Payer: Self-pay

## 2020-09-12 NOTE — Telephone Encounter (Signed)
Copied from [place] [id number]. Topic: General - Other >> Sep 12, 2020 11:56 AM Keene Breath wrote: Reason for CRM: Patient is calling to inform the doctor that her BP has been fluctuating high.  She went to [facility] last night because it was very high.  Patient believes it is her medication.  Please advise and call patient to discuss CB# [phone]

## 2020-09-15 ENCOUNTER — Ambulatory Visit: Payer: Self-pay | Admitting: *Deleted

## 2020-09-15 NOTE — Telephone Encounter (Signed)
Patient went to the hospital-Thursday for high blood pressure- patient states she was advised to see PCP first thing this morning. Patient states her readings are 200 and over-Hospital reading -170/101 from [facility]- she declines to take BP for me today- she states it will be too high- she is nervous.  Call to office- the first thing they have is Wednesday at 4:20- patient states that is not good enough- she needs to be seen now. Told patient I would let them know and have someone call her back   Reason for Disposition . Systolic BP  >= 161 OR Diastolic >= 096  Answer Assessment - Initial Assessment Questions 1. BLOOD PRESSURE: "What is the blood pressure?" "Did you take at least two measurements 5 minutes apart?"     Patient is too nervous to check it today- refuses 2. ONSET: "When did you take your blood pressure?"     Yesterday last checked 3. HOW: "How did you obtain the blood pressure?" (e.g., visiting nurse, automatic home BP monitor)     Automatic cuff 4. HISTORY: "Do you have a history of high blood pressure?"     yes 5. MEDICATIONS: "Are you taking any medications for blood pressure?" "Have you missed any doses recently?"     Yes- no missed doses 6. OTHER SYMPTOMS: "Do you have any symptoms?" (e.g., headache, chest pain, blurred vision, difficulty breathing, weakness)     no 7. PREGNANCY: "Is there any chance you are pregnant?" "When was your last menstrual period?"     n/a  Protocols used: BLOOD PRESSURE - HIGH-A-AH

## 2020-09-15 NOTE — Telephone Encounter (Signed)
Patient scheduled for HTN on Wednesday at 420.

## 2020-09-15 NOTE — Telephone Encounter (Signed)
Spoke to pt let her know that we don't have any earlier appts. Explain to patient that we scheduled her for the next available appt 10/07/2020. Pt verbalized understanding.  KP

## 2020-09-17 ENCOUNTER — Other Ambulatory Visit: Payer: Self-pay

## 2020-09-17 ENCOUNTER — Ambulatory Visit (INDEPENDENT_AMBULATORY_CARE_PROVIDER_SITE_OTHER): Payer: Medicare Other | Admitting: Internal Medicine

## 2020-09-17 ENCOUNTER — Encounter: Payer: Self-pay | Admitting: Internal Medicine

## 2020-09-17 VITALS — BP 142/88 | HR 81 | Temp 98.0°F | Ht 67.0 in | Wt 196.0 lb

## 2020-09-17 DIAGNOSIS — F411 Generalized anxiety disorder: Secondary | ICD-10-CM

## 2020-09-17 DIAGNOSIS — D6869 Other thrombophilia: Secondary | ICD-10-CM | POA: Diagnosis not present

## 2020-09-17 DIAGNOSIS — I482 Chronic atrial fibrillation, unspecified: Secondary | ICD-10-CM | POA: Diagnosis not present

## 2020-09-17 DIAGNOSIS — I10 Essential (primary) hypertension: Secondary | ICD-10-CM

## 2020-09-17 MED ORDER — SERTRALINE HCL 25 MG PO TABS: 25.0000 mg | ORAL_TABLET | Freq: Every day | ORAL | 3 refills | Status: DC

## 2020-09-17 NOTE — Progress Notes (Signed)
Date:  09/17/2020   Name:  Ariana Guzman   DOB:  01/17/1941   MRN:  [id number]  Here today with her daughter in law Sherian Maroon. Chief Complaint: Follow-up (HTN)  Hypertension This is a chronic problem. The problem has been waxing and waning since onset. The problem is resistant. Associated symptoms include anxiety. Pertinent negatives include no headaches, palpitations or shortness of breath. Past treatments include beta blockers, diuretics and angiotensin blockers. The current treatment provides moderate improvement.  Anxiety Presents for follow-up visit. Symptoms include excessive worry, insomnia, muscle tension and nervous/anxious behavior. Patient reports no depressed mood, dizziness, palpitations, panic, shortness of breath or suicidal ideas. Symptoms occur most days.   Compliance with medications: takes low dose xanax at bedtime to help her relax for sleep.  She was seen about one month ago and medications adjusted.  Since that time she has been taking metoprolol tid, spironolactone 2 daily and losartan 100 mg.  In general her BP has been controlled but it does jump up at times.  She admits to having some generalized anxiety which likely exacerbates her BP, esp when it reads slightly high. She has a daughter in law who is with her today who has a manual cuff and can check her BP intermittently.   Lab Results  Component Value Date   CREATININE 0.87 08/14/2020   BUN 12 08/14/2020   NA 131 (L) 08/14/2020   K 4.5 08/14/2020   CL 94 (L) 08/14/2020   CO2 24 08/14/2020   Lab Results  Component Value Date   CHOL 174 05/28/2019   HDL 59 05/28/2019   LDLCALC 99 05/28/2019   TRIG 84 05/28/2019   CHOLHDL 2.9 05/28/2019   Lab Results  Component Value Date   TSH 2.636 06/05/2020   Lab Results  Component Value Date   HGBA1C 6.3 (H) 06/05/2020   Lab Results  Component Value Date   WBC 8.0 06/05/2020   HGB 12.7 06/05/2020   HCT 38.6 06/05/2020   MCV 90.8 06/05/2020   PLT  239 06/05/2020   Lab Results  Component Value Date   ALT 17 06/05/2020   AST 18 06/05/2020   ALKPHOS 54 06/05/2020   BILITOT 1.2 06/05/2020     Review of Systems  Constitutional: Negative for chills, fatigue and fever.  Respiratory: Negative for cough, chest tightness, shortness of breath and wheezing.   Cardiovascular: Negative for palpitations.  Neurological: Negative for dizziness, light-headedness and headaches.  Psychiatric/Behavioral: Negative for dysphoric mood and suicidal ideas. The patient is nervous/anxious and has insomnia.     Patient Active Problem List   Diagnosis Date Noted  . Bursitis of hip 06/05/2020  . Acquired thrombophilia ([place]) 06/05/2020  . Hyponatremia 04/05/2020  . Environmental and seasonal allergies 04/03/2020  . Pre-diabetes 05/29/2018  . Primary insomnia 09/15/2017  . Anxiety, generalized 09/15/2017  . Essential hypertension 09/09/2017  . 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 Bilateral lower extremity edema 09/09/2017  . Current use of long term anticoagulation 07/28/2017  . 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 Otitis externa, chronic 12/03/2015  . Obstructive sleep apnea 10/07/2015  . Asthma, persistent controlled 09/25/2015  . Acquired hypothyroidism 02/29/2012  . Chronic atrial fibrillation ([place]) 02/29/2012  . Hyperlipidemia 02/29/2012    Allergies  Allergen Reactions  . Tramadol Shortness Of Breath  . Meloxicam Rash    Past Surgical History:  Procedure Laterality Date  . ABDOMINAL HYSTERECTOMY  1991   uterus  only- still has cervix - done for prolapse  . COLONOSCOPY  2009   normal  . TOE SURGERY      Social History   Tobacco Use  . Smoking status: Former Smoker    Packs/day: 0.50    Years: 29.00    Pack years: 14.50    Types: Cigarettes    Quit date: 09/28/1989    Years since quitting: 30.9  . Smokeless tobacco: Never Used  . Tobacco comment: smoking cessation  materials not required  Vaping Use  . Vaping Use: Never used  Substance Use Topics  . Alcohol use: No  . Drug use: No     Medication list has been reviewed and updated.  Current Meds  Medication Sig  . albuterol (PROVENTIL) (2.5 MG/3ML) 0.083% nebulizer solution Take 2.5 mg by nebulization every 6 (six) hours as needed.  . ALPRAZolam (XANAX) 0.25 MG tablet TAKE 1 TABLET BY MOUTH AT BEDTIME  . Ascorbic Acid (VITAMIN C) 1000 MG tablet Take 1,000 mg by mouth daily.  . Calcium Carb-Cholecalciferol [phone] MG-UNIT TABS Take by mouth.  . cetirizine (ZYRTEC) 10 MG tablet Take 10 mg by mouth daily.  . cholecalciferol (VITAMIN D) 1000 units tablet Take 1,000 Units by mouth daily.  . Fluticasone-Salmeterol (ADVAIR) 250-50 MCG/DOSE AEPB Inhale 1 puff into the lungs 2 (two) times daily.  Marland Kitchen latanoprost (XALATAN) 0.005 % ophthalmic solution 1 drop at bedtime.  Marland Kitchen levothyroxine (SYNTHROID) 75 MCG tablet TAKE 1 TABLET BY MOUTH DAILY BEFORE BREAKFAST  . losartan (COZAAR) 100 MG tablet Take 1 tablet (100 mg total) by mouth daily.  Marland Kitchen MAGNESIUM-ZINC PO Take 500 mg by mouth at bedtime as needed.   Marland Kitchen MELATONIN PO Take by mouth.  . metoprolol tartrate (LOPRESSOR) 25 MG tablet Three times a day  . montelukast (SINGULAIR) 10 MG tablet Take 10 mg by mouth daily.  . Multiple Vitamins-Minerals (OCUVITE EYE HEATLH GUMMIES PO) Take by mouth daily.  . multivitamin-lutein (OCUVITE-LUTEIN) CAPS capsule Take 1 capsule by mouth daily.  . Omega-3 Fatty Acids (FISH OIL PO) Take by mouth.  . Rivaroxaban (XARELTO) 15 MG TABS tablet Take 15 mg by mouth daily with supper.   Marland Kitchen spironolactone (ALDACTONE) 25 MG tablet Take 1 tablet (25 mg total) by mouth 2 (two) times daily.    PHQ 2/9 Scores 09/17/2020 08/14/2020 08/06/2020 05/12/2020  PHQ - 2 Score 0 0 1 0  PHQ- 9 Score 0 0 1 -    GAD 7 : Generalized Anxiety Score 09/17/2020 08/14/2020 08/06/2020 04/03/2020  Nervous, Anxious, on Edge 3 1 1  0  Control/stop worrying 1 1 1  0   Worry too much - different things 1 1 1  0  Trouble relaxing 0 0 0 0  Restless 0 0 0 0  Easily annoyed or irritable 0 0 0 0  Afraid - awful might happen 0 0 0 0  Total GAD 7 Score 5 3 3  0  Anxiety Difficulty - - - Not difficult at all    BP Readings from Last 3 Encounters:  09/17/20 (!) 142/88  08/14/20 140/62  08/06/20 (!) 164/92    Physical Exam Vitals and nursing note reviewed.  Constitutional:      General: She is not in acute distress.    Appearance: She is well-developed.  HENT:     Head: Normocephalic and atraumatic.  Cardiovascular:     Rate and Rhythm: Normal rate. Rhythm irregular.     Pulses: Normal pulses.     Heart sounds: No murmur heard.  Pulmonary:     Effort: Pulmonary effort is normal. No respiratory distress.  Musculoskeletal:        General: Normal range of motion.     Cervical back: Normal range of motion.     Right lower leg: No edema.     Left lower leg: No edema.  Lymphadenopathy:     Cervical: No cervical adenopathy.  Skin:    General: Skin is warm and dry.     Findings: No rash.  Neurological:     General: No focal deficit present.     Mental Status: She is alert and oriented to person, place, and time.  Psychiatric:        Attention and Perception: Attention normal.        Mood and Affect: Mood and affect and mood normal.        Behavior: Behavior normal.     Wt Readings from Last 3 Encounters:  09/17/20 196 lb (88.9 kg)  08/14/20 195 lb (88.5 kg)  08/06/20 197 lb (89.4 kg)    BP (!) 142/88   Pulse 81   Temp 98 F (36.7 C) (Oral)   Ht 5\' 7"  (1.702 m)   Wt 196 lb (88.9 kg)   SpO2 97%   BMI 30.70 kg/m   Assessment and Plan: 1. Essential hypertension BP improved with medication changes Encourage pt to only check BP once a week when relaxed and at rest Avoid going to the ER unless she has sx of CP, dizziness, SOB etc or BP remains very high for several hours She is seeing cardiology in a few weeks for follow up  2.  Generalized anxiety disorder Likely contributing to BP issues Continue low dose xanax at bedtime Begin low dose sertraline for generalized anxiety Follow up in 2 months - sooner if problems occur - sertraline (ZOLOFT) 25 MG tablet; Take 1 tablet (25 mg total) by mouth at bedtime.  Dispense: 30 tablet; Refill: 3  3. Chronic atrial fibrillation (HCC) Rate controlled anticoagulated  4. Acquired thrombophilia ([place]) 2/2 xarelto   Partially dictated using Editor, commissioning. Any errors are unintentional.  Halina Maidens, MD [facility]  09/17/2020

## 2020-09-20 ENCOUNTER — Telehealth: Payer: Self-pay | Admitting: Internal Medicine

## 2020-09-20 DIAGNOSIS — I10 Essential (primary) hypertension: Secondary | ICD-10-CM

## 2020-09-20 NOTE — Telephone Encounter (Signed)
Future visit in 2 months  

## 2020-09-22 ENCOUNTER — Other Ambulatory Visit: Payer: Self-pay | Admitting: Internal Medicine

## 2020-09-22 DIAGNOSIS — I10 Essential (primary) hypertension: Secondary | ICD-10-CM

## 2020-09-22 MED ORDER — SPIRONOLACTONE 25 MG PO TABS: 25.0000 mg | ORAL_TABLET | Freq: Two times a day (BID) | ORAL | 1 refills | Status: DC

## 2020-09-22 NOTE — Telephone Encounter (Signed)
The incorrect Rx was sent on Saturday.  I have corrected it and sent it to [facility].

## 2020-09-22 NOTE — Telephone Encounter (Signed)
Patient called and stated that the new prescription that was sent in for the spirolactone should have been increased to 2 tablets a day. She said that the Dr. Increased it to 2 tablets instead of 1. When she went to the pharmacy they said she would have to pay because its too soon for her to get it. She needs a new prescription for 2 times a day sent to the pharmacy. ([facility] in [place]). Please advise

## 2020-09-25 ENCOUNTER — Telehealth: Payer: Self-pay

## 2020-09-25 ENCOUNTER — Other Ambulatory Visit: Payer: Self-pay

## 2020-09-25 DIAGNOSIS — I10 Essential (primary) hypertension: Secondary | ICD-10-CM

## 2020-09-25 MED ORDER — LOSARTAN POTASSIUM 100 MG PO TABS: 100.0000 mg | ORAL_TABLET | Freq: Every day | ORAL | 1 refills | Status: DC

## 2020-09-25 NOTE — Telephone Encounter (Signed)
Copied from [place] [id number]. Topic: General - Inquiry >> Sep 25, 2020 10:55 AM Greggory Keen D wrote: Reason for CRM: Pt called saying [facility] [place] needs a new rx for her Metoprolol for 3 times a day instead of 2 times a day.  She will need a refill on her Losartan 100 mg on Monday     CB#  [phone]

## 2020-09-25 NOTE — Telephone Encounter (Signed)
Called [facility] about metoprolol 3 times a day. They had two prescriptions one for twice a day and one for three times a day. [facility] will discontinue Rx for 2 times a day.   Spoke to pt let her know that she should be able to get both medications from her pharmacy. Pt verbalized understanding.  KP

## 2020-10-06 ENCOUNTER — Other Ambulatory Visit: Payer: Self-pay | Admitting: Internal Medicine

## 2020-10-06 DIAGNOSIS — E039 Hypothyroidism, unspecified: Secondary | ICD-10-CM

## 2020-10-07 DIAGNOSIS — I482 Chronic atrial fibrillation, unspecified: Secondary | ICD-10-CM | POA: Diagnosis not present

## 2020-10-10 DIAGNOSIS — G4733 Obstructive sleep apnea (adult) (pediatric): Secondary | ICD-10-CM | POA: Diagnosis not present

## 2020-10-14 ENCOUNTER — Telehealth: Payer: Self-pay

## 2020-10-14 DIAGNOSIS — L909 Atrophic disorder of skin, unspecified: Secondary | ICD-10-CM | POA: Diagnosis not present

## 2020-10-14 DIAGNOSIS — D2372 Other benign neoplasm of skin of left lower limb, including hip: Secondary | ICD-10-CM | POA: Diagnosis not present

## 2020-10-14 DIAGNOSIS — M21621 Bunionette of right foot: Secondary | ICD-10-CM | POA: Diagnosis not present

## 2020-10-14 DIAGNOSIS — M2042 Other hammer toe(s) (acquired), left foot: Secondary | ICD-10-CM | POA: Diagnosis not present

## 2020-10-14 DIAGNOSIS — Q828 Other specified congenital malformations of skin: Secondary | ICD-10-CM | POA: Diagnosis not present

## 2020-10-14 DIAGNOSIS — L851 Acquired keratosis [keratoderma] palmaris et plantaris: Secondary | ICD-10-CM | POA: Diagnosis not present

## 2020-10-14 DIAGNOSIS — I83893 Varicose veins of bilateral lower extremities with other complications: Secondary | ICD-10-CM | POA: Diagnosis not present

## 2020-10-14 DIAGNOSIS — M79671 Pain in right foot: Secondary | ICD-10-CM | POA: Diagnosis not present

## 2020-10-14 DIAGNOSIS — M2041 Other hammer toe(s) (acquired), right foot: Secondary | ICD-10-CM | POA: Diagnosis not present

## 2020-10-14 DIAGNOSIS — B351 Tinea unguium: Secondary | ICD-10-CM | POA: Diagnosis not present

## 2020-10-14 DIAGNOSIS — M7741 Metatarsalgia, right foot: Secondary | ICD-10-CM | POA: Diagnosis not present

## 2020-10-14 DIAGNOSIS — M2011 Hallux valgus (acquired), right foot: Secondary | ICD-10-CM | POA: Diagnosis not present

## 2020-10-14 NOTE — Telephone Encounter (Signed)
Spoke to pt she said that she paid for euthyrox out of pocket and is ok paying for it at this time. Pt said that she has a upcoming appt and she will discuss which medication she should take at her appt.  KP

## 2020-10-14 NOTE — Telephone Encounter (Signed)
Patient is returning a call to [place].  Patient stated that she is going to continue to take the medication Euthyrox, because it has been paid for.  Patient would like her to explain the note regarding her medication.  CB# [phone].

## 2020-10-14 NOTE — Telephone Encounter (Signed)
Called pt to see if she was able to get her thyroid medicine (euthyrox) from the pharmacy.   Will route result note to [facility] Nurse Triage for follow up when patient returns call to clinic. Nurse may give results to patient if they return call. CRM created for this message.    KP

## 2020-10-22 ENCOUNTER — Other Ambulatory Visit: Payer: Self-pay | Admitting: Internal Medicine

## 2020-10-22 DIAGNOSIS — F5101 Primary insomnia: Secondary | ICD-10-CM

## 2020-10-22 NOTE — Telephone Encounter (Signed)
Requested medication (s) are due for refill today:  Provider to review  Requested medication (s) are on the active medication list:   Yes  Future visit scheduled:   Yes   Last ordered: 05/01/2020 #30, 5 refills  Returned because this is a non delegated refill   Requested Prescriptions  Pending Prescriptions Disp Refills   ALPRAZolam (XANAX) 0.25 MG tablet [Pharmacy Med Name: ALPRAZolam 0.25 MG Oral Tablet] 30 tablet 0    Sig: TAKE 1 TABLET BY MOUTH AT BEDTIME      Not Delegated - Psychiatry:  Anxiolytics/Hypnotics Failed - 10/22/2020 10:58 AM      Failed - This refill cannot be delegated      Failed - Urine Drug Screen completed in last 360 days      Passed - Valid encounter within last 6 months    Recent Outpatient Visits           1 month ago Essential hypertension   [place], Laura H, MD   2 months ago Essential hypertension   [place], Laura H, MD   2 months ago Essential hypertension   [facility] Glean Hess, MD   4 months ago Annual physical exam   [facility] Glean Hess, MD   5 months ago Essential hypertension   [facility] Glean Hess, MD       Future Appointments             In 1 month Army Melia Jesse Sans, MD [facility], [place]   In 7 months Army Melia, Jesse Sans, MD [facility], [facility]

## 2020-10-22 NOTE — Telephone Encounter (Signed)
Please review. Last office visit 09/17/2020.  KP

## 2020-11-03 DIAGNOSIS — I482 Chronic atrial fibrillation, unspecified: Secondary | ICD-10-CM | POA: Diagnosis not present

## 2020-11-03 DIAGNOSIS — I10 Essential (primary) hypertension: Secondary | ICD-10-CM | POA: Diagnosis not present

## 2020-11-11 DIAGNOSIS — H2512 Age-related nuclear cataract, left eye: Secondary | ICD-10-CM | POA: Diagnosis not present

## 2020-11-18 ENCOUNTER — Encounter: Payer: Self-pay | Admitting: Ophthalmology

## 2020-11-20 ENCOUNTER — Other Ambulatory Visit
Admission: RE | Admit: 2020-11-20 | Discharge: 2020-11-20 | Disposition: A | Discharge status: Home / Self Care | Payer: Medicare Other | Source: Ambulatory Visit | Attending: Ophthalmology | Admitting: Ophthalmology

## 2020-11-20 ENCOUNTER — Other Ambulatory Visit: Payer: Self-pay

## 2020-11-20 DIAGNOSIS — Z01812 Encounter for preprocedural laboratory examination: Secondary | ICD-10-CM | POA: Diagnosis not present

## 2020-11-20 DIAGNOSIS — Z20822 Contact with and (suspected) exposure to covid-19: Secondary | ICD-10-CM | POA: Diagnosis not present

## 2020-11-21 ENCOUNTER — Ambulatory Visit: Payer: Medicare Other | Admitting: Internal Medicine

## 2020-11-21 LAB — SARS CORONAVIRUS 2 (TAT 6-24 HRS): SARS Coronavirus 2: NEGATIVE

## 2020-11-21 NOTE — Discharge Instructions (Signed)

## 2020-11-24 ENCOUNTER — Encounter: Payer: Self-pay | Admitting: Ophthalmology

## 2020-11-24 ENCOUNTER — Other Ambulatory Visit: Payer: Self-pay | Admitting: Internal Medicine

## 2020-11-24 ENCOUNTER — Other Ambulatory Visit: Payer: Self-pay

## 2020-11-24 ENCOUNTER — Encounter
Admission: RE | Disposition: A | Discharge status: Home / Self Care | Payer: Self-pay | Source: Ambulatory Visit | Attending: Ophthalmology

## 2020-11-24 ENCOUNTER — Ambulatory Visit: Payer: Medicare Other | Admitting: Anesthesiology

## 2020-11-24 ENCOUNTER — Ambulatory Visit
Admission: RE | Admit: 2020-11-24 | Discharge: 2020-11-24 | Disposition: A | Discharge status: Home / Self Care | Payer: Medicare Other | Source: Ambulatory Visit | Attending: Ophthalmology | Admitting: Ophthalmology

## 2020-11-24 DIAGNOSIS — Z791 Long term (current) use of non-steroidal anti-inflammatories (NSAID): Secondary | ICD-10-CM | POA: Insufficient documentation

## 2020-11-24 DIAGNOSIS — H2512 Age-related nuclear cataract, left eye: Secondary | ICD-10-CM | POA: Diagnosis not present

## 2020-11-24 DIAGNOSIS — Z7951 Long term (current) use of inhaled steroids: Secondary | ICD-10-CM | POA: Insufficient documentation

## 2020-11-24 DIAGNOSIS — Z79899 Other long term (current) drug therapy: Secondary | ICD-10-CM | POA: Diagnosis not present

## 2020-11-24 DIAGNOSIS — Z79890 Hormone replacement therapy (postmenopausal): Secondary | ICD-10-CM | POA: Diagnosis not present

## 2020-11-24 DIAGNOSIS — Z7901 Long term (current) use of anticoagulants: Secondary | ICD-10-CM | POA: Insufficient documentation

## 2020-11-24 DIAGNOSIS — H25812 Combined forms of age-related cataract, left eye: Secondary | ICD-10-CM | POA: Diagnosis not present

## 2020-11-24 DIAGNOSIS — Z885 Allergy status to narcotic agent status: Secondary | ICD-10-CM | POA: Insufficient documentation

## 2020-11-24 DIAGNOSIS — Z87891 Personal history of nicotine dependence: Secondary | ICD-10-CM | POA: Insufficient documentation

## 2020-11-24 DIAGNOSIS — F5101 Primary insomnia: Secondary | ICD-10-CM

## 2020-11-24 HISTORY — DX: Sleep apnea, unspecified: G47.30

## 2020-11-24 HISTORY — DX: Unspecified osteoarthritis, unspecified site: M19.90

## 2020-11-24 HISTORY — PX: CATARACT EXTRACTION W/PHACO: SHX586

## 2020-11-24 HISTORY — DX: Allergy, unspecified, initial encounter: T78.40XA

## 2020-11-24 SURGERY — PHACOEMULSIFICATION, CATARACT, WITH IOL INSERTION
Anesthesia: Monitor Anesthesia Care | Site: Eye | Laterality: Left

## 2020-11-24 MED ORDER — LACTATED RINGERS IV SOLN: INTRAVENOUS | Status: DC

## 2020-11-24 MED ORDER — LIDOCAINE HCL (PF) 2 % IJ SOLN
INTRAOCULAR | Status: DC | PRN
  Administered 2020-11-24: 1 mL via INTRAOCULAR

## 2020-11-24 MED ORDER — MOXIFLOXACIN HCL 0.5 % OP SOLN
OPHTHALMIC | Status: DC | PRN
  Administered 2020-11-24: 0.2 mL via OPHTHALMIC

## 2020-11-24 MED ORDER — TETRACAINE HCL 0.5 % OP SOLN
1.0000 [drp] | OPHTHALMIC | Status: DC | PRN
  Administered 2020-11-24 (×3): 1 [drp] via OPHTHALMIC

## 2020-11-24 MED ORDER — ARMC OPHTHALMIC DILATING DROPS
1.0000 "application " | OPHTHALMIC | Status: DC | PRN
  Administered 2020-11-24 (×3): 1 via OPHTHALMIC

## 2020-11-24 MED ORDER — FENTANYL CITRATE (PF) 100 MCG/2ML IJ SOLN
INTRAMUSCULAR | Status: DC | PRN
  Administered 2020-11-24: 50 ug via INTRAVENOUS

## 2020-11-24 MED ORDER — EPINEPHRINE PF 1 MG/ML IJ SOLN
INTRAOCULAR | Status: DC | PRN
  Administered 2020-11-24: 85 mL via OPHTHALMIC

## 2020-11-24 MED ORDER — SODIUM HYALURONATE 23 MG/ML IO SOLN
INTRAOCULAR | Status: DC | PRN
  Administered 2020-11-24: 0.6 mL via INTRAOCULAR

## 2020-11-24 MED ORDER — ACETAMINOPHEN 325 MG PO TABS: 325.0000 mg | ORAL_TABLET | Freq: Once | ORAL | Status: DC

## 2020-11-24 MED ORDER — SODIUM HYALURONATE 10 MG/ML IO SOLN
INTRAOCULAR | Status: DC | PRN
  Administered 2020-11-24: 0.55 mL via INTRAOCULAR

## 2020-11-24 MED ORDER — MIDAZOLAM HCL 2 MG/2ML IJ SOLN
INTRAMUSCULAR | Status: DC | PRN
  Administered 2020-11-24: 1 mg via INTRAVENOUS

## 2020-11-24 MED ORDER — ACETAMINOPHEN 160 MG/5ML PO SOLN: 325.0000 mg | Freq: Once | ORAL | Status: DC

## 2020-11-24 SURGICAL SUPPLY — 17 items
CANNULA ANT/CHMB 27GA (MISCELLANEOUS) ×4 IMPLANT
DISSECTOR HYDRO NUCLEUS 50X22 (MISCELLANEOUS) ×2 IMPLANT
GLOVE PI ULTRA LF STRL 7.5 (GLOVE) ×1 IMPLANT
GLOVE PI ULTRA NON LATEX 7.5 (GLOVE) ×1
GLOVE SURG SYN 8.5  E (GLOVE) ×1
GLOVE SURG SYN 8.5 E (GLOVE) ×1 IMPLANT
GOWN STRL REUS W/ TWL LRG LVL3 (GOWN DISPOSABLE) ×2 IMPLANT
GOWN STRL REUS W/TWL LRG LVL3 (GOWN DISPOSABLE) ×4
LENS IOL TECNIS EYHANCE 21.0 (Intraocular Lens) ×2 IMPLANT
MARKER SKIN DUAL TIP RULER LAB (MISCELLANEOUS) ×2 IMPLANT
PACK DR. KING ARMS (PACKS) ×2 IMPLANT
PACK EYE AFTER SURG (MISCELLANEOUS) ×2 IMPLANT
PACK OPTHALMIC (MISCELLANEOUS) ×2 IMPLANT
SYR 3ML LL SCALE MARK (SYRINGE) ×2 IMPLANT
SYR TB 1ML LUER SLIP (SYRINGE) ×2 IMPLANT
WATER STERILE IRR 250ML POUR (IV SOLUTION) ×2 IMPLANT
WIPE NON LINTING 3.25X3.25 (MISCELLANEOUS) ×2 IMPLANT

## 2020-11-24 NOTE — Transfer of Care (Signed)
Immediate Anesthesia Transfer of Care Note  Patient: Ariana Guzman  Procedure(s) Performed: CATARACT EXTRACTION PHACO AND INTRAOCULAR LENS PLACEMENT (IOC) LEFT 6.99 00:46.1 (Left Eye)  Patient Location: PACU  Anesthesia Type: MAC  Level of Consciousness: awake, alert  and patient cooperative  Airway and Oxygen Therapy: Patient Spontanous Breathing and Patient connected to supplemental oxygen  Post-op Assessment: Post-op Vital signs reviewed, Patient's Cardiovascular Status Stable, Respiratory Function Stable, Patent Airway and No signs of Nausea or vomiting  Post-op Vital Signs: Reviewed and stable  Complications: No complications documented.

## 2020-11-24 NOTE — Telephone Encounter (Signed)
Requested medication (s) are due for refill today: yes  Requested medication (s) are on the active medication list: yes  Last refill:  10/22/20 #30 0 refills  Future visit scheduled: yes in 2 weeks  Notes to clinic:  not delegated per protocol     Requested Prescriptions  Pending Prescriptions Disp Refills   ALPRAZolam (XANAX) 0.25 MG tablet [Pharmacy Med Name: ALPRAZolam 0.25 MG Oral Tablet] 30 tablet 0    Sig: TAKE 1 TABLET BY MOUTH AT BEDTIME      Not Delegated - Psychiatry:  Anxiolytics/Hypnotics Failed - 11/24/2020 12:33 PM      Failed - This refill cannot be delegated      Failed - Urine Drug Screen completed in last 360 days      Passed - Valid encounter within last 6 months    Recent Outpatient Visits           2 months ago Essential hypertension   [place], Laura H, MD   3 months ago Essential hypertension   [place], Laura H, MD   3 months ago Essential hypertension   [facility] Glean Hess, MD   5 months ago Annual physical exam   [facility] Glean Hess, MD   6 months ago Essential hypertension   [facility] Glean Hess, MD       Future Appointments             In 2 weeks Army Melia Jesse Sans, MD [facility], [place]   In 6 months Army Melia, Jesse Sans, MD [facility], [facility]

## 2020-11-24 NOTE — H&P (Signed)
[facility]   Primary Care Physician:  Glean Hess, MD Ophthalmologist: Dr. Benay Pillow  Pre-Procedure History & Physical: HPI:  Ariana Guzman is a 80 y.o. female here for cataract surgery.   Past Medical History:  Diagnosis Date  . Allergies   . Arthritis   . Asthma   . Atrial fibrillation ([place])   . Hypertension   . Peripheral arterial disease ([place])   . Sleep apnea    CPAP    Past Surgical History:  Procedure Laterality Date  . ABDOMINAL HYSTERECTOMY  1991   uterus only- still has cervix - done for prolapse  . COLONOSCOPY  2009   normal  . TOE SURGERY      Prior to Admission medications   Medication Sig Start Date End Date Taking? Authorizing Provider  Magnesium 100 MG CAPS Take by mouth.   Yes [provider]  albuterol (PROVENTIL) (2.5 MG/3ML) 0.083% nebulizer solution Take 2.5 mg by nebulization every 6 (six) hours as needed. 01/11/20   [provider]  ALPRAZolam Duanne Moron) 0.25 MG tablet TAKE 1 TABLET BY MOUTH AT BEDTIME 10/22/20   Glean Hess, MD  Ascorbic Acid (VITAMIN C) 1000 MG tablet Take 1,000 mg by mouth daily.    [provider]  Calcium Carb-Cholecalciferol [id number] MG-UNIT TABS Take by mouth.    [provider]  cetirizine (ZYRTEC) 10 MG tablet Take 10 mg by mouth 2 (two) times daily.    [provider]  cholecalciferol (VITAMIN D) 1000 units tablet Take 1,000 Units by mouth daily.    [provider]  EUTHYROX 75 MCG tablet TAKE 1 TABLET BY MOUTH ONCE DAILY BEFORE BREAKFAST 10/06/20   Glean Hess, MD  Fluticasone-Salmeterol (ADVAIR) 250-50 MCG/DOSE AEPB Inhale 1 puff into the lungs 2 (two) times daily.    [provider]  latanoprost (XALATAN) 0.005 % ophthalmic solution 1 drop at bedtime. 03/30/20   [provider]  losartan (COZAAR) 100 MG tablet Take 1 tablet (100 mg total) by mouth daily. 09/25/20   Glean Hess, MD  MAGNESIUM-ZINC PO Take 500 mg by mouth at  bedtime as needed.  Patient not taking: Reported on 11/18/2020    [provider]  MELATONIN PO Take by mouth.    [provider]  metoprolol tartrate (LOPRESSOR) 25 MG tablet Three times a day 08/14/20   Glean Hess, MD  montelukast (SINGULAIR) 10 MG tablet Take 10 mg by mouth daily. 07/06/20   [provider]  Multiple Vitamins-Minerals (OCUVITE EYE HEATLH GUMMIES PO) Take by mouth daily.    [provider]  multivitamin-lutein (OCUVITE-LUTEIN) CAPS capsule Take 1 capsule by mouth daily. Patient not taking: Reported on 11/18/2020    [provider]  Omega-3 Fatty Acids (FISH OIL PO) Take by mouth.    [provider]  Rivaroxaban (XARELTO) 15 MG TABS tablet Take 15 mg by mouth daily with supper.     [provider]  sertraline (ZOLOFT) 25 MG tablet Take 1 tablet (25 mg total) by mouth at bedtime. 09/17/20   Glean Hess, MD  spironolactone (ALDACTONE) 25 MG tablet Take 1 tablet (25 mg total) by mouth 2 (two) times daily. 09/22/20   Glean Hess, MD  fexofenadine (ALLEGRA) 180 MG tablet Take 180 mg by mouth daily.  03/28/19  [provider]    Allergies as of 09/04/2020 - Review Complete 08/14/2020  Allergen Reaction Noted  . Tramadol Shortness Of Breath 05/28/2017  . Meloxicam  Rash 05/28/2017    Family History  Problem Relation Age of Onset  . Breast cancer Other 56  . Dementia Mother   . Cancer Father        prostate and bone  . Heart attack Sister     Social History   Socioeconomic History  . Marital status: Widowed    Spouse name: Not on file  . Number of children: 2  . Years of education: Not on file  . Highest education level: 12th grade  Occupational History  . Occupation: retired  Tobacco Use  . Smoking status: Former Smoker    Packs/day: 0.50    Years: 29.00    Pack years: 14.50    Types: Cigarettes    Quit date: 09/28/1989    Years since quitting: 31.1  . Smokeless tobacco: Never  Used  . Tobacco comment: smoking cessation materials not required  Vaping Use  . Vaping Use: Never used  Substance and Sexual Activity  . Alcohol use: No  . Drug use: No  . Sexual activity: Not Currently  Other Topics Concern  . Not on file  Social History Narrative   Pt lives alone   Social Determinants of Health   Financial Resource Strain: Low Risk   . Difficulty of Paying Living Expenses: Not hard at all  Food Insecurity: No Food Insecurity  . Worried About Charity fundraiser in the Last Year: Never true  . Ran Out of Food in the Last Year: Never true  Transportation Needs: No Transportation Needs  . Lack of Transportation (Medical): No  . Lack of Transportation (Non-Medical): No  Physical Activity: Inactive  . Days of Exercise per Week: 0 days  . Minutes of Exercise per Session: 0 min  Stress: No Stress Concern Present  . Feeling of Stress : Only a little  Social Connections: Moderately Isolated  . Frequency of Communication with Friends and Family: More than three times a week  . Frequency of Social Gatherings with Friends and Family: More than three times a week  . Attends Religious Services: More than 4 times per year  . Active Member of Clubs or Organizations: No  . Attends Archivist Meetings: Never  . Marital Status: Widowed  Intimate Partner Violence: Not At Risk  . Fear of Current or Ex-Partner: No  . Emotionally Abused: No  . Physically Abused: No  . Sexually Abused: No    Review of Systems: See HPI, otherwise negative ROS  Physical Exam: Ht 5\' 7"  (1.702 m)   Wt 88.5 kg   BMI 30.54 kg/m  General:   Alert,  pleasant and cooperative in NAD Head:  Normocephalic and atraumatic. Respiratory:  Normal work of breathing. Cardiovascular:  RRR  Impression/Plan: Ariana Guzman is here for cataract surgery.  Risks, benefits, limitations, and alternatives regarding cataract surgery have been reviewed with the patient.  Questions have been answered.   All parties agreeable.   Benay Pillow, MD  11/24/2020, 10:36 AM

## 2020-11-24 NOTE — Telephone Encounter (Signed)
Please review. Last office visit 09/17/2020.  KP

## 2020-11-24 NOTE — Anesthesia Postprocedure Evaluation (Signed)
Anesthesia Post Note  Patient: Ariana Guzman  Procedure(s) Performed: CATARACT EXTRACTION PHACO AND INTRAOCULAR LENS PLACEMENT (IOC) LEFT 6.99 00:46.1 (Left Eye)     Patient location during evaluation: PACU Anesthesia Type: MAC Level of consciousness: awake and alert and oriented Pain management: satisfactory to patient Vital Signs Assessment: post-procedure vital signs reviewed and stable Respiratory status: spontaneous breathing, nonlabored ventilation and respiratory function stable Cardiovascular status: blood pressure returned to baseline and stable Postop Assessment: Adequate PO intake and No signs of nausea or vomiting Anesthetic complications: no   No complications documented.  Raliegh Ip

## 2020-11-24 NOTE — Anesthesia Preprocedure Evaluation (Signed)
Anesthesia Evaluation  Patient identified by MRN, date of birth, ID band Patient awake    Reviewed: Allergy & Precautions, H&P , NPO status , Patient's Chart, lab work & pertinent test results  Airway Mallampati: II  TM Distance: >3 FB Neck ROM: full    Dental no notable dental hx.    Pulmonary asthma , sleep apnea and Continuous Positive Airway Pressure Ventilation , former smoker,    Pulmonary exam normal breath sounds clear to auscultation       Cardiovascular hypertension, Normal cardiovascular exam Rhythm:regular Rate:Normal     Neuro/Psych    GI/Hepatic   Endo/Other  Hypothyroidism Morbid obesity  Renal/GU      Musculoskeletal   Abdominal   Peds  Hematology   Anesthesia Other Findings   Reproductive/Obstetrics                             Anesthesia Physical Anesthesia Plan  ASA: III  Anesthesia Plan: MAC   Post-op Pain Management:    Induction:   PONV Risk Score and Plan: 2 and Treatment may vary due to age or medical condition, TIVA and Midazolam  Airway Management Planned:   Additional Equipment:   Intra-op Plan:   Post-operative Plan:   Informed Consent: I have reviewed the patients History and Physical, chart, labs and discussed the procedure including the risks, benefits and alternatives for the proposed anesthesia with the patient or authorized representative who has indicated his/her understanding and acceptance.     Dental Advisory Given  Plan Discussed with: CRNA  Anesthesia Plan Comments:         Anesthesia Quick Evaluation

## 2020-11-24 NOTE — Op Note (Signed)
OPERATIVE NOTE  Ariana Guzman [id number] 11/24/2020   PREOPERATIVE DIAGNOSIS:  Nuclear sclerotic cataract left eye.  H25.12   POSTOPERATIVE DIAGNOSIS:    Nuclear sclerotic cataract left eye.     PROCEDURE:  Phacoemusification with posterior chamber intraocular lens placement of the left eye   LENS:   Implant Name Type Inv. Item Serial No. Manufacturer Lot No. LRB No. Used Action  LENS IOL TECNIS EYHANCE 21.0 - [id number] Intraocular Lens LENS IOL TECNIS EYHANCE 21.0 [id number] JOHNSON   Left 1 Implanted      Procedure(s): CATARACT EXTRACTION PHACO AND INTRAOCULAR LENS PLACEMENT (IOC) LEFT 6.99 00:46.1 (Left)  DIB00 +21.0   ULTRASOUND TIME: 0 minutes 46 seconds.  CDE 6.99   SURGEON:  Benay Pillow, MD, MPH   ANESTHESIA:  Topical with tetracaine drops augmented with 1% preservative-free intracameral lidocaine.  ESTIMATED BLOOD LOSS: <1 mL   COMPLICATIONS:  None.   DESCRIPTION OF PROCEDURE:  The patient was identified in the holding room and transported to the operating room and placed in the supine position under the operating microscope.  The left eye was identified as the operative eye and it was prepped and draped in the usual sterile ophthalmic fashion.   A 1.0 millimeter clear-corneal paracentesis was made at the 5:00 position. 0.5 ml of preservative-free 1% lidocaine with epinephrine was injected into the anterior chamber.  The anterior chamber was filled with Healon 5 viscoelastic.  A 2.4 millimeter keratome was used to make a near-clear corneal incision at the 2:00 position.  A curvilinear capsulorrhexis was made with a cystotome and capsulorrhexis forceps.  Balanced salt solution was used to hydrodissect and hydrodelineate the nucleus.   Phacoemulsification was then used in stop and chop fashion to remove the lens nucleus and epinucleus.  The remaining cortex was then removed using the irrigation and aspiration handpiece. Healon was then placed into the capsular bag to  distend it for lens placement.  A lens was then injected into the capsular bag.  The remaining viscoelastic was aspirated.   Wounds were hydrated with balanced salt solution.  The anterior chamber was inflated to a physiologic pressure with balanced salt solution.  Intracameral vigamox 0.1 mL undiltued was injected into the eye and a drop placed onto the ocular surface.  No wound leaks were noted.  The patient was taken to the recovery room in stable condition without complications of anesthesia or surgery  Benay Pillow 11/24/2020, 11:32 AM

## 2020-11-25 ENCOUNTER — Encounter: Payer: Self-pay | Admitting: Ophthalmology

## 2020-11-25 MED ORDER — ALPRAZOLAM 0.25 MG PO TABS
0.2500 mg | ORAL_TABLET | Freq: Every day | ORAL | 0 refills | Status: DC
Start: 2020-11-25 — End: 2020-12-25

## 2020-11-27 ENCOUNTER — Encounter: Payer: Self-pay | Admitting: Ophthalmology

## 2020-12-02 DIAGNOSIS — H2511 Age-related nuclear cataract, right eye: Secondary | ICD-10-CM | POA: Diagnosis not present

## 2020-12-04 NOTE — Discharge Instructions (Signed)

## 2020-12-08 ENCOUNTER — Ambulatory Visit
Admission: RE | Admit: 2020-12-08 | Discharge: 2020-12-08 | Disposition: A | Discharge status: Home / Self Care | Payer: Medicare Other | Source: Ambulatory Visit | Attending: Ophthalmology | Admitting: Ophthalmology

## 2020-12-08 ENCOUNTER — Encounter
Admission: RE | Disposition: A | Discharge status: Home / Self Care | Payer: Self-pay | Source: Ambulatory Visit | Attending: Ophthalmology

## 2020-12-08 ENCOUNTER — Ambulatory Visit: Payer: Medicare Other | Admitting: Anesthesiology

## 2020-12-08 ENCOUNTER — Encounter: Payer: Self-pay | Admitting: Ophthalmology

## 2020-12-08 ENCOUNTER — Other Ambulatory Visit: Payer: Self-pay

## 2020-12-08 DIAGNOSIS — Z791 Long term (current) use of non-steroidal anti-inflammatories (NSAID): Secondary | ICD-10-CM | POA: Diagnosis not present

## 2020-12-08 DIAGNOSIS — Z9842 Cataract extraction status, left eye: Secondary | ICD-10-CM | POA: Diagnosis not present

## 2020-12-08 DIAGNOSIS — H2511 Age-related nuclear cataract, right eye: Secondary | ICD-10-CM | POA: Insufficient documentation

## 2020-12-08 DIAGNOSIS — Z79899 Other long term (current) drug therapy: Secondary | ICD-10-CM | POA: Diagnosis not present

## 2020-12-08 DIAGNOSIS — Z79890 Hormone replacement therapy (postmenopausal): Secondary | ICD-10-CM | POA: Insufficient documentation

## 2020-12-08 DIAGNOSIS — Z7951 Long term (current) use of inhaled steroids: Secondary | ICD-10-CM | POA: Insufficient documentation

## 2020-12-08 DIAGNOSIS — Z87891 Personal history of nicotine dependence: Secondary | ICD-10-CM | POA: Diagnosis not present

## 2020-12-08 DIAGNOSIS — Z961 Presence of intraocular lens: Secondary | ICD-10-CM | POA: Diagnosis not present

## 2020-12-08 DIAGNOSIS — Z885 Allergy status to narcotic agent status: Secondary | ICD-10-CM | POA: Diagnosis not present

## 2020-12-08 DIAGNOSIS — H25811 Combined forms of age-related cataract, right eye: Secondary | ICD-10-CM | POA: Diagnosis not present

## 2020-12-08 HISTORY — PX: CATARACT EXTRACTION W/PHACO: SHX586

## 2020-12-08 SURGERY — PHACOEMULSIFICATION, CATARACT, WITH IOL INSERTION
Anesthesia: Monitor Anesthesia Care | Site: Eye | Laterality: Right

## 2020-12-08 MED ORDER — LIDOCAINE HCL (PF) 2 % IJ SOLN
INTRAOCULAR | Status: DC | PRN
  Administered 2020-12-08: 1 mL via INTRAOCULAR

## 2020-12-08 MED ORDER — EPINEPHRINE PF 1 MG/ML IJ SOLN
INTRAOCULAR | Status: DC | PRN
  Administered 2020-12-08: 83 mL via OPHTHALMIC

## 2020-12-08 MED ORDER — FENTANYL CITRATE (PF) 100 MCG/2ML IJ SOLN
INTRAMUSCULAR | Status: DC | PRN
  Administered 2020-12-08: 50 ug via INTRAVENOUS

## 2020-12-08 MED ORDER — MOXIFLOXACIN HCL 0.5 % OP SOLN
OPHTHALMIC | Status: DC | PRN
  Administered 2020-12-08: 0.2 mL via OPHTHALMIC

## 2020-12-08 MED ORDER — SODIUM HYALURONATE 23MG/ML IO SOSY
PREFILLED_SYRINGE | INTRAOCULAR | Status: DC | PRN
  Administered 2020-12-08: 0.6 mL via INTRAOCULAR

## 2020-12-08 MED ORDER — TETRACAINE HCL 0.5 % OP SOLN
1.0000 [drp] | OPHTHALMIC | Status: DC | PRN
  Administered 2020-12-08 (×3): 1 [drp] via OPHTHALMIC

## 2020-12-08 MED ORDER — ARMC OPHTHALMIC DILATING DROPS
1.0000 "application " | OPHTHALMIC | Status: DC | PRN
  Administered 2020-12-08 (×3): 1 via OPHTHALMIC

## 2020-12-08 MED ORDER — LACTATED RINGERS IV SOLN: INTRAVENOUS | Status: DC

## 2020-12-08 MED ORDER — MIDAZOLAM HCL 2 MG/2ML IJ SOLN
INTRAMUSCULAR | Status: DC | PRN
  Administered 2020-12-08 (×2): 1 mg via INTRAVENOUS

## 2020-12-08 MED ORDER — SODIUM HYALURONATE 10 MG/ML IO SOLUTION
PREFILLED_SYRINGE | INTRAOCULAR | Status: DC | PRN
  Administered 2020-12-08: 0.55 mL via INTRAOCULAR

## 2020-12-08 SURGICAL SUPPLY — 17 items
CANNULA ANT/CHMB 27GA (MISCELLANEOUS) ×4 IMPLANT
DISSECTOR HYDRO NUCLEUS 50X22 (MISCELLANEOUS) ×2 IMPLANT
GLOVE PI ULTRA LF STRL 7.5 (GLOVE) ×1 IMPLANT
GLOVE PI ULTRA NON LATEX 7.5 (GLOVE) ×1
GLOVE SURG SYN 8.5  E (GLOVE) ×1
GLOVE SURG SYN 8.5 E (GLOVE) ×1 IMPLANT
GOWN STRL REUS W/ TWL LRG LVL3 (GOWN DISPOSABLE) ×2 IMPLANT
GOWN STRL REUS W/TWL LRG LVL3 (GOWN DISPOSABLE) ×4
LENS IOL TECNIS EYHANCE 20.5 (Intraocular Lens) ×2 IMPLANT
MARKER SKIN DUAL TIP RULER LAB (MISCELLANEOUS) ×2 IMPLANT
PACK DR. KING ARMS (PACKS) ×2 IMPLANT
PACK EYE AFTER SURG (MISCELLANEOUS) ×2 IMPLANT
PACK OPTHALMIC (MISCELLANEOUS) ×2 IMPLANT
SYR 3ML LL SCALE MARK (SYRINGE) ×2 IMPLANT
SYR TB 1ML LUER SLIP (SYRINGE) ×2 IMPLANT
WATER STERILE IRR 250ML POUR (IV SOLUTION) ×2 IMPLANT
WIPE NON LINTING 3.25X3.25 (MISCELLANEOUS) ×2 IMPLANT

## 2020-12-08 NOTE — Anesthesia Procedure Notes (Signed)
Procedure Name: MAC °Performed by: Marycatherine Maniscalco L, CRNA °Pre-anesthesia Checklist: Patient identified, Emergency Drugs available, Suction available, Timeout performed and Patient being monitored °Patient Re-evaluated:Patient Re-evaluated prior to induction °Oxygen Delivery Method: Nasal cannula °Placement Confirmation: positive ETCO2 ° ° ° ° ° ° °

## 2020-12-08 NOTE — Op Note (Signed)
OPERATIVE NOTE  JALONI SORBER [id number] 12/08/2020   PREOPERATIVE DIAGNOSIS:  Nuclear sclerotic cataract right eye.  H25.11   POSTOPERATIVE DIAGNOSIS:    Nuclear sclerotic cataract right eye.     PROCEDURE:  Phacoemusification with posterior chamber intraocular lens placement of the right eye   LENS:   Implant Name Type Inv. Item Serial No. Manufacturer Lot No. LRB No. Used Action  LENS IOL TECNIS EYHANCE 20.5 - [id number] Intraocular Lens LENS IOL TECNIS EYHANCE 20.5 [id number] JOHNSON   Right 1 Implanted       Procedure(s): CATARACT EXTRACTION PHACO AND INTRAOCULAR LENS PLACEMENT (IOC) RIGHT 5.13 00:34.2 (Right)  DIB00 +20.5   ULTRASOUND TIME: 0 minutes 34 seconds.  CDE 5.13   SURGEON:  Benay Pillow, MD, MPH  ANESTHESIOLOGIST: Anesthesiologist: Alisa Graff, MD CRNA: Jeannene Patella, CRNA   ANESTHESIA:  Topical with tetracaine drops augmented with 1% preservative-free intracameral lidocaine.  ESTIMATED BLOOD LOSS: less than 1 mL.   COMPLICATIONS:  None.   DESCRIPTION OF PROCEDURE:  The patient was identified in the holding room and transported to the operating room and placed in the supine position under the operating microscope.  The right eye was identified as the operative eye and it was prepped and draped in the usual sterile ophthalmic fashion.   A 1.0 millimeter clear-corneal paracentesis was made at the 10:30 position. 0.5 ml of preservative-free 1% lidocaine with epinephrine was injected into the anterior chamber.  The anterior chamber was filled with Healon 5 viscoelastic.  A 2.4 millimeter keratome was used to make a near-clear corneal incision at the 8:00 position.  A curvilinear capsulorrhexis was made with a cystotome and capsulorrhexis forceps.  Balanced salt solution was used to hydrodissect and hydrodelineate the nucleus.   Phacoemulsification was then used in stop and chop fashion to remove the lens nucleus and epinucleus.  The remaining cortex was then  removed using the irrigation and aspiration handpiece. Healon was then placed into the capsular bag to distend it for lens placement.  A lens was then injected into the capsular bag.  The remaining viscoelastic was aspirated.   Wounds were hydrated with balanced salt solution.  The anterior chamber was inflated to a physiologic pressure with balanced salt solution.   Intracameral vigamox 0.1 mL undiluted was injected into the eye and a drop placed onto the ocular surface.  No wound leaks were noted.  The patient was taken to the recovery room in stable condition without complications of anesthesia or surgery  Benay Pillow 12/08/2020, 9:49 AM

## 2020-12-08 NOTE — Anesthesia Postprocedure Evaluation (Signed)
Anesthesia Post Note  Patient: Ariana Guzman  Procedure(s) Performed: CATARACT EXTRACTION PHACO AND INTRAOCULAR LENS PLACEMENT (IOC) RIGHT 5.13 00:34.2 (Right Eye)     Patient location during evaluation: PACU Anesthesia Type: MAC Level of consciousness: awake and alert Pain management: pain level controlled Vital Signs Assessment: post-procedure vital signs reviewed and stable Respiratory status: spontaneous breathing, nonlabored ventilation, respiratory function stable and patient connected to nasal cannula oxygen Cardiovascular status: stable and blood pressure returned to baseline Postop Assessment: no apparent nausea or vomiting Anesthetic complications: no   No complications documented.  Alisa Graff

## 2020-12-08 NOTE — Anesthesia Preprocedure Evaluation (Addendum)
Anesthesia Evaluation  Patient identified by MRN, date of birth, ID band Patient awake    Reviewed: Allergy & Precautions, H&P , NPO status , Patient's Chart, lab work & pertinent test results, reviewed documented beta blocker date and time   Airway Mallampati: II  TM Distance: >3 FB Neck ROM: full    Dental no notable dental hx.    Pulmonary asthma , sleep apnea , former smoker,    Pulmonary exam normal breath sounds clear to auscultation       Cardiovascular Exercise Tolerance: Good hypertension, + Peripheral Vascular Disease  + dysrhythmias Atrial Fibrillation  Rhythm:regular Rate:Normal     Neuro/Psych Anxiety negative neurological ROS  negative psych ROS   GI/Hepatic negative GI ROS, Neg liver ROS,   Endo/Other  negative endocrine ROS  Renal/GU negative Renal ROS  negative genitourinary   Musculoskeletal   Abdominal   Peds  Hematology negative hematology ROS (+)   Anesthesia Other Findings   Reproductive/Obstetrics negative OB ROS                            Anesthesia Physical Anesthesia Plan  ASA: III  Anesthesia Plan: MAC   Post-op Pain Management:    Induction:   PONV Risk Score and Plan: 2 and Treatment may vary due to age or medical condition  Airway Management Planned:   Additional Equipment:   Intra-op Plan:   Post-operative Plan:   Informed Consent: I have reviewed the patients History and Physical, chart, labs and discussed the procedure including the risks, benefits and alternatives for the proposed anesthesia with the patient or authorized representative who has indicated his/her understanding and acceptance.     Dental Advisory Given  Plan Discussed with: CRNA  Anesthesia Plan Comments:        Anesthesia Quick Evaluation

## 2020-12-08 NOTE — Transfer of Care (Signed)
Immediate Anesthesia Transfer of Care Note  Patient: Ariana Guzman  Procedure(s) Performed: CATARACT EXTRACTION PHACO AND INTRAOCULAR LENS PLACEMENT (IOC) RIGHT 5.13 00:34.2 (Right Eye)  Patient Location: PACU  Anesthesia Type: MAC  Level of Consciousness: awake, alert  and patient cooperative  Airway and Oxygen Therapy: Patient Spontanous Breathing and Patient connected to supplemental oxygen  Post-op Assessment: Post-op Vital signs reviewed, Patient's Cardiovascular Status Stable, Respiratory Function Stable, Patent Airway and No signs of Nausea or vomiting  Post-op Vital Signs: Reviewed and stable  Complications: No complications documented.

## 2020-12-08 NOTE — H&P (Signed)
[facility]   Primary Care Physician:  Glean Hess, MD Ophthalmologist: Dr. Benay Pillow  Pre-Procedure History & Physical: HPI:  Ariana Guzman is a 80 y.o. female here for cataract surgery.   Past Medical History:  Diagnosis Date  . Allergies   . Arthritis   . Asthma   . Atrial fibrillation ([place])   . Hypertension   . Peripheral arterial disease ([place])   . Sleep apnea    CPAP    Past Surgical History:  Procedure Laterality Date  . ABDOMINAL HYSTERECTOMY  1991   uterus only- still has cervix - done for prolapse  . CATARACT EXTRACTION W/PHACO Left 11/24/2020   Procedure: CATARACT EXTRACTION PHACO AND INTRAOCULAR LENS PLACEMENT (IOC) LEFT 6.99 00:46.1;  Surgeon: Eulogio Bear, MD;  Location: [place];  Service: Ophthalmology;  Laterality: Left;  . COLONOSCOPY  2009   normal  . TOE SURGERY      Prior to Admission medications   Medication Sig Start Date End Date Taking? Authorizing Provider  albuterol (PROVENTIL) (2.5 MG/3ML) 0.083% nebulizer solution Take 2.5 mg by nebulization every 6 (six) hours as needed. 01/11/20  Yes [provider]  ALPRAZolam (XANAX) 0.25 MG tablet Take 1 tablet (0.25 mg total) by mouth at bedtime. 11/25/20  Yes Glean Hess, MD  Ascorbic Acid (VITAMIN C) 1000 MG tablet Take 1,000 mg by mouth daily.   Yes [provider]  Calcium Carb-Cholecalciferol [phone] MG-UNIT TABS Take by mouth.   Yes [provider]  cetirizine (ZYRTEC) 10 MG tablet Take 10 mg by mouth 2 (two) times daily.   Yes [provider]  cholecalciferol (VITAMIN D) 1000 units tablet Take 1,000 Units by mouth daily.   Yes [provider]  EUTHYROX 75 MCG tablet TAKE 1 TABLET BY MOUTH ONCE DAILY BEFORE BREAKFAST 10/06/20  Yes Glean Hess, MD  Fluticasone-Salmeterol (ADVAIR) 250-50 MCG/DOSE AEPB Inhale 1 puff into the lungs 2 (two) times daily.   Yes [provider]  latanoprost (XALATAN) 0.005 %  ophthalmic solution 1 drop at bedtime. 03/30/20  Yes [provider]  losartan (COZAAR) 100 MG tablet Take 1 tablet (100 mg total) by mouth daily. 09/25/20  Yes Glean Hess, MD  Magnesium 100 MG CAPS Take by mouth.   Yes [provider]  MAGNESIUM-ZINC PO Take 500 mg by mouth at bedtime as needed.   Yes [provider]  MELATONIN PO Take by mouth.   Yes [provider]  metoprolol tartrate (LOPRESSOR) 25 MG tablet Three times a day 08/14/20  Yes Glean Hess, MD  montelukast (SINGULAIR) 10 MG tablet Take 10 mg by mouth daily. 07/06/20  Yes [provider]  Multiple Vitamins-Minerals ([place]) Take by mouth daily.   Yes [provider]  multivitamin-lutein (OCUVITE-LUTEIN) CAPS capsule Take 1 capsule by mouth daily.   Yes [provider]  Rivaroxaban (XARELTO) 15 MG TABS tablet Take 15 mg by mouth daily with supper.    Yes [provider]  sertraline (ZOLOFT) 25 MG tablet Take 1 tablet (25 mg total) by mouth at bedtime. 09/17/20  Yes Glean Hess, MD  spironolactone (ALDACTONE) 25 MG tablet Take 1 tablet (25 mg total) by mouth 2 (two) times daily. 09/22/20  Yes Glean Hess, MD  Omega-3 Fatty Acids (FISH OIL PO) Take by mouth.    [provider]  fexofenadine (ALLEGRA) 180 MG tablet Take 180 mg by mouth daily.  03/28/19  [provider]    Allergies as of 09/04/2020 - Review Complete 08/14/2020  Allergen Reaction Noted  . Tramadol Shortness Of Breath 05/28/2017  . Meloxicam Rash 05/28/2017    Family History  Problem Relation Age of Onset  . Breast cancer Other 56  . Dementia Mother   . Cancer Father        prostate and bone  . Heart attack Sister     Social History   Socioeconomic History  . Marital status: Widowed    Spouse name: Not on file  . Number of children: 2  . Years of education: Not on file  . Highest education level: 12th grade  Occupational  History  . Occupation: retired  Tobacco Use  . Smoking status: Former Smoker    Packs/day: 0.50    Years: 29.00    Pack years: 14.50    Types: Cigarettes    Quit date: 09/28/1989    Years since quitting: 31.2  . Smokeless tobacco: Never Used  . Tobacco comment: smoking cessation materials not required  Vaping Use  . Vaping Use: Never used  Substance and Sexual Activity  . Alcohol use: No  . Drug use: No  . Sexual activity: Not Currently  Other Topics Concern  . Not on file  Social History Narrative   Pt lives alone   Social Determinants of Health   Financial Resource Strain: Low Risk   . Difficulty of Paying Living Expenses: Not hard at all  Food Insecurity: No Food Insecurity  . Worried About Charity fundraiser in the Last Year: Never true  . Ran Out of Food in the Last Year: Never true  Transportation Needs: No Transportation Needs  . Lack of Transportation (Medical): No  . Lack of Transportation (Non-Medical): No  Physical Activity: Inactive  . Days of Exercise per Week: 0 days  . Minutes of Exercise per Session: 0 min  Stress: No Stress Concern Present  . Feeling of Stress : Only a little  Social Connections: Moderately Isolated  . Frequency of Communication with Friends and Family: More than three times a week  . Frequency of Social Gatherings with Friends and Family: More than three times a week  . Attends Religious Services: More than 4 times per year  . Active Member of Clubs or Organizations: No  . Attends Archivist Meetings: Never  . Marital Status: Widowed  Intimate Partner Violence: Not At Risk  . Fear of Current or Ex-Partner: No  . Emotionally Abused: No  . Physically Abused: No  . Sexually Abused: No    Review of Systems: See HPI, otherwise negative ROS  Physical Exam: BP (!) 184/89   Pulse 62   Temp (!) 97.2 F (36.2 C) (Temporal)   Ht 5\' 7"  (1.702 m)   Wt 88 kg   SpO2 98%   BMI 30.38 kg/m  General:   Alert,  pleasant and  cooperative in NAD Head:  Normocephalic and atraumatic. Respiratory:  Normal work of breathing. Cardiovascular:  RRR  Impression/Plan: Ariana Guzman is here for cataract surgery.  Risks, benefits, limitations, and alternatives regarding cataract surgery have been reviewed with the patient.  Questions have been answered.  All parties agreeable.   Benay Pillow, MD  12/08/2020, 9:14 AM

## 2020-12-11 ENCOUNTER — Ambulatory Visit: Payer: Medicare Other | Admitting: Internal Medicine

## 2020-12-16 DIAGNOSIS — I739 Peripheral vascular disease, unspecified: Secondary | ICD-10-CM | POA: Diagnosis not present

## 2020-12-16 DIAGNOSIS — L851 Acquired keratosis [keratoderma] palmaris et plantaris: Secondary | ICD-10-CM | POA: Diagnosis not present

## 2020-12-16 DIAGNOSIS — B351 Tinea unguium: Secondary | ICD-10-CM | POA: Diagnosis not present

## 2020-12-25 ENCOUNTER — Other Ambulatory Visit: Payer: Self-pay

## 2020-12-25 ENCOUNTER — Ambulatory Visit (INDEPENDENT_AMBULATORY_CARE_PROVIDER_SITE_OTHER): Payer: Medicare Other | Admitting: Internal Medicine

## 2020-12-25 ENCOUNTER — Encounter: Payer: Self-pay | Admitting: Internal Medicine

## 2020-12-25 VITALS — BP 136/80 | HR 90 | Temp 97.6°F | Ht 67.0 in | Wt 197.0 lb

## 2020-12-25 DIAGNOSIS — E871 Hypo-osmolality and hyponatremia: Secondary | ICD-10-CM | POA: Diagnosis not present

## 2020-12-25 DIAGNOSIS — F411 Generalized anxiety disorder: Secondary | ICD-10-CM

## 2020-12-25 DIAGNOSIS — R7303 Prediabetes: Secondary | ICD-10-CM

## 2020-12-25 DIAGNOSIS — E039 Hypothyroidism, unspecified: Secondary | ICD-10-CM | POA: Diagnosis not present

## 2020-12-25 DIAGNOSIS — F5101 Primary insomnia: Secondary | ICD-10-CM

## 2020-12-25 DIAGNOSIS — I10 Essential (primary) hypertension: Secondary | ICD-10-CM | POA: Diagnosis not present

## 2020-12-25 DIAGNOSIS — K5901 Slow transit constipation: Secondary | ICD-10-CM | POA: Insufficient documentation

## 2020-12-25 MED ORDER — LEVOTHYROXINE SODIUM 75 MCG PO TABS
75.0000 ug | ORAL_TABLET | Freq: Every day | ORAL | 1 refills | Status: DC
Start: 2020-12-25 — End: 2021-06-30

## 2020-12-25 MED ORDER — SERTRALINE HCL 25 MG PO TABS: 25.0000 mg | ORAL_TABLET | Freq: Every day | ORAL | 1 refills | Status: DC

## 2020-12-25 MED ORDER — ALPRAZOLAM 0.25 MG PO TABS: 0.2500 mg | ORAL_TABLET | Freq: Every day | ORAL | 3 refills | Status: DC

## 2020-12-25 MED ORDER — METOPROLOL TARTRATE 25 MG PO TABS: 25.0000 mg | ORAL_TABLET | Freq: Three times a day (TID) | ORAL | 1 refills | Status: DC

## 2020-12-25 NOTE — Progress Notes (Signed)
Date:  12/25/2020   Name:  Ariana Guzman   DOB:  Mar 07, 1941   MRN:  [id number]   Chief Complaint: Anxiety, Hypertension, and Diabetes  Hypertension This is a chronic problem. The problem is controlled. Associated symptoms include anxiety. Pertinent negatives include no chest pain, headaches, palpitations or shortness of breath. Past treatments include angiotensin blockers, calcium channel blockers and diuretics. The current treatment provides significant improvement. There are no compliance problems.  Identifiable causes of hypertension include a thyroid problem.  Diabetes She presents for her follow-up diabetic visit. Diabetes type: prediabetes. Her disease course has been stable. Hypoglycemia symptoms include nervousness/anxiousness (improved with addition of Zoloft). Pertinent negatives for hypoglycemia include no dizziness or headaches. Pertinent negatives for diabetes include no chest pain, no fatigue and no weakness. Current diabetic treatment includes diet. She is compliant with treatment some of the time.  Thyroid Problem Presents for follow-up visit. Symptoms include anxiety (improved with addition of Zoloft) and constipation (taking stool softeners and mild laxative). Patient reports no diarrhea, fatigue or palpitations.  Anxiety Presents for follow-up visit. Symptoms include nervous/anxious behavior (improved with addition of Zoloft). Patient reports no chest pain, decreased concentration, dizziness, palpitations or shortness of breath. Symptoms occur most days. The severity of symptoms is moderate.   Compliance with medications is 76-100% (started zoloft last visit).  Constipation This is a chronic problem. The problem has been rapidly improving since onset. Her stool frequency is 1 time per day. The stool is described as firm. The patient is not on a high fiber diet. She exercises regularly. There has been adequate water intake. Pertinent negatives include no abdominal pain or  diarrhea. She has tried laxatives and stool softeners for the symptoms. The treatment provided significant relief.  Hyponatremia - felt to be worsened by HCTZ.  This was stopped and spironolactone started.   Lab Results  Component Value Date   CREATININE 0.87 08/14/2020   BUN 12 08/14/2020   NA 131 (L) 08/14/2020   K 4.5 08/14/2020   CL 94 (L) 08/14/2020   CO2 24 08/14/2020   Lab Results  Component Value Date   CHOL 174 05/28/2019   HDL 59 05/28/2019   LDLCALC 99 05/28/2019   TRIG 84 05/28/2019   CHOLHDL 2.9 05/28/2019   Lab Results  Component Value Date   TSH 2.636 06/05/2020   Lab Results  Component Value Date   HGBA1C 6.3 (H) 06/05/2020   Lab Results  Component Value Date   WBC 8.0 06/05/2020   HGB 12.7 06/05/2020   HCT 38.6 06/05/2020   MCV 90.8 06/05/2020   PLT 239 06/05/2020   Lab Results  Component Value Date   ALT 17 06/05/2020   AST 18 06/05/2020   ALKPHOS 54 06/05/2020   BILITOT 1.2 06/05/2020    Review of Systems  Constitutional: Negative for appetite change, fatigue and unexpected weight change.  HENT: Negative for nosebleeds.   Eyes: Negative for visual disturbance.  Respiratory: Negative for cough, chest tightness, shortness of breath and wheezing.   Cardiovascular: Negative for chest pain, palpitations and leg swelling.  Gastrointestinal: Positive for constipation (taking stool softeners and mild laxative). Negative for abdominal pain and diarrhea.  Skin: Negative for color change and rash.  Neurological: Negative for dizziness, weakness, light-headedness and headaches.  Psychiatric/Behavioral: Negative for decreased concentration, dysphoric mood and sleep disturbance. The patient is nervous/anxious (improved with addition of Zoloft).     Patient Active Problem List   Diagnosis Date Noted  . Bursitis of  hip 06/05/2020  . Acquired thrombophilia ([place]) 06/05/2020  . Hyponatremia 04/05/2020  . Environmental and seasonal allergies 04/03/2020  .  Pre-diabetes 05/29/2018  . Primary insomnia 09/15/2017  . Anxiety, generalized 09/15/2017  . Essential hypertension 09/09/2017  . 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 Bilateral lower extremity edema 09/09/2017  . Current use of long term anticoagulation 07/28/2017  . 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 Otitis externa, chronic 12/03/2015  . Obstructive sleep apnea 10/07/2015  . Asthma, persistent controlled 09/25/2015  . Acquired hypothyroidism 02/29/2012  . Chronic atrial fibrillation ([place]) 02/29/2012  . Hyperlipidemia 02/29/2012    Allergies  Allergen Reactions  . Tramadol Shortness Of Breath  . Meloxicam Rash    Past Surgical History:  Procedure Laterality Date  . ABDOMINAL HYSTERECTOMY  1991   uterus only- still has cervix - done for prolapse  . CATARACT EXTRACTION W/PHACO Left 11/24/2020   Procedure: CATARACT EXTRACTION PHACO AND INTRAOCULAR LENS PLACEMENT (IOC) LEFT 6.99 00:46.1;  Surgeon: Eulogio Bear, MD;  Location: [place];  Service: Ophthalmology;  Laterality: Left;  . CATARACT EXTRACTION W/PHACO Right 12/08/2020   Procedure: CATARACT EXTRACTION PHACO AND INTRAOCULAR LENS PLACEMENT (IOC) RIGHT 5.13 00:34.2;  Surgeon: Eulogio Bear, MD;  Location: [place];  Service: Ophthalmology;  Laterality: Right;  . COLONOSCOPY  2009   normal  . TOE SURGERY      Social History   Tobacco Use  . Smoking status: Former Smoker    Packs/day: 0.50    Years: 29.00    Pack years: 14.50    Types: Cigarettes    Quit date: 09/28/1989    Years since quitting: 31.2  . Smokeless tobacco: Never Used  . Tobacco comment: smoking cessation materials not required  Vaping Use  . Vaping Use: Never used  Substance Use Topics  . Alcohol use: No  . Drug use: No     Medication list has been reviewed and updated.  Current Meds  Medication Sig  . albuterol (PROVENTIL) (2.5 MG/3ML) 0.083% nebulizer  solution Take 2.5 mg by nebulization every 6 (six) hours as needed.  . ALPRAZolam (XANAX) 0.25 MG tablet Take 1 tablet (0.25 mg total) by mouth at bedtime.  . Ascorbic Acid (VITAMIN C) 1000 MG tablet Take 1,000 mg by mouth daily.  . Calcium Carb-Cholecalciferol [phone] MG-UNIT TABS Take by mouth.  . cetirizine (ZYRTEC) 10 MG tablet Take 10 mg by mouth 2 (two) times daily.  . cholecalciferol (VITAMIN D) 1000 units tablet Take 1,000 Units by mouth daily.  . EUTHYROX 75 MCG tablet TAKE 1 TABLET BY MOUTH ONCE DAILY BEFORE BREAKFAST  . Fluticasone-Salmeterol (ADVAIR) 250-50 MCG/DOSE AEPB Inhale 1 puff into the lungs 2 (two) times daily.  Marland Kitchen latanoprost (XALATAN) 0.005 % ophthalmic solution 1 drop at bedtime.  Marland Kitchen losartan (COZAAR) 100 MG tablet Take 1 tablet (100 mg total) by mouth daily.  . Magnesium 100 MG CAPS Take by mouth.  Marland Kitchen MAGNESIUM-ZINC PO Take 500 mg by mouth at bedtime as needed.  Marland Kitchen MELATONIN PO Take by mouth.  . metoprolol tartrate (LOPRESSOR) 25 MG tablet Three times a day  . montelukast (SINGULAIR) 10 MG tablet Take 10 mg by mouth daily.  . Multiple Vitamins-Minerals (OCUVITE EYE HEATLH GUMMIES PO) Take by mouth daily.  . Omega-3 Fatty Acids (FISH OIL PO) Take by mouth.  . Rivaroxaban (XARELTO) 15 MG TABS tablet Take 15 mg by mouth daily with supper.   . sertraline (ZOLOFT) 25  MG tablet Take 1 tablet (25 mg total) by mouth at bedtime.  Marland Kitchen spironolactone (ALDACTONE) 25 MG tablet Take 1 tablet (25 mg total) by mouth 2 (two) times daily.    PHQ 2/9 Scores 12/25/2020 09/17/2020 08/14/2020 08/06/2020  PHQ - 2 Score 0 0 0 1  PHQ- 9 Score 0 0 0 1    GAD 7 : Generalized Anxiety Score 12/25/2020 09/17/2020 08/14/2020 08/06/2020  Nervous, Anxious, on Edge 0 3 1 1   Control/stop worrying 1 1 1 1   Worry too much - different things 1 1 1 1   Trouble relaxing 0 0 0 0  Restless 0 0 0 0  Easily annoyed or irritable 0 0 0 0  Afraid - awful might happen 0 0 0 0  Total GAD 7 Score 2 5 3 3   Anxiety  Difficulty - - - -    BP Readings from Last 3 Encounters:  12/25/20 136/80  12/08/20 (!) 154/93  11/24/20 (!) 158/68    Physical Exam Vitals and nursing note reviewed.  Constitutional:      General: She is not in acute distress.    Appearance: Normal appearance. She is well-developed.  HENT:     Head: Normocephalic and atraumatic.  Cardiovascular:     Rate and Rhythm: Normal rate and regular rhythm.     Pulses: Normal pulses.     Heart sounds: No murmur heard.   Pulmonary:     Effort: Pulmonary effort is normal. No respiratory distress.     Breath sounds: No wheezing or rhonchi.  Musculoskeletal:     Cervical back: Normal range of motion.     Right lower leg: No edema.     Left lower leg: No edema.  Lymphadenopathy:     Cervical: No cervical adenopathy.  Skin:    General: Skin is warm and dry.     Capillary Refill: Capillary refill takes less than 2 seconds.     Findings: No rash.  Neurological:     General: No focal deficit present.     Mental Status: She is alert and oriented to person, place, and time.  Psychiatric:        Mood and Affect: Mood normal.        Behavior: Behavior normal.        Thought Content: Thought content normal.     Wt Readings from Last 3 Encounters:  12/25/20 197 lb (89.4 kg)  12/08/20 194 lb (88 kg)  11/24/20 193 lb (87.5 kg)    BP 136/80   Pulse 90   Temp 97.6 F (36.4 C) (Oral)   Ht 5\' 7"  (1.702 m)   Wt 197 lb (89.4 kg)   SpO2 97%   BMI 30.85 kg/m   Assessment and Plan: 1. Essential hypertension Clinically stable exam with well controlled BP. Tolerating medications without side effects at this time. Pt to continue current regimen; benefits of regular exercise as able discussed. Will monitor renal function and sodium - metoprolol tartrate (LOPRESSOR) 25 MG tablet; Take 1 tablet (25 mg total) by mouth in the morning, at noon, and at bedtime. Three times a day  Dispense: 270 tablet; Refill: 1  2. Pre-diabetes Patient  admits to not following a low carb diet at present. Modifications discussed. - Hemoglobin A1c  3. Hyponatremia - Basic metabolic panel  4. Acquired hypothyroidism supplemented - levothyroxine (EUTHYROX) 75 MCG tablet; Take 1 tablet (75 mcg total) by mouth daily before breakfast.  Dispense: 90 tablet; Refill: 1  5. Generalized anxiety  disorder Anxiety is much improved with low dose Zoloft. No side effects noted, no SI/HI Will continue current dose - sertraline (ZOLOFT) 25 MG tablet; Take 1 tablet (25 mg total) by mouth at bedtime.  Dispense: 90 tablet; Refill: 1  6. Slow transit constipation Over the counter management with stool softeners and mild laxative appropriate  7. Primary insomnia - ALPRAZolam (XANAX) 0.25 MG tablet; Take 1 tablet (0.25 mg total) by mouth at bedtime.  Dispense: 30 tablet; Refill: 3   Partially dictated using Editor, commissioning. Any errors are unintentional.  Halina Maidens, MD [facility]  12/25/2020

## 2020-12-26 LAB — BASIC METABOLIC PANEL
BUN/Creatinine Ratio: 21 (ref 12–28)
BUN: 16 mg/dL (ref 8–27)
CO2: 23 mmol/L (ref 20–29)
Calcium: 9.1 mg/dL (ref 8.7–10.3)
Chloride: 97 mmol/L (ref 96–106)
Creatinine, Ser: 0.76 mg/dL (ref 0.57–1.00)
Glucose: 107 mg/dL — ABNORMAL HIGH (ref 65–99)
Potassium: 4.9 mmol/L (ref 3.5–5.2)
Sodium: 134 mmol/L (ref 134–144)
eGFR: 79 mL/min/{1.73_m2} (ref >59)

## 2020-12-26 LAB — HEMOGLOBIN A1C
Est. average glucose Bld gHb Est-mCnc: 134 mg/dL
Hgb A1c MFr Bld: 6.3 % — ABNORMAL HIGH (ref 4.8–5.6)

## 2021-01-01 DIAGNOSIS — M1611 Unilateral primary osteoarthritis, right hip: Secondary | ICD-10-CM | POA: Diagnosis not present

## 2021-01-01 DIAGNOSIS — M25551 Pain in right hip: Secondary | ICD-10-CM | POA: Diagnosis not present

## 2021-01-15 DIAGNOSIS — G4733 Obstructive sleep apnea (adult) (pediatric): Secondary | ICD-10-CM | POA: Diagnosis not present

## 2021-01-22 DIAGNOSIS — M1611 Unilateral primary osteoarthritis, right hip: Secondary | ICD-10-CM | POA: Diagnosis not present

## 2021-01-22 DIAGNOSIS — M25551 Pain in right hip: Secondary | ICD-10-CM | POA: Diagnosis not present

## 2021-01-22 DIAGNOSIS — R29818 Other symptoms and signs involving the nervous system: Secondary | ICD-10-CM | POA: Diagnosis not present

## 2021-01-22 DIAGNOSIS — M5441 Lumbago with sciatica, right side: Secondary | ICD-10-CM | POA: Diagnosis not present

## 2021-01-23 ENCOUNTER — Other Ambulatory Visit: Payer: Self-pay | Admitting: Internal Medicine

## 2021-01-23 DIAGNOSIS — F5101 Primary insomnia: Secondary | ICD-10-CM

## 2021-01-23 NOTE — Telephone Encounter (Signed)
Requested medication (s) are due for refill today: yes   Requested medication (s) are on the active medication list: yes   Last refill: 12/22/2020  Future visit scheduled: yes   Notes to clinic: this refill cannot be delegated    Requested Prescriptions  Pending Prescriptions Disp Refills   ALPRAZolam (XANAX) 0.25 MG tablet [Pharmacy Med Name: ALPRAZolam 0.25 MG Oral Tablet] 30 tablet 0    Sig: TAKE 1 TABLET BY MOUTH AT BEDTIME      Not Delegated - Psychiatry:  Anxiolytics/Hypnotics Failed - 01/23/2021  9:08 AM      Failed - This refill cannot be delegated      Failed - Urine Drug Screen completed in last 360 days      Passed - Valid encounter within last 6 months    Recent Outpatient Visits           4 weeks ago Essential hypertension   [place], Laura H, MD   4 months ago Essential hypertension   [place], Laura H, MD   5 months ago Essential hypertension   [place], Laura H, MD   5 months ago Essential hypertension   [facility] Glean Hess, MD   7 months ago Annual physical exam   [facility] Glean Hess, MD       Future Appointments             In 4 months [place] Melia Jesse Sans, MD [facility]

## 2021-01-27 DIAGNOSIS — L720 Epidermal cyst: Secondary | ICD-10-CM | POA: Diagnosis not present

## 2021-01-27 DIAGNOSIS — D485 Neoplasm of uncertain behavior of skin: Secondary | ICD-10-CM | POA: Diagnosis not present

## 2021-01-27 DIAGNOSIS — D224 Melanocytic nevi of scalp and neck: Secondary | ICD-10-CM | POA: Diagnosis not present

## 2021-01-27 DIAGNOSIS — D239 Other benign neoplasm of skin, unspecified: Secondary | ICD-10-CM | POA: Diagnosis not present

## 2021-02-12 DIAGNOSIS — M25551 Pain in right hip: Secondary | ICD-10-CM | POA: Diagnosis not present

## 2021-02-12 DIAGNOSIS — M6281 Muscle weakness (generalized): Secondary | ICD-10-CM | POA: Diagnosis not present

## 2021-02-12 DIAGNOSIS — M5441 Lumbago with sciatica, right side: Secondary | ICD-10-CM | POA: Diagnosis not present

## 2021-02-17 DIAGNOSIS — M25551 Pain in right hip: Secondary | ICD-10-CM | POA: Diagnosis not present

## 2021-02-19 DIAGNOSIS — G4733 Obstructive sleep apnea (adult) (pediatric): Secondary | ICD-10-CM | POA: Diagnosis not present

## 2021-02-19 DIAGNOSIS — J45998 Other asthma: Secondary | ICD-10-CM | POA: Diagnosis not present

## 2021-02-26 DIAGNOSIS — M25551 Pain in right hip: Secondary | ICD-10-CM | POA: Diagnosis not present

## 2021-03-24 ENCOUNTER — Other Ambulatory Visit: Payer: Self-pay | Admitting: Internal Medicine

## 2021-03-24 DIAGNOSIS — I10 Essential (primary) hypertension: Secondary | ICD-10-CM

## 2021-03-31 DIAGNOSIS — I739 Peripheral vascular disease, unspecified: Secondary | ICD-10-CM | POA: Diagnosis not present

## 2021-03-31 DIAGNOSIS — B351 Tinea unguium: Secondary | ICD-10-CM | POA: Diagnosis not present

## 2021-03-31 DIAGNOSIS — D2372 Other benign neoplasm of skin of left lower limb, including hip: Secondary | ICD-10-CM | POA: Diagnosis not present

## 2021-04-16 ENCOUNTER — Encounter: Payer: Self-pay | Admitting: Internal Medicine

## 2021-04-16 ENCOUNTER — Other Ambulatory Visit: Payer: Self-pay

## 2021-04-16 ENCOUNTER — Ambulatory Visit (INDEPENDENT_AMBULATORY_CARE_PROVIDER_SITE_OTHER): Payer: Medicare Other | Admitting: Internal Medicine

## 2021-04-16 VITALS — BP 130/86 | HR 60 | Temp 98.0°F | Ht 67.0 in | Wt 204.0 lb

## 2021-04-16 DIAGNOSIS — R262 Difficulty in walking, not elsewhere classified: Secondary | ICD-10-CM | POA: Diagnosis not present

## 2021-04-16 DIAGNOSIS — B354 Tinea corporis: Secondary | ICD-10-CM | POA: Diagnosis not present

## 2021-04-16 DIAGNOSIS — M79606 Pain in leg, unspecified: Secondary | ICD-10-CM | POA: Diagnosis not present

## 2021-04-16 DIAGNOSIS — L723 Sebaceous cyst: Secondary | ICD-10-CM | POA: Diagnosis not present

## 2021-04-16 MED ORDER — FLUCONAZOLE 100 MG PO TABS: 100.0000 mg | ORAL_TABLET | ORAL | 0 refills | Status: AC

## 2021-04-16 MED ORDER — CLOTRIMAZOLE-BETAMETHASONE 1-0.05 % EX CREA: 1.0000 "application " | TOPICAL_CREAM | Freq: Two times a day (BID) | CUTANEOUS | 0 refills | Status: DC

## 2021-04-16 NOTE — Progress Notes (Signed)
Date:  04/16/2021   Name:  Ariana Guzman   DOB:  [birth year]   MRN:  [id number]   Chief Complaint: Rash (X3 weeks, Under both breast, redness, no itching, does burn sometimes) and Mass (On back, X1 month ago, not getting bigger, not painful )  Rash This is a new problem. The current episode started 1 to 4 weeks ago. The problem is unchanged. The affected locations include the chest. Pertinent negatives include no fatigue, fever or shortness of breath.  Hip Pain  There was no injury mechanism. The pain is present in the right hip. The quality of the pain is described as aching. Associated symptoms include an inability to bear weight and muscle weakness. Treatments tried: PT and wants to go to [place].   Lab Results  Component Value Date   CREATININE 0.76 12/25/2020   BUN 16 12/25/2020   NA 134 12/25/2020   K 4.9 12/25/2020   CL 97 12/25/2020   CO2 23 12/25/2020   Lab Results  Component Value Date   CHOL 174 05/28/2019   HDL 59 05/28/2019   LDLCALC 99 05/28/2019   TRIG 84 05/28/2019   CHOLHDL 2.9 05/28/2019   Lab Results  Component Value Date   TSH 2.636 06/05/2020   Lab Results  Component Value Date   HGBA1C 6.3 (H) 12/25/2020   Lab Results  Component Value Date   WBC 8.0 06/05/2020   HGB 12.7 06/05/2020   HCT 38.6 06/05/2020   MCV 90.8 06/05/2020   PLT 239 06/05/2020   Lab Results  Component Value Date   ALT 17 06/05/2020   AST 18 06/05/2020   ALKPHOS 54 06/05/2020   BILITOT 1.2 06/05/2020     Review of Systems  Constitutional:  Negative for chills, fatigue and fever.  Respiratory:  Negative for chest tightness and shortness of breath.   Musculoskeletal:  Positive for arthralgias and gait problem.  Skin:  Positive for rash.  Neurological:  Positive for weakness.   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/2020   Hyponatremia 04/05/2020    Environmental and seasonal allergies 04/03/2020   Pre-diabetes 05/29/2018   Primary insomnia 09/15/2017   Generalized anxiety disorder 09/15/2017   Essential hypertension 09/09/2017   Varicose veins of both lower extremities with pain 09/09/2017   Bilateral lower extremity edema 09/09/2017   Current use of long term anticoagulation 07/28/2017   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/03/2015   Otitis externa, chronic 12/03/2015   Obstructive sleep apnea 10/07/2015   Asthma, persistent controlled 09/25/2015   Acquired hypothyroidism 02/29/2012   Chronic atrial fibrillation ([place]) 02/29/2012   Hyperlipidemia 02/29/2012    Allergies  Allergen Reactions   Tramadol Shortness Of Breath   Meloxicam Rash    Past Surgical History:  Procedure Laterality Date   ABDOMINAL HYSTERECTOMY  1991   uterus only- still has cervix - done for prolapse   CATARACT EXTRACTION W/PHACO Left 11/24/2020   Procedure: CATARACT EXTRACTION PHACO AND INTRAOCULAR LENS PLACEMENT (IOC) LEFT 6.99 00:46.1;  Surgeon: Eulogio Bear, MD;  Location: [facility];  Service: Ophthalmology;  Laterality: Left;   CATARACT EXTRACTION W/PHACO Right 12/08/2020   Procedure: CATARACT EXTRACTION PHACO AND INTRAOCULAR LENS PLACEMENT (IOC) RIGHT 5.13 00:34.2;  Surgeon: Eulogio Bear, MD;  Location: [facility];  Service: Ophthalmology;  Laterality: Right;   COLONOSCOPY  2009   normal  TOE SURGERY      Social History   Tobacco Use   Smoking status: Former    Packs/day: 0.50    Years: 29.00    Pack years: 14.50    Types: Cigarettes    Quit date: 09/28/1989    Years since quitting: 31.5   Smokeless tobacco: Never   Tobacco comments:    smoking cessation materials not required  Vaping Use   Vaping Use: Never used  Substance Use Topics   Alcohol use: No   Drug use: No     Medication list has been reviewed and updated.  Current Meds  Medication Sig   albuterol (PROVENTIL) (2.5  MG/3ML) 0.083% nebulizer solution Take 2.5 mg by nebulization every 6 (six) hours as needed.   ALPRAZolam (XANAX) 0.25 MG tablet Take 1 tablet (0.25 mg total) by mouth at bedtime.   Ascorbic Acid (VITAMIN C) 1000 MG tablet Take 1,000 mg by mouth daily.   Calcium Carb-Cholecalciferol [phone] MG-UNIT TABS Take by mouth.   cetirizine (ZYRTEC) 10 MG tablet Take 10 mg by mouth 2 (two) times daily.   cholecalciferol (VITAMIN D) 1000 units tablet Take 1,000 Units by mouth daily.   clotrimazole-betamethasone (LOTRISONE) cream Apply 1 application topically 2 (two) times daily. To rash under breast   fluconazole (DIFLUCAN) 100 MG tablet Take 1 tablet (100 mg total) by mouth every other day for 6 days.   Fluticasone-Salmeterol (ADVAIR) 250-50 MCG/DOSE AEPB Inhale 1 puff into the lungs 2 (two) times daily.   latanoprost (XALATAN) 0.005 % ophthalmic solution 1 drop at bedtime.   levothyroxine (EUTHYROX) 75 MCG tablet Take 1 tablet (75 mcg total) by mouth daily before breakfast.   losartan (COZAAR) 100 MG tablet Take 1 tablet by mouth once daily   Magnesium 100 MG CAPS Take by mouth.   MAGNESIUM-ZINC PO Take 500 mg by mouth at bedtime as needed.   MELATONIN PO Take by mouth.   metoprolol tartrate (LOPRESSOR) 25 MG tablet Take 1 tablet (25 mg total) by mouth in the morning, at noon, and at bedtime. Three times a day   montelukast (SINGULAIR) 10 MG tablet Take 10 mg by mouth daily.   Multiple Vitamins-Minerals (OCUVITE EYE HEATLH GUMMIES PO) Take by mouth daily.   Omega-3 Fatty Acids (FISH OIL PO) Take by mouth.   Rivaroxaban (XARELTO) 15 MG TABS tablet Take 15 mg by mouth daily with supper.    sertraline (ZOLOFT) 25 MG tablet Take 1 tablet (25 mg total) by mouth at bedtime.   spironolactone (ALDACTONE) 25 MG tablet Take 1 tablet by mouth twice daily    PHQ 2/9 Scores 04/16/2021 12/25/2020 09/17/2020 08/14/2020  PHQ - 2 Score 0 0 0 0  PHQ- 9 Score 0 0 0 0    GAD 7 : Generalized Anxiety Score 04/16/2021  12/25/2020 09/17/2020 08/14/2020  Nervous, Anxious, on Edge 0 0 3 1  Control/stop worrying 0 '1 1 1  '$ Worry too much - different things 0 '1 1 1  '$ Trouble relaxing 0 0 0 0  Restless 0 0 0 0  Easily annoyed or irritable 0 0 0 0  Afraid - awful might happen 0 0 0 0  Total GAD 7 Score 0 '2 5 3  '$ Anxiety Difficulty - - - -    BP Readings from Last 3 Encounters:  04/16/21 130/86  12/25/20 136/80  12/08/20 (!) 154/93    Physical Exam Vitals and nursing note reviewed.  Constitutional:      General: She is not in  acute distress.    Appearance: She is well-developed.  HENT:     Head: Normocephalic and atraumatic.  Pulmonary:     Effort: Pulmonary effort is normal. No respiratory distress.  Skin:    General: Skin is warm and dry.     Findings: No rash.       Neurological:     Mental Status: She is alert and oriented to person, place, and time.  Psychiatric:        Mood and Affect: Mood normal.        Behavior: Behavior normal.    Wt Readings from Last 3 Encounters:  04/16/21 204 lb (92.5 kg)  12/25/20 197 lb (89.4 kg)  12/08/20 194 lb (88 kg)    BP 130/86   Pulse 60   Temp 98 F (36.7 C) (Oral)   Ht '5\' 7"'$  (1.702 m)   Wt 204 lb (92.5 kg)   SpO2 95%   BMI 31.95 kg/m   Assessment and Plan: 1. Tinea corporis Keep area clean and dry Apply powder over the cream - clotrimazole-betamethasone (LOTRISONE) cream; Apply 1 application topically 2 (two) times daily. To rash under breast  Dispense: 30 g; Refill: 0 - fluconazole (DIFLUCAN) 100 MG tablet; Take 1 tablet (100 mg total) by mouth every other day for 6 days.  Dispense: 3 tablet; Refill: 0  2. Sebaceous cyst Benign - no treatment is needed  3. Pain of lower extremity, unspecified laterality Right hip pain since a fall in 2018 - Ambulatory referral to Physical Therapy  4. Ambulatory dysfunction - Ambulatory referral to Physical Therapy   Partially dictated using Dragon software. Any errors are unintentional.  Halina Maidens, MD [facility]  04/16/2021

## 2021-05-13 ENCOUNTER — Ambulatory Visit (INDEPENDENT_AMBULATORY_CARE_PROVIDER_SITE_OTHER): Payer: Medicare Other

## 2021-05-13 DIAGNOSIS — Z1231 Encounter for screening mammogram for malignant neoplasm of breast: Secondary | ICD-10-CM

## 2021-05-13 DIAGNOSIS — Z0000 Encounter for general adult medical examination without abnormal findings: Secondary | ICD-10-CM

## 2021-05-13 NOTE — Patient Instructions (Signed)
Ms. Zavadil , Thank you for taking time to come for your Medicare Wellness Visit. I appreciate your ongoing commitment to your health goals. Please review the following plan we discussed and let me know if I can assist you in the future.   Screening recommendations/referrals: Colonoscopy: no longer required Mammogram: done 08/02/18. Please call [phone] to schedule your mammogram.  Bone Density: no longer required Recommended yearly ophthalmology/optometry visit for glaucoma screening and checkup Recommended yearly dental visit for hygiene and checkup  Vaccinations: Influenza vaccine: due Pneumococcal vaccine: done 05/28/19 Tdap vaccine: done 02/26/15 Shingles vaccine: Shingrix discussed. Please contact your pharmacy for coverage information.  Covid-19:done 10/08/19, 10/29/19 & 07/17/20  Advanced directives: Advance directive discussed with you today. Even though you declined this today please call our office should you change your mind and we can give you the proper paperwork for you to fill out.   Conditions/risks identified: Recommend increasing activity as tolerated  Next appointment: Follow up in one year for your annual wellness visit    Preventive Care 65 Years and Older, Female Preventive care refers to lifestyle choices and visits with your health care provider that can promote health and wellness. What does preventive care include? A yearly physical exam. This is also called an annual well check. Dental exams once or twice a year. Routine eye exams. Ask your health care provider how often you should have your eyes checked. Personal lifestyle choices, including: Daily care of your teeth and gums. Regular physical activity. Eating a healthy diet. Avoiding tobacco and drug use. Limiting alcohol use. Practicing safe sex. Taking low-dose aspirin every day. Taking vitamin and mineral supplements as recommended by your health care provider. What happens during an annual well  check? The services and screenings done by your health care provider during your annual well check will depend on your age, overall health, lifestyle risk factors, and family history of disease. Counseling  Your health care provider may ask you questions about your: Alcohol use. Tobacco use. Drug use. Emotional well-being. Home and relationship well-being. Sexual activity. Eating habits. History of falls. Memory and ability to understand (cognition). Work and work Statistician. Reproductive health. Screening  You may have the following tests or measurements: Height, weight, and BMI. Blood pressure. Lipid and cholesterol levels. These may be checked every 5 years, or more frequently if you are over 42 years old. Skin check. Lung cancer screening. You may have this screening every year starting at age 41 if you have a 30-pack-year history of smoking and currently smoke or have quit within the past 15 years. Fecal occult blood test (FOBT) of the stool. You may have this test every year starting at age 56. Flexible sigmoidoscopy or colonoscopy. You may have a sigmoidoscopy every 5 years or a colonoscopy every 10 years starting at age 43. Hepatitis C blood test. Hepatitis B blood test. Sexually transmitted disease (STD) testing. Diabetes screening. This is done by checking your blood sugar (glucose) after you have not eaten for a while (fasting). You may have this done every 1-3 years. Bone density scan. This is done to screen for osteoporosis. You may have this done starting at age 4. Mammogram. This may be done every 1-2 years. Talk to your health care provider about how often you should have regular mammograms. Talk with your health care provider about your test results, treatment options, and if necessary, the need for more tests. Vaccines  Your health care provider may recommend certain vaccines, such as: Influenza vaccine. This is  recommended every year. Tetanus, diphtheria, and  acellular pertussis (Tdap, Td) vaccine. You may need a Td booster every 10 years. Zoster vaccine. You may need this after age 39. Pneumococcal 13-valent conjugate (PCV13) vaccine. One dose is recommended after age 60. Pneumococcal polysaccharide (PPSV23) vaccine. One dose is recommended after age 33. Talk to your health care provider about which screenings and vaccines you need and how often you need them. This information is not intended to replace advice given to you by your health care provider. Make sure you discuss any questions you have with your health care provider. Document Released: 08/29/2015 Document Revised: 04/21/2016 Document Reviewed: 06/03/2015 Elsevier Interactive Patient Education  2017 [place] Prevention in the Home Falls can cause injuries. They can happen to people of all ages. There are many things you can do to make your home safe and to help prevent falls. What can I do on the outside of my home? Regularly fix the edges of walkways and driveways and fix any cracks. Remove anything that might make you trip as you walk through a door, such as a raised step or threshold. Trim any bushes or trees on the path to your home. Use bright outdoor lighting. Clear any walking paths of anything that might make someone trip, such as rocks or tools. Regularly check to see if handrails are loose or broken. Make sure that both sides of any steps have handrails. Any raised decks and porches should have guardrails on the edges. Have any leaves, snow, or ice cleared regularly. Use sand or salt on walking paths during winter. Clean up any spills in your garage right away. This includes oil or grease spills. What can I do in the bathroom? Use night lights. Install grab bars by the toilet and in the tub and shower. Do not use towel bars as grab bars. Use non-skid mats or decals in the tub or shower. If you need to sit down in the shower, use a plastic, non-slip stool. Keep  the floor dry. Clean up any water that spills on the floor as soon as it happens. Remove soap buildup in the tub or shower regularly. Attach bath mats securely with double-sided non-slip rug tape. Do not have throw rugs and other things on the floor that can make you trip. What can I do in the bedroom? Use night lights. Make sure that you have a light by your bed that is easy to reach. Do not use any sheets or blankets that are too big for your bed. They should not hang down onto the floor. Have a firm chair that has side arms. You can use this for support while you get dressed. Do not have throw rugs and other things on the floor that can make you trip. What can I do in the kitchen? Clean up any spills right away. Avoid walking on wet floors. Keep items that you use a lot in easy-to-reach places. If you need to reach something above you, use a strong step stool that has a grab bar. Keep electrical cords out of the way. Do not use floor polish or wax that makes floors slippery. If you must use wax, use non-skid floor wax. Do not have throw rugs and other things on the floor that can make you trip. What can I do with my stairs? Do not leave any items on the stairs. Make sure that there are handrails on both sides of the stairs and use them. Fix handrails that are  broken or loose. Make sure that handrails are as long as the stairways. Check any carpeting to make sure that it is firmly attached to the stairs. Fix any carpet that is loose or worn. Avoid having throw rugs at the top or bottom of the stairs. If you do have throw rugs, attach them to the floor with carpet tape. Make sure that you have a light switch at the top of the stairs and the bottom of the stairs. If you do not have them, ask someone to add them for you. What else can I do to help prevent falls? Wear shoes that: Do not have high heels. Have rubber bottoms. Are comfortable and fit you well. Are closed at the toe. Do not  wear sandals. If you use a stepladder: Make sure that it is fully opened. Do not climb a closed stepladder. Make sure that both sides of the stepladder are locked into place. Ask someone to hold it for you, if possible. Clearly mark and make sure that you can see: Any grab bars or handrails. First and last steps. Where the edge of each step is. Use tools that help you move around (mobility aids) if they are needed. These include: Canes. Walkers. Scooters. Crutches. Turn on the lights when you go into a dark area. Replace any light bulbs as soon as they burn out. Set up your furniture so you have a clear path. Avoid moving your furniture around. If any of your floors are uneven, fix them. If there are any pets around you, be aware of where they are. Review your medicines with your doctor. Some medicines can make you feel dizzy. This can increase your chance of falling. Ask your doctor what other things that you can do to help prevent falls. This information is not intended to replace advice given to you by your health care provider. Make sure you discuss any questions you have with your health care provider. Document Released: 05/29/2009 Document Revised: 01/08/2016 Document Reviewed: 09/06/2014 Elsevier Interactive Patient Education  2017 [employer].

## 2021-05-13 NOTE — Progress Notes (Signed)
Subjective:   Ariana Guzman is a 80 y.o. female who presents for Medicare Annual (Subsequent) preventive examination.  Virtual Visit via Telephone Note  I connected with  Ariana Guzman on 05/13/21 at 11:20 AM EDT by telephone and verified that I am speaking with the correct person using two identifiers.  Location: Patient: home Provider: [facility] Persons participating in the virtual visit: [place]   I discussed the limitations, risks, security and privacy concerns of performing an evaluation and management service by telephone and the availability of in person appointments. The patient expressed understanding and agreed to proceed.  Interactive audio and video telecommunications were attempted between this nurse and patient, however failed, due to patient having technical difficulties OR patient did not have access to video capability.  We continued and completed visit with audio only.  Some vital signs may be absent or patient reported.   Ariana Marker, LPN    Review of Systems     Cardiac Risk Factors include: advanced age (>49men, >28 women);hypertension;obesity (BMI >30kg/m2)     Objective:    Today's Vitals   05/13/21 1131  PainSc: 4    There is no height or weight on file to calculate BMI.  Advanced Directives 05/13/2021 12/08/2020 11/24/2020 05/12/2020 03/31/2020 03/23/2020 03/13/2020  Does Patient Have a Medical Advance Directive? No No No No No No No  Does patient want to make changes to medical advance directive? - No - Patient declined No - Patient declined - - - -  Would patient like information on creating a medical advance directive? No - Patient declined No - Patient declined - No - Patient declined - - -    Current Medications (verified) Outpatient Encounter Medications as of 05/13/2021  Medication Sig   albuterol (PROVENTIL) (2.5 MG/3ML) 0.083% nebulizer solution Take 2.5 mg by nebulization every 6 (six) hours as needed.   ALPRAZolam (XANAX) 0.25  MG tablet Take 1 tablet (0.25 mg total) by mouth at bedtime.   Ascorbic Acid (VITAMIN C) 1000 MG tablet Take 1,000 mg by mouth daily.   Calcium Carb-Cholecalciferol [phone] MG-UNIT TABS Take by mouth.   cetirizine (ZYRTEC) 10 MG tablet Take 10 mg by mouth 2 (two) times daily.   cholecalciferol (VITAMIN D) 1000 units tablet Take 1,000 Units by mouth daily.   Fluticasone-Salmeterol (ADVAIR) 250-50 MCG/DOSE AEPB Inhale 1 puff into the lungs 2 (two) times daily.   latanoprost (XALATAN) 0.005 % ophthalmic solution 1 drop at bedtime.   levothyroxine (EUTHYROX) 75 MCG tablet Take 1 tablet (75 mcg total) by mouth daily before breakfast.   losartan (COZAAR) 100 MG tablet Take 1 tablet by mouth once daily   Magnesium 100 MG CAPS Take by mouth.   MELATONIN PO Take by mouth.   metoprolol tartrate (LOPRESSOR) 25 MG tablet Take 1 tablet (25 mg total) by mouth in the morning, at noon, and at bedtime. Three times a day   montelukast (SINGULAIR) 10 MG tablet Take 10 mg by mouth daily.   Multiple Vitamins-Minerals (OCUVITE EYE HEATLH GUMMIES PO) Take by mouth daily.   Omega-3 Fatty Acids (FISH OIL PO) Take by mouth.   Rivaroxaban (XARELTO) 15 MG TABS tablet Take 15 mg by mouth daily with supper.    sertraline (ZOLOFT) 25 MG tablet Take 1 tablet (25 mg total) by mouth at bedtime.   spironolactone (ALDACTONE) 25 MG tablet Take 1 tablet by mouth twice daily   clotrimazole-betamethasone (LOTRISONE) cream Apply 1 application topically 2 (two) times daily. To rash  under breast (Patient not taking: Reported on 05/13/2021)   [DISCONTINUED] fexofenadine (ALLEGRA) 180 MG tablet Take 180 mg by mouth daily.   [DISCONTINUED] MAGNESIUM-ZINC PO Take 500 mg by mouth at bedtime as needed.   No facility-administered encounter medications on file as of 05/13/2021.    Allergies (verified) Tramadol and Meloxicam   History: Past Medical History:  Diagnosis Date   Allergies    Arthritis    Asthma    Atrial fibrillation  ([place])    Hypertension    Peripheral arterial disease ([place])    Sleep apnea    CPAP   Past Surgical History:  Procedure Laterality Date   ABDOMINAL HYSTERECTOMY  1991   uterus only- still has cervix - done for prolapse   CATARACT EXTRACTION W/PHACO Left 11/24/2020   Procedure: CATARACT EXTRACTION PHACO AND INTRAOCULAR LENS PLACEMENT (IOC) LEFT 6.99 00:46.1;  Surgeon: Eulogio Bear, MD;  Location: [facility];  Service: Ophthalmology;  Laterality: Left;   CATARACT EXTRACTION W/PHACO Right 12/08/2020   Procedure: CATARACT EXTRACTION PHACO AND INTRAOCULAR LENS PLACEMENT (IOC) RIGHT 5.13 00:34.2;  Surgeon: Eulogio Bear, MD;  Location: [facility];  Service: Ophthalmology;  Laterality: Right;   COLONOSCOPY  2009   normal   TOE SURGERY     Family History  Problem Relation Age of Onset   Breast cancer Other 89   Dementia Mother    Cancer Father        prostate and bone   Heart attack Sister    Social History   Socioeconomic History   Marital status: Widowed    Spouse name: Not on file   Number of children: 2   Years of education: Not on file   Highest education level: 12th grade  Occupational History   Occupation: retired  Tobacco Use   Smoking status: Former    Packs/day: 0.50    Years: 29.00    Pack years: 14.50    Types: Cigarettes    Quit date: 09/28/1989    Years since quitting: 31.6   Smokeless tobacco: Never   Tobacco comments:    smoking cessation materials not required  Vaping Use   Vaping Use: Never used  Substance and Sexual Activity   Alcohol use: No   Drug use: No   Sexual activity: Not Currently  Other Topics Concern   Not on file  Social History Narrative   Pt lives alone   Social Determinants of Health   Financial Resource Strain: Low Risk    Difficulty of Paying Living Expenses: Not hard at all  Food Insecurity: No Food Insecurity   Worried About Charity fundraiser in the Last Year: Never true   [place] in the  Last Year: Never true  Transportation Needs: No Transportation Needs   Lack of Transportation (Medical): No   Lack of Transportation (Non-Medical): No  Physical Activity: Inactive   Days of Exercise per Week: 0 days   Minutes of Exercise per Session: 0 min  Stress: No Stress Concern Present   Feeling of Stress : Not at all  Social Connections: Moderately Isolated   Frequency of Communication with Friends and Family: More than three times a week   Frequency of Social Gatherings with Friends and Family: More than three times a week   Attends Religious Services: More than 4 times per year   Active Member of [employer] or Organizations: No   Attends Archivist Meetings: Never   Marital Status: Widowed  Tobacco Counseling Counseling given: Not Answered Tobacco comments: smoking cessation materials not required   Clinical Intake:  Pre-visit preparation completed: Yes  Pain : 0-10 Pain Score: 4  Pain Type: Acute pain Pain Location: Head (headache) Pain Descriptors / Indicators: Aching Pain Onset: Today Pain Frequency: Constant     Nutritional Risks: None Diabetes: No  How often do you need to have someone help you when you read instructions, pamphlets, or other written materials from your doctor or pharmacy?: 1 - Never    Interpreter Needed?: No  Information entered by :: Ariana Marker LPN   Activities of Daily Living In your present state of health, do you have any difficulty performing the following activities: 05/13/2021 04/16/2021  Hearing? Tempie Donning  Vision? N N  Difficulty concentrating or making decisions? N N  Walking or climbing stairs? Y Y  Dressing or bathing? N N  Doing errands, shopping? N N  Preparing Food and eating ? N -  Using the Toilet? N -  In the past six months, have you accidently leaked urine? N -  Do you have problems with loss of bowel control? N -  Managing your Medications? N -  Managing your Finances? N -  Housekeeping or managing your  Housekeeping? N -  Some recent data might be hidden    Patient Care Team: Glean Hess, MD as PCP - General (Internal Medicine) Idamae Schuller, Laban Emperor, MD as Consulting Physician (Pulmonary Disease) Edwyna Ready, MD as Consulting Physician (Cardiology) Dr Jerrilyn Cairo as Consulting Physician (Sleep Medicine) Eulogio Bear, MD as Consulting Physician (Ophthalmology) Lucky Cowboy Erskine Squibb, MD as Referring Physician (Vascular Surgery)  Indicate any recent Medical Services you may have received from other than [facility] providers in the past year (date may be approximate).     Assessment:   This is a routine wellness examination for [place].  Hearing/Vision screen Hearing Screening - Comments:: Pt c/o mild hearing difficulty but declines hearing aids Vision Screening - Comments:: Annual vision screenings done at [facility] Dr. Edison Pace  Dietary issues and exercise activities discussed: Current Exercise Habits: The patient does not participate in regular exercise at present, Exercise limited by: orthopedic condition(s)   Goals Addressed   None    Depression Screen PHQ 2/9 Scores 05/13/2021 04/16/2021 12/25/2020 09/17/2020 08/14/2020 08/06/2020 05/12/2020  PHQ - 2 Score 0 0 0 0 0 1 0  PHQ- 9 Score - 0 0 0 0 1 -    Fall Risk Fall Risk  05/13/2021 04/16/2021 12/25/2020 09/17/2020 08/06/2020  Falls in the past year? 0 0 0 0 0  Number falls in past yr: 0 0 - - -  Injury with Fall? 0 0 - - -  Risk for fall due to : No Fall Risks No Fall Risks - - -  Risk for fall due to: Comment - - - - -  Follow up Falls prevention discussed Falls evaluation completed Falls evaluation completed Falls evaluation completed Falls evaluation completed    [place]:  Any stairs in or around the home? No  If so, are there any without handrails? No  Home free of loose throw rugs in walkways, pet beds, electrical cords, etc? Yes  Adequate lighting in your home to reduce risk of  falls? Yes   ASSISTIVE DEVICES UTILIZED TO PREVENT FALLS:  Life alert? No  Use of a cane, walker or w/c? No  Grab bars in the bathroom? No  Shower chair or bench in  shower? No  Elevated toilet seat or a handicapped toilet? Yes   TIMED UP AND GO:  Was the test performed? No . Telephonic visit.   Cognitive Function: Normal cognitive status assessed by direct observation by this Nurse Health Advisor. No abnormalities found.       6CIT Screen 05/09/2019 04/24/2018  What Year? 0 points 0 points  What month? 0 points 0 points  What time? 0 points 3 points  Count back from 20 0 points 0 points  Months in reverse 0 points 0 points  Repeat phrase 0 points 0 points  Total Score 0 3    Immunizations Immunization History  Administered Date(s) Administered   Fluad Quad(high Dose 65+) 05/28/2019, 05/08/2020   Influenza, High Dose Seasonal PF 08/11/2017, 04/24/2018   PFIZER(Purple Top)SARS-COV-2 Vaccination 10/08/2019, 10/29/2019, 07/17/2020   Pneumococcal Conjugate-13 04/24/2018   Pneumococcal Polysaccharide-23 05/28/2019   Tdap 02/26/2015    TDAP status: Up to date  Flu Vaccine status: Due, Education has been provided regarding the importance of this vaccine. Advised may receive this vaccine at local pharmacy or Health Dept. Aware to provide a copy of the vaccination record if obtained from local pharmacy or Health Dept. Verbalized acceptance and understanding.  Pneumococcal vaccine status: Up to date  Covid-19 vaccine status: Completed vaccines  Qualifies for Shingles Vaccine? Yes   Zostavax completed No   Shingrix Completed?: No.    Education has been provided regarding the importance of this vaccine. Patient has been advised to call insurance company to determine out of pocket expense if they have not yet received this vaccine. Advised may also receive vaccine at local pharmacy or Health Dept. Verbalized acceptance and understanding.  Screening Tests Health Maintenance  Topic  Date Due   Zoster Vaccines- Shingrix (1 of 2) Never done   MAMMOGRAM  08/03/2019   COVID-19 Vaccine (4 - Booster for Pfizer series) 10/09/2020   INFLUENZA VACCINE  03/16/2021   DEXA SCAN  06/05/2021 (Originally 08/08/2004)   TETANUS/TDAP  02/25/2025   HPV VACCINES  Aged Out    Health Maintenance  Health Maintenance Due  Topic Date Due   Zoster Vaccines- Shingrix (1 of 2) Never done   MAMMOGRAM  08/03/2019   COVID-19 Vaccine (4 - Booster for Pfizer series) 10/09/2020   INFLUENZA VACCINE  03/16/2021    Colorectal cancer screening: No longer required.   Mammogram status: Completed 08/02/18. Repeat every year. Ordered today  Bone density status: no longer required  Lung Cancer Screening: (Low Dose CT Chest recommended if Age 41-80 years, 30 pack-year currently smoking OR have quit w/in 15years.) does not qualify.   Additional Screening:  Hepatitis C Screening: does not qualify  Vision Screening: Recommended annual ophthalmology exams for early detection of glaucoma and other disorders of the eye. Is the patient up to date with their annual eye exam?  Yes  Who is the provider or what is the name of the office in which the patient attends annual eye exams? Dr. Edison Pace.   Dental Screening: Recommended annual dental exams for proper oral hygiene  Community Resource Referral / Chronic Care Management: CRR required this visit?  No   CCM required this visit?  No      Plan:     I have personally reviewed and noted the following in the patient's chart:   Medical and social history Use of alcohol, tobacco or illicit drugs  Current medications and supplements including opioid prescriptions.  Functional ability and status Nutritional status Physical activity Advanced directives  List of other physicians Hospitalizations, surgeries, and ER visits in previous 12 months Vitals Screenings to include cognitive, depression, and falls Referrals and appointments  In addition, I  have reviewed and discussed with patient certain preventive protocols, quality metrics, and best practice recommendations. A written personalized care plan for preventive services as well as general preventive health recommendations were provided to patient.     Ariana Marker, LPN   10/14/3141   Nurse Notes: pt c/o constipation; taking stool softeners and laxatives daily. Pt has bowel movements daily but states they are slightly loose and small in size; plans to discuss at CPE next month.

## 2021-05-21 ENCOUNTER — Other Ambulatory Visit: Payer: Self-pay | Admitting: Internal Medicine

## 2021-05-21 DIAGNOSIS — F5101 Primary insomnia: Secondary | ICD-10-CM

## 2021-05-21 NOTE — Telephone Encounter (Signed)
Requested medication (s) are due for refill today - yes  Requested medication (s) are on the active medication list -yes  Future visit scheduled -yes  Last refill: 12/25/20 #30 3RF  Notes to clinic: Request RF: non delegated Rx  Requested Prescriptions  Pending Prescriptions Disp Refills   ALPRAZolam (XANAX) 0.25 MG tablet [Pharmacy Med Name: ALPRAZolam 0.25 MG Oral Tablet] 30 tablet 0    Sig: TAKE 1 TABLET BY MOUTH AT BEDTIME     Not Delegated - Psychiatry:  Anxiolytics/Hypnotics Failed - 05/21/2021  9:05 AM      Failed - This refill cannot be delegated      Failed - Urine Drug Screen completed in last 360 days      Passed - Valid encounter within last 6 months    Recent Outpatient Visits           1 month ago Tinea corporis   [facility] Glean Hess, MD   4 months ago Essential hypertension   [place], Laura H, MD   8 months ago Essential hypertension   [place], Laura H, MD   9 months ago Essential hypertension   [facility] Glean Hess, MD   9 months ago Essential hypertension   [facility] Glean Hess, MD       Future Appointments             In 2 weeks Army Melia Jesse Sans, MD [facility], [facility]               Requested Prescriptions  Pending Prescriptions Disp Refills   ALPRAZolam (XANAX) 0.25 MG tablet [Pharmacy Med Name: ALPRAZolam 0.25 MG Oral Tablet] 30 tablet 0    Sig: TAKE 1 TABLET BY MOUTH AT BEDTIME     Not Delegated - Psychiatry:  Anxiolytics/Hypnotics Failed - 05/21/2021  9:05 AM      Failed - This refill cannot be delegated      Failed - Urine Drug Screen completed in last 360 days      Passed - Valid encounter within last 6 months    Recent Outpatient Visits           1 month ago Tinea corporis   [facility] Glean Hess, MD   4 months ago Essential hypertension   [place], Laura H, MD   8 months ago  Essential hypertension   [facility] Glean Hess, MD   9 months ago Essential hypertension   [facility] Glean Hess, MD   9 months ago Essential hypertension   [facility] Glean Hess, MD       Future Appointments             In 2 weeks Army Melia Jesse Sans, MD [facility], [facility]

## 2021-05-21 NOTE — Telephone Encounter (Signed)
Please review. Last office visit 04/16/2021.  KP

## 2021-05-25 ENCOUNTER — Telehealth: Payer: Self-pay | Admitting: Internal Medicine

## 2021-05-25 NOTE — Telephone Encounter (Signed)
Please call pt to reschedule CPE for a Thursday.  KP

## 2021-05-25 NOTE — Telephone Encounter (Signed)
Pt is calling because she is interested in moving her CPE to a Thursday. Pts son and daughter in law are off on Thursdays to bring the pt to the appt. Please advise with the patient for availability CB- [phone]

## 2021-05-28 DIAGNOSIS — R2681 Unsteadiness on feet: Secondary | ICD-10-CM | POA: Diagnosis not present

## 2021-05-28 DIAGNOSIS — R293 Abnormal posture: Secondary | ICD-10-CM | POA: Diagnosis not present

## 2021-05-28 DIAGNOSIS — R262 Difficulty in walking, not elsewhere classified: Secondary | ICD-10-CM | POA: Diagnosis not present

## 2021-05-28 DIAGNOSIS — M25551 Pain in right hip: Secondary | ICD-10-CM | POA: Diagnosis not present

## 2021-06-01 ENCOUNTER — Ambulatory Visit: Payer: Self-pay

## 2021-06-01 NOTE — Telephone Encounter (Signed)
Pt called stating that she believes that she may have a cold. She states that she has started to cough up yellow phlegm. Please advise.   Pt. Reports she started having productive cough Saturday. Has yellow mucus. Has been taking Mucinex. Has asthma and concerned about "infection." Has not done a COVID 19 test. Virtual visit for tomorrow.   Reason for Disposition  SEVERE coughing spells (e.g., whooping sound after coughing, vomiting after coughing)  Answer Assessment - Initial Assessment Questions 1. ONSET: "When did the cough begin?"      Saturday 2. SEVERITY: "How bad is the cough today?"      Moderate 3. SPUTUM: "Describe the color of your sputum" (none, dry cough; clear, white, yellow, green)     Yellow 4. HEMOPTYSIS: "Are you coughing up any blood?" If so ask: "How much?" (flecks, streaks, tablespoons, etc.)     No 5. DIFFICULTY BREATHING: "Are you having difficulty breathing?" If Yes, ask: "How bad is it?" (e.g., mild, moderate, severe)    - MILD: No SOB at rest, mild SOB with walking, speaks normally in sentences, can lie down, no retractions, pulse < 100.    - MODERATE: SOB at rest, SOB with minimal exertion and prefers to sit, cannot lie down flat, speaks in phrases, mild retractions, audible wheezing, pulse 100-120.    - SEVERE: Very SOB at rest, speaks in single words, struggling to breathe, sitting hunched forward, retractions, pulse > 120      No 6. FEVER: "Do you have a fever?" If Yes, ask: "What is your temperature, how was it measured, and when did it start?"     No 7. CARDIAC HISTORY: "Do you have any history of heart disease?" (e.g., heart attack, congestive heart failure)      No 8. LUNG HISTORY: "Do you have any history of lung disease?"  (e.g., pulmonary embolus, asthma, emphysema)     Asthma 9. PE RISK FACTORS: "Do you have a history of blood clots?" (or: recent major surgery, recent prolonged travel, bedridden)     No 10. OTHER SYMPTOMS: "Do you have any other  symptoms?" (e.g., runny nose, wheezing, chest pain)       Tired, runny nose 11. PREGNANCY: "Is there any chance you are pregnant?" "When was your last menstrual period?"       No 12. TRAVEL: "Have you traveled out of the country in the last month?" (e.g., travel history, exposures)       No  Protocols used: Cough - Acute Productive-A-AH

## 2021-06-02 ENCOUNTER — Encounter: Payer: Self-pay | Admitting: Internal Medicine

## 2021-06-02 ENCOUNTER — Other Ambulatory Visit: Payer: Self-pay

## 2021-06-02 ENCOUNTER — Ambulatory Visit (INDEPENDENT_AMBULATORY_CARE_PROVIDER_SITE_OTHER): Payer: Medicare Other | Admitting: Internal Medicine

## 2021-06-02 VITALS — BP 130/78 | HR 78 | Temp 98.2°F | Ht 67.0 in | Wt 204.0 lb

## 2021-06-02 DIAGNOSIS — J45901 Unspecified asthma with (acute) exacerbation: Secondary | ICD-10-CM

## 2021-06-02 DIAGNOSIS — I83813 Varicose veins of bilateral lower extremities with pain: Secondary | ICD-10-CM | POA: Diagnosis not present

## 2021-06-02 DIAGNOSIS — R194 Change in bowel habit: Secondary | ICD-10-CM | POA: Diagnosis not present

## 2021-06-02 DIAGNOSIS — J441 Chronic obstructive pulmonary disease with (acute) exacerbation: Secondary | ICD-10-CM

## 2021-06-02 MED ORDER — PROMETHAZINE-DM 6.25-15 MG/5ML PO SYRP
5.0000 mL | ORAL_SOLUTION | Freq: Four times a day (QID) | ORAL | 0 refills | Status: DC | PRN
Start: 2021-06-02 — End: 2021-06-17

## 2021-06-02 MED ORDER — AMOXICILLIN-POT CLAVULANATE 875-125 MG PO TABS: 1.0000 | ORAL_TABLET | Freq: Two times a day (BID) | ORAL | 0 refills | Status: AC

## 2021-06-02 MED ORDER — PREDNISONE 10 MG PO TABS: 10.0000 mg | ORAL_TABLET | ORAL | 0 refills | Status: AC

## 2021-06-02 NOTE — Progress Notes (Signed)
Date:  06/02/2021   Name:  Ariana Guzman   DOB:  March 04, 1941   MRN:  [id number]   Chief Complaint: Cough  Cough This is a recurrent problem. The current episode started in the past 7 days. The problem has been gradually worsening. The problem occurs constantly. The cough is Productive of sputum. Associated symptoms include nasal congestion, rhinorrhea, a sore throat and wheezing. Pertinent negatives include no chest pain, chills, fever, headaches or shortness of breath. The symptoms are aggravated by lying down.  Constipation This is a chronic problem. The problem is unchanged. Her stool frequency is 1 time per day. The stool is described as formed. The patient is not on a high fiber diet. She Does not exercise regularly. There has Been adequate water intake. Pertinent negatives include no abdominal pain, diarrhea, difficulty urinating, fecal incontinence, fever, hematochezia or rectal pain. Risk factors include obesity. She has tried stool softeners for the symptoms. The treatment provided moderate relief.  Leg Pain  There was no injury mechanism. The pain is present in the right leg and left leg. The quality of the pain is described as aching and cramping. The pain is moderate. The pain has been Constant since onset. The symptoms are aggravated by palpation and weight bearing. Treatments tried: compression stockings and vein ablation.   Lab Results  Component Value Date   CREATININE 0.76 12/25/2020   BUN 16 12/25/2020   NA 134 12/25/2020   K 4.9 12/25/2020   CL 97 12/25/2020   CO2 23 12/25/2020   Lab Results  Component Value Date   CHOL 174 05/28/2019   HDL 59 05/28/2019   LDLCALC 99 05/28/2019   TRIG 84 05/28/2019   CHOLHDL 2.9 05/28/2019   Lab Results  Component Value Date   TSH 2.636 06/05/2020   Lab Results  Component Value Date   HGBA1C 6.3 (H) 12/25/2020   Lab Results  Component Value Date   WBC 8.0 06/05/2020   HGB 12.7 06/05/2020   HCT 38.6 06/05/2020   MCV  90.8 06/05/2020   PLT 239 06/05/2020   Lab Results  Component Value Date   ALT 17 06/05/2020   AST 18 06/05/2020   ALKPHOS 54 06/05/2020   BILITOT 1.2 06/05/2020     Review of Systems  Constitutional:  Positive for fatigue. Negative for chills and fever.  HENT:  Positive for congestion, rhinorrhea and sore throat. Negative for trouble swallowing.   Respiratory:  Positive for cough, chest tightness and wheezing. Negative for shortness of breath.   Cardiovascular:  Positive for leg swelling. Negative for chest pain and palpitations.  Gastrointestinal:  Positive for constipation. Negative for abdominal pain, anal bleeding, diarrhea, hematochezia and rectal pain.  Genitourinary:  Negative for difficulty urinating.  Neurological:  Negative for headaches.   Patient Active Problem List   Diagnosis Date Noted   Slow transit constipation 12/25/2020   Bursitis of hip 06/05/2020   Acquired thrombophilia ([place]) 06/05/2020   Hyponatremia 04/05/2020   Environmental and seasonal allergies 04/03/2020   Pre-diabetes 05/29/2018   Primary insomnia 09/15/2017   Generalized anxiety disorder 09/15/2017   Essential hypertension 09/09/2017   Varicose veins of both lower extremities with pain 09/09/2017   Bilateral lower extremity edema 09/09/2017   Current use of long term anticoagulation 07/28/2017   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/03/2015   Otitis externa, chronic 12/03/2015   Obstructive sleep apnea 10/07/2015   Asthma, persistent controlled 09/25/2015   Acquired hypothyroidism 02/29/2012   Chronic  atrial fibrillation ([place]) 02/29/2012   Hyperlipidemia 02/29/2012    Allergies  Allergen Reactions   Tramadol Shortness Of Breath   Meloxicam Rash    Past Surgical History:  Procedure Laterality Date   ABDOMINAL HYSTERECTOMY  1991   uterus only- still has cervix - done for prolapse   CATARACT EXTRACTION W/PHACO Left 11/24/2020   Procedure: CATARACT EXTRACTION PHACO AND  INTRAOCULAR LENS PLACEMENT (IOC) LEFT 6.99 00:46.1;  Surgeon: Eulogio Bear, MD;  Location: [facility];  Service: Ophthalmology;  Laterality: Left;   CATARACT EXTRACTION W/PHACO Right 12/08/2020   Procedure: CATARACT EXTRACTION PHACO AND INTRAOCULAR LENS PLACEMENT (IOC) RIGHT 5.13 00:34.2;  Surgeon: Eulogio Bear, MD;  Location: [facility];  Service: Ophthalmology;  Laterality: Right;   COLONOSCOPY  2009   normal   TOE SURGERY      Social History   Tobacco Use   Smoking status: Former    Packs/day: 0.50    Years: 29.00    Pack years: 14.50    Types: Cigarettes    Quit date: 09/28/1989    Years since quitting: 31.6   Smokeless tobacco: Never   Tobacco comments:    smoking cessation materials not required  Vaping Use   Vaping Use: Never used  Substance Use Topics   Alcohol use: No   Drug use: No     Medication list has been reviewed and updated.  Current Meds  Medication Sig   albuterol (PROVENTIL) (2.5 MG/3ML) 0.083% nebulizer solution Take 2.5 mg by nebulization every 6 (six) hours as needed.   ALPRAZolam (XANAX) 0.25 MG tablet TAKE 1 TABLET BY MOUTH AT BEDTIME   Ascorbic Acid (VITAMIN C) 1000 MG tablet Take 1,000 mg by mouth daily.   Calcium Carb-Cholecalciferol [phone] MG-UNIT TABS Take by mouth.   cetirizine (ZYRTEC) 10 MG tablet Take 10 mg by mouth 2 (two) times daily.   cholecalciferol (VITAMIN D) 1000 units tablet Take 1,000 Units by mouth daily.   Fluticasone-Salmeterol (ADVAIR) 250-50 MCG/DOSE AEPB Inhale 1 puff into the lungs 2 (two) times daily.   latanoprost (XALATAN) 0.005 % ophthalmic solution 1 drop at bedtime.   levothyroxine (EUTHYROX) 75 MCG tablet Take 1 tablet (75 mcg total) by mouth daily before breakfast.   losartan (COZAAR) 100 MG tablet Take 1 tablet by mouth once daily   Magnesium 100 MG CAPS Take by mouth.   MELATONIN PO Take by mouth.   metoprolol tartrate (LOPRESSOR) 25 MG tablet Take 1 tablet (25 mg total) by mouth in  the morning, at noon, and at bedtime. Three times a day   montelukast (SINGULAIR) 10 MG tablet Take 10 mg by mouth daily.   Multiple Vitamins-Minerals (OCUVITE EYE HEATLH GUMMIES PO) Take by mouth daily.   Omega-3 Fatty Acids (FISH OIL PO) Take by mouth.   Rivaroxaban (XARELTO) 15 MG TABS tablet Take 15 mg by mouth daily with supper.    sertraline (ZOLOFT) 25 MG tablet Take 1 tablet (25 mg total) by mouth at bedtime.   spironolactone (ALDACTONE) 25 MG tablet Take 1 tablet by mouth twice daily    PHQ 2/9 Scores 06/02/2021 05/13/2021 04/16/2021 12/25/2020  PHQ - 2 Score 0 0 0 0  PHQ- 9 Score 0 - 0 0    GAD 7 : Generalized Anxiety Score 06/02/2021 04/16/2021 12/25/2020 09/17/2020  Nervous, Anxious, on Edge 0 0 0 3  Control/stop worrying 0 0 1 1  Worry too much - different things 0 0 1 1  Trouble relaxing 0 0  0 0  Restless 0 0 0 0  Easily annoyed or irritable 0 0 0 0  Afraid - awful might happen 0 0 0 0  Total GAD 7 Score 0 0 2 5  Anxiety Difficulty Not difficult at all - - -    BP Readings from Last 3 Encounters:  06/02/21 (!) 150/86  04/16/21 130/86  12/25/20 136/80    Physical Exam Constitutional:      Appearance: Normal appearance.  Cardiovascular:     Rate and Rhythm: Normal rate. Rhythm irregular.     Pulses: Normal pulses.  Pulmonary:     Effort: Pulmonary effort is normal.     Breath sounds: No stridor. Wheezing present. No rhonchi.  Abdominal:     Palpations: Abdomen is soft.     Tenderness: There is no abdominal tenderness.  Musculoskeletal:        General: Swelling (bilateral varicose veins - large and tender) present.     Cervical back: Normal range of motion.  Lymphadenopathy:     Cervical: No cervical adenopathy.  Neurological:     General: No focal deficit present.     Mental Status: She is alert.    Wt Readings from Last 3 Encounters:  06/02/21 204 lb (92.5 kg)  04/16/21 204 lb (92.5 kg)  12/25/20 197 lb (89.4 kg)    BP (!) 150/86   Pulse 78   Temp  98.2 F (36.8 C) (Oral)   Ht 5\' 7"  (1.702 m)   Wt 204 lb (92.5 kg)   SpO2 93%   BMI 31.95 kg/m   Assessment and Plan: 1. Acute exacerbation of COPD with asthma ([place]) Continue Advair and albuterol as needed Will treat with steroid taper, augmentin - amoxicillin-clavulanate (AUGMENTIN) 875-125 MG tablet; Take 1 tablet by mouth 2 (two) times daily for 10 days.  Dispense: 20 tablet; Refill: 0 - predniSONE (DELTASONE) 10 MG tablet; Take 1 tablet (10 mg total) by mouth as directed for 6 days. Take 6,5,4,3,2,1 then stop  Dispense: 21 tablet; Refill: 0 - promethazine-dextromethorphan (PROMETHAZINE-DM) 6.25-15 MG/5ML syrup; Take 5 mLs by mouth 4 (four) times daily as needed for cough.  Dispense: 118 mL; Refill: 0  2. Change in bowel habits Continue Stool softeners daily No worrisome features. Pt had normal colonoscopy in 2009 - normal  3. Varicose veins of both lower extremities with pain Continue support hose and elevation May benefit from a rolator for community ambulation   Partially dictated using Editor, commissioning. Any errors are unintentional.  Halina Maidens, MD [facility]  06/02/2021

## 2021-06-04 ENCOUNTER — Ambulatory Visit: Payer: Medicare Other | Admitting: Internal Medicine

## 2021-06-05 ENCOUNTER — Ambulatory Visit: Payer: Self-pay | Admitting: *Deleted

## 2021-06-05 NOTE — Telephone Encounter (Signed)
Attempted to call patient- second attempt.  Left message to call office

## 2021-06-05 NOTE — Telephone Encounter (Signed)
Patients cough may continue to linger for several weeks. But since she is feeling better, she needs to give the medication more time to work in her system.

## 2021-06-05 NOTE — Telephone Encounter (Signed)
Pt called in stating PCP just recently gave her some medicine for her cough but she states now she has a dry cough, and wants to know should she continue to take it, or is there something else she can take, please advise.   Attempted to call patient- left message to call office

## 2021-06-05 NOTE — Telephone Encounter (Signed)
Pt called and stated that she has a dry hacky cough that is driving her crazy. She started coughing and having congestion symptoms on 05/29/21, had an OV on 06/02/21 and started her medications that were prescribed: Augmentin, prednisone, and promethazine syrup. Says her cough has gotten better from not coughing up mucus but now its just dry and hacky and coughing very often. Pt is asking if she should continue taking the promethazine cough syrup or not. Advised to continue taking all prescribed meds and once she gets close to finishing if she isn't any better to call the office back and schedule an appt to be seen again. Care advice given and pt verbalized understanding.   Reason for Disposition  Cough  Answer Assessment - Initial Assessment Questions 1. ONSET: "When did the cough begin?"      05/29/21 2. SEVERITY: "How bad is the cough today?"      Better but dry and hacky 3. SPUTUM: "Describe the color of your sputum" (none, dry cough; clear, white, yellow, green)     Dry hacky, clear 4. HEMOPTYSIS: "Are you coughing up any blood?" If so ask: "How much?" (flecks, streaks, tablespoons, etc.)     no 5. DIFFICULTY BREATHING: "Are you having difficulty breathing?" If Yes, ask: "How bad is it?" (e.g., mild, moderate, severe)    - MILD: No SOB at rest, mild SOB with walking, speaks normally in sentences, can lie down, no retractions, pulse < 100.    - MODERATE: SOB at rest, SOB with minimal exertion and prefers to sit, cannot lie down flat, speaks in phrases, mild retractions, audible wheezing, pulse 100-120.    - SEVERE: Very SOB at rest, speaks in single words, struggling to breathe, sitting hunched forward, retractions, pulse > 120      No 6. FEVER: "Do you have a fever?" If Yes, ask: "What is your temperature, how was it measured, and when did it start?"     no 7. CARDIAC HISTORY: "Do you have any history of heart disease?" (e.g., heart attack, congestive heart failure)      afib 8. LUNG  HISTORY: "Do you have any history of lung disease?"  (e.g., pulmonary embolus, asthma, emphysema)     Sleep apnea, asthma  9. PE RISK FACTORS: "Do you have a history of blood clots?" (or: recent major surgery, recent prolonged travel, bedridden)     No 10. OTHER SYMPTOMS: "Do you have any other symptoms?" (e.g., runny nose, wheezing, chest pain)       allergies 11. PREGNANCY: "Is there any chance you are pregnant?" "When was your last menstrual period?"       no 12. TRAVEL: "Have you traveled out of the country in the last month?" (e.g., travel history, exposures)       no  Protocols used: Cough - Acute Non-Productive-A-AH

## 2021-06-08 ENCOUNTER — Telehealth: Payer: Self-pay

## 2021-06-08 NOTE — Telephone Encounter (Signed)
Copied from [place] [id number]. Topic: General - Other >> Jun 08, 2021  8:41 AM Loma [place] wrote: Pt was seen last week, still has coughing, still some SOB, congestion, maybe a little better but  Daughter, RN checked lungs yesterday right lung pretty  congested, left spotty, mucus  still yellow daughter told her to call Dr B,  should be better out of cough med, finished prednisone, called office , will send CRM to Dr B, FU [phone]

## 2021-06-09 ENCOUNTER — Encounter: Payer: Medicare Other | Admitting: Internal Medicine

## 2021-06-16 DIAGNOSIS — M25551 Pain in right hip: Secondary | ICD-10-CM | POA: Diagnosis not present

## 2021-06-16 DIAGNOSIS — R293 Abnormal posture: Secondary | ICD-10-CM | POA: Diagnosis not present

## 2021-06-16 DIAGNOSIS — R262 Difficulty in walking, not elsewhere classified: Secondary | ICD-10-CM | POA: Diagnosis not present

## 2021-06-16 DIAGNOSIS — R2681 Unsteadiness on feet: Secondary | ICD-10-CM | POA: Diagnosis not present

## 2021-06-17 ENCOUNTER — Ambulatory Visit
Admission: RE | Admit: 2021-06-17 | Discharge: 2021-06-17 | Disposition: A | Discharge status: Home / Self Care | Payer: Medicare Other | Attending: Internal Medicine | Admitting: Internal Medicine

## 2021-06-17 ENCOUNTER — Other Ambulatory Visit: Payer: Self-pay

## 2021-06-17 ENCOUNTER — Ambulatory Visit
Admission: RE | Admit: 2021-06-17 | Discharge: 2021-06-17 | Disposition: A | Discharge status: Home / Self Care | Payer: Medicare Other | Source: Ambulatory Visit | Attending: Internal Medicine | Admitting: Internal Medicine

## 2021-06-17 ENCOUNTER — Ambulatory Visit: Payer: Self-pay | Admitting: *Deleted

## 2021-06-17 ENCOUNTER — Ambulatory Visit (INDEPENDENT_AMBULATORY_CARE_PROVIDER_SITE_OTHER): Payer: Medicare Other | Admitting: Internal Medicine

## 2021-06-17 ENCOUNTER — Encounter: Payer: Self-pay | Admitting: Internal Medicine

## 2021-06-17 VITALS — BP 146/94 | HR 76 | Temp 98.1°F | Ht 67.0 in | Wt 206.0 lb

## 2021-06-17 DIAGNOSIS — J45901 Unspecified asthma with (acute) exacerbation: Secondary | ICD-10-CM | POA: Diagnosis not present

## 2021-06-17 DIAGNOSIS — R21 Rash and other nonspecific skin eruption: Secondary | ICD-10-CM | POA: Diagnosis not present

## 2021-06-17 DIAGNOSIS — J441 Chronic obstructive pulmonary disease with (acute) exacerbation: Secondary | ICD-10-CM

## 2021-06-17 DIAGNOSIS — R059 Cough, unspecified: Secondary | ICD-10-CM | POA: Diagnosis not present

## 2021-06-17 DIAGNOSIS — R062 Wheezing: Secondary | ICD-10-CM | POA: Diagnosis not present

## 2021-06-17 MED ORDER — AZITHROMYCIN 250 MG PO TABS: ORAL_TABLET | ORAL | 0 refills | Status: AC

## 2021-06-17 MED ORDER — PROMETHAZINE-DM 6.25-15 MG/5ML PO SYRP
5.0000 mL | ORAL_SOLUTION | Freq: Four times a day (QID) | ORAL | 0 refills | Status: DC | PRN
Start: 2021-06-17 — End: 2021-09-09

## 2021-06-17 NOTE — Telephone Encounter (Signed)
Patient states she was recently treated for asthma exacerbation/bronchitis. Patient was given cough medication, prednisone and antibiotic- which did help improve her symptoms- but she is still having some episodes of SOB, wheezing with exhalation-R, some cough. Patient has been using her nebulizer 2 times a day which does help.Appointment scheduled for follow up.

## 2021-06-17 NOTE — Telephone Encounter (Signed)
Reason for Disposition  [1] Longstanding difficulty breathing (e.g., CHF, COPD, emphysema) AND [2] WORSE than normal  Answer Assessment - Initial Assessment Questions 1. RESPIRATORY STATUS: "Describe your breathing?" (e.g., wheezing, shortness of breath, unable to speak, severe coughing)      SOB yesterday- patient had therapy and caused coughing, wheezing R with breathing out, O2 sat- 96% 2. ONSET: "When did this breathing problem begin?"      06/02/21 3. PATTERN "Does the difficult breathing come and go, or has it been constant since it started?"      Comes and goes 4. SEVERITY: "How bad is your breathing?" (e.g., mild, moderate, severe)    - MILD: No SOB at rest, mild SOB with walking, speaks normally in sentences, can lie down, no retractions, pulse < 100.    - MODERATE: SOB at rest, SOB with minimal exertion and prefers to sit, cannot lie down flat, speaks in phrases, mild retractions, audible wheezing, pulse 100-120.    - SEVERE: Very SOB at rest, speaks in single words, struggling to breathe, sitting hunched forward, retractions, pulse > 120      mild 5. RECURRENT SYMPTOM: "Have you had difficulty breathing before?" If Yes, ask: "When was the last time?" and "What happened that time?"      Afib, asthma 6. CARDIAC HISTORY: "Do you have any history of heart disease?" (e.g., heart attack, angina, bypass surgery, angioplasty)      Aib 7. LUNG HISTORY: "Do you have any history of lung disease?"  (e.g., pulmonary embolus, asthma, emphysema)     Asthma 8. CAUSE: "What do you think is causing the breathing problem?"      Recent infection 9. OTHER SYMPTOMS: "Do you have any other symptoms? (e.g., dizziness, runny nose, cough, chest pain, fever)     Patient states her symptoms are better- but not completely gone 10. O2 SATURATION MONITOR:  "Do you use an oxygen saturation monitor (pulse oximeter) at home?" If Yes, "What is your reading (oxygen level) today?" "What is your usual oxygen  saturation reading?" (e.g., 95%)       Yes-96% 11. PREGNANCY: "Is there any chance you are pregnant?" "When was your last menstrual period?"       na 12. TRAVEL: "Have you traveled out of the country in the last month?" (e.g., travel history, exposures)       no  Protocols used: Breathing Difficulty-A-AH

## 2021-06-17 NOTE — Progress Notes (Signed)
Date:  06/17/2021   Name:  Ariana Guzman   DOB:  [birth year]   MRN:  [id number]   Chief Complaint: Cough  Cough This is a recurrent problem. The current episode started 1 to 4 weeks ago. The cough is Productive of sputum. Associated symptoms include shortness of breath and wheezing. Pertinent negatives include no chest pain, chills, fever, headaches, nasal congestion or sore throat.  Treated for acute bronchitis in asthma 2 weeks ago with Augmentin and steroid taper.  She is improving - sputum is light now but last night started to have more wheezing.  She used her nebulizer several times and finally got relief. She also has a transient red itchy rash on her arms and hands.  It resolves with topical cortisone cream.  Lab Results  Component Value Date   CREATININE 0.76 12/25/2020   BUN 16 12/25/2020   NA 134 12/25/2020   K 4.9 12/25/2020   CL 97 12/25/2020   CO2 23 12/25/2020   Lab Results  Component Value Date   CHOL 174 05/28/2019   HDL 59 05/28/2019   LDLCALC 99 05/28/2019   TRIG 84 05/28/2019   CHOLHDL 2.9 05/28/2019   Lab Results  Component Value Date   TSH 2.636 06/05/2020   Lab Results  Component Value Date   HGBA1C 6.3 (H) 12/25/2020   Lab Results  Component Value Date   WBC 8.0 06/05/2020   HGB 12.7 06/05/2020   HCT 38.6 06/05/2020   MCV 90.8 06/05/2020   PLT 239 06/05/2020   Lab Results  Component Value Date   ALT 17 06/05/2020   AST 18 06/05/2020   ALKPHOS 54 06/05/2020   BILITOT 1.2 06/05/2020     Review of Systems  Constitutional:  Negative for chills, fatigue, fever and unexpected weight change.  HENT:  Negative for sore throat and trouble swallowing.   Respiratory:  Positive for cough, shortness of breath and wheezing.   Cardiovascular:  Negative for chest pain and palpitations.  Neurological:  Negative for dizziness, light-headedness and headaches.   Patient Active Problem List   Diagnosis Date Noted   Slow transit constipation  12/25/2020   Bursitis of hip 06/05/2020   Acquired thrombophilia ([place]) 06/05/2020   Hyponatremia 04/05/2020   Environmental and seasonal allergies 04/03/2020   Pre-diabetes 05/29/2018   Primary insomnia 09/15/2017   Generalized anxiety disorder 09/15/2017   Essential hypertension 09/09/2017   Varicose veins of both lower extremities with pain 09/09/2017   Bilateral lower extremity edema 09/09/2017   Current use of long term anticoagulation 07/28/2017   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/03/2015   Otitis externa, chronic 12/03/2015   Obstructive sleep apnea 10/07/2015   Asthma, persistent controlled 09/25/2015   Acquired hypothyroidism 02/29/2012   Chronic atrial fibrillation ([place]) 02/29/2012   Hyperlipidemia 02/29/2012    Allergies  Allergen Reactions   Tramadol Shortness Of Breath   Meloxicam Rash    Past Surgical History:  Procedure Laterality Date   ABDOMINAL HYSTERECTOMY  1991   uterus only- still has cervix - done for prolapse   CATARACT EXTRACTION W/PHACO Left 11/24/2020   Procedure: CATARACT EXTRACTION PHACO AND INTRAOCULAR LENS PLACEMENT (IOC) LEFT 6.99 00:46.1;  Surgeon: Eulogio Bear, MD;  Location: [facility];  Service: Ophthalmology;  Laterality: Left;   CATARACT EXTRACTION W/PHACO Right 12/08/2020   Procedure: CATARACT EXTRACTION PHACO AND INTRAOCULAR LENS PLACEMENT (IOC) RIGHT 5.13 00:34.2;  Surgeon: Eulogio Bear, MD;  Location: [facility];  Service: Ophthalmology;  Laterality: Right;   COLONOSCOPY  2009   normal   TOE SURGERY      Social History   Tobacco Use   Smoking status: Former    Packs/day: 0.50    Years: 29.00    Pack years: 14.50    Types: Cigarettes    Quit date: 09/28/1989    Years since quitting: 31.7   Smokeless tobacco: Never   Tobacco comments:    smoking cessation materials not required  Vaping Use   Vaping Use: Never used  Substance Use Topics   Alcohol use: No   Drug use: No      Medication list has been reviewed and updated.  Current Meds  Medication Sig   albuterol (PROVENTIL) (2.5 MG/3ML) 0.083% nebulizer solution Take 2.5 mg by nebulization every 6 (six) hours as needed.   ALPRAZolam (XANAX) 0.25 MG tablet TAKE 1 TABLET BY MOUTH AT BEDTIME   Ascorbic Acid (VITAMIN C) 1000 MG tablet Take 1,000 mg by mouth daily.   Calcium Carb-Cholecalciferol [phone] MG-UNIT TABS Take by mouth.   cetirizine (ZYRTEC) 10 MG tablet Take 10 mg by mouth 2 (two) times daily.   cholecalciferol (VITAMIN D) 1000 units tablet Take 1,000 Units by mouth daily.   clotrimazole-betamethasone (LOTRISONE) cream Apply 1 application topically 2 (two) times daily. To rash under breast   Fluticasone-Salmeterol (ADVAIR) 250-50 MCG/DOSE AEPB Inhale 1 puff into the lungs 2 (two) times daily.   latanoprost (XALATAN) 0.005 % ophthalmic solution 1 drop at bedtime.   levothyroxine (EUTHYROX) 75 MCG tablet Take 1 tablet (75 mcg total) by mouth daily before breakfast.   losartan (COZAAR) 100 MG tablet Take 1 tablet by mouth once daily   Magnesium 100 MG CAPS Take by mouth.   MELATONIN PO Take by mouth.   metoprolol tartrate (LOPRESSOR) 25 MG tablet Take 1 tablet (25 mg total) by mouth in the morning, at noon, and at bedtime. Three times a day   montelukast (SINGULAIR) 10 MG tablet Take 10 mg by mouth daily.   Multiple Vitamins-Minerals (OCUVITE EYE HEATLH GUMMIES PO) Take by mouth daily.   Omega-3 Fatty Acids (FISH OIL PO) Take by mouth.   Rivaroxaban (XARELTO) 15 MG TABS tablet Take 15 mg by mouth daily with supper.    sertraline (ZOLOFT) 25 MG tablet Take 1 tablet (25 mg total) by mouth at bedtime.   spironolactone (ALDACTONE) 25 MG tablet Take 1 tablet by mouth twice daily    PHQ 2/9 Scores 06/17/2021 06/02/2021 05/13/2021 04/16/2021  PHQ - 2 Score 0 0 0 0  PHQ- 9 Score 0 0 - 0    GAD 7 : Generalized Anxiety Score 06/17/2021 06/02/2021 04/16/2021 12/25/2020  Nervous, Anxious, on Edge 0 0 0 0   Control/stop worrying 0 0 0 1  Worry too much - different things 0 0 0 1  Trouble relaxing 0 0 0 0  Restless 0 0 0 0  Easily annoyed or irritable 0 0 0 0  Afraid - awful might happen 0 0 0 0  Total GAD 7 Score 0 0 0 2  Anxiety Difficulty Not difficult at all Not difficult at all - -    BP Readings from Last 3 Encounters:  06/17/21 (!) 146/94  06/02/21 130/78  04/16/21 130/86    Physical Exam Vitals and nursing note reviewed.  Constitutional:      General: She is not in acute distress.    Appearance: Normal appearance. She is well-developed.  HENT:     Head: Normocephalic  and atraumatic.  Cardiovascular:     Rate and Rhythm: Normal rate and regular rhythm.     Pulses: Normal pulses.  Pulmonary:     Effort: Pulmonary effort is normal. No respiratory distress.     Breath sounds: Wheezing (both bases) present. No rhonchi.  Musculoskeletal:     Cervical back: Normal range of motion.     Right lower leg: No edema.     Left lower leg: No edema.  Lymphadenopathy:     Cervical: No cervical adenopathy.  Skin:    General: Skin is warm and dry.     Findings: Erythema (left ring finger and left wrist) present. No rash.  Neurological:     Mental Status: She is alert and oriented to person, place, and time.  Psychiatric:        Mood and Affect: Mood normal.        Behavior: Behavior normal.    Wt Readings from Last 3 Encounters:  06/17/21 206 lb (93.4 kg)  06/02/21 204 lb (92.5 kg)  04/16/21 204 lb (92.5 kg)    BP (!) 146/94   Pulse 76   Temp 98.1 F (36.7 C) (Oral)   Wt 206 lb (93.4 kg)   SpO2 94%   BMI 32.26 kg/m   Assessment and Plan: 1. Acute exacerbation of COPD with asthma ([place]) Much improved but with persistent cough and wheezing. Continue Advair, use albuterol nebs tid; will get CXR and prescribe Zpak. - DG Chest 2 View - azithromycin (ZITHROMAX Z-PAK) 250 MG tablet; UAD  Dispense: 6 each; Refill: 0 - promethazine-dextromethorphan (PROMETHAZINE-DM) 6.25-15  MG/5ML syrup; Take 5 mLs by mouth 4 (four) times daily as needed for cough.  Dispense: 180 mL; Refill: 0  2. Rash and nonspecific skin eruption Continue topical cortisone PRN   Partially dictated using Editor, commissioning. Any errors are unintentional.  Halina Maidens, MD [facility]  06/17/2021

## 2021-06-20 ENCOUNTER — Other Ambulatory Visit: Payer: Self-pay | Admitting: Internal Medicine

## 2021-06-20 DIAGNOSIS — F5101 Primary insomnia: Secondary | ICD-10-CM

## 2021-06-20 NOTE — Telephone Encounter (Signed)
Requested medication (s) are due for refill today: yes  Requested medication (s) are on the active medication list: yes  Last refill:  05/31/21  Future visit scheduled: yes  Notes to clinic:  med not delegated to NT to RF   Requested Prescriptions  Pending Prescriptions Disp Refills   ALPRAZolam (XANAX) 0.25 MG tablet [Pharmacy Med Name: ALPRAZolam 0.25 MG Oral Tablet] 30 tablet 0    Sig: TAKE 1 TABLET BY MOUTH AT BEDTIME     Not Delegated - Psychiatry:  Anxiolytics/Hypnotics Failed - 06/20/2021 11:25 AM      Failed - This refill cannot be delegated      Failed - Urine Drug Screen completed in last 360 days      Passed - Valid encounter within last 6 months    Recent Outpatient Visits           3 days ago Acute exacerbation of COPD with asthma [facility])   [facility] Glean Hess, MD   2 weeks ago Acute exacerbation of COPD with asthma [facility])   [facility] Glean Hess, MD   2 months ago Tinea corporis   [facility] Glean Hess, MD   5 months ago Essential hypertension   [place], Laura H, MD   9 months ago Essential hypertension   [facility] Glean Hess, MD       Future Appointments             In 2 months Army Melia Jesse Sans, MD [facility], [facility]

## 2021-06-23 DIAGNOSIS — R262 Difficulty in walking, not elsewhere classified: Secondary | ICD-10-CM | POA: Diagnosis not present

## 2021-06-23 DIAGNOSIS — R2681 Unsteadiness on feet: Secondary | ICD-10-CM | POA: Diagnosis not present

## 2021-06-23 DIAGNOSIS — R293 Abnormal posture: Secondary | ICD-10-CM | POA: Diagnosis not present

## 2021-06-23 DIAGNOSIS — M25551 Pain in right hip: Secondary | ICD-10-CM | POA: Diagnosis not present

## 2021-06-29 ENCOUNTER — Other Ambulatory Visit: Payer: Self-pay | Admitting: Internal Medicine

## 2021-06-29 DIAGNOSIS — I10 Essential (primary) hypertension: Secondary | ICD-10-CM

## 2021-06-29 DIAGNOSIS — E039 Hypothyroidism, unspecified: Secondary | ICD-10-CM

## 2021-06-29 NOTE — Telephone Encounter (Signed)
Requested Prescriptions  Pending Prescriptions Disp Refills  . levothyroxine (SYNTHROID) 75 MCG tablet [Pharmacy Med Name: Levothyroxine Sodium 75 MCG Oral Tablet] 60 tablet 0    Sig: TAKE 1 TABLET BY MOUTH ONCE DAILY BEFORE BREAKFAST     Endocrinology:  Hypothyroid Agents Failed - 06/29/2021  2:22 PM      Failed - TSH needs to be rechecked within 3 months after an abnormal result. Refill until TSH is due.      Failed - TSH in normal range and within 360 days    TSH  Date Value Ref Range Status  06/05/2020 2.636 0.350 - 4.500 uIU/mL Final    Comment:    Performed by a 3rd Generation assay with a functional sensitivity of <=0.01 uIU/mL. Performed at [facility], [address] [place], [place] [address]   05/28/2019 1.540 0.450 - 4.500 uIU/mL Final         Passed - Valid encounter within last 12 months    Recent Outpatient Visits          1 week ago Acute exacerbation of COPD with asthma [facility])   [facility] Glean Hess, MD   3 weeks ago Acute exacerbation of COPD with asthma [facility])   [facility] Glean [facility], [facility]   2 months ago Tinea corporis   [facility] Glean Hess, MD   6 months ago Essential hypertension   [facility] Glean Hess, MD   9 months ago Essential hypertension   [facility] Glean Hess, MD      Future Appointments            In 2 months Glean Hess, MD [facility], [facility]           . losartan (COZAAR) 100 MG tablet [Pharmacy Med Name: Losartan Potassium 100 MG Oral Tablet] 90 tablet 0    Sig: Take 1 tablet by mouth once daily     Cardiovascular:  Angiotensin Receptor Blockers Failed - 06/29/2021  2:22 PM      Failed - Cr in normal range and within 180 days    Creatinine, Ser  Date Value Ref Range Status  12/25/2020 0.76 0.57 - 1.00 mg/dL Final         Failed - K in normal range and within 180 days    Potassium  Date Value Ref Range Status   12/25/2020 4.9 3.5 - 5.2 mmol/L Final         Failed - Last BP in normal range    BP Readings from Last 1 Encounters:  06/17/21 (!) 146/94         Passed - Patient is not pregnant      Passed - Valid encounter within last 6 months    Recent Outpatient Visits          1 week ago Acute exacerbation of COPD with asthma [facility])   [facility] Glean [facility], MD   3 weeks ago Acute exacerbation of COPD with asthma [facility])   [facility] Glean Hess, MD   2 months ago Tinea corporis   [facility] Glean Hess, MD   6 months ago Essential hypertension   [place], Laura H, MD   9 months ago Essential hypertension   [facility] Glean Hess, MD      Future Appointments  In 2 months Army Melia, Jesse Sans, MD [facility], [facility]

## 2021-06-29 NOTE — Telephone Encounter (Signed)
Requested medications are due for refill today.  yes  Requested medications are on the active medications list.  yes  Last refill. 12/25/2020  Future visit scheduled.   yes  Notes to clinic.  Refill failed protocol d/t expired labs.

## 2021-06-30 ENCOUNTER — Telehealth: Payer: Self-pay | Admitting: Internal Medicine

## 2021-06-30 NOTE — Telephone Encounter (Signed)
Ariana Guzman, [place]  [place] [address]  Phone: [phone] Fax: [phone]  Altha Harm from Pharmacy called to report that they need the PCP's approval for a manufacturer change for the pt's levothyroxine. Please advise

## 2021-07-14 DIAGNOSIS — D2371 Other benign neoplasm of skin of right lower limb, including hip: Secondary | ICD-10-CM | POA: Diagnosis not present

## 2021-07-14 DIAGNOSIS — I739 Peripheral vascular disease, unspecified: Secondary | ICD-10-CM | POA: Diagnosis not present

## 2021-07-14 DIAGNOSIS — M2011 Hallux valgus (acquired), right foot: Secondary | ICD-10-CM | POA: Diagnosis not present

## 2021-07-14 DIAGNOSIS — M21621 Bunionette of right foot: Secondary | ICD-10-CM | POA: Diagnosis not present

## 2021-07-14 DIAGNOSIS — L851 Acquired keratosis [keratoderma] palmaris et plantaris: Secondary | ICD-10-CM | POA: Diagnosis not present

## 2021-07-14 DIAGNOSIS — M7741 Metatarsalgia, right foot: Secondary | ICD-10-CM | POA: Diagnosis not present

## 2021-07-14 DIAGNOSIS — M76821 Posterior tibial tendinitis, right leg: Secondary | ICD-10-CM | POA: Diagnosis not present

## 2021-07-14 DIAGNOSIS — I83893 Varicose veins of bilateral lower extremities with other complications: Secondary | ICD-10-CM | POA: Diagnosis not present

## 2021-07-14 DIAGNOSIS — L84 Corns and callosities: Secondary | ICD-10-CM | POA: Diagnosis not present

## 2021-07-14 DIAGNOSIS — L909 Atrophic disorder of skin, unspecified: Secondary | ICD-10-CM | POA: Diagnosis not present

## 2021-07-14 DIAGNOSIS — B351 Tinea unguium: Secondary | ICD-10-CM | POA: Diagnosis not present

## 2021-07-14 DIAGNOSIS — M2041 Other hammer toe(s) (acquired), right foot: Secondary | ICD-10-CM | POA: Diagnosis not present

## 2021-08-17 ENCOUNTER — Other Ambulatory Visit: Payer: Self-pay | Admitting: Internal Medicine

## 2021-08-17 DIAGNOSIS — F411 Generalized anxiety disorder: Secondary | ICD-10-CM

## 2021-08-19 NOTE — Telephone Encounter (Signed)
Requested Prescriptions  Pending Prescriptions Disp Refills   sertraline (ZOLOFT) 25 MG tablet [Pharmacy Med Name: Sertraline HCl 25 MG Oral Tablet] 90 tablet 0    Sig: TAKE 1 TABLET BY MOUTH EVERY DAY AT BEDTIME     Psychiatry:  Antidepressants - SSRI Passed - 08/17/2021  6:37 PM      Passed - Valid encounter within last 6 months    Recent Outpatient Visits          2 months ago Acute exacerbation of COPD with asthma [facility])   [facility] Glean Hess, MD   2 months ago Acute exacerbation of COPD with asthma [facility])   [facility] Glean [facility], MD   4 months ago Tinea corporis   [facility] Glean Hess, MD   7 months ago Essential hypertension   [place], MD   11 months ago Essential hypertension   [facility] Glean Hess, MD      Future Appointments            In 3 weeks Army Melia Jesse Sans, MD [facility], [facility]

## 2021-09-03 DIAGNOSIS — J45998 Other asthma: Secondary | ICD-10-CM | POA: Diagnosis not present

## 2021-09-08 ENCOUNTER — Telehealth: Payer: Medicare Other | Admitting: Internal Medicine

## 2021-09-09 ENCOUNTER — Ambulatory Visit (INDEPENDENT_AMBULATORY_CARE_PROVIDER_SITE_OTHER): Payer: Medicare Other | Admitting: Internal Medicine

## 2021-09-09 ENCOUNTER — Encounter: Payer: Self-pay | Admitting: Internal Medicine

## 2021-09-09 ENCOUNTER — Other Ambulatory Visit: Payer: Self-pay

## 2021-09-09 VITALS — BP 130/78 | HR 83 | Temp 98.5°F | Ht 67.0 in | Wt 207.0 lb

## 2021-09-09 DIAGNOSIS — U071 COVID-19: Secondary | ICD-10-CM

## 2021-09-09 LAB — POC COVID19 BINAXNOW: SARS Coronavirus 2 Ag: POSITIVE — AB

## 2021-09-09 MED ORDER — MOLNUPIRAVIR EUA 200MG CAPSULE: 4.0000 | ORAL_CAPSULE | Freq: Two times a day (BID) | ORAL | 0 refills | Status: AC

## 2021-09-09 NOTE — Patient Instructions (Signed)
Take Tylenol 650 - 1000 mg every 6-8 hours for fever, body aches and headache. Drink plenty of fluids with electrolytes. Monitor for fever that does not decrease and/or shortness of breath that worsens or is present at rest. Quarantine 5 days.

## 2021-09-09 NOTE — Progress Notes (Signed)
Date:  09/09/2021   Name:  Ariana Guzman   DOB:  Dec 13, 1940   MRN:  [id number]   Chief Complaint: Cough  Cough This is a new problem. The current episode started in the past 7 days. The problem occurs every few minutes. The cough is Productive of sputum. Associated symptoms include chills, headaches, nasal congestion, postnasal drip and rhinorrhea. Pertinent negatives include no chest pain, fever, shortness of breath or wheezing.  Onset 3 days ago with head ache, cough and congestion.  Daughter in law had similar sx but was not very sick and went to work.  She has not been tested for Covid.  Lab Results  Component Value Date   NA 134 12/25/2020   K 4.9 12/25/2020   CO2 23 12/25/2020   GLUCOSE 107 (H) 12/25/2020   BUN 16 12/25/2020   CREATININE 0.76 12/25/2020   CALCIUM 9.1 12/25/2020   EGFR 79 12/25/2020   GFRNONAA 63 08/14/2020   Lab Results  Component Value Date   CHOL 174 05/28/2019   HDL 59 05/28/2019   LDLCALC 99 05/28/2019   TRIG 84 05/28/2019   CHOLHDL 2.9 05/28/2019   Lab Results  Component Value Date   TSH 2.636 06/05/2020   Lab Results  Component Value Date   HGBA1C 6.3 (H) 12/25/2020   Lab Results  Component Value Date   WBC 8.0 06/05/2020   HGB 12.7 06/05/2020   HCT 38.6 06/05/2020   MCV 90.8 06/05/2020   PLT 239 06/05/2020   Lab Results  Component Value Date   ALT 17 06/05/2020   AST 18 06/05/2020   ALKPHOS 54 06/05/2020   BILITOT 1.2 06/05/2020   No results found for: 25OHVITD2, 25OHVITD3, VD25OH   Review of Systems  Constitutional:  Positive for chills and fatigue. Negative for fever.  HENT:  Positive for postnasal drip and rhinorrhea.   Respiratory:  Positive for cough. Negative for chest tightness, shortness of breath and wheezing.   Cardiovascular:  Negative for chest pain.  Gastrointestinal:  Negative for abdominal pain, constipation and diarrhea.  Neurological:  Positive for headaches. Negative for dizziness and light-headedness.    Patient Active Problem List   Diagnosis Date Noted   Slow transit constipation 12/25/2020   Bursitis of hip 06/05/2020   Acquired thrombophilia ([place]) 06/05/2020   Hyponatremia 04/05/2020   Environmental and seasonal allergies 04/03/2020   Pre-diabetes 05/29/2018   Primary insomnia 09/15/2017   Generalized anxiety disorder 09/15/2017   Essential hypertension 09/09/2017   Varicose veins of both lower extremities with pain 09/09/2017   Bilateral lower extremity edema 09/09/2017   Current use of long term anticoagulation 07/28/2017   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/03/2015   Otitis externa, chronic 12/03/2015   Obstructive sleep apnea 10/07/2015   Asthma, persistent controlled 09/25/2015   Acquired hypothyroidism 02/29/2012   Chronic atrial fibrillation ([place]) 02/29/2012   Hyperlipidemia 02/29/2012    Allergies  Allergen Reactions   Tramadol Shortness Of Breath   Meloxicam Rash    Past Surgical History:  Procedure Laterality Date   ABDOMINAL HYSTERECTOMY  1991   uterus only- still has cervix - done for prolapse   CATARACT EXTRACTION W/PHACO Left 11/24/2020   Procedure: CATARACT EXTRACTION PHACO AND INTRAOCULAR LENS PLACEMENT (IOC) LEFT 6.99 00:46.1;  Surgeon: Eulogio Bear, MD;  Location: [facility];  Service: Ophthalmology;  Laterality: Left;   CATARACT EXTRACTION W/PHACO Right 12/08/2020   Procedure: CATARACT EXTRACTION PHACO AND INTRAOCULAR LENS PLACEMENT (IOC) RIGHT 5.13 00:34.2;  Surgeon: Eulogio Bear, MD;  Location: [facility];  Service: Ophthalmology;  Laterality: Right;   COLONOSCOPY  2009   normal   TOE SURGERY      Social History   Tobacco Use   Smoking status: Former    Packs/day: 0.50    Years: 29.00    Pack years: 14.50    Types: Cigarettes    Quit date: 09/28/1989    Years since quitting: 31.9   Smokeless tobacco: Never   Tobacco comments:    smoking cessation materials not required  Vaping Use    Vaping Use: Never used  Substance Use Topics   Alcohol use: No   Drug use: No     Medication list has been reviewed and updated.  Current Meds  Medication Sig   albuterol (PROVENTIL) (2.5 MG/3ML) 0.083% nebulizer solution Take 2.5 mg by nebulization every 6 (six) hours as needed.   ALPRAZolam (XANAX) 0.25 MG tablet TAKE 1 TABLET BY MOUTH AT BEDTIME   Ascorbic Acid (VITAMIN C) 1000 MG tablet Take 1,000 mg by mouth daily.   Calcium Carb-Cholecalciferol [phone] MG-UNIT TABS Take by mouth.   cetirizine (ZYRTEC) 10 MG tablet Take 10 mg by mouth 2 (two) times daily.   cholecalciferol (VITAMIN D) 1000 units tablet Take 1,000 Units by mouth daily.   clotrimazole-betamethasone (LOTRISONE) cream Apply 1 application topically 2 (two) times daily. To rash under breast   Fluticasone-Salmeterol (ADVAIR) 250-50 MCG/DOSE AEPB Inhale 1 puff into the lungs 2 (two) times daily.   latanoprost (XALATAN) 0.005 % ophthalmic solution 1 drop at bedtime.   levothyroxine (SYNTHROID) 75 MCG tablet TAKE 1 TABLET BY MOUTH ONCE DAILY BEFORE BREAKFAST   losartan (COZAAR) 100 MG tablet Take 1 tablet by mouth once daily   Magnesium 100 MG CAPS Take by mouth.   MELATONIN PO Take by mouth.   metoprolol tartrate (LOPRESSOR) 25 MG tablet Take 1 tablet (25 mg total) by mouth in the morning, at noon, and at bedtime. Three times a day   montelukast (SINGULAIR) 10 MG tablet Take 10 mg by mouth daily.   Multiple Vitamins-Minerals (OCUVITE EYE HEATLH GUMMIES PO) Take by mouth daily.   Omega-3 Fatty Acids (FISH OIL PO) Take by mouth.   Rivaroxaban (XARELTO) 15 MG TABS tablet Take 15 mg by mouth daily with supper.    sertraline (ZOLOFT) 25 MG tablet TAKE 1 TABLET BY MOUTH EVERY DAY AT BEDTIME   spironolactone (ALDACTONE) 25 MG tablet Take 1 tablet by mouth twice daily    PHQ 2/9 Scores 09/09/2021 06/17/2021 06/02/2021 05/13/2021  PHQ - 2 Score 0 0 0 0  PHQ- 9 Score 3 0 0 -    GAD 7 : Generalized Anxiety Score 09/09/2021  06/17/2021 06/02/2021 04/16/2021  Nervous, Anxious, on Edge 0 0 0 0  Control/stop worrying 1 0 0 0  Worry too much - different things 0 0 0 0  Trouble relaxing 0 0 0 0  Restless 0 0 0 0  Easily annoyed or irritable 0 0 0 0  Afraid - awful might happen 0 0 0 0  Total GAD 7 Score 1 0 0 0  Anxiety Difficulty Not difficult at all Not difficult at all Not difficult at all -    BP Readings from Last 3 Encounters:  09/09/21 130/78  06/17/21 (!) 146/94  06/02/21 130/78    Physical Exam Constitutional:      Appearance: Normal appearance.     Comments: Appears fatigued  HENT:  Head: Normocephalic.     Right Ear: Tympanic membrane and ear canal normal.     Left Ear: Tympanic membrane and ear canal normal.     Nose:     Right Sinus: No maxillary sinus tenderness or frontal sinus tenderness.     Left Sinus: No maxillary sinus tenderness or frontal sinus tenderness.  Cardiovascular:     Rate and Rhythm: Normal rate and regular rhythm.  Pulmonary:     Effort: Pulmonary effort is normal.     Breath sounds: No wheezing or rhonchi.     Comments: Loose non productive cough Musculoskeletal:     Cervical back: Normal range of motion.  Lymphadenopathy:     Cervical: No cervical adenopathy.  Neurological:     Mental Status: She is alert.    Wt Readings from Last 3 Encounters:  09/09/21 207 lb (93.9 kg)  06/17/21 206 lb (93.4 kg)  06/02/21 204 lb (92.5 kg)    BP 130/78    Pulse 83    Temp 98.5 F (36.9 C) (Oral)    Ht 5' 7"  (1.702 m)    Wt 207 lb (93.9 kg)    SpO2 95%    BMI 32.42 kg/m   Assessment and Plan: 1. COVID-19 virus infection Take Tylenol 650 - 1000 mg every 6-8 hours for fever, body aches and headache. Drink plenty of fluids with electrolytes. Monitor for fever that does not decrease and/or shortness of breath that worsens or is present at rest. Continue Robitussin for cough. Quarantine for 5 days or longer if sx linger. - POC COVID-19 BinaxNow - molnupiravir EUA  (LAGEVRIO) 200 mg CAPS capsule; Take 4 capsules (800 mg total) by mouth 2 (two) times daily for 5 days.  Dispense: 40 capsule; Refill: 0   Partially dictated using Editor, commissioning. Any errors are unintentional.  Halina Maidens, MD [facility]  09/09/2021

## 2021-09-10 ENCOUNTER — Telehealth: Payer: Self-pay | Admitting: Internal Medicine

## 2021-09-10 ENCOUNTER — Encounter: Payer: Medicare Other | Admitting: Internal Medicine

## 2021-09-10 NOTE — Telephone Encounter (Signed)
Pt called requesting to have something stronger than robitussin, she also reports having a cold sore on her lip that she wants a Rx for. Says she was instructed to request something stronger if she felt she needed by her PCP. Please advise   [place], [place]  [place] [address]  Phone: [phone] Fax: [phone]

## 2021-09-10 NOTE — Telephone Encounter (Signed)
Please review. Called pt let her know that Dr. Army Melia may not see message until tomorrow. Pt verbalized understanding. Pt hasn't taken any medications for cold sore in many years. Pt would like to see what else she can take if a medication cant be sent in due to needing to be seen first.  KP

## 2021-09-11 ENCOUNTER — Other Ambulatory Visit: Payer: Self-pay | Admitting: Internal Medicine

## 2021-09-11 DIAGNOSIS — U071 COVID-19: Secondary | ICD-10-CM

## 2021-09-11 MED ORDER — PROMETHAZINE-DM 6.25-15 MG/5ML PO SYRP: 5.0000 mL | ORAL_SOLUTION | Freq: Four times a day (QID) | ORAL | 0 refills | Status: DC | PRN

## 2021-09-11 MED ORDER — VALACYCLOVIR HCL 1 G PO TABS: 1000.0000 mg | ORAL_TABLET | Freq: Two times a day (BID) | ORAL | 0 refills | Status: AC

## 2021-09-11 NOTE — Telephone Encounter (Signed)
Patient called on cell, left VM to return the call to the office to speak to a nurse about medications.

## 2021-09-11 NOTE — Telephone Encounter (Signed)
Called pt left VM to call back. Let pt know a medication for cough and cold sores was sent in to pharmacy.  [facility] nurse may give results to patient if they return call to clinic, a CRM has been created.  KP

## 2021-09-14 NOTE — Telephone Encounter (Signed)
Spoke to pt she stated she has picked up both medications and she feels better.  KP

## 2021-09-16 ENCOUNTER — Other Ambulatory Visit: Payer: Self-pay | Admitting: Internal Medicine

## 2021-09-16 DIAGNOSIS — I10 Essential (primary) hypertension: Secondary | ICD-10-CM

## 2021-09-16 DIAGNOSIS — F5101 Primary insomnia: Secondary | ICD-10-CM

## 2021-09-16 NOTE — Telephone Encounter (Signed)
Requested medication (s) are due for refill today: yes  Requested medication (s) are on the active medication list: yes  Last refill:  Xanax 06/22/21 #30/2, Spironolactone 03/24/21 #180/0   Future visit scheduled: yes  Notes to clinic:  not delegated for Xanax and spironolactone needs updated labs     Requested Prescriptions  Pending Prescriptions Disp Refills   ALPRAZolam (XANAX) 0.25 MG tablet [Pharmacy Med Name: ALPRAZolam 0.25 MG Oral Tablet] 30 tablet 0    Sig: TAKE 1 TABLET BY MOUTH AT BEDTIME     Not Delegated - Psychiatry: Anxiolytics/Hypnotics 2 Failed - 09/16/2021  9:14 AM      Failed - This refill cannot be delegated      Failed - Urine Drug Screen completed in last 360 days      Passed - Patient is not pregnant      Passed - Valid encounter within last 6 months    Recent Outpatient Visits           1 week ago COVID-19 virus infection   [facility] Glean Hess, MD   3 months ago Acute exacerbation of COPD with asthma [facility])   [facility] Glean Hess, MD   3 months ago Acute exacerbation of COPD with asthma [facility])   [facility] Glean Hess, MD   5 months ago Tinea corporis   [facility] Glean Hess, MD   8 months ago Essential hypertension   [facility] Glean Hess, MD       Future Appointments             In 1 week Glean Hess, MD [facility], [facility]             spironolactone (ALDACTONE) 25 MG tablet [Pharmacy Med Name: Spironolactone 25 MG Oral Tablet] 180 tablet 0    Sig: Take 1 tablet by mouth twice daily     Cardiovascular: Diuretics - Aldosterone Antagonist Failed - 09/16/2021  9:14 AM      Failed - Cr in normal range and within 180 days    Creatinine, Ser  Date Value Ref Range Status  12/25/2020 0.76 0.57 - 1.00 mg/dL Final          Failed - K in normal range and within 180 days    Potassium  Date Value Ref Range Status  12/25/2020 4.9 3.5 - 5.2  mmol/L Final          Failed - Na in normal range and within 180 days    Sodium  Date Value Ref Range Status  12/25/2020 134 134 - 144 mmol/L Final          Failed - eGFR is 30 or above and within 180 days    GFR calc Af Amer  Date Value Ref Range Status  08/14/2020 72 >59 mL/min/1.73 Final    Comment:    **In accordance with recommendations from the NKF-ASN Task force,**   [facility] is in the process of updating its eGFR calculation to the   2021 CKD-EPI creatinine equation that estimates kidney function   without a race variable.    GFR, Estimated  Date Value Ref Range Status  06/05/2020 >60 >60 mL/min Final    Comment:    (NOTE) Calculated using the CKD-EPI Creatinine Equation (2021)    GFR calc non Af Amer  Date Value Ref Range Status  08/14/2020 63 >59 mL/min/1.73 Final   eGFR  Date Value Ref  Range Status  12/25/2020 79 >59 mL/min/1.73 Final          Passed - Last BP in normal range    BP Readings from Last 1 Encounters:  09/09/21 130/78          Passed - Valid encounter within last 6 months    Recent Outpatient Visits           1 week ago COVID-19 virus infection   [facility] Glean Hess, MD   3 months ago Acute exacerbation of COPD with asthma [facility])   [facility] Glean Hess, MD   3 months ago Acute exacerbation of COPD with asthma [facility])   [facility] Glean [facility], MD   5 months ago Tinea corporis   [facility] Glean Hess, MD   8 months ago Essential hypertension   [facility] Glean Hess, MD       Future Appointments             In 1 week Army Melia Jesse Sans, MD [facility], [facility]

## 2021-09-16 NOTE — Telephone Encounter (Signed)
Please review. Last office visit 09/09/2021.  KP

## 2021-09-24 ENCOUNTER — Ambulatory Visit (INDEPENDENT_AMBULATORY_CARE_PROVIDER_SITE_OTHER): Payer: Medicare Other | Admitting: Internal Medicine

## 2021-09-24 ENCOUNTER — Other Ambulatory Visit: Payer: Self-pay

## 2021-09-24 ENCOUNTER — Encounter: Payer: Self-pay | Admitting: Internal Medicine

## 2021-09-24 VITALS — BP 120/70 | HR 86 | Ht 67.0 in | Wt 214.0 lb

## 2021-09-24 DIAGNOSIS — E039 Hypothyroidism, unspecified: Secondary | ICD-10-CM

## 2021-09-24 DIAGNOSIS — I482 Chronic atrial fibrillation, unspecified: Secondary | ICD-10-CM | POA: Diagnosis not present

## 2021-09-24 DIAGNOSIS — E871 Hypo-osmolality and hyponatremia: Secondary | ICD-10-CM

## 2021-09-24 DIAGNOSIS — D6869 Other thrombophilia: Secondary | ICD-10-CM | POA: Diagnosis not present

## 2021-09-24 DIAGNOSIS — R7303 Prediabetes: Secondary | ICD-10-CM | POA: Diagnosis not present

## 2021-09-24 DIAGNOSIS — J45998 Other asthma: Secondary | ICD-10-CM

## 2021-09-24 DIAGNOSIS — Z1231 Encounter for screening mammogram for malignant neoplasm of breast: Secondary | ICD-10-CM | POA: Diagnosis not present

## 2021-09-24 DIAGNOSIS — I10 Essential (primary) hypertension: Secondary | ICD-10-CM | POA: Diagnosis not present

## 2021-09-24 DIAGNOSIS — Z0000 Encounter for general adult medical examination without abnormal findings: Secondary | ICD-10-CM

## 2021-09-24 LAB — POCT URINALYSIS DIPSTICK
Bilirubin, UA: NEGATIVE
Blood, UA: NEGATIVE
Glucose, UA: NEGATIVE
Ketones, UA: NEGATIVE
Leukocytes, UA: NEGATIVE
Nitrite, UA: NEGATIVE
Protein, UA: NEGATIVE
Spec Grav, UA: 1.01 (ref 1.010–1.025)
Urobilinogen, UA: 0.2 E.U./dL
pH, UA: 6 (ref 5.0–8.0)

## 2021-09-24 NOTE — Progress Notes (Signed)
Date:  09/24/2021   Name:  Ariana Guzman   DOB:  Jul 09, 1941   MRN:  [id number]   Chief Complaint: Annual Exam Patient is here today for her annual exam. She feels well today. She is doing no exercise at this time. She is sleeping fairly well. Patient has no breast complaints at this time and is due for her mammogram.   Mammogram: 2019 DEXA - none Colonoscopy - aged out  Hypertension This is a chronic problem. The problem is controlled. Pertinent negatives include no chest pain, headaches, palpitations or shortness of breath. Past treatments include angiotensin blockers, beta blockers and diuretics. The current treatment provides significant improvement. There is no history of kidney disease, CAD/MI or CVA. Identifiable causes of hypertension include a thyroid problem.  Thyroid Problem Presents for follow-up visit. Symptoms include fatigue. Patient reports no anxiety, constipation, diarrhea, palpitations or tremors. The symptoms have been stable.  Asthma She complains of cough. There is no shortness of breath or wheezing. This is a recurrent problem. Episode onset: worse since covid 2 weeks ago. The cough is non-productive. Pertinent negatives include no chest pain, fever, headaches or trouble swallowing. She reports significant improvement on treatment. Her symptoms are not alleviated by steroid inhaler and beta-agonist. Her past medical history is significant for asthma.  Diabetes She presents for her follow-up diabetic visit. Diabetes type: prediabetes. Pertinent negatives for hypoglycemia include no dizziness, headaches, nervousness/anxiousness or tremors. Associated symptoms include fatigue. Pertinent negatives for diabetes include no chest pain, no polydipsia and no polyuria. Pertinent negatives for diabetic complications include no CVA.  Atrial fibrillation - on metoprolol and Xarelto with no side effects or bleeding.  Lab Results  Component Value Date   NA 134 12/25/2020   K 4.9  12/25/2020   CO2 23 12/25/2020   GLUCOSE 107 (H) 12/25/2020   BUN 16 12/25/2020   CREATININE 0.76 12/25/2020   CALCIUM 9.1 12/25/2020   EGFR 79 12/25/2020   GFRNONAA 63 08/14/2020   Lab Results  Component Value Date   CHOL 174 05/28/2019   HDL 59 05/28/2019   LDLCALC 99 05/28/2019   TRIG 84 05/28/2019   CHOLHDL 2.9 05/28/2019   Lab Results  Component Value Date   TSH 2.636 06/05/2020   Lab Results  Component Value Date   HGBA1C 6.3 (H) 12/25/2020   Lab Results  Component Value Date   WBC 8.0 06/05/2020   HGB 12.7 06/05/2020   HCT 38.6 06/05/2020   MCV 90.8 06/05/2020   PLT 239 06/05/2020   Lab Results  Component Value Date   ALT 17 06/05/2020   AST 18 06/05/2020   ALKPHOS 54 06/05/2020   BILITOT 1.2 06/05/2020   No results found for: 25OHVITD2, 25OHVITD3, VD25OH   Review of Systems  Constitutional:  Positive for fatigue. Negative for chills and fever.  HENT:  Negative for congestion, hearing loss, tinnitus, trouble swallowing and voice change.   Eyes:  Negative for visual disturbance.  Respiratory:  Positive for cough. Negative for chest tightness, shortness of breath and wheezing.   Cardiovascular:  Negative for chest pain, palpitations and leg swelling.  Gastrointestinal:  Negative for abdominal pain, constipation, diarrhea and vomiting.  Endocrine: Negative for polydipsia and polyuria.  Genitourinary:  Negative for dysuria, frequency, genital sores, vaginal bleeding and vaginal discharge.  Musculoskeletal:  Negative for arthralgias, gait problem and joint swelling.  Skin:  Negative for color change and rash.  Neurological:  Negative for dizziness, tremors, light-headedness and headaches.  Hematological:  Negative for adenopathy. Does not bruise/bleed easily.  Psychiatric/Behavioral:  Negative for dysphoric mood and sleep disturbance. The patient is not nervous/anxious.    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   Otitis externa, chronic 12/03/2015   Obstructive sleep apnea 10/07/2015   Asthma, persistent controlled 09/25/2015   Acquired hypothyroidism 02/29/2012   Chronic atrial fibrillation ([place]) 02/29/2012   Hyperlipidemia 02/29/2012    Allergies  Allergen Reactions   Tramadol Shortness Of Breath   Meloxicam Rash    Past Surgical History:  Procedure Laterality Date   ABDOMINAL HYSTERECTOMY  1991   uterus only- still has cervix - done for prolapse   CATARACT EXTRACTION W/PHACO Left 11/24/2020   Procedure: CATARACT EXTRACTION PHACO AND INTRAOCULAR LENS PLACEMENT (IOC) LEFT 6.99 00:46.1;  Surgeon: Eulogio Bear, MD;  Location: [facility];  Service: Ophthalmology;  Laterality: Left;   CATARACT EXTRACTION W/PHACO Right 12/08/2020   Procedure: CATARACT EXTRACTION PHACO AND INTRAOCULAR LENS PLACEMENT (IOC) RIGHT 5.13 00:34.2;  Surgeon: Eulogio Bear, MD;  Location: [facility];  Service: Ophthalmology;  Laterality: Right;   COLONOSCOPY  2009   normal   TOE SURGERY      Social History   Tobacco Use   Smoking status: Former    Packs/day: 0.50    Years: 29.00    Pack years: 14.50    Types: Cigarettes    Quit date: 09/28/1989    Years since quitting: 32.0   Smokeless tobacco: Never   Tobacco comments:    smoking cessation materials not required  Vaping Use   Vaping Use: Never used  Substance Use Topics   Alcohol use: No   Drug use: No      Medication list has been reviewed and updated.  Current Meds  Medication Sig   albuterol (PROVENTIL) (2.5 MG/3ML) 0.083% nebulizer solution Take 2.5 mg by nebulization every 6 (six) hours as needed.   ALPRAZolam (XANAX) 0.25 MG tablet TAKE 1 TABLET BY MOUTH AT BEDTIME   Ascorbic Acid (VITAMIN C) 1000 MG tablet Take 1,000 mg by mouth daily.   Calcium Carb-Cholecalciferol [phone] MG-UNIT TABS Take by mouth.   cetirizine (ZYRTEC) 10 MG tablet Take 10 mg by mouth 2 (two) times daily.   cholecalciferol (VITAMIN D) 1000 units tablet Take 1,000 Units by mouth daily.   clotrimazole-betamethasone (LOTRISONE) cream Apply 1 application topically 2 (two) times daily. To rash under breast   Fluticasone-Salmeterol (ADVAIR) 250-50 MCG/DOSE AEPB Inhale 1 puff into the lungs 2 (two) times daily.   latanoprost (XALATAN) 0.005 % ophthalmic solution 1 drop at bedtime.   levothyroxine (SYNTHROID) 75 MCG tablet TAKE 1 TABLET BY MOUTH ONCE DAILY BEFORE BREAKFAST   losartan (COZAAR) 100 MG tablet Take 1 tablet by mouth once daily   Magnesium 100 MG CAPS Take by mouth.   MELATONIN PO Take by mouth.   metoprolol tartrate (LOPRESSOR) 25 MG tablet Take 1 tablet (25 mg total) by mouth in the morning, at noon, and at bedtime. Three  times a day   montelukast (SINGULAIR) 10 MG tablet Take 10 mg by mouth daily.   Multiple Vitamins-Minerals (OCUVITE EYE HEATLH GUMMIES PO) Take by mouth daily.   Omega-3 Fatty Acids (FISH OIL PO) Take by mouth.   promethazine-dextromethorphan (PROMETHAZINE-DM) 6.25-15 MG/5ML syrup Take 5 mLs by mouth 4 (four) times daily as needed for cough.   Rivaroxaban (XARELTO) 15 MG TABS tablet Take 15 mg by mouth daily with supper.    sertraline (ZOLOFT) 25 MG tablet TAKE 1 TABLET BY MOUTH EVERY DAY AT BEDTIME   spironolactone (ALDACTONE) 25 MG tablet Take 1 tablet by mouth twice daily    PHQ 2/9 Scores 09/24/2021 09/09/2021 06/17/2021 06/02/2021  PHQ - 2 Score 0 0 0 0  PHQ- 9 Score 3 3 0 0     GAD 7 : Generalized Anxiety Score 09/24/2021 09/09/2021 06/17/2021 06/02/2021  Nervous, Anxious, on Edge 0 0 0 0  Control/stop worrying 1 1 0 0  Worry too much - different things 1 0 0 0  Trouble relaxing 0 0 0 0  Restless 0 0 0 0  Easily annoyed or irritable 0 0 0 0  Afraid - awful might happen 0 0 0 0  Total GAD 7 Score 2 1 0 0  Anxiety Difficulty Not difficult at all Not difficult at all Not difficult at all Not difficult at all    BP Readings from Last 3 Encounters:  09/24/21 120/70  09/09/21 130/78  06/17/21 (!) 146/94    Physical Exam Vitals and nursing note reviewed.  Constitutional:      General: She is not in acute distress.    Appearance: She is well-developed.  HENT:     Head: Normocephalic and atraumatic.     Right Ear: Tympanic membrane and ear canal normal.     Left Ear: Tympanic membrane and ear canal normal.     Nose:     Right Sinus: No maxillary sinus tenderness.     Left Sinus: No maxillary sinus tenderness.  Eyes:     General: No scleral icterus.       Right eye: No discharge.        Left eye: No discharge.     Conjunctiva/sclera: Conjunctivae normal.  Neck:     Thyroid: No thyromegaly.     Vascular: No carotid bruit.  Cardiovascular:     Rate and Rhythm: Normal rate and regular rhythm.     Pulses: Normal pulses.     Heart sounds: Normal heart sounds.  Pulmonary:     Effort: Pulmonary effort is normal. No respiratory distress.     Breath sounds: No wheezing or rhonchi.     Comments: Occasional loose cough Chest:  Breasts:    Right: No mass, nipple discharge, skin change or tenderness.     Left: No mass, nipple discharge, skin change or tenderness.  Abdominal:     General: Bowel sounds are normal.     Palpations: Abdomen is soft.     Tenderness: There is no abdominal tenderness.  Musculoskeletal:     Cervical back: Normal range of motion. No erythema.     Right lower leg: No edema.     Left lower leg: No edema.  Lymphadenopathy:      Cervical: No cervical adenopathy.  Skin:    General: Skin is warm and dry.     Capillary Refill: Capillary refill takes less than 2 seconds.     Findings: No rash.  Neurological:     General: No  focal deficit present.     Mental Status: She is alert and oriented to person, place, and time.     Cranial Nerves: No cranial nerve deficit.     Sensory: No sensory deficit.     Deep Tendon Reflexes: Reflexes are normal and symmetric.  Psychiatric:        Attention and Perception: Attention normal.        Mood and Affect: Mood normal.    Wt Readings from Last 3 Encounters:  09/24/21 214 lb (97.1 kg)  09/09/21 207 lb (93.9 kg)  06/17/21 206 lb (93.4 kg)    BP 120/70    Pulse 86    Ht 5' 7" (1.702 m)    Wt 214 lb (97.1 kg)    SpO2 97%    BMI 33.52 kg/m   Assessment and Plan: 1. Annual physical exam Normal exam for age. Recovering as expected from Covid with occasional cough. - Lipid panel  2. Encounter for screening mammogram for breast cancer Recommend mammogram at [facility]  3. Essential hypertension Clinically stable exam with well controlled BP. Tolerating medications without side effects at this time. Pt to continue current regimen and low sodium diet; benefits of regular exercise as able discussed. - CBC with Differential/Platelet - POCT urinalysis dipstick  4. Asthma, persistent controlled Symptoms are well controlled on Advair. She used nebulizer as needed.  5. Acquired hypothyroidism supplemented - TSH + free T4  6. Pre-diabetes Check labs and advise - Hemoglobin A1c  7. Hyponatremia Has been stable. - Comprehensive metabolic panel  8. Chronic atrial fibrillation (HCC) In SR today by exam Heart rate is controlled. Continue metoprolol and Xarelto.  9. Acquired thrombophilia ([place]) Stable without bleeding issues.   Partially dictated using Editor, commissioning. Any errors are unintentional.  Halina Maidens, MD [facility]  09/24/2021

## 2021-09-25 LAB — COMPREHENSIVE METABOLIC PANEL
ALT: 17 IU/L (ref 0–32)
AST: 17 IU/L (ref 0–40)
Albumin/Globulin Ratio: 1.8 (ref 1.2–2.2)
Albumin: 4.2 g/dL (ref 3.6–4.6)
Alkaline Phosphatase: 67 IU/L (ref 44–121)
BUN/Creatinine Ratio: 19 (ref 12–28)
BUN: 18 mg/dL (ref 8–27)
Bilirubin Total: 1.2 mg/dL (ref 0.0–1.2)
CO2: 23 mmol/L (ref 20–29)
Calcium: 9.4 mg/dL (ref 8.7–10.3)
Chloride: 98 mmol/L (ref 96–106)
Creatinine, Ser: 0.93 mg/dL (ref 0.57–1.00)
Globulin, Total: 2.4 g/dL (ref 1.5–4.5)
Glucose: 106 mg/dL — ABNORMAL HIGH (ref 70–99)
Potassium: 5.4 mmol/L — ABNORMAL HIGH (ref 3.5–5.2)
Sodium: 136 mmol/L (ref 134–144)
Total Protein: 6.6 g/dL (ref 6.0–8.5)
eGFR: 61 mL/min/{1.73_m2} (ref >59)

## 2021-09-25 LAB — CBC WITH DIFFERENTIAL/PLATELET
Basophils Absolute: 0.1 10*3/uL (ref 0.0–0.2)
Basos: 1 %
EOS (ABSOLUTE): 0.5 10*3/uL — ABNORMAL HIGH (ref 0.0–0.4)
Eos: 6 %
Hematocrit: 38.7 % (ref 34.0–46.6)
Hemoglobin: 12.9 g/dL (ref 11.1–15.9)
Immature Grans (Abs): 0 10*3/uL (ref 0.0–0.1)
Immature Granulocytes: 0 %
Lymphocytes Absolute: 2.4 10*3/uL (ref 0.7–3.1)
Lymphs: 26 %
MCH: 30.3 pg (ref 26.6–33.0)
MCHC: 33.3 g/dL (ref 31.5–35.7)
MCV: 91 fL (ref 79–97)
Monocytes Absolute: 1 10*3/uL — ABNORMAL HIGH (ref 0.1–0.9)
Monocytes: 11 %
Neutrophils Absolute: 5.3 10*3/uL (ref 1.4–7.0)
Neutrophils: 56 %
Platelets: 297 10*3/uL (ref 150–450)
RBC: 4.26 x10E6/uL (ref 3.77–5.28)
RDW: 12.8 % (ref 11.7–15.4)
WBC: 9.2 10*3/uL (ref 3.4–10.8)

## 2021-09-25 LAB — TSH+FREE T4
Free T4: 1.7 ng/dL (ref 0.82–1.77)
TSH: 2.13 u[IU]/mL (ref 0.450–4.500)

## 2021-09-25 LAB — HEMOGLOBIN A1C
Est. average glucose Bld gHb Est-mCnc: 146 mg/dL
Hgb A1c MFr Bld: 6.7 % — ABNORMAL HIGH (ref 4.8–5.6)

## 2021-09-25 LAB — LIPID PANEL
Chol/HDL Ratio: 3.2 ratio (ref 0.0–4.4)
Cholesterol, Total: 177 mg/dL (ref 100–199)
HDL: 56 mg/dL (ref >39)
LDL Chol Calc (NIH): 104 mg/dL — ABNORMAL HIGH (ref 0–99)
Triglycerides: 92 mg/dL (ref 0–149)
VLDL Cholesterol Cal: 17 mg/dL (ref 5–40)

## 2021-09-29 ENCOUNTER — Other Ambulatory Visit: Payer: Self-pay | Admitting: Internal Medicine

## 2021-09-29 ENCOUNTER — Ambulatory Visit: Payer: Self-pay | Admitting: *Deleted

## 2021-09-29 DIAGNOSIS — E039 Hypothyroidism, unspecified: Secondary | ICD-10-CM

## 2021-09-29 DIAGNOSIS — I10 Essential (primary) hypertension: Secondary | ICD-10-CM

## 2021-09-29 NOTE — Telephone Encounter (Signed)
Requested Prescriptions  Pending Prescriptions Disp Refills   levothyroxine (SYNTHROID) 75 MCG tablet [Pharmacy Med Name: Levothyroxine Sodium 75 MCG Oral Tablet] 90 tablet 3    Sig: TAKE 1 TABLET BY MOUTH ONCE DAILY BEFORE BREAKFAST     Endocrinology:  Hypothyroid Agents Passed - 09/29/2021  9:30 AM      Passed - TSH in normal range and within 360 days    TSH  Date Value Ref Range Status  09/24/2021 2.130 0.450 - 4.500 uIU/mL Final         Passed - Valid encounter within last 12 months    Recent Outpatient Visits          5 days ago Annual physical exam   [facility]an [facility], [facility]   2 weeks ago COVID-19 virus infection   [facility]   3 months ago Acute exacerbation of COPD with asthma [facility])   [facility], [facility]   3 months ago Acute exacerbation of COPD with asthma [facility])   [facility], [facility]   5 months ago Tinea corporis   [facility] Glean [facility], [facility]              losartan (COZAAR) 100 MG tablet [Pharmacy Med Name: Losartan Potassium 100 MG Oral Tablet] 90 tablet 1    Sig: Take 1 tablet by mouth once daily     Cardiovascular:  Angiotensin Receptor Blockers Failed - 09/29/2021  9:30 AM      Failed - K in normal range and within 180 days    Potassium  Date Value Ref Range Status  09/24/2021 5.4 (H) 3.5 - 5.2 mmol/L Final         Passed - Cr in normal range and within 180 days    Creatinine, Ser  Date Value Ref Range Status  09/24/2021 0.93 0.57 - 1.00 mg/dL Final         Passed - Patient is not pregnant      Passed - Last BP in normal range    BP Readings from Last 1 Encounters:  09/24/21 120/70         Passed - Valid encounter within last 6 months    Recent Outpatient Visits          5 days ago Annual physical exam   [facility] Glean [facility], [facility]   2 weeks ago COVID-19 virus infection   [facility] Glean Hess,  MD   3 months ago Acute exacerbation of COPD with asthma [facility])   [facility]   3 months ago Acute exacerbation of COPD with asthma [facility])   [facility], [facility]   5 months ago Tinea corporis   [facility] Glean Hess, MD

## 2021-09-29 NOTE — Telephone Encounter (Signed)
°  Chief Complaint: edema increased- medication request Symptoms: increased edema, increased SOB with exertion Frequency: 1 week Pertinent Negatives: Patient denies   Disposition: [] ED /[] Urgent Care (no appt availability in office) / [] Appointment(In office/virtual)/ []  [facility]/ [] Home Care/ [] Refused Recommended Disposition /[] [facility]/ []  Follow-up with PCP Additional Notes: Patient is calling to request medication- lasix. Patient was seen last [place] in office and states she is Afib patient. She has increased edema and SOB with exertion- which she states is due to fluid and Afib. Patient is requesting lasix Rx as needed- or increase in spirolactone back to 2/day- whatever PCP recommends. Will send message to office- patient advised she may have to be seen

## 2021-09-29 NOTE — Telephone Encounter (Signed)
Reason for Disposition  [1] MODERATE leg swelling (e.g., swelling extends up to knees) AND [2] new-onset or worsening  Answer Assessment - Initial Assessment Questions 1. ONSET: "When did the swelling start?" (e.g., minutes, hours, days)     Over the week end 2. LOCATION: "What part of the leg is swollen?"  "Are both legs swollen or just one leg?"     Legs/feet- Afib, feels she needs lasix 3. SEVERITY: "How bad is the swelling?" (e.g., localized; mild, moderate, severe)  - Localized - small area of swelling localized to one leg  - MILD pedal edema - swelling limited to foot and ankle, pitting edema < 1/4 inch (6 mm) deep, rest and elevation eliminate most or all swelling  - MODERATE edema - swelling of lower leg to knee, pitting edema > 1/4 inch (6 mm) deep, rest and elevation only partially reduce swelling  - SEVERE edema - swelling extends above knee, facial or hand swelling present      Mild/moderate 4. REDNESS: "Does the swelling look red or infected?"     no 5. PAIN: "Is the swelling painful to touch?" If Yes, ask: "How painful is it?"   (Scale 1-10; mild, moderate or severe)     no 6. FEVER: "Do you have a fever?" If Yes, ask: "What is it, how was it measured, and when did it start?"      no 7. CAUSE: "What do you think is causing the leg swelling?"     Fluid retention 8. MEDICAL HISTORY: "Do you have a history of heart failure, kidney disease, liver failure, or cancer?"     Afib 9. RECURRENT SYMPTOM: "Have you had leg swelling before?" If Yes, ask: "When was the last time?" "What happened that time?"     Yes- Afib 10. OTHER SYMPTOMS: "Do you have any other symptoms?" (e.g., chest pain, difficulty breathing)       Breathing changed- increased SOB exertion 11. PREGNANCY: "Is there any chance you are pregnant?" "When was your last menstrual period?"       *No Answer*  Protocols used: Leg Swelling and Edema-A-AH

## 2021-09-29 NOTE — Telephone Encounter (Signed)
Called pt informed her with Dr. Oneal Deputy note. She verbalized understanding.  KP

## 2021-10-01 DIAGNOSIS — G4733 Obstructive sleep apnea (adult) (pediatric): Secondary | ICD-10-CM | POA: Diagnosis not present

## 2021-10-13 DIAGNOSIS — M792 Neuralgia and neuritis, unspecified: Secondary | ICD-10-CM | POA: Diagnosis not present

## 2021-10-13 DIAGNOSIS — L851 Acquired keratosis [keratoderma] palmaris et plantaris: Secondary | ICD-10-CM | POA: Diagnosis not present

## 2021-10-13 DIAGNOSIS — B351 Tinea unguium: Secondary | ICD-10-CM | POA: Diagnosis not present

## 2021-10-13 DIAGNOSIS — M2011 Hallux valgus (acquired), right foot: Secondary | ICD-10-CM | POA: Diagnosis not present

## 2021-10-13 DIAGNOSIS — M2041 Other hammer toe(s) (acquired), right foot: Secondary | ICD-10-CM | POA: Diagnosis not present

## 2021-10-13 DIAGNOSIS — M2042 Other hammer toe(s) (acquired), left foot: Secondary | ICD-10-CM | POA: Diagnosis not present

## 2021-10-13 DIAGNOSIS — I83893 Varicose veins of bilateral lower extremities with other complications: Secondary | ICD-10-CM | POA: Diagnosis not present

## 2021-10-13 DIAGNOSIS — M7741 Metatarsalgia, right foot: Secondary | ICD-10-CM | POA: Diagnosis not present

## 2021-10-13 DIAGNOSIS — D2371 Other benign neoplasm of skin of right lower limb, including hip: Secondary | ICD-10-CM | POA: Diagnosis not present

## 2021-10-13 DIAGNOSIS — M76821 Posterior tibial tendinitis, right leg: Secondary | ICD-10-CM | POA: Diagnosis not present

## 2021-10-13 DIAGNOSIS — M21621 Bunionette of right foot: Secondary | ICD-10-CM | POA: Diagnosis not present

## 2021-10-13 DIAGNOSIS — L909 Atrophic disorder of skin, unspecified: Secondary | ICD-10-CM | POA: Diagnosis not present

## 2021-10-13 DIAGNOSIS — I739 Peripheral vascular disease, unspecified: Secondary | ICD-10-CM | POA: Diagnosis not present

## 2021-10-13 DIAGNOSIS — M21622 Bunionette of left foot: Secondary | ICD-10-CM | POA: Diagnosis not present

## 2021-10-13 DIAGNOSIS — M2012 Hallux valgus (acquired), left foot: Secondary | ICD-10-CM | POA: Diagnosis not present

## 2021-10-13 DIAGNOSIS — M7742 Metatarsalgia, left foot: Secondary | ICD-10-CM | POA: Diagnosis not present

## 2021-10-16 ENCOUNTER — Other Ambulatory Visit: Payer: Self-pay | Admitting: Internal Medicine

## 2021-10-16 DIAGNOSIS — F5101 Primary insomnia: Secondary | ICD-10-CM

## 2021-10-16 NOTE — Telephone Encounter (Signed)
Requested medication (s) are due for refill today - yes ? ?Requested medication (s) are on the active medication list -yes ? ?Future visit scheduled -no ? ?Last refill: 09/22/20-3 #30 ? ?Notes to clinic: Request RF: non delegated Rx ? ?Requested Prescriptions  ?Pending Prescriptions Disp Refills  ? ALPRAZolam (XANAX) 0.25 MG tablet [Pharmacy Med Name: ALPRAZolam 0.25 MG Oral Tablet] 30 tablet 0  ?  Sig: TAKE 1 TABLET BY MOUTH AT BEDTIME  ?  ? Not Delegated - Psychiatry: Anxiolytics/Hypnotics 2 Failed - 10/16/2021  9:21 AM  ?  ?  Failed - This refill cannot be delegated  ?  ?  Failed - Urine Drug Screen completed in last 360 days  ?  ?  Passed - Patient is not pregnant  ?  ?  Passed - Valid encounter within last 6 months  ?  Recent Outpatient Visits   ? ?      ? 3 weeks ago Annual physical exam  ? [facility] Glean [facility], [facility]  ? 1 month ago COVID-19 virus infection  ? [facility] Gle[facility], [facility]  ? 4 months ago Acute exacerbation of COPD with asthma [facility])  ? [facility] Glean [facility], [facility]  ? 4 months ago Acute exacerbation of COPD with asthma [facility])  ? [facility] Glean Hess, MD  ? 6 months ago Tinea corporis  ? [facility] Glean Hess, MD  ? ?  ?  ? ?  ?  ?  ? ? ? ?Requested Prescriptions  ?Pending Prescriptions Disp Refills  ? ALPRAZolam (XANAX) 0.25 MG tablet [Pharmacy Med Name: ALPRAZolam 0.25 MG Oral Tablet] 30 tablet 0  ?  Sig: TAKE 1 TABLET BY MOUTH AT BEDTIME  ?  ? Not Delegated - Psychiatry: Anxiolytics/Hypnotics 2 Failed - 10/16/2021  9:21 AM  ?  ?  Failed - This refill cannot be delegated  ?  ?  Failed - Urine Drug Screen completed in last 360 days  ?  ?  Passed - Patient is not pregnant  ?  ?  Passed - Valid encounter within last 6 months  ?  Recent Outpatient Visits   ? ?      ? 3 weeks ago Annual physical exam  ? [facility] Gle[facility] Hess, [facility]  ? 1 month ago COVID-19 virus infection  ? [facility], [facility]  ? 4 months ago Acute exacerbation of COPD with asthma [facility])  ? [facility], [facility]  ? 4 months ago Acute exacerbation of COPD with asthma [facility])  ? [facility] Glean Hess, [facility]  ? 6 months ago Tinea corporis  ? [facility] Glean Hess, MD  ? ?  ?  ? ?  ?  ?  ? ? ? ?

## 2021-10-16 NOTE — Telephone Encounter (Signed)
Please review. Last office visit 09/24/2021. ? ?KP

## 2021-10-22 DIAGNOSIS — I482 Chronic atrial fibrillation, unspecified: Secondary | ICD-10-CM | POA: Diagnosis not present

## 2021-10-29 ENCOUNTER — Other Ambulatory Visit: Payer: Self-pay | Admitting: Internal Medicine

## 2021-10-30 NOTE — Telephone Encounter (Signed)
Requested Prescriptions  ?Pending Prescriptions Disp Refills  ?? metoprolol tartrate (LOPRESSOR) 25 MG tablet [Pharmacy Med Name: Metoprolol Tartrate 25 MG Oral Tablet] 480 tablet 0  ?  Sig: TAKE 1 TABLET BY MOUTH IN THE MORNING AND AT NOON AND AT BEDTIME - TAKE 1 TABLET THREE TIMES DAILY  ?  ? Cardiovascular:  Beta Blockers Passed - 10/29/2021  5:31 PM  ?  ?  Passed - Last BP in normal range  ?  BP Readings from Last 1 Encounters:  ?09/24/21 120/70  ?   ?  ?  Passed - Last Heart Rate in normal range  ?  Pulse Readings from Last 1 Encounters:  ?09/24/21 86  ?   ?  ?  Passed - Valid encounter within last 6 months  ?  Recent Outpatient Visits   ?      ? 1 month ago Annual physical exam  ? [facility] Glean Hess, MD  ? 1 month ago COVID-19 virus infection  ? [facility] Glean Hess, MD  ? 4 months ago Acute exacerbation of COPD with asthma [facility])  ? [facility] Gle[facility] Hess, [facility]  ? 5 months ago Acute exacerbation of COPD with asthma [facility])  ? [facility] Gle[facility], [facility]  ? 6 months ago Tinea corporis  ? [facility] Glean Hess, MD  ?  ?  ? ?  ?  ?  ? ? ?

## 2021-11-10 ENCOUNTER — Other Ambulatory Visit: Payer: Self-pay | Admitting: Internal Medicine

## 2021-11-10 DIAGNOSIS — F411 Generalized anxiety disorder: Secondary | ICD-10-CM

## 2021-11-11 NOTE — Telephone Encounter (Signed)
Requested medication (s) are due for refill today - yes ? ?Requested medication (s) are on the active medication list -yes ? ?Future visit scheduled -no ? ?Last refill: 08/19/21 #90 ? ?Notes to clinic: Request RF: non delegated Rx ? ?Requested Prescriptions  ?Pending Prescriptions Disp Refills  ? sertraline (ZOLOFT) 25 MG tablet [Pharmacy Med Name: Sertraline HCl 25 MG Oral Tablet] 90 tablet 0  ?  Sig: TAKE 1 TABLET BY MOUTH ONCE DAILY AT BEDTIME  ?  ? Not Delegated - Psychiatry:  Antidepressants - SSRI - sertraline Failed - 11/10/2021  9:05 AM  ?  ?  Failed - This refill cannot be delegated  ?  ?  Passed - AST in normal range and within 360 days  ?  AST  ?Date Value Ref Range Status  ?09/24/2021 17 0 - 40 IU/L Final  ?  ?  ?  ?  Passed - ALT in normal range and within 360 days  ?  ALT  ?Date Value Ref Range Status  ?09/24/2021 17 0 - 32 IU/L Final  ?  ?  ?  ?  Passed - Completed PHQ-2 or PHQ-9 in the last 360 days  ?  ?  Passed - Valid encounter within last 6 months  ?  Recent Outpatient Visits   ? ?      ? 1 month ago Annual physical exam  ? [facility] Glean [facility], MD  ? 2 months ago COVID-19 virus infection  ? [facility] Gle[facility], [facility]  ? 4 months ago Acute exacerbation of COPD with asthma [facility])  ? [facility] Glean Hess, MD  ? 5 months ago Acute exacerbation of COPD with asthma [facility])  ? [facility] Gle[facility] Hess, [facility]  ? 6 months ago Tinea corporis  ? [facility] Glean Hess, MD  ? ?  ?  ? ?  ?  ?  ? ? ? ?Requested Prescriptions  ?Pending Prescriptions Disp Refills  ? sertraline (ZOLOFT) 25 MG tablet [Pharmacy Med Name: Sertraline HCl 25 MG Oral Tablet] 90 tablet 0  ?  Sig: TAKE 1 TABLET BY MOUTH ONCE DAILY AT BEDTIME  ?  ? Not Delegated - Psychiatry:  Antidepressants - SSRI - sertraline Failed - 11/10/2021  9:05 AM  ?  ?  Failed - This refill cannot be delegated  ?  ?  Passed - AST in normal range and within 360 days  ?  AST  ?Date  Value Ref Range Status  ?09/24/2021 17 0 - 40 IU/L Final  ?  ?  ?  ?  Passed - ALT in normal range and within 360 days  ?  ALT  ?Date Value Ref Range Status  ?09/24/2021 17 0 - 32 IU/L Final  ?  ?  ?  ?  Passed - Completed PHQ-2 or PHQ-9 in the last 360 days  ?  ?  Passed - Valid encounter within last 6 months  ?  Recent Outpatient Visits   ? ?      ? 1 month ago Annual physical exam  ? [facility] Glean Hess, MD  ? 2 months ago COVID-19 virus infection  ? [facility], [facility]  ? 4 months ago Acute exacerbation of COPD with asthma [facility])  ? [facility] Gle[facility], MD  ? 5 months ago Acute exacerbation of COPD with asthma [facility])  ? [facility] Halina Maidens  H, MD  ? 6 months ago Tinea corporis  ? [facility] Glean Hess, MD  ? ?  ?  ? ?  ?  ?  ? ? ? ?

## 2021-11-16 ENCOUNTER — Other Ambulatory Visit: Payer: Self-pay | Admitting: Internal Medicine

## 2021-11-16 DIAGNOSIS — F5101 Primary insomnia: Secondary | ICD-10-CM

## 2021-11-17 NOTE — Telephone Encounter (Signed)
Requested medication (s) are due for refill today: yes ? ?Requested medication (s) are on the active medication list: yes ? ?Last refill:  10/19/21 #30/0 ? ?Future visit scheduled: yes ? ?Notes to clinic:  Unable to refill per protocol, cannot delegate. ? ? ?  ?Requested Prescriptions  ?Pending Prescriptions Disp Refills  ? ALPRAZolam (XANAX) 0.25 MG tablet [Pharmacy Med Name: ALPRAZolam 0.25 MG Oral Tablet] 30 tablet 0  ?  Sig: TAKE 1 TABLET BY MOUTH AT BEDTIME  ?  ? Not Delegated - Psychiatry: Anxiolytics/Hypnotics 2 Failed - 11/16/2021  9:24 AM  ?  ?  Failed - This refill cannot be delegated  ?  ?  Failed - Urine Drug Screen completed in last 360 days  ?  ?  Passed - Patient is not pregnant  ?  ?  Passed - Valid encounter within last 6 months  ?  Recent Outpatient Visits   ? ?      ? 1 month ago Annual physical exam  ? [facility] Glean Hess, MD  ? 2 months ago COVID-19 virus infection  ? [facility] Glean Hess, MD  ? 5 months ago Acute exacerbation of COPD with asthma [facility])  ? [facility] Glean Hess, MD  ? 5 months ago Acute exacerbation of COPD with asthma [facility])  ? [facility] Glean Hess, MD  ? 7 months ago Tinea corporis  ? [facility] Glean Hess, MD  ? ?  ?  ? ?  ?  ?  ? ?

## 2021-11-17 NOTE — Telephone Encounter (Signed)
Please review. Last office visit 09/24/2021. ? ?KP

## 2021-11-19 DIAGNOSIS — R0609 Other forms of dyspnea: Secondary | ICD-10-CM | POA: Diagnosis not present

## 2021-11-19 DIAGNOSIS — I482 Chronic atrial fibrillation, unspecified: Secondary | ICD-10-CM | POA: Diagnosis not present

## 2021-11-19 DIAGNOSIS — I10 Essential (primary) hypertension: Secondary | ICD-10-CM | POA: Diagnosis not present

## 2021-12-15 ENCOUNTER — Other Ambulatory Visit: Payer: Self-pay | Admitting: Internal Medicine

## 2021-12-15 DIAGNOSIS — F5101 Primary insomnia: Secondary | ICD-10-CM

## 2021-12-16 NOTE — Telephone Encounter (Signed)
Requested medication (s) are due for refill today: yes ? ?Requested medication (s) are on the active medication list: yes ? ?Last refill:  11/19/21 ? ?Future visit scheduled: yes ? ?Notes to clinic:  Unable to refill per protocol, cannot delegate. ? ? ? ?  ?Requested Prescriptions  ?Pending Prescriptions Disp Refills  ? ALPRAZolam (XANAX) 0.25 MG tablet [Pharmacy Med Name: ALPRAZolam 0.25 MG Oral Tablet] 30 tablet 0  ?  Sig: TAKE 1 TABLET BY MOUTH AT BEDTIME  ?  ? Not Delegated - Psychiatry: Anxiolytics/Hypnotics 2 Failed - 12/15/2021  3:01 PM  ?  ?  Failed - This refill cannot be delegated  ?  ?  Failed - Urine Drug Screen completed in last 360 days  ?  ?  Passed - Patient is not pregnant  ?  ?  Passed - Valid encounter within last 6 months  ?  Recent Outpatient Visits   ? ?      ? 2 months ago Annual physical exam  ? [facility] Glean Hess, MD  ? 3 months ago COVID-19 virus infection  ? [facility] Glean Hess, MD  ? 6 months ago Acute exacerbation of COPD with asthma [facility])  ? [facility] Glean Hess, MD  ? 6 months ago Acute exacerbation of COPD with asthma [facility])  ? [facility] Glean Hess, MD  ? 8 months ago Tinea corporis  ? [facility] Glean Hess, MD  ? ?  ?  ? ? ?  ?  ?  ? ? ?

## 2021-12-22 ENCOUNTER — Other Ambulatory Visit: Payer: Self-pay | Admitting: Internal Medicine

## 2021-12-22 DIAGNOSIS — I10 Essential (primary) hypertension: Secondary | ICD-10-CM

## 2021-12-23 NOTE — Telephone Encounter (Signed)
Requested Prescriptions  ?Pending Prescriptions Disp Refills  ?? spironolactone (ALDACTONE) 25 MG tablet [Pharmacy Med Name: Spironolactone 25 MG Oral Tablet] 180 tablet 0  ?  Sig: Take 1 tablet by mouth twice daily  ?  ? Cardiovascular: Diuretics - Aldosterone Antagonist Failed - 12/22/2021  9:30 AM  ?  ?  Failed - K in normal range and within 180 days  ?  Potassium  ?Date Value Ref Range Status  ?09/24/2021 5.4 (H) 3.5 - 5.2 mmol/L Final  ?   ?  ?  Passed - Cr in normal range and within 180 days  ?  Creatinine, Ser  ?Date Value Ref Range Status  ?09/24/2021 0.93 0.57 - 1.00 mg/dL Final  ?   ?  ?  Passed - Na in normal range and within 180 days  ?  Sodium  ?Date Value Ref Range Status  ?09/24/2021 136 134 - 144 mmol/L Final  ?   ?  ?  Passed - eGFR is 30 or above and within 180 days  ?  GFR calc Af Amer  ?Date Value Ref Range Status  ?08/14/2020 72 >59 mL/min/1.73 Final  ?  Comment:  ?  **In accordance with recommendations from the NKF-ASN Task force,** ?  [facility] is in the process of updating its eGFR calculation to the ?  2021 CKD-EPI creatinine equation that estimates kidney function ?  without a race variable. ?  ? ?GFR, Estimated  ?Date Value Ref Range Status  ?06/05/2020 >60 >60 mL/min Final  ?  Comment:  ?  (NOTE) ?Calculated using the CKD-EPI Creatinine Equation (2021) ?  ? ?GFR calc non Af Amer  ?Date Value Ref Range Status  ?08/14/2020 63 >59 mL/min/1.73 Final  ? ?eGFR  ?Date Value Ref Range Status  ?09/24/2021 61 >59 mL/min/1.73 Final  ?   ?  ?  Passed - Last BP in normal range  ?  BP Readings from Last 1 Encounters:  ?09/24/21 120/70  ?   ?  ?  Passed - Valid encounter within last 6 months  ?  Recent Outpatient Visits   ?      ? 3 months ago Annual physical exam  ? [facility] Glean Hess, MD  ? 3 months ago COVID-19 virus infection  ? [facility] Glean Hess, MD  ? 6 months ago Acute exacerbation of COPD with asthma [facility])  ? [facility] Glean Hess, MD  ? 6  months ago Acute exacerbation of COPD with asthma [facility])  ? [facility] Glean Hess, MD  ? 8 months ago Tinea corporis  ? [facility] Glean Hess, MD  ?  ?  ? ?  ?  ?  ? ?

## 2021-12-24 ENCOUNTER — Ambulatory Visit: Payer: Self-pay

## 2021-12-24 NOTE — Telephone Encounter (Signed)
Pt refused appt. Pt needs to be seen so doctor can listen to her lungs and get her oxygen level. ? ?KP ?

## 2021-12-24 NOTE — Telephone Encounter (Signed)
Pt called in stating she was having some chills yesterday, and has a slight cough, she mentioned she doesn't feel bad, but still wants to speak with a nurse about what she can do, please advise.  ? ? ?Chief Complaint: Cough with yellow mucus, chills, no fever.Using her nebulizer. ?Symptoms: Above ?Frequency: Started yesterday ?Pertinent Negatives: Patient denies fever ?Disposition: '[]'$ ED /'[]'$ Urgent Care (no appt availability in office) / '[]'$ Appointment(In office/virtual)/ '[]'$  [facility]/ '[]'$ Home Care/ '[x]'$ Refused Recommended Disposition /'[]'$ [place] [facility]/ '[]'$  Follow-up with PCP ?Additional Notes: Pt. Refuses in office visit. "It's too hard for me to get in." Requests virtual visit. Please advise pt. ?Reason for Disposition ? [1] MILD difficulty breathing (e.g., minimal/no SOB at rest, SOB with walking, pulse <100) AND [2] still present when not coughing ? ?Answer Assessment - Initial Assessment Questions ?1. ONSET: "When did the cough begin?"  ?    Yesterday ?2. SEVERITY: "How bad is the cough today?"  ?    Moderate ?3. SPUTUM: "Describe the color of your sputum" (none, dry cough; clear, white, yellow, green) ?    Yellow ?4. HEMOPTYSIS: "Are you coughing up any blood?" If so ask: "How much?" (flecks, streaks, tablespoons, etc.) ?    No ?5. DIFFICULTY BREATHING: "Are you having difficulty breathing?" If Yes, ask: "How bad is it?" (e.g., mild, moderate, severe)  ?  - MILD: No SOB at rest, mild SOB with walking, speaks normally in sentences, can lie down, no retractions, pulse < 100.  ?  - MODERATE: SOB at rest, SOB with minimal exertion and prefers to sit, cannot lie down flat, speaks in phrases, mild retractions, audible wheezing, pulse 100-120.  ?  - SEVERE: Very SOB at rest, speaks in single words, struggling to breathe, sitting hunched forward, retractions, pulse > 120  ?    Mild ?6. FEVER: "Do you have a fever?" If Yes, ask: "What is your temperature, how was it measured, and when did it start?" ?     No ?7. CARDIAC HISTORY: "Do you have any history of heart disease?" (e.g., heart attack, congestive heart failure)  ?    No ?8. LUNG HISTORY: "Do you have any history of lung disease?"  (e.g., pulmonary embolus, asthma, emphysema) ?    Asthma ?9. PE RISK FACTORS: "Do you have a history of blood clots?" (or: recent major surgery, recent prolonged travel, bedridden) ?    No ?10. OTHER SYMPTOMS: "Do you have any other symptoms?" (e.g., runny nose, wheezing, chest pain) ?      Wheezing ?11. PREGNANCY: "Is there any chance you are pregnant?" "When was your last menstrual period?" ?      No ?12. TRAVEL: "Have you traveled out of the country in the last month?" (e.g., travel history, exposures) ?      No ? ?Protocols used: Cough - Acute Productive-A-AH ? ?

## 2021-12-25 ENCOUNTER — Ambulatory Visit
Admission: EM | Admit: 2021-12-25 | Discharge: 2021-12-25 | Disposition: A | Discharge status: Home / Self Care | Payer: Medicare Other | Attending: Physician Assistant | Admitting: Physician Assistant

## 2021-12-25 ENCOUNTER — Other Ambulatory Visit: Payer: Self-pay

## 2021-12-25 ENCOUNTER — Ambulatory Visit (INDEPENDENT_AMBULATORY_CARE_PROVIDER_SITE_OTHER): Payer: Medicare Other

## 2021-12-25 DIAGNOSIS — R062 Wheezing: Secondary | ICD-10-CM

## 2021-12-25 DIAGNOSIS — R051 Acute cough: Secondary | ICD-10-CM

## 2021-12-25 DIAGNOSIS — J45901 Unspecified asthma with (acute) exacerbation: Secondary | ICD-10-CM | POA: Diagnosis not present

## 2021-12-25 DIAGNOSIS — R059 Cough, unspecified: Secondary | ICD-10-CM | POA: Diagnosis not present

## 2021-12-25 DIAGNOSIS — R0902 Hypoxemia: Secondary | ICD-10-CM | POA: Diagnosis not present

## 2021-12-25 MED ORDER — PREDNISONE 20 MG PO TABS: 40.0000 mg | ORAL_TABLET | Freq: Every day | ORAL | 0 refills | Status: AC

## 2021-12-25 MED ORDER — IPRATROPIUM BROMIDE 0.06 % NA SOLN
2.0000 | Freq: Four times a day (QID) | NASAL | 0 refills | Status: AC
Start: 2021-12-25 — End: ?

## 2021-12-25 MED ORDER — PROMETHAZINE-DM 6.25-15 MG/5ML PO SYRP: 5.0000 mL | ORAL_SOLUTION | Freq: Four times a day (QID) | ORAL | 0 refills | Status: DC | PRN

## 2021-12-25 NOTE — ED Triage Notes (Signed)
Pt c/o congestion, wheezing, headache x3days ? ?Pt states that her Oxygen has been around 93% at home.  ? ?Pt has been using her nebulizer ever 4 hours at home.  ? ?Pt had covid In January.  ? ?Pt took tylenol before arriving ? ?Pt is not worried about covid or flu ?

## 2021-12-25 NOTE — Discharge Instructions (Signed)
-  Your x-ray does not show any evidence of pneumonia.  Your symptoms are likely related to asthma exacerbation.  It could be allergies that flared this up or a virus. ?- Continue with your breathing treatments at home. ?- Increase your rest. ?- I have sent cough medicine to the pharmacy.  Do not take your Zyrtec while taking this since it contains an antihistamine.  I also sent a nasal spray. ?- If fever, worsening breathing problems, chest pain, weakness you need to be seen again.  Go to emergency department for any severe acute worsening of symptoms. ?

## 2021-12-25 NOTE — ED Provider Notes (Signed)
?[place] ? ? ? ?CSN: [id number] ?Arrival date & time: 12/25/21  1143 ? ? ?  ? ?History   ?Chief Complaint ?Chief Complaint  ?Patient presents with  ? Wheezing  ? Nasal Congestion  ? ? ?HPI ?Ariana Guzman is a 81 y.o. female with history of asthma, allergies, arthritis, atrial fibrillation, peripheral artery disease, hypertension, and sleep apnea. ? ?Today, patient is presenting for 3-day history of cough, congestion, wheezing/shortness of breath, fatigue and headaches.  Symptoms started after she was outside in her garden.  Denies associated fever.  Patient says she has been checking her oxygen at home and it is about 93%.  She says she has been having to use her nebulizer every 4 hours at home.  Also taking Mucinex and says it has helped a slight amount.  Reports history of COVID-19 in January of this year and noted not to have any again on this time.  No sick contacts presently.  Patient concerned about infection in her chest.  She has no other complaints. ? ?HPI ? ?Past Medical History:  ?Diagnosis Date  ? Allergies   ? Arthritis   ? Asthma   ? Atrial fibrillation ([place])   ? Hypertension   ? Peripheral arterial disease ([place])   ? Sleep apnea   ? CPAP  ? ? ?Patient Active Problem List  ? Diagnosis Date Noted  ? Slow transit constipation 12/25/2020  ? Bursitis of hip 06/05/2020  ? 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 Hyponatremia 04/05/2020  ? Environmental and seasonal allergies 04/03/2020  ? Pre-diabetes 05/29/2018  ? Primary insomnia 09/15/2017  ? Generalized anxiety disorder 09/15/2017  ? Essential hypertension 09/09/2017  ? 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 Bilateral lower extremity edema 09/09/2017  ? Current use of long term anticoagulation 07/28/2017  ? 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 Otitis externa, chronic 12/03/2015  ? Obstructive sleep apnea 10/07/2015  ? Asthma, persistent controlled 09/25/2015  ? Acquired hypothyroidism  02/29/2012  ? Chronic atrial fibrillation ([place]) 02/29/2012  ? Hyperlipidemia 02/29/2012  ? ? ?Past Surgical History:  ?Procedure Laterality Date  ? ABDOMINAL HYSTERECTOMY  1991  ? uterus only- still has cervix - done for prolapse  ? CATARACT EXTRACTION W/PHACO Left 11/24/2020  ? Procedure: CATARACT EXTRACTION PHACO AND INTRAOCULAR LENS PLACEMENT (IOC) LEFT 6.99 00:46.1;  Surgeon: Eulogio Bear, MD;  Location: [facility];  Service: Ophthalmology;  Laterality: Left;  ? CATARACT EXTRACTION W/PHACO Right 12/08/2020  ? Procedure: CATARACT EXTRACTION PHACO AND INTRAOCULAR LENS PLACEMENT (IOC) RIGHT 5.13 00:34.2;  Surgeon: Eulogio Bear, MD;  Location: [place];  Service: Ophthalmology;  Laterality: Right;  ? COLONOSCOPY  2009  ? normal  ? TOE SURGERY    ? ? ?OB History   ?No obstetric history on file. ?  ? ? ? ?Home Medications   ? ?Prior to Admission medications   ?Medication Sig Start Date End Date Taking? Authorizing Provider  ?albuterol (PROVENTIL) (2.5 MG/3ML) 0.083% nebulizer solution Take 2.5 mg by nebulization every 6 (six) hours as needed. 01/11/20  Yes [provider]  ?ALPRAZolam Duanne Moron) 0.25 MG tablet TAKE 1 TABLET BY MOUTH AT BEDTIME 12/18/21  Yes Glean Hess, MD  ?Ascorbic Acid (VITAMIN C) 1000 MG tablet Take 1,000 mg by mouth daily.   Yes [provider]  ?Calcium Carb-Cholecalciferol 251 318 9801 MG-UNIT TABS Take by mouth.   Yes [provider]  ?cetirizine (ZYRTEC) 10 MG tablet Take 10 mg  by mouth 2 (two) times daily.   Yes [provider]  ?cholecalciferol (VITAMIN D) 1000 units tablet Take 1,000 Units by mouth daily.   Yes [provider]  ?Fluticasone-Salmeterol (ADVAIR) 250-50 MCG/DOSE AEPB Inhale 1 puff into the lungs 2 (two) times daily.   Yes [provider]  ?ipratropium (ATROVENT) 0.06 % nasal spray Place 2 sprays into both nostrils 4 (four) times daily. 12/25/21  Yes Laurene Footman B, PA-C  ?latanoprost (XALATAN)  0.005 % ophthalmic solution 1 drop at bedtime. 03/30/20  Yes [provider]  ?levothyroxine (SYNTHROID) 75 MCG tablet TAKE 1 TABLET BY MOUTH ONCE DAILY BEFORE BREAKFAST 09/29/21  Yes Glean Hess, MD  ?losartan (COZAAR) 100 MG tablet Take 1 tablet by mouth once daily 09/29/21  Yes Glean Hess, MD  ?Magnesium 100 MG CAPS Take by mouth.   Yes [provider]  ?MELATONIN PO Take by mouth.   Yes [provider]  ?metoprolol tartrate (LOPRESSOR) 25 MG tablet TAKE 1 TABLET BY MOUTH IN THE MORNING AND AT NOON AND AT BEDTIME - TAKE 1 TABLET THREE TIMES DAILY 10/30/21  Yes Glean Hess, MD  ?montelukast (SINGULAIR) 10 MG tablet Take 10 mg by mouth daily. 07/06/20  Yes [provider]  ?Multiple Vitamins-Minerals (OCUVITE EYE HEATLH GUMMIES PO) Take by mouth daily.   Yes [provider]  ?Omega-3 Fatty Acids (FISH OIL PO) Take by mouth.   Yes [provider]  ?predniSONE (DELTASONE) 20 MG tablet Take 2 tablets (40 mg total) by mouth daily for 5 days. 12/25/21 12/30/21 Yes Danton Clap, PA-C  ?promethazine-dextromethorphan (PROMETHAZINE-DM) 6.25-15 MG/5ML syrup Take 5 mLs by mouth 4 (four) times daily as needed. 12/25/21  Yes Danton Clap, PA-C  ?Rivaroxaban (XARELTO) 15 MG TABS tablet Take 15 mg by mouth daily with supper.    Yes [provider]  ?sertraline (ZOLOFT) 25 MG tablet TAKE 1 TABLET BY MOUTH ONCE DAILY AT BEDTIME 11/11/21  Yes Glean Hess, MD  ?spironolactone (ALDACTONE) 25 MG tablet Take 1 tablet by mouth twice daily 12/23/21  Yes Glean Hess, MD  ?clotrimazole-betamethasone (LOTRISONE) cream Apply 1 application topically 2 (two) times daily. To rash under breast 04/16/21   Glean Hess, MD  ?fexofenadine (ALLEGRA) 180 MG tablet Take 180 mg by mouth daily.  03/28/19  [provider]  ? ? ?Family History ?Family History  ?Problem Relation Age of Onset  ? Breast cancer Other 56  ? Dementia Mother   ? Cancer Father   ?      prostate and bone  ? Heart attack Sister   ? ? ?Social History ?Social History  ? ?Tobacco Use  ? Smoking status: Former  ?  Packs/day: 0.50  ?  Years: 29.00  ?  Pack years: 14.50  ?  Types: Cigarettes  ?  Quit date: 09/28/1989  ?  Years since quitting: 32.2  ? Smokeless tobacco: Never  ? Tobacco comments:  ?  smoking cessation materials not required  ?Vaping Use  ? Vaping Use: Never used  ?Substance Use Topics  ? Alcohol use: No  ? Drug use: No  ? ? ? ?Allergies   ?Tramadol and Meloxicam ? ? ?Review of Systems ?Review of Systems  ?Constitutional:  Positive for fatigue. Negative for chills, diaphoresis and fever.  ?HENT:  Positive for congestion, postnasal drip and rhinorrhea. Negative for ear pain, sinus pressure, sinus pain and sore throat.   ?Respiratory:  Positive for cough, shortness of breath  and wheezing.   ?Cardiovascular:  Negative for chest pain.  ?Gastrointestinal:  Negative for abdominal pain, diarrhea, nausea and vomiting.  ?Musculoskeletal:  Negative for arthralgias and myalgias.  ?Skin:  Negative for rash.  ?Neurological:  Negative for weakness and headaches.  ?Hematological:  Negative for adenopathy.  ? ? ?Physical Exam ?Triage Vital Signs ?ED Triage Vitals  ?Enc Vitals Group  ?   BP   ?   Pulse   ?   Resp   ?   Temp   ?   Temp src   ?   SpO2   ?   Weight   ?   Height   ?   Head Circumference   ?   Peak Flow   ?   Pain Score   ?   Pain Loc   ?   Pain Edu?   ?   Excl. in [place]?   ? ?No data found. ? ?Updated Vital Signs ?BP (!) 161/92 (BP Location: Left Arm)   Pulse 70   Temp 98.4 ?F (36.9 ?C) (Oral)   Resp 18   Ht '5\' 6"'$  (1.676 m)   Wt 200 lb (90.7 kg)   SpO2 97%   BMI 32.28 kg/m?  ?   ? ?Physical Exam ?Vitals and nursing note reviewed.  ?Constitutional:   ?   General: She is not in acute distress. ?   Appearance: Normal appearance. She is ill-appearing. She is not toxic-appearing.  ?HENT:  ?   Head: Normocephalic and atraumatic.  ?   Nose: Congestion present.  ?   Mouth/Throat:  ?   Mouth:  Mucous membranes are moist.  ?   Pharynx: Oropharynx is clear.  ?Eyes:  ?   General: No scleral icterus.    ?   Right eye: No discharge.     ?   Left eye: No discharge.  ?   Conjunctiva/sclera: Conjunctivae norm

## 2021-12-29 ENCOUNTER — Ambulatory Visit (INDEPENDENT_AMBULATORY_CARE_PROVIDER_SITE_OTHER): Payer: Medicare Other

## 2021-12-29 ENCOUNTER — Ambulatory Visit
Admission: EM | Admit: 2021-12-29 | Discharge: 2021-12-29 | Disposition: A | Discharge status: Home / Self Care | Payer: Medicare Other | Attending: Emergency Medicine | Admitting: Emergency Medicine

## 2021-12-29 DIAGNOSIS — J45901 Unspecified asthma with (acute) exacerbation: Secondary | ICD-10-CM

## 2021-12-29 DIAGNOSIS — J45998 Other asthma: Secondary | ICD-10-CM | POA: Diagnosis not present

## 2021-12-29 DIAGNOSIS — R059 Cough, unspecified: Secondary | ICD-10-CM | POA: Diagnosis not present

## 2021-12-29 DIAGNOSIS — R0602 Shortness of breath: Secondary | ICD-10-CM | POA: Diagnosis not present

## 2021-12-29 MED ORDER — PREDNISONE 10 MG (21) PO TBPK: ORAL_TABLET | ORAL | 0 refills | Status: DC

## 2021-12-29 MED ORDER — ALBUTEROL SULFATE (2.5 MG/3ML) 0.083% IN NEBU
2.5000 mg | INHALATION_SOLUTION | Freq: Once | RESPIRATORY_TRACT | Status: AC
  Administered 2021-12-29: 2.5 mg via RESPIRATORY_TRACT

## 2021-12-29 MED ORDER — LEVOFLOXACIN 500 MG PO TABS: 500.0000 mg | ORAL_TABLET | Freq: Every day | ORAL | 0 refills | Status: DC

## 2021-12-29 NOTE — ED Provider Notes (Signed)
?[facility] ? ? ? ?CSN: [id number] ?Arrival date & time: 12/29/21  1216 ? ? ?  ? ?History   ?Chief Complaint ?Chief Complaint  ?Patient presents with  ? Cough  ? Wheezing  ? ? ?HPI ?Ariana Guzman is a 81 y.o. female.  ? ?HPI ? ?81 year old female here for evaluation of respiratory complaints. ? ?Patient was evaluated in this urgent care 4 days ago for nasal congestion, cough, wheezing, shortness of breath, fatigue, and headaches.  Chest x-ray was unremarkable and she was discharged home on prednisone, Promethazine DM cough syrup, and Atrovent nasal spray.  She states that her symptoms have continued to worsen and she has increasing shortness of breath, wheezing, and mild dyspnea.  She is able to speak in full sentences however.  She has been using her nebulizer and Advair at home without significant improvement of symptoms she is also taking Mucinex which has allowed her to cough up some scant thick yellow sputum.  She still endorses nasal congestion but reports that her ear pain, headache, and sore throat have all improved.  She denies any fever.  Her daughter is at bedside.  She denies any chest pain or swelling of her ankles and feet above baseline. ? ?Past Medical History:  ?Diagnosis Date  ? Allergies   ? Arthritis   ? Asthma   ? Atrial fibrillation ([place])   ? Hypertension   ? Peripheral arterial disease ([place])   ? Sleep apnea   ? CPAP  ? ? ?Patient Active Problem List  ? Diagnosis Date Noted  ? Slow transit constipation 12/25/2020  ? Bursitis of hip 06/05/2020  ? 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 Hyponatremia 04/05/2020  ? Environmental and seasonal allergies 04/03/2020  ? Pre-diabetes 05/29/2018  ? Primary insomnia 09/15/2017  ? Generalized anxiety disorder 09/15/2017  ? Essential hypertension 09/09/2017  ? 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 Bilateral lower extremity edema 09/09/2017  ? Current use of long term anticoagulation 07/28/2017  ? Bilateral high  frequency sensorineural hearing loss 12/03/2015  ? Otitis externa, chronic 12/03/2015  ? Obstructive sleep apnea 10/07/2015  ? Asthma, persistent controlled 09/25/2015  ? Acquired hypothyroidism 02/29/2012  ? Chronic atrial fibrillation ([place]) 02/29/2012  ? Hyperlipidemia 02/29/2012  ? ? ?Past Surgical History:  ?Procedure Laterality Date  ? ABDOMINAL HYSTERECTOMY  1991  ? uterus only- still has cervix - done for prolapse  ? CATARACT EXTRACTION W/PHACO Left 11/24/2020  ? Procedure: CATARACT EXTRACTION PHACO AND INTRAOCULAR LENS PLACEMENT (IOC) LEFT 6.99 00:46.1;  Surgeon: Eulogio Bear, MD;  Location: [place] [facility];  Service: Ophthalmology;  Laterality: Left;  ? CATARACT EXTRACTION W/PHACO Right 12/08/2020  ? Procedure: CATARACT EXTRACTION PHACO AND INTRAOCULAR LENS PLACEMENT (IOC) RIGHT 5.13 00:34.2;  Surgeon: Eulogio Bear, MD;  Location: [place];  Service: Ophthalmology;  Laterality: Right;  ? COLONOSCOPY  2009  ? normal  ? TOE SURGERY    ? ? ?OB History   ?No obstetric history on file. ?  ? ? ? ?Home Medications   ? ?Prior to Admission medications   ?Medication Sig Start Date End Date Taking? Authorizing Provider  ?levofloxacin (LEVAQUIN) 500 MG tablet Take 1 tablet (500 mg total) by mouth daily. 12/29/21  Yes Margarette Canada, NP  ?predniSONE (STERAPRED UNI-PAK 21 TAB) 10 MG (21) TBPK tablet Take 6 tablets on day 1, 5 tablets day 2, 4 tablets day 3, 3 tablets day 4, 2 tablets day 5, 1 tablet day 6  12/29/21  Yes Margarette Canada, NP  ?albuterol (PROVENTIL) (2.5 MG/3ML) 0.083% nebulizer solution Take 2.5 mg by nebulization every 6 (six) hours as needed. 01/11/20   [provider]  ?ALPRAZolam Duanne Moron) 0.25 MG tablet TAKE 1 TABLET BY MOUTH AT BEDTIME 12/18/21   Glean Hess, MD  ?Ascorbic Acid (VITAMIN C) 1000 MG tablet Take 1,000 mg by mouth daily.    [provider]  ?Calcium Carb-Cholecalciferol [phone] MG-UNIT TABS Take by mouth.    [provider]  ?cetirizine  (ZYRTEC) 10 MG tablet Take 10 mg by mouth 2 (two) times daily.    [provider]  ?cholecalciferol (VITAMIN D) 1000 units tablet Take 1,000 Units by mouth daily.    [provider]  ?clotrimazole-betamethasone (LOTRISONE) cream Apply 1 application topically 2 (two) times daily. To rash under breast 04/16/21   Glean Hess, MD  ?Fluticasone-Salmeterol (ADVAIR) 250-50 MCG/DOSE AEPB Inhale 1 puff into the lungs 2 (two) times daily.    [provider]  ?ipratropium (ATROVENT) 0.06 % nasal spray Place 2 sprays into both nostrils 4 (four) times daily. 12/25/21   [place] Clap, PA-C  ?latanoprost (XALATAN) 0.005 % ophthalmic solution 1 drop at bedtime. 03/30/20   [provider]  ?levothyroxine (SYNTHROID) 75 MCG tablet TAKE 1 TABLET BY MOUTH ONCE DAILY BEFORE BREAKFAST 09/29/21   Glean Hess, MD  ?losartan (COZAAR) 100 MG tablet Take 1 tablet by mouth once daily 09/29/21   Glean Hess, MD  ?Magnesium 100 MG CAPS Take by mouth.    [provider]  ?MELATONIN PO Take by mouth.    [provider]  ?metoprolol tartrate (LOPRESSOR) 25 MG tablet TAKE 1 TABLET BY MOUTH IN THE MORNING AND AT NOON AND AT BEDTIME - TAKE 1 TABLET THREE TIMES DAILY 10/30/21   Glean Hess, MD  ?montelukast (SINGULAIR) 10 MG tablet Take 10 mg by mouth daily. 07/06/20   [provider]  ?Multiple Vitamins-Minerals (OCUVITE EYE HEATLH GUMMIES PO) Take by mouth daily.    [provider]  ?Omega-3 Fatty Acids (FISH OIL PO) Take by mouth.    [provider]  ?predniSONE (DELTASONE) 20 MG tablet Take 2 tablets (40 mg total) by mouth daily for 5 days. 12/25/21 12/30/21  [place] Clap, PA-C  ?promethazine-dextromethorphan (PROMETHAZINE-DM) 6.25-15 MG/5ML syrup Take 5 mLs by mouth 4 (four) times daily as needed. 12/25/21   [place] Clap, PA-C  ?Rivaroxaban (XARELTO) 15 MG TABS tablet Take 15 mg by mouth daily with supper.     [provider]   ?sertraline (ZOLOFT) 25 MG tablet TAKE 1 TABLET BY MOUTH ONCE DAILY AT BEDTIME 11/11/21   Glean Hess, MD  ?spironolactone (ALDACTONE) 25 MG tablet Take 1 tablet by mouth twice daily 12/23/21   Glean Hess, MD  ?fexofenadine (ALLEGRA) 180 MG tablet Take 180 mg by mouth daily.  03/28/19  [provider]  ? ? ?Family History ?Family History  ?Problem Relation Age of Onset  ? Breast cancer Other 56  ? Dementia Mother   ? Cancer Father   ?     prostate and bone  ? Heart attack Sister   ? ? ?Social History ?Social History  ? ?Tobacco Use  ? Smoking status: Former  ?  Packs/day: 0.50  ?  Years: 29.00  ?  Pack years: 14.50  ?  Types: Cigarettes  ?  Quit date: 09/28/1989  ?  Years since quitting: 32.2  ? Smokeless tobacco: Never  ?  Tobacco comments:  ?  smoking cessation materials not required  ?Vaping Use  ? Vaping Use: Never used  ?Substance Use Topics  ? Alcohol use: No  ? Drug use: No  ? ? ? ?Allergies   ?Tramadol and Meloxicam ? ? ?Review of Systems ?Review of Systems  ?Constitutional:  Negative for fever.  ?HENT:  Positive for congestion, ear pain, sinus pressure and sore throat.   ?Respiratory:  Positive for cough, shortness of breath and wheezing.   ?Neurological:  Positive for headaches.  ?Hematological: Negative.   ?Psychiatric/Behavioral: Negative.    ? ? ?Physical Exam ?Triage Vital Signs ?ED Triage Vitals  ?Enc Vitals Group  ?   BP 12/29/21 1248 (!) 163/103  ?   Pulse Rate 12/29/21 1248 88  ?   Resp 12/29/21 1248 18  ?   Temp 12/29/21 1248 98.8 ?F (37.1 ?C)  ?   Temp Source 12/29/21 1248 Oral  ?   SpO2 12/29/21 1248 97 %  ?   Weight 12/29/21 1246 199 lb 15.3 oz (90.7 kg)  ?   Height 12/29/21 1246 '5\' 6"'$  (1.676 m)  ?   Head Circumference --   ?   Peak Flow --   ?   Pain Score 12/29/21 1246 8  ?   Pain Loc --   ?   Pain Edu? --   ?   Excl. in [place]? --   ? ?No data found. ? ?Updated Vital Signs ?BP (!) 163/103 (BP Location: Left Arm)   Pulse 88   Temp 98.8 ?F (37.1 ?C) (Oral)   Resp 18   Ht 5'  6" (1.676 m)   Wt 199 lb 15.3 oz (90.7 kg)   SpO2 97%   BMI 32.27 kg/m?  ? ?Visual Acuity ?Right Eye Distance:   ?Left Eye Distance:   ?Bilateral Distance:   ? ?Right Eye Near:   ?Left Eye Near:    ?Bilateral Near:    ? ?Physic

## 2021-12-29 NOTE — ED Triage Notes (Signed)
Pt was here on 12/25/21 for cough, wheezing and states that it has continued. ? ?Pt states that the cough syrup did not help.  ?

## 2021-12-29 NOTE — Discharge Instructions (Addendum)
Take the Levaquin, 5 mg daily, for the next 7 days. ? ?Finish your current prescription of prednisone before you start the prednisone taper. ? ?You may continue to use your albuterol nebulizer and inhaler, 2 puffs or 1 treatment every 4-6 hours, as needed for shortness of breath and wheezing. ? ?Continue to use your Advair as directed. ? ?If your symptoms not seem to be improving I recommend you follow-up with your pulmonologist.  If you have worsening shortness of breath that is not improved by your albuterol with prednisone you need to go to the ER for evaluation. ?

## 2022-01-12 DIAGNOSIS — M5431 Sciatica, right side: Secondary | ICD-10-CM | POA: Diagnosis not present

## 2022-01-12 DIAGNOSIS — M21621 Bunionette of right foot: Secondary | ICD-10-CM | POA: Diagnosis not present

## 2022-01-12 DIAGNOSIS — I83893 Varicose veins of bilateral lower extremities with other complications: Secondary | ICD-10-CM | POA: Diagnosis not present

## 2022-01-12 DIAGNOSIS — M792 Neuralgia and neuritis, unspecified: Secondary | ICD-10-CM | POA: Diagnosis not present

## 2022-01-12 DIAGNOSIS — L84 Corns and callosities: Secondary | ICD-10-CM | POA: Diagnosis not present

## 2022-01-12 DIAGNOSIS — I739 Peripheral vascular disease, unspecified: Secondary | ICD-10-CM | POA: Diagnosis not present

## 2022-01-12 DIAGNOSIS — L909 Atrophic disorder of skin, unspecified: Secondary | ICD-10-CM | POA: Diagnosis not present

## 2022-01-12 DIAGNOSIS — M2011 Hallux valgus (acquired), right foot: Secondary | ICD-10-CM | POA: Diagnosis not present

## 2022-01-12 DIAGNOSIS — M25571 Pain in right ankle and joints of right foot: Secondary | ICD-10-CM | POA: Diagnosis not present

## 2022-01-12 DIAGNOSIS — M7741 Metatarsalgia, right foot: Secondary | ICD-10-CM | POA: Diagnosis not present

## 2022-01-12 DIAGNOSIS — B351 Tinea unguium: Secondary | ICD-10-CM | POA: Diagnosis not present

## 2022-01-12 DIAGNOSIS — L851 Acquired keratosis [keratoderma] palmaris et plantaris: Secondary | ICD-10-CM | POA: Diagnosis not present

## 2022-01-12 DIAGNOSIS — M2041 Other hammer toe(s) (acquired), right foot: Secondary | ICD-10-CM | POA: Diagnosis not present

## 2022-01-14 DIAGNOSIS — G4733 Obstructive sleep apnea (adult) (pediatric): Secondary | ICD-10-CM | POA: Diagnosis not present

## 2022-01-25 ENCOUNTER — Ambulatory Visit (INDEPENDENT_AMBULATORY_CARE_PROVIDER_SITE_OTHER): Payer: Medicare Other | Admitting: Internal Medicine

## 2022-01-25 ENCOUNTER — Encounter: Payer: Self-pay | Admitting: Internal Medicine

## 2022-01-25 ENCOUNTER — Ambulatory Visit
Admission: RE | Admit: 2022-01-25 | Discharge: 2022-01-25 | Disposition: A | Discharge status: Home / Self Care | Payer: Medicare Other | Source: Ambulatory Visit | Attending: Internal Medicine | Admitting: Internal Medicine

## 2022-01-25 VITALS — BP 144/64 | HR 97 | Ht 66.0 in

## 2022-01-25 DIAGNOSIS — S0990XA Unspecified injury of head, initial encounter: Secondary | ICD-10-CM

## 2022-01-25 DIAGNOSIS — M5441 Lumbago with sciatica, right side: Secondary | ICD-10-CM | POA: Diagnosis not present

## 2022-01-25 MED ORDER — BACLOFEN 10 MG PO TABS: 10.0000 mg | ORAL_TABLET | Freq: Three times a day (TID) | ORAL | 0 refills | Status: DC

## 2022-01-25 NOTE — Patient Instructions (Signed)
Decrease gabapentin to 100 mg three times a day.  Take Tylenol as needed for pain.

## 2022-01-25 NOTE — Progress Notes (Signed)
Date:  01/25/2022   Name:  Ariana Guzman   DOB:  [birth year]   MRN:  [id number]   Chief Complaint: Leg Pain (Fell x 1 week ago. Dr Luana Shu gave her Gabapentin 900 mg daily and was given prednisone and finished last Wednesday with no improvement. Had another fall yesterday that was unwitnessed. Head hit wheelchair. Pain radiates from lower back down into right leg. Dr. Luana Shu said he thinks she has nerve impingement or sciatica. Wants her to come see [place] for pain management. )  Back Pain This is a recurrent problem. The current episode started 1 to 4 weeks ago. The problem occurs constantly. The pain is present in the lumbar spine. The quality of the pain is described as burning and shooting. The pain radiates to the right thigh and right foot. The symptoms are aggravated by bending, twisting and standing. Associated symptoms include weakness (leg and back hurts to walk, move). Pertinent negatives include no chest pain, fever or headaches.  She is sleeping very poorly due to pain in the right hip and back.  The higher dose gabapentin has not helped.  The steroid taper did not help. She uses topical rubs without benefit.  She is seeing Ortho on Thursday.  Head injury - she has had 2 falls in the past three days. The first was witnessed with no head injury.  The second yesterday was not witnessed and she hit her head on the wheelchair.  No LOC.  She was able to call for help to get up.  Per her daughter in law, she has not been sleeping well despite increase in gabapentin from 100 mg tid to 300 mg tid..  She has been more somnolent appearing over the past few days, unclear if she is worse since her fall.  She denies headache, weakness or vision change.  Lab Results  Component Value Date   NA 136 09/24/2021   K 5.4 (H) 09/24/2021   CO2 23 09/24/2021   GLUCOSE 106 (H) 09/24/2021   BUN 18 09/24/2021   CREATININE 0.93 09/24/2021   CALCIUM 9.4 09/24/2021   EGFR 61 09/24/2021   GFRNONAA 63 08/14/2020    Lab Results  Component Value Date   CHOL 177 09/24/2021   HDL 56 09/24/2021   LDLCALC 104 (H) 09/24/2021   TRIG 92 09/24/2021   CHOLHDL 3.2 09/24/2021   Lab Results  Component Value Date   TSH 2.130 09/24/2021   Lab Results  Component Value Date   HGBA1C 6.7 (H) 09/24/2021   Lab Results  Component Value Date   WBC 9.2 09/24/2021   HGB 12.9 09/24/2021   HCT 38.7 09/24/2021   MCV 91 09/24/2021   PLT 297 09/24/2021   Lab Results  Component Value Date   ALT 17 09/24/2021   AST 17 09/24/2021   ALKPHOS 67 09/24/2021   BILITOT 1.2 09/24/2021   No results found for: "25OHVITD2", "25OHVITD3", "VD25OH"   Review of Systems  Constitutional:  Negative for chills, fatigue and fever.  Respiratory:  Negative for chest tightness and shortness of breath.   Cardiovascular:  Negative for chest pain and leg swelling.  Musculoskeletal:  Positive for back pain, gait problem (was using cane, now has to use wheelchair) and myalgias.  Neurological:  Positive for weakness (leg and back hurts to walk, move). Negative for dizziness, light-headedness and headaches.  Psychiatric/Behavioral:  Positive for confusion, decreased concentration and sleep disturbance. Negative for hallucinations.     Patient Active Problem List  Diagnosis Date Noted   Slow transit constipation 12/25/2020   Bursitis of hip 06/05/2020   Acquired thrombophilia ([place]) 06/05/2020   Hyponatremia 04/05/2020   Environmental and seasonal allergies 04/03/2020   Pre-diabetes 05/29/2018   Primary insomnia 09/15/2017   Generalized anxiety disorder 09/15/2017   Essential hypertension 09/09/2017   Varicose veins of both lower extremities with pain 09/09/2017   Bilateral lower extremity edema 09/09/2017   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/03/2015   Otitis externa, chronic 12/03/2015   Obstructive sleep apnea 10/07/2015   Asthma, persistent controlled 09/25/2015   Acquired hypothyroidism 02/29/2012    Chronic atrial fibrillation ([place]) 02/29/2012   Hyperlipidemia 02/29/2012    Allergies  Allergen Reactions   Tramadol Shortness Of Breath   Meloxicam Rash    Past Surgical History:  Procedure Laterality Date   ABDOMINAL HYSTERECTOMY  1991   uterus only- still has cervix - done for prolapse   CATARACT EXTRACTION W/PHACO Left 11/24/2020   Procedure: CATARACT EXTRACTION PHACO AND INTRAOCULAR LENS PLACEMENT (IOC) LEFT 6.99 00:46.1;  Surgeon: Eulogio Bear, MD;  Location: [facility];  Service: Ophthalmology;  Laterality: Left;   CATARACT EXTRACTION W/PHACO Right 12/08/2020   Procedure: CATARACT EXTRACTION PHACO AND INTRAOCULAR LENS PLACEMENT (IOC) RIGHT 5.13 00:34.2;  Surgeon: Eulogio Bear, MD;  Location: [facility];  Service: Ophthalmology;  Laterality: Right;   COLONOSCOPY  2009   normal   TOE SURGERY      Social History   Tobacco Use   Smoking status: Former    Packs/day: 0.50    Years: 29.00    Total pack years: 14.50    Types: Cigarettes    Quit date: 09/28/1989    Years since quitting: 32.3   Smokeless tobacco: Never   Tobacco comments:    smoking cessation materials not required  Vaping Use   Vaping Use: Never used  Substance Use Topics   Alcohol use: No   Drug use: No     Medication list has been reviewed and updated.  Current Meds  Medication Sig   albuterol (PROVENTIL) (2.5 MG/3ML) 0.083% nebulizer solution Take 2.5 mg by nebulization every 6 (six) hours as needed.   ALPRAZolam (XANAX) 0.25 MG tablet TAKE 1 TABLET BY MOUTH AT BEDTIME   Ascorbic Acid (VITAMIN C) 1000 MG tablet Take 1,000 mg by mouth daily.   baclofen (LIORESAL) 10 MG tablet Take 1 tablet (10 mg total) by mouth 3 (three) times daily.   Calcium Carb-Cholecalciferol [phone] MG-UNIT TABS Take by mouth.   cetirizine (ZYRTEC) 10 MG tablet Take 10 mg by mouth 2 (two) times daily.   cholecalciferol (VITAMIN D) 1000 units tablet Take 1,000 Units by mouth daily.    clotrimazole-betamethasone (LOTRISONE) cream Apply 1 application topically 2 (two) times daily. To rash under breast   Fluticasone-Salmeterol (ADVAIR) 250-50 MCG/DOSE AEPB Inhale 1 puff into the lungs 2 (two) times daily.   gabapentin (NEURONTIN) 300 MG capsule Take 300 mg by mouth in the morning, at noon, and at bedtime.   ipratropium (ATROVENT) 0.06 % nasal spray Place 2 sprays into both nostrils 4 (four) times daily.   latanoprost (XALATAN) 0.005 % ophthalmic solution 1 drop at bedtime.   levothyroxine (SYNTHROID) 75 MCG tablet TAKE 1 TABLET BY MOUTH ONCE DAILY BEFORE BREAKFAST   losartan (COZAAR) 100 MG tablet Take 1 tablet by mouth once daily   Magnesium 100 MG CAPS Take by mouth.   MELATONIN PO Take by mouth.   metoprolol tartrate (LOPRESSOR) 25 MG  tablet TAKE 1 TABLET BY MOUTH IN THE MORNING AND AT NOON AND AT BEDTIME - TAKE 1 TABLET THREE TIMES DAILY   montelukast (SINGULAIR) 10 MG tablet Take 10 mg by mouth daily.   Multiple Vitamins-Minerals (OCUVITE EYE HEATLH GUMMIES PO) Take by mouth daily.   Omega-3 Fatty Acids (FISH OIL PO) Take by mouth.   predniSONE (STERAPRED UNI-PAK 21 TAB) 10 MG (21) TBPK tablet Take 6 tablets on day 1, 5 tablets day 2, 4 tablets day 3, 3 tablets day 4, 2 tablets day 5, 1 tablet day 6   promethazine-dextromethorphan (PROMETHAZINE-DM) 6.25-15 MG/5ML syrup Take 5 mLs by mouth 4 (four) times daily as needed.   Rivaroxaban (XARELTO) 15 MG TABS tablet Take 15 mg by mouth daily with supper.    spironolactone (ALDACTONE) 25 MG tablet Take 1 tablet by mouth twice daily       09/24/2021   10:46 AM 09/09/2021   11:19 AM 06/17/2021    2:02 PM 06/02/2021   11:36 AM  GAD 7 : Generalized Anxiety Score  Nervous, Anxious, on Edge 0 0 0 0  Control/stop worrying 1 1 0 0  Worry too much - different things 1 0 0 0  Trouble relaxing 0 0 0 0  Restless 0 0 0 0  Easily annoyed or irritable 0 0 0 0  Afraid - awful might happen 0 0 0 0  Total GAD 7 Score 2 1 0 0  Anxiety  Difficulty Not difficult at all Not difficult at all Not difficult at all Not difficult at all       09/24/2021   10:46 AM  Depression screen PHQ 2/9  Decreased Interest 0  Down, Depressed, Hopeless 0  PHQ - 2 Score 0  Altered sleeping 0  Tired, decreased energy 3  Change in appetite 0  Feeling bad or failure about yourself  0  Trouble concentrating 0  Moving slowly or fidgety/restless 0  Suicidal thoughts 0  PHQ-9 Score 3  Difficult doing work/chores Not difficult at all    BP Readings from Last 3 Encounters:  01/25/22 (!) 144/64  12/29/21 (!) 163/103  12/25/21 (!) 161/92    Physical Exam Vitals and nursing note reviewed.  Constitutional:      General: She is awake. She is not in acute distress.    Appearance: She is well-developed. She is not ill-appearing.     Comments: Sleeping appearing - dosed off in wheelchair while waiting for CT  HENT:     Head: Normocephalic and atraumatic. No raccoon eyes or contusion.  Eyes:     Extraocular Movements: Extraocular movements intact.     Conjunctiva/sclera: Conjunctivae normal.     Pupils: Pupils are equal, round, and reactive to light.  Cardiovascular:     Rate and Rhythm: Normal rate. Rhythm irregular.  Pulmonary:     Effort: Pulmonary effort is normal. No respiratory distress.     Breath sounds: No wheezing or rhonchi.  Musculoskeletal:     Cervical back: Normal range of motion.     Lumbar back: Tenderness present. No spasms or bony tenderness. Positive right straight leg raise test. Negative left straight leg raise test.     Right hip: No tenderness. Normal range of motion.     Left hip: No tenderness. Normal range of motion.     Right knee: No effusion.     Left knee: No effusion.     Right lower leg: No edema.     Left lower leg: No  edema.  Lymphadenopathy:     Cervical: No cervical adenopathy.  Skin:    General: Skin is warm and dry.     Findings: No rash.  Neurological:     Mental Status: She is oriented to  person, place, and time.     Cranial Nerves: No facial asymmetry.     Sensory: Sensation is intact.     Motor: Motor function is intact.     Comments: Gait not tested due to pain  Psychiatric:        Mood and Affect: Mood normal.        Behavior: Behavior normal. Behavior is cooperative.     Wt Readings from Last 3 Encounters:  12/29/21 199 lb 15.3 oz (90.7 kg)  12/25/21 200 lb (90.7 kg)  09/24/21 214 lb (97.1 kg)    BP (!) 144/64   Pulse 97   Ht 5' 6"  (1.676 m)   SpO2 96%   BMI 32.27 kg/m   Assessment and Plan:3 1. Closed head injury, initial encounter Concern for head trauma contributing to lethargy and confusion She is on NOAC therapy for Afib - CT HEAD WO CONTRAST (5MM) - no intra-cranial hemorrhage or mass  2. Right-sided low back pain with right-sided sciatica, unspecified chronicity Likely over sedated on gabapentin. Reduce dose to 100 mg tid. Add baclofen tid and use heat or ice to low back. Will not repeat steroid taper; no imaging due to lack of red flag signs - defer imaging to Ortho in three days - baclofen (LIORESAL) 10 MG tablet; Take 1 tablet (10 mg total) by mouth 3 (three) times daily.  Dispense: 30 each; Refill: 0   Partially dictated using Editor, commissioning. Any errors are unintentional.  Halina Maidens, MD [facility]  01/25/2022

## 2022-01-26 DIAGNOSIS — I482 Chronic atrial fibrillation, unspecified: Secondary | ICD-10-CM | POA: Diagnosis not present

## 2022-01-26 DIAGNOSIS — R0609 Other forms of dyspnea: Secondary | ICD-10-CM | POA: Diagnosis not present

## 2022-01-28 DIAGNOSIS — M4807 Spinal stenosis, lumbosacral region: Secondary | ICD-10-CM | POA: Diagnosis not present

## 2022-01-28 DIAGNOSIS — M5416 Radiculopathy, lumbar region: Secondary | ICD-10-CM | POA: Diagnosis not present

## 2022-01-28 DIAGNOSIS — M5441 Lumbago with sciatica, right side: Secondary | ICD-10-CM | POA: Diagnosis not present

## 2022-01-29 ENCOUNTER — Other Ambulatory Visit: Payer: Self-pay | Admitting: Orthopedic Surgery

## 2022-01-29 DIAGNOSIS — M5441 Lumbago with sciatica, right side: Secondary | ICD-10-CM

## 2022-02-02 DIAGNOSIS — H40003 Preglaucoma, unspecified, bilateral: Secondary | ICD-10-CM | POA: Diagnosis not present

## 2022-02-03 ENCOUNTER — Other Ambulatory Visit: Payer: Self-pay | Admitting: Orthopedic Surgery

## 2022-02-04 ENCOUNTER — Other Ambulatory Visit: Payer: Self-pay | Admitting: Orthopedic Surgery

## 2022-02-04 DIAGNOSIS — M5441 Lumbago with sciatica, right side: Secondary | ICD-10-CM

## 2022-02-08 ENCOUNTER — Encounter: Payer: Self-pay | Admitting: Internal Medicine

## 2022-02-09 ENCOUNTER — Other Ambulatory Visit: Payer: Self-pay | Admitting: Internal Medicine

## 2022-02-09 DIAGNOSIS — M5441 Lumbago with sciatica, right side: Secondary | ICD-10-CM

## 2022-02-09 MED ORDER — BACLOFEN 10 MG PO TABS: 10.0000 mg | ORAL_TABLET | Freq: Three times a day (TID) | ORAL | 0 refills | Status: DC

## 2022-02-11 ENCOUNTER — Ambulatory Visit (INDEPENDENT_AMBULATORY_CARE_PROVIDER_SITE_OTHER): Payer: Medicare Other | Admitting: Internal Medicine

## 2022-02-11 ENCOUNTER — Encounter: Payer: Self-pay | Admitting: Internal Medicine

## 2022-02-11 VITALS — BP 138/68 | HR 68 | Ht 66.0 in | Wt 211.4 lb

## 2022-02-11 DIAGNOSIS — I10 Essential (primary) hypertension: Secondary | ICD-10-CM

## 2022-02-11 DIAGNOSIS — I872 Venous insufficiency (chronic) (peripheral): Secondary | ICD-10-CM | POA: Insufficient documentation

## 2022-02-11 DIAGNOSIS — R600 Localized edema: Secondary | ICD-10-CM

## 2022-02-11 DIAGNOSIS — E039 Hypothyroidism, unspecified: Secondary | ICD-10-CM | POA: Diagnosis not present

## 2022-02-11 DIAGNOSIS — R7303 Prediabetes: Secondary | ICD-10-CM | POA: Diagnosis not present

## 2022-02-11 MED ORDER — DOXYCYCLINE HYCLATE 100 MG PO TABS: 100.0000 mg | ORAL_TABLET | Freq: Two times a day (BID) | ORAL | 0 refills | Status: AC

## 2022-02-11 NOTE — Progress Notes (Signed)
Date:  02/11/2022   Name:  Ariana Guzman   DOB:  [birth year]   MRN:  [id number]   Chief Complaint: Edema (Been going on for over a year. Not getting any better on Spironolactone. )  Hypertension This is a chronic problem. The problem is controlled. Pertinent negatives include no chest pain, palpitations or shortness of breath. Past treatments include beta blockers, diuretics and angiotensin blockers.  Diabetes She presents for her follow-up diabetic visit. She has type 2 (previously pre-diabetic) diabetes mellitus. Pertinent negatives for hypoglycemia include no dizziness or nervousness/anxiousness. Pertinent negatives for diabetes include no chest pain and no fatigue. Current diabetic treatment includes diet. She is compliant with treatment most of the time.  Edema with blisters - several weeks ago her legs were very swollen.  Since then her niece has been having her wear her compression stockings, drink more water and elevate her legs.  She is also now taking Spironolactone.  She developed some blisters and became concerned.  She denies pain or itching, no drainage or bleeding.  Since taking the above measures, her legs are much improved.   Lab Results  Component Value Date   NA 136 09/24/2021   K 5.4 (H) 09/24/2021   CO2 23 09/24/2021   GLUCOSE 106 (H) 09/24/2021   BUN 18 09/24/2021   CREATININE 0.93 09/24/2021   CALCIUM 9.4 09/24/2021   EGFR 61 09/24/2021   GFRNONAA 63 08/14/2020   Lab Results  Component Value Date   CHOL 177 09/24/2021   HDL 56 09/24/2021   LDLCALC 104 (H) 09/24/2021   TRIG 92 09/24/2021   CHOLHDL 3.2 09/24/2021   Lab Results  Component Value Date   TSH 2.130 09/24/2021   Lab Results  Component Value Date   HGBA1C 6.7 (H) 09/24/2021   Lab Results  Component Value Date   WBC 9.2 09/24/2021   HGB 12.9 09/24/2021   HCT 38.7 09/24/2021   MCV 91 09/24/2021   PLT 297 09/24/2021   Lab Results  Component Value Date   ALT 17 09/24/2021   AST 17  09/24/2021   ALKPHOS 67 09/24/2021   BILITOT 1.2 09/24/2021   No results found for: "25OHVITD2", "25OHVITD3", "VD25OH"   Review of Systems  Constitutional:  Negative for chills, fatigue and fever.  Respiratory:  Negative for chest tightness and shortness of breath.   Cardiovascular:  Positive for leg swelling. Negative for chest pain and palpitations.  Gastrointestinal:  Negative for abdominal pain and constipation.  Neurological:  Negative for dizziness and light-headedness.  Psychiatric/Behavioral:  Negative for dysphoric mood and sleep disturbance. The patient is not nervous/anxious.     Patient Active Problem List   Diagnosis Date Noted   Slow transit constipation 12/25/2020   Bursitis of hip 06/05/2020   Acquired thrombophilia ([place]) 06/05/2020   Hyponatremia 04/05/2020   Environmental and seasonal allergies 04/03/2020   Pre-diabetes 05/29/2018   Primary insomnia 09/15/2017   Generalized anxiety disorder 09/15/2017   Essential hypertension 09/09/2017   Varicose veins of both lower extremities with pain 09/09/2017   Bilateral lower extremity edema 09/09/2017   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/03/2015   Otitis externa, chronic 12/03/2015   Obstructive sleep apnea 10/07/2015   Asthma, persistent controlled 09/25/2015   Acquired hypothyroidism 02/29/2012   Chronic atrial fibrillation ([place]) 02/29/2012   Hyperlipidemia 02/29/2012    Allergies  Allergen Reactions   Tramadol Shortness Of Breath   Meloxicam Rash    Past Surgical History:  Procedure Laterality Date  ABDOMINAL HYSTERECTOMY  1991   uterus only- still has cervix - done for prolapse   CATARACT EXTRACTION W/PHACO Left 11/24/2020   Procedure: CATARACT EXTRACTION PHACO AND INTRAOCULAR LENS PLACEMENT (IOC) LEFT 6.99 00:46.1;  Surgeon: Eulogio Bear, MD;  Location: [facility];  Service: Ophthalmology;  Laterality: Left;   CATARACT EXTRACTION W/PHACO Right 12/08/2020   Procedure:  CATARACT EXTRACTION PHACO AND INTRAOCULAR LENS PLACEMENT (IOC) RIGHT 5.13 00:34.2;  Surgeon: Eulogio Bear, MD;  Location: [facility];  Service: Ophthalmology;  Laterality: Right;   COLONOSCOPY  2009   normal   TOE SURGERY      Social History   Tobacco Use   Smoking status: Former    Packs/day: 0.50    Years: 29.00    Total pack years: 14.50    Types: Cigarettes    Quit date: 09/28/1989    Years since quitting: 32.3   Smokeless tobacco: Never   Tobacco comments:    smoking cessation materials not required  Vaping Use   Vaping Use: Never used  Substance Use Topics   Alcohol use: No   Drug use: No     Medication list has been reviewed and updated.  Current Meds  Medication Sig   albuterol (PROVENTIL) (2.5 MG/3ML) 0.083% nebulizer solution Take 2.5 mg by nebulization every 6 (six) hours as needed.   ALPRAZolam (XANAX) 0.25 MG tablet TAKE 1 TABLET BY MOUTH AT BEDTIME   Ascorbic Acid (VITAMIN C) 1000 MG tablet Take 1,000 mg by mouth daily.   baclofen (LIORESAL) 10 MG tablet Take 1 tablet (10 mg total) by mouth 3 (three) times daily.   Calcium Carb-Cholecalciferol [phone] MG-UNIT TABS Take by mouth.   cetirizine (ZYRTEC) 10 MG tablet Take 10 mg by mouth 2 (two) times daily.   cholecalciferol (VITAMIN D) 1000 units tablet Take 1,000 Units by mouth daily.   clotrimazole-betamethasone (LOTRISONE) cream Apply 1 application topically 2 (two) times daily. To rash under breast   doxycycline (VIBRA-TABS) 100 MG tablet Take 1 tablet (100 mg total) by mouth 2 (two) times daily for 10 days.   Fluticasone-Salmeterol (ADVAIR) 250-50 MCG/DOSE AEPB Inhale 1 puff into the lungs 2 (two) times daily.   gabapentin (NEURONTIN) 100 MG capsule Take 100 mg by mouth in the morning, at noon, and at bedtime.   ipratropium (ATROVENT) 0.06 % nasal spray Place 2 sprays into both nostrils 4 (four) times daily.   latanoprost (XALATAN) 0.005 % ophthalmic solution 1 drop at bedtime.    levothyroxine (SYNTHROID) 75 MCG tablet TAKE 1 TABLET BY MOUTH ONCE DAILY BEFORE BREAKFAST   losartan (COZAAR) 100 MG tablet Take 1 tablet by mouth once daily   Magnesium 100 MG CAPS Take by mouth.   MELATONIN PO Take by mouth.   metoprolol tartrate (LOPRESSOR) 25 MG tablet TAKE 1 TABLET BY MOUTH IN THE MORNING AND AT NOON AND AT BEDTIME - TAKE 1 TABLET THREE TIMES DAILY   montelukast (SINGULAIR) 10 MG tablet Take 10 mg by mouth daily.   Multiple Vitamins-Minerals (OCUVITE EYE HEATLH GUMMIES PO) Take by mouth daily.   Omega-3 Fatty Acids (FISH OIL PO) Take by mouth.   Rivaroxaban (XARELTO) 15 MG TABS tablet Take 15 mg by mouth daily with supper.    spironolactone (ALDACTONE) 25 MG tablet Take 1 tablet by mouth twice daily       02/11/2022    9:21 AM 09/24/2021   10:46 AM 09/09/2021   11:19 AM 06/17/2021    2:02 PM  GAD  7 : Generalized Anxiety Score  Nervous, Anxious, on Edge 3 0 0 0  Control/stop worrying 2 1 1  0  Worry too much - different things 2 1 0 0  Trouble relaxing 1 0 0 0  Restless 2 0 0 0  Easily annoyed or irritable 1 0 0 0  Afraid - awful might happen 0 0 0 0  Total GAD 7 Score 11 2 1  0  Anxiety Difficulty Very difficult Not difficult at all Not difficult at all Not difficult at all       02/11/2022    9:21 AM 09/24/2021   10:46 AM 09/09/2021   11:18 AM  Depression screen PHQ 2/9  Decreased Interest 0 0 0  Down, Depressed, Hopeless 1 0 0  PHQ - 2 Score 1 0 0  Altered sleeping 3 0 0  Tired, decreased energy 3 3 3   Change in appetite 2 0 0  Feeling bad or failure about yourself  1 0 0  Trouble concentrating 0 0 0  Moving slowly or fidgety/restless 0 0 0  Suicidal thoughts 0 0 0  PHQ-9 Score 10 3 3   Difficult doing work/chores Very difficult Not difficult at all Not difficult at all    BP Readings from Last 3 Encounters:  02/11/22 138/68  01/25/22 (!) 144/64  12/29/21 (!) 163/103    Physical Exam Vitals and nursing note reviewed.  Constitutional:       General: She is not in acute distress.    Appearance: Normal appearance. She is well-developed.  HENT:     Head: Normocephalic and atraumatic.  Cardiovascular:     Rate and Rhythm: Normal rate and regular rhythm.     Heart sounds: No murmur heard. Pulmonary:     Effort: Pulmonary effort is normal. No respiratory distress.     Breath sounds: No wheezing or rhonchi.  Musculoskeletal:     Cervical back: Normal range of motion.     Right lower leg: 1+ Edema present.     Left lower leg: 1+ Edema present.  Lymphadenopathy:     Cervical: No cervical adenopathy.  Skin:    General: Skin is warm and dry.     Capillary Refill: Capillary refill takes less than 2 seconds.     Findings: No rash.          Comments: Fluid filled bullae, soft and non tender.  Mild erythema of surrounding skin.  Pulses palpable.  Neurological:     General: No focal deficit present.     Mental Status: She is alert and oriented to person, place, and time.  Psychiatric:        Mood and Affect: Mood normal.        Behavior: Behavior normal.     Wt Readings from Last 3 Encounters:  02/11/22 211 lb 6.4 oz (95.9 kg)  12/29/21 199 lb 15.3 oz (90.7 kg)  12/25/21 200 lb (90.7 kg)    BP 138/68 (BP Location: Left Arm, Cuff Size: Large)   Pulse 68   Ht 5' 6"  (1.676 m)   Wt 211 lb 6.4 oz (95.9 kg)   SpO2 97%   BMI 34.12 kg/m   Assessment and Plan: 1. Venous stasis dermatitis of both lower extremities Minimal symptoms other than vesicle formation Will treat presumptively for infection Recommend otc cortisone cream as needed for itching Continue elevation and compression stockings - doxycycline (VIBRA-TABS) 100 MG tablet; Take 1 tablet (100 mg total) by mouth 2 (two) times daily for 10 days.  Dispense: 20 tablet; Refill: 0  2. Bilateral lower extremity edema Continue compression stockings daily Elevate several times per day  3. Pre-diabetes Check labs Limit carbs - esp bread and pasta - Microalbumin /  creatinine urine ratio - Hemoglobin A1c - Comprehensive metabolic panel  4. Acquired hypothyroidism Supplemented - check to ensure stability - TSH+T4F+T3Free  5. Essential hypertension Clinically stable exam with well controlled BP. Tolerating medications without side effects at this time. Pt to continue current regimen and low sodium diet   Partially dictated using Editor, commissioning. Any errors are unintentional.  Halina Maidens, MD [facility]  02/11/2022

## 2022-02-12 LAB — COMPREHENSIVE METABOLIC PANEL WITH GFR
ALT: 15 [IU]/L (ref 0–32)
AST: 15 [IU]/L (ref 0–40)
Albumin/Globulin Ratio: 2 (ref 1.2–2.2)
Albumin: 4 g/dL (ref 3.6–4.6)
Alkaline Phosphatase: 63 [IU]/L (ref 44–121)
BUN/Creatinine Ratio: 19 (ref 12–28)
BUN: 14 mg/dL (ref 8–27)
Bilirubin Total: 1.2 mg/dL (ref 0.0–1.2)
CO2: 25 mmol/L (ref 20–29)
Calcium: 9.5 mg/dL (ref 8.7–10.3)
Chloride: 91 mmol/L — ABNORMAL LOW (ref 96–106)
Creatinine, Ser: 0.75 mg/dL (ref 0.57–1.00)
Globulin, Total: 2 g/dL (ref 1.5–4.5)
Glucose: 114 mg/dL — ABNORMAL HIGH (ref 70–99)
Potassium: 4.8 mmol/L (ref 3.5–5.2)
Sodium: 128 mmol/L — ABNORMAL LOW (ref 134–144)
Total Protein: 6 g/dL (ref 6.0–8.5)
eGFR: 79 mL/min/{1.73_m2}

## 2022-02-12 LAB — MICROALBUMIN / CREATININE URINE RATIO
Creatinine, Urine: 79.8 mg/dL
Microalb/Creat Ratio: 25 mg/g creat (ref 0–29)
Microalbumin, Urine: 20.2 ug/mL

## 2022-02-12 LAB — TSH+T4F+T3FREE
Free T4: 1.68 ng/dL (ref 0.82–1.77)
T3, Free: 3 pg/mL (ref 2.0–4.4)
TSH: 2.89 u[IU]/mL (ref 0.450–4.500)

## 2022-02-12 LAB — HEMOGLOBIN A1C
Est. average glucose Bld gHb Est-mCnc: 143 mg/dL
Hgb A1c MFr Bld: 6.6 % — ABNORMAL HIGH (ref 4.8–5.6)

## 2022-02-13 ENCOUNTER — Ambulatory Visit
Admission: RE | Admit: 2022-02-13 | Discharge: 2022-02-13 | Disposition: A | Discharge status: Home / Self Care | Payer: Medicare Other | Source: Ambulatory Visit | Attending: Orthopedic Surgery | Admitting: Orthopedic Surgery

## 2022-02-13 DIAGNOSIS — M5441 Lumbago with sciatica, right side: Secondary | ICD-10-CM | POA: Diagnosis not present

## 2022-02-13 DIAGNOSIS — M48061 Spinal stenosis, lumbar region without neurogenic claudication: Secondary | ICD-10-CM | POA: Diagnosis not present

## 2022-02-25 DIAGNOSIS — M25551 Pain in right hip: Secondary | ICD-10-CM | POA: Diagnosis not present

## 2022-02-25 DIAGNOSIS — M5441 Lumbago with sciatica, right side: Secondary | ICD-10-CM | POA: Diagnosis not present

## 2022-02-25 DIAGNOSIS — M48062 Spinal stenosis, lumbar region with neurogenic claudication: Secondary | ICD-10-CM | POA: Diagnosis not present

## 2022-02-25 DIAGNOSIS — G8929 Other chronic pain: Secondary | ICD-10-CM | POA: Diagnosis not present

## 2022-03-02 DIAGNOSIS — G4733 Obstructive sleep apnea (adult) (pediatric): Secondary | ICD-10-CM | POA: Diagnosis not present

## 2022-03-02 DIAGNOSIS — I10 Essential (primary) hypertension: Secondary | ICD-10-CM | POA: Diagnosis not present

## 2022-03-02 DIAGNOSIS — Z7951 Long term (current) use of inhaled steroids: Secondary | ICD-10-CM | POA: Diagnosis not present

## 2022-03-02 DIAGNOSIS — Z792 Long term (current) use of antibiotics: Secondary | ICD-10-CM | POA: Diagnosis not present

## 2022-03-02 DIAGNOSIS — E785 Hyperlipidemia, unspecified: Secondary | ICD-10-CM | POA: Diagnosis not present

## 2022-03-02 DIAGNOSIS — Z87891 Personal history of nicotine dependence: Secondary | ICD-10-CM | POA: Diagnosis not present

## 2022-03-02 DIAGNOSIS — J4530 Mild persistent asthma, uncomplicated: Secondary | ICD-10-CM | POA: Diagnosis not present

## 2022-03-02 DIAGNOSIS — I480 Paroxysmal atrial fibrillation: Secondary | ICD-10-CM | POA: Diagnosis not present

## 2022-03-02 DIAGNOSIS — Z993 Dependence on wheelchair: Secondary | ICD-10-CM | POA: Diagnosis not present

## 2022-03-02 DIAGNOSIS — E039 Hypothyroidism, unspecified: Secondary | ICD-10-CM | POA: Diagnosis not present

## 2022-03-02 DIAGNOSIS — Z9842 Cataract extraction status, left eye: Secondary | ICD-10-CM | POA: Diagnosis not present

## 2022-03-02 DIAGNOSIS — M48062 Spinal stenosis, lumbar region with neurogenic claudication: Secondary | ICD-10-CM | POA: Diagnosis not present

## 2022-03-02 DIAGNOSIS — Z9181 History of falling: Secondary | ICD-10-CM | POA: Diagnosis not present

## 2022-03-02 DIAGNOSIS — F5101 Primary insomnia: Secondary | ICD-10-CM | POA: Diagnosis not present

## 2022-03-02 DIAGNOSIS — Z7901 Long term (current) use of anticoagulants: Secondary | ICD-10-CM | POA: Diagnosis not present

## 2022-03-02 DIAGNOSIS — Z9841 Cataract extraction status, right eye: Secondary | ICD-10-CM | POA: Diagnosis not present

## 2022-03-08 DIAGNOSIS — J4530 Mild persistent asthma, uncomplicated: Secondary | ICD-10-CM | POA: Diagnosis not present

## 2022-03-08 DIAGNOSIS — Z9841 Cataract extraction status, right eye: Secondary | ICD-10-CM | POA: Diagnosis not present

## 2022-03-08 DIAGNOSIS — M48062 Spinal stenosis, lumbar region with neurogenic claudication: Secondary | ICD-10-CM | POA: Diagnosis not present

## 2022-03-08 DIAGNOSIS — G4733 Obstructive sleep apnea (adult) (pediatric): Secondary | ICD-10-CM | POA: Diagnosis not present

## 2022-03-08 DIAGNOSIS — Z7901 Long term (current) use of anticoagulants: Secondary | ICD-10-CM | POA: Diagnosis not present

## 2022-03-08 DIAGNOSIS — E039 Hypothyroidism, unspecified: Secondary | ICD-10-CM | POA: Diagnosis not present

## 2022-03-08 DIAGNOSIS — E785 Hyperlipidemia, unspecified: Secondary | ICD-10-CM | POA: Diagnosis not present

## 2022-03-08 DIAGNOSIS — F5101 Primary insomnia: Secondary | ICD-10-CM | POA: Diagnosis not present

## 2022-03-08 DIAGNOSIS — Z87891 Personal history of nicotine dependence: Secondary | ICD-10-CM | POA: Diagnosis not present

## 2022-03-08 DIAGNOSIS — Z792 Long term (current) use of antibiotics: Secondary | ICD-10-CM | POA: Diagnosis not present

## 2022-03-08 DIAGNOSIS — Z993 Dependence on wheelchair: Secondary | ICD-10-CM | POA: Diagnosis not present

## 2022-03-08 DIAGNOSIS — I10 Essential (primary) hypertension: Secondary | ICD-10-CM | POA: Diagnosis not present

## 2022-03-08 DIAGNOSIS — Z9842 Cataract extraction status, left eye: Secondary | ICD-10-CM | POA: Diagnosis not present

## 2022-03-08 DIAGNOSIS — Z9181 History of falling: Secondary | ICD-10-CM | POA: Diagnosis not present

## 2022-03-08 DIAGNOSIS — Z7951 Long term (current) use of inhaled steroids: Secondary | ICD-10-CM | POA: Diagnosis not present

## 2022-03-08 DIAGNOSIS — I480 Paroxysmal atrial fibrillation: Secondary | ICD-10-CM | POA: Diagnosis not present

## 2022-03-12 ENCOUNTER — Other Ambulatory Visit: Payer: Self-pay | Admitting: Internal Medicine

## 2022-03-12 DIAGNOSIS — F5101 Primary insomnia: Secondary | ICD-10-CM

## 2022-03-12 NOTE — Telephone Encounter (Signed)
Please advise 

## 2022-03-12 NOTE — Telephone Encounter (Signed)
Requested medication (s) are due for refill today: yes  Requested medication (s) are on the active medication list: yes  Last refill:  12/16/21 #30 2 RF  Future visit scheduled: yes  Notes to clinic:  med not delegated to NT to RF   Requested Prescriptions  Pending Prescriptions Disp Refills   ALPRAZolam (XANAX) 0.25 MG tablet [Pharmacy Med Name: ALPRAZolam 0.25 MG Oral Tablet] 30 tablet 0    Sig: TAKE 1 TABLET BY MOUTH AT BEDTIME     Not Delegated - Psychiatry: Anxiolytics/Hypnotics 2 Failed - 03/12/2022  9:12 AM      Failed - This refill cannot be delegated      Failed - Urine Drug Screen completed in last 360 days      Passed - Patient is not pregnant      Passed - Valid encounter within last 6 months    Recent Outpatient Visits           4 weeks ago Venous stasis dermatitis of both lower extremities   [facility] Glean Hess, MD   1 month ago Closed head injury, initial encounter   [facility] Glean Hess, MD   5 months ago Annual physical exam   [facility] Glean Hess, MD   6 months ago COVID-19 virus infection   [facility] Glean Hess, MD   8 months ago Acute exacerbation of COPD with asthma [facility])   [place], Laura H, MD       Future Appointments             In 3 months [facility] Melia Jesse Sans, MD [facility], [facility]

## 2022-03-16 DIAGNOSIS — J45998 Other asthma: Secondary | ICD-10-CM | POA: Diagnosis not present

## 2022-03-16 DIAGNOSIS — G4733 Obstructive sleep apnea (adult) (pediatric): Secondary | ICD-10-CM | POA: Diagnosis not present

## 2022-03-18 DIAGNOSIS — M48062 Spinal stenosis, lumbar region with neurogenic claudication: Secondary | ICD-10-CM | POA: Diagnosis not present

## 2022-03-19 DIAGNOSIS — Z87891 Personal history of nicotine dependence: Secondary | ICD-10-CM | POA: Diagnosis not present

## 2022-03-19 DIAGNOSIS — Z9841 Cataract extraction status, right eye: Secondary | ICD-10-CM | POA: Diagnosis not present

## 2022-03-19 DIAGNOSIS — F5101 Primary insomnia: Secondary | ICD-10-CM | POA: Diagnosis not present

## 2022-03-19 DIAGNOSIS — G4733 Obstructive sleep apnea (adult) (pediatric): Secondary | ICD-10-CM | POA: Diagnosis not present

## 2022-03-19 DIAGNOSIS — E039 Hypothyroidism, unspecified: Secondary | ICD-10-CM | POA: Diagnosis not present

## 2022-03-19 DIAGNOSIS — E785 Hyperlipidemia, unspecified: Secondary | ICD-10-CM | POA: Diagnosis not present

## 2022-03-19 DIAGNOSIS — J4530 Mild persistent asthma, uncomplicated: Secondary | ICD-10-CM | POA: Diagnosis not present

## 2022-03-19 DIAGNOSIS — M48062 Spinal stenosis, lumbar region with neurogenic claudication: Secondary | ICD-10-CM | POA: Diagnosis not present

## 2022-03-19 DIAGNOSIS — Z9181 History of falling: Secondary | ICD-10-CM | POA: Diagnosis not present

## 2022-03-19 DIAGNOSIS — Z7951 Long term (current) use of inhaled steroids: Secondary | ICD-10-CM | POA: Diagnosis not present

## 2022-03-19 DIAGNOSIS — Z993 Dependence on wheelchair: Secondary | ICD-10-CM | POA: Diagnosis not present

## 2022-03-19 DIAGNOSIS — I480 Paroxysmal atrial fibrillation: Secondary | ICD-10-CM | POA: Diagnosis not present

## 2022-03-19 DIAGNOSIS — Z7901 Long term (current) use of anticoagulants: Secondary | ICD-10-CM | POA: Diagnosis not present

## 2022-03-19 DIAGNOSIS — Z9842 Cataract extraction status, left eye: Secondary | ICD-10-CM | POA: Diagnosis not present

## 2022-03-19 DIAGNOSIS — Z792 Long term (current) use of antibiotics: Secondary | ICD-10-CM | POA: Diagnosis not present

## 2022-03-19 DIAGNOSIS — I10 Essential (primary) hypertension: Secondary | ICD-10-CM | POA: Diagnosis not present

## 2022-03-22 ENCOUNTER — Other Ambulatory Visit: Payer: Self-pay | Admitting: Internal Medicine

## 2022-03-22 DIAGNOSIS — I10 Essential (primary) hypertension: Secondary | ICD-10-CM

## 2022-03-23 NOTE — Telephone Encounter (Signed)
Requested Prescriptions  Pending Prescriptions Disp Refills  . losartan (COZAAR) 100 MG tablet [Pharmacy Med Name: Losartan Potassium 100 MG Oral Tablet] 90 tablet 0    Sig: Take 1 tablet by mouth once daily     Cardiovascular:  Angiotensin Receptor Blockers Passed - 03/22/2022  9:10 AM      Passed - Cr in normal range and within 180 days    Creatinine, Ser  Date Value Ref Range Status  02/11/2022 0.75 0.57 - 1.00 mg/dL Final         Passed - K in normal range and within 180 days    Potassium  Date Value Ref Range Status  02/11/2022 4.8 3.5 - 5.2 mmol/L Final         Passed - Patient is not pregnant      Passed - Last BP in normal range    BP Readings from Last 1 Encounters:  02/11/22 138/68         Passed - Valid encounter within last 6 months    Recent Outpatient Visits          1 month ago Venous stasis dermatitis of both lower extremities   [facility] Glean Hess, MD   1 month ago Closed head injury, initial encounter   [facility] Glean Hess, MD   6 months ago Annual physical exam   [facility] Glean Hess, MD   6 months ago COVID-19 virus infection   [facility] Glean Hess, MD   9 months ago Acute exacerbation of COPD with asthma [facility])   [place], Laura H, MD      Future Appointments            In 2 months Army Melia Jesse Sans, MD [facility], [place]           . spironolactone (ALDACTONE) 25 MG tablet [Pharmacy Med Name: Spironolactone 25 MG Oral Tablet] 180 tablet 0    Sig: Take 1 tablet by mouth twice daily     Cardiovascular: Diuretics - Aldosterone Antagonist Failed - 03/22/2022  9:10 AM      Failed - Na in normal range and within 180 days    Sodium  Date Value Ref Range Status  02/11/2022 128 (L) 134 - 144 mmol/L Final         Passed - Cr in normal range and within 180 days    Creatinine, Ser  Date Value Ref Range Status  02/11/2022 0.75 0.57 - 1.00 mg/dL  Final         Passed - K in normal range and within 180 days    Potassium  Date Value Ref Range Status  02/11/2022 4.8 3.5 - 5.2 mmol/L Final         Passed - eGFR is 30 or above and within 180 days    GFR calc Af Amer  Date Value Ref Range Status  08/14/2020 72 >59 mL/min/1.73 Final    Comment:    **In accordance with recommendations from the NKF-ASN Task force,**   [facility] is in the process of updating its eGFR calculation to the   2021 CKD-EPI creatinine equation that estimates kidney function   without a race variable.    GFR, Estimated  Date Value Ref Range Status  06/05/2020 >60 >60 mL/min Final    Comment:    (NOTE) Calculated using the CKD-EPI Creatinine Equation (2021)    GFR calc non Af Wyvonnia Lora  Date  Value Ref Range Status  08/14/2020 63 >59 mL/min/1.73 Final   eGFR  Date Value Ref Range Status  02/11/2022 79 >59 mL/min/1.73 Final         Passed - Last BP in normal range    BP Readings from Last 1 Encounters:  02/11/22 138/68         Passed - Valid encounter within last 6 months    Recent Outpatient Visits          1 month ago Venous stasis dermatitis of both lower extremities   [facility] Glean Hess, MD   1 month ago Closed head injury, initial encounter   [facility] Glean Hess, MD   6 months ago Annual physical exam   [facility] Glean Hess, MD   6 months ago COVID-19 virus infection   [facility] Glean Hess, MD   9 months ago Acute exacerbation of COPD with asthma [facility])   [facility] Glean Hess, MD      Future Appointments            In 2 months Army Melia Jesse Sans, MD [facility], [facility]

## 2022-03-30 DIAGNOSIS — I10 Essential (primary) hypertension: Secondary | ICD-10-CM | POA: Diagnosis not present

## 2022-03-30 DIAGNOSIS — Z7951 Long term (current) use of inhaled steroids: Secondary | ICD-10-CM | POA: Diagnosis not present

## 2022-03-30 DIAGNOSIS — Z9841 Cataract extraction status, right eye: Secondary | ICD-10-CM | POA: Diagnosis not present

## 2022-03-30 DIAGNOSIS — Z87891 Personal history of nicotine dependence: Secondary | ICD-10-CM | POA: Diagnosis not present

## 2022-03-30 DIAGNOSIS — E039 Hypothyroidism, unspecified: Secondary | ICD-10-CM | POA: Diagnosis not present

## 2022-03-30 DIAGNOSIS — G4733 Obstructive sleep apnea (adult) (pediatric): Secondary | ICD-10-CM | POA: Diagnosis not present

## 2022-03-30 DIAGNOSIS — E785 Hyperlipidemia, unspecified: Secondary | ICD-10-CM | POA: Diagnosis not present

## 2022-03-30 DIAGNOSIS — Z7901 Long term (current) use of anticoagulants: Secondary | ICD-10-CM | POA: Diagnosis not present

## 2022-03-30 DIAGNOSIS — M48062 Spinal stenosis, lumbar region with neurogenic claudication: Secondary | ICD-10-CM | POA: Diagnosis not present

## 2022-03-30 DIAGNOSIS — J4530 Mild persistent asthma, uncomplicated: Secondary | ICD-10-CM | POA: Diagnosis not present

## 2022-03-30 DIAGNOSIS — Z9181 History of falling: Secondary | ICD-10-CM | POA: Diagnosis not present

## 2022-03-30 DIAGNOSIS — Z9842 Cataract extraction status, left eye: Secondary | ICD-10-CM | POA: Diagnosis not present

## 2022-03-30 DIAGNOSIS — F5101 Primary insomnia: Secondary | ICD-10-CM | POA: Diagnosis not present

## 2022-03-30 DIAGNOSIS — Z792 Long term (current) use of antibiotics: Secondary | ICD-10-CM | POA: Diagnosis not present

## 2022-03-30 DIAGNOSIS — I480 Paroxysmal atrial fibrillation: Secondary | ICD-10-CM | POA: Diagnosis not present

## 2022-03-30 DIAGNOSIS — Z993 Dependence on wheelchair: Secondary | ICD-10-CM | POA: Diagnosis not present

## 2022-04-01 DIAGNOSIS — M48062 Spinal stenosis, lumbar region with neurogenic claudication: Secondary | ICD-10-CM | POA: Diagnosis not present

## 2022-04-01 DIAGNOSIS — M25551 Pain in right hip: Secondary | ICD-10-CM | POA: Diagnosis not present

## 2022-04-01 DIAGNOSIS — M5441 Lumbago with sciatica, right side: Secondary | ICD-10-CM | POA: Diagnosis not present

## 2022-04-01 DIAGNOSIS — G8929 Other chronic pain: Secondary | ICD-10-CM | POA: Diagnosis not present

## 2022-04-01 DIAGNOSIS — M1611 Unilateral primary osteoarthritis, right hip: Secondary | ICD-10-CM | POA: Diagnosis not present

## 2022-04-06 DIAGNOSIS — Z7951 Long term (current) use of inhaled steroids: Secondary | ICD-10-CM | POA: Diagnosis not present

## 2022-04-06 DIAGNOSIS — J4530 Mild persistent asthma, uncomplicated: Secondary | ICD-10-CM | POA: Diagnosis not present

## 2022-04-06 DIAGNOSIS — I480 Paroxysmal atrial fibrillation: Secondary | ICD-10-CM | POA: Diagnosis not present

## 2022-04-06 DIAGNOSIS — M48062 Spinal stenosis, lumbar region with neurogenic claudication: Secondary | ICD-10-CM | POA: Diagnosis not present

## 2022-04-06 DIAGNOSIS — G4733 Obstructive sleep apnea (adult) (pediatric): Secondary | ICD-10-CM | POA: Diagnosis not present

## 2022-04-06 DIAGNOSIS — Z87891 Personal history of nicotine dependence: Secondary | ICD-10-CM | POA: Diagnosis not present

## 2022-04-06 DIAGNOSIS — Z792 Long term (current) use of antibiotics: Secondary | ICD-10-CM | POA: Diagnosis not present

## 2022-04-06 DIAGNOSIS — Z9842 Cataract extraction status, left eye: Secondary | ICD-10-CM | POA: Diagnosis not present

## 2022-04-06 DIAGNOSIS — I10 Essential (primary) hypertension: Secondary | ICD-10-CM | POA: Diagnosis not present

## 2022-04-06 DIAGNOSIS — Z9181 History of falling: Secondary | ICD-10-CM | POA: Diagnosis not present

## 2022-04-06 DIAGNOSIS — Z9841 Cataract extraction status, right eye: Secondary | ICD-10-CM | POA: Diagnosis not present

## 2022-04-06 DIAGNOSIS — E039 Hypothyroidism, unspecified: Secondary | ICD-10-CM | POA: Diagnosis not present

## 2022-04-06 DIAGNOSIS — Z7901 Long term (current) use of anticoagulants: Secondary | ICD-10-CM | POA: Diagnosis not present

## 2022-04-06 DIAGNOSIS — F5101 Primary insomnia: Secondary | ICD-10-CM | POA: Diagnosis not present

## 2022-04-06 DIAGNOSIS — E785 Hyperlipidemia, unspecified: Secondary | ICD-10-CM | POA: Diagnosis not present

## 2022-04-06 DIAGNOSIS — Z993 Dependence on wheelchair: Secondary | ICD-10-CM | POA: Diagnosis not present

## 2022-04-12 DIAGNOSIS — M25551 Pain in right hip: Secondary | ICD-10-CM | POA: Diagnosis not present

## 2022-04-12 DIAGNOSIS — G8929 Other chronic pain: Secondary | ICD-10-CM | POA: Diagnosis not present

## 2022-04-14 DIAGNOSIS — I10 Essential (primary) hypertension: Secondary | ICD-10-CM | POA: Diagnosis not present

## 2022-04-14 DIAGNOSIS — Z9181 History of falling: Secondary | ICD-10-CM | POA: Diagnosis not present

## 2022-04-14 DIAGNOSIS — G4733 Obstructive sleep apnea (adult) (pediatric): Secondary | ICD-10-CM | POA: Diagnosis not present

## 2022-04-14 DIAGNOSIS — Z7951 Long term (current) use of inhaled steroids: Secondary | ICD-10-CM | POA: Diagnosis not present

## 2022-04-14 DIAGNOSIS — Z7901 Long term (current) use of anticoagulants: Secondary | ICD-10-CM | POA: Diagnosis not present

## 2022-04-14 DIAGNOSIS — Z792 Long term (current) use of antibiotics: Secondary | ICD-10-CM | POA: Diagnosis not present

## 2022-04-14 DIAGNOSIS — J4530 Mild persistent asthma, uncomplicated: Secondary | ICD-10-CM | POA: Diagnosis not present

## 2022-04-14 DIAGNOSIS — F5101 Primary insomnia: Secondary | ICD-10-CM | POA: Diagnosis not present

## 2022-04-14 DIAGNOSIS — I480 Paroxysmal atrial fibrillation: Secondary | ICD-10-CM | POA: Diagnosis not present

## 2022-04-14 DIAGNOSIS — E785 Hyperlipidemia, unspecified: Secondary | ICD-10-CM | POA: Diagnosis not present

## 2022-04-14 DIAGNOSIS — Z9841 Cataract extraction status, right eye: Secondary | ICD-10-CM | POA: Diagnosis not present

## 2022-04-14 DIAGNOSIS — Z9842 Cataract extraction status, left eye: Secondary | ICD-10-CM | POA: Diagnosis not present

## 2022-04-14 DIAGNOSIS — M48062 Spinal stenosis, lumbar region with neurogenic claudication: Secondary | ICD-10-CM | POA: Diagnosis not present

## 2022-04-14 DIAGNOSIS — E039 Hypothyroidism, unspecified: Secondary | ICD-10-CM | POA: Diagnosis not present

## 2022-04-14 DIAGNOSIS — Z87891 Personal history of nicotine dependence: Secondary | ICD-10-CM | POA: Diagnosis not present

## 2022-04-14 DIAGNOSIS — Z993 Dependence on wheelchair: Secondary | ICD-10-CM | POA: Diagnosis not present

## 2022-04-15 DIAGNOSIS — G4733 Obstructive sleep apnea (adult) (pediatric): Secondary | ICD-10-CM | POA: Diagnosis not present

## 2022-04-26 DIAGNOSIS — M5441 Lumbago with sciatica, right side: Secondary | ICD-10-CM | POA: Diagnosis not present

## 2022-04-26 DIAGNOSIS — G8929 Other chronic pain: Secondary | ICD-10-CM | POA: Diagnosis not present

## 2022-04-26 DIAGNOSIS — M48062 Spinal stenosis, lumbar region with neurogenic claudication: Secondary | ICD-10-CM | POA: Diagnosis not present

## 2022-04-26 DIAGNOSIS — M25551 Pain in right hip: Secondary | ICD-10-CM | POA: Diagnosis not present

## 2022-05-15 DIAGNOSIS — G4733 Obstructive sleep apnea (adult) (pediatric): Secondary | ICD-10-CM | POA: Diagnosis not present

## 2022-05-17 ENCOUNTER — Ambulatory Visit: Payer: Medicare Other

## 2022-05-21 ENCOUNTER — Ambulatory Visit (INDEPENDENT_AMBULATORY_CARE_PROVIDER_SITE_OTHER): Payer: Medicare Other

## 2022-05-21 ENCOUNTER — Telehealth: Payer: Self-pay | Admitting: Internal Medicine

## 2022-05-21 DIAGNOSIS — Z0000 Encounter for general adult medical examination without abnormal findings: Secondary | ICD-10-CM | POA: Diagnosis not present

## 2022-05-21 NOTE — Telephone Encounter (Signed)
Copied from [place] [phone]. Topic: General - Inquiry >> May 21, 2022  2:00 PM Marcellus Scott wrote: Reason for CRM: Pt calling in stated she would like for me to ask PCP if she will be doing blood work at upcoming appointment on 06/11/2022. If so pt is requesting an earlier appointment.   Pt is requesting a call back.  Please advise. >> May 21, 2022  2:13 PM Marcellus Scott wrote: Pt mentioned prefers Friday appointments.

## 2022-05-21 NOTE — Patient Instructions (Signed)

## 2022-05-21 NOTE — Progress Notes (Signed)
I connected with  Ariana Guzman on 05/21/22 by a audio enabled telemedicine application and verified that I am speaking with the correct person using two identifiers.  Patient Location: Home  Provider Location: Office/Clinic  I discussed the limitations of evaluation and management by telemedicine. The patient expressed understanding and agreed to proceed.   Subjective:   Ariana Guzman is a 81 y.o. female who presents for Medicare Annual (Subsequent) preventive examination.  Review of Systems    Per HPI unless specifically indicated below.  Cardiac Risk Factors include: advanced age (>45mn, >>68women);female gender,essential hypertension, pre-diabetes, and hyperlipidemia.         Objective:       02/11/2022    1:00 PM 02/11/2022    9:10 AM 01/25/2022    2:10 PM  Vitals with BMI  Height  '5\' 6"'$  '5\' 6"'$   Weight  211 lbs 6 oz   BMI  349.67  Systolic [id number] Diastolic 68 72 64  Pulse  68 97    There were no vitals filed for this visit. There is no height or weight on file to calculate BMI.     05/21/2022   11:22 AM 12/29/2021   12:48 PM 12/25/2021   12:10 PM 05/13/2021   11:46 AM 12/08/2020    8:16 AM 11/24/2020   10:38 AM 05/12/2020   11:58 AM  Advanced Directives  Does Patient Have a Medical Advance Directive? No No No No No No No  Does patient want to make changes to medical advance directive?     No - Patient declined No - Patient declined   Would patient like information on creating a medical advance directive? No - Patient declined   No - Patient declined No - Patient declined  No - Patient declined    Current Medications (verified) Outpatient Encounter Medications as of 05/21/2022  Medication Sig   albuterol (PROVENTIL) (2.5 MG/3ML) 0.083% nebulizer solution Take 2.5 mg by nebulization every 6 (six) hours as needed.   ALPRAZolam (XANAX) 0.25 MG tablet TAKE 1 TABLET BY MOUTH AT BEDTIME   Ascorbic Acid (VITAMIN C) 1000 MG tablet Take 1,000 mg by mouth daily.    baclofen (LIORESAL) 10 MG tablet Take 1 tablet (10 mg total) by mouth 3 (three) times daily.   Calcium Carb-Cholecalciferol [phone] MG-UNIT TABS Take by mouth.   cetirizine (ZYRTEC) 10 MG tablet Take 10 mg by mouth 2 (two) times daily.   cholecalciferol (VITAMIN D) 1000 units tablet Take 1,000 Units by mouth daily.   Fluticasone-Umeclidin-Vilant (TRELEGY ELLIPTA) 200-62.5-25 MCG/ACT AEPB Inhale into the lungs.   gabapentin (NEURONTIN) 100 MG capsule Take 100 mg by mouth in the morning, at noon, and at bedtime.   ipratropium (ATROVENT) 0.06 % nasal spray Place 2 sprays into both nostrils 4 (four) times daily.   latanoprost (XALATAN) 0.005 % ophthalmic solution 1 drop at bedtime.   levothyroxine (SYNTHROID) 75 MCG tablet TAKE 1 TABLET BY MOUTH ONCE DAILY BEFORE BREAKFAST   losartan (COZAAR) 100 MG tablet Take 1 tablet by mouth once daily   Magnesium 100 MG CAPS Take by mouth.   MELATONIN PO Take by mouth.   metoprolol tartrate (LOPRESSOR) 25 MG tablet TAKE 1 TABLET BY MOUTH IN THE MORNING AND AT NOON AND AT BEDTIME - TAKE 1 TABLET THREE TIMES DAILY   Multiple Vitamins-Minerals (OCUVITE EYE HEATLH GUMMIES PO) Take by mouth daily.   Omega-3 Fatty Acids (FISH OIL PO) Take by mouth.   Rivaroxaban (XARELTO) 15  MG TABS tablet Take 15 mg by mouth daily with supper.    celecoxib (CELEBREX) 200 MG capsule Take 200 mg by mouth daily.   clotrimazole-betamethasone (LOTRISONE) cream Apply 1 application topically 2 (two) times daily. To rash under breast (Patient not taking: Reported on 05/21/2022)   Fluticasone-Salmeterol (ADVAIR) 250-50 MCG/DOSE AEPB Inhale 1 puff into the lungs 2 (two) times daily. (Patient not taking: Reported on 05/21/2022)   montelukast (SINGULAIR) 10 MG tablet Take 10 mg by mouth daily. (Patient not taking: Reported on 05/21/2022)   spironolactone (ALDACTONE) 25 MG tablet Take 1 tablet by mouth twice daily (Patient not taking: Reported on 05/21/2022)   [DISCONTINUED] fexofenadine (ALLEGRA)  180 MG tablet Take 180 mg by mouth daily.   No facility-administered encounter medications on file as of 05/21/2022.    Allergies (verified) Tramadol and Meloxicam   History: Past Medical History:  Diagnosis Date   Allergies    Arthritis    Asthma    Atrial fibrillation ([place])    Hypertension    Peripheral arterial disease ([place])    Sleep apnea    CPAP   Past Surgical History:  Procedure Laterality Date   ABDOMINAL HYSTERECTOMY  1991   uterus only- still has cervix - done for prolapse   CATARACT EXTRACTION W/PHACO Left 11/24/2020   Procedure: CATARACT EXTRACTION PHACO AND INTRAOCULAR LENS PLACEMENT (IOC) LEFT 6.99 00:46.1;  Surgeon: Eulogio Bear, MD;  Location: [place];  Service: Ophthalmology;  Laterality: Left;   CATARACT EXTRACTION W/PHACO Right 12/08/2020   Procedure: CATARACT EXTRACTION PHACO AND INTRAOCULAR LENS PLACEMENT (IOC) RIGHT 5.13 00:34.2;  Surgeon: Eulogio Bear, MD;  Location: [place];  Service: Ophthalmology;  Laterality: Right;   COLONOSCOPY  2009   normal   TOE SURGERY     Family History  Problem Relation Age of Onset   Breast cancer Other 78   Dementia Mother    Cancer Father        prostate and bone   Heart attack Sister    Social History   Socioeconomic History   Marital status: Widowed    Spouse name: Not on file   Number of children: 2   Years of education: Not on file   Highest education level: 12th grade  Occupational History   Occupation: retired  Tobacco Use   Smoking status: Former    Packs/day: 0.50    Years: 29.00    Total pack years: 14.50    Types: Cigarettes    Quit date: 09/28/1989    Years since quitting: 32.6   Smokeless tobacco: Never   Tobacco comments:    smoking cessation materials not required  Vaping Use   Vaping Use: Never used  Substance and Sexual Activity   Alcohol use: No   Drug use: No   Sexual activity: Not Currently  Other Topics Concern   Not on file  Social History  Narrative   Pt lives alone   Social Determinants of Health   Financial Resource Strain: Low Risk  (05/21/2022)   Overall Financial Resource Strain (CARDIA)    Difficulty of Paying Living Expenses: Not hard at all  Food Insecurity: No Food Insecurity (05/21/2022)   Hunger Vital Sign    Worried About Running Out of Food in the Last Year: Never true    [place] in the Last Year: Never true  Transportation Needs: No Transportation Needs (05/21/2022)   PRAPARE - Hydrologist (Medical): No  Lack of Transportation (Non-Medical): No  Physical Activity: Inactive (05/13/2021)   Exercise Vital Sign    Days of Exercise per Week: 0 days    Minutes of Exercise per Session: 0 min  Stress: No Stress Concern Present (05/21/2022)   [place]    Feeling of Stress : Not at all  Social Connections: Moderately Integrated (05/21/2022)   Social Connection and Isolation Panel [NHANES]    Frequency of Communication with Friends and Family: More than three times a week    Frequency of Social Gatherings with Friends and Family: More than three times a week    Attends Religious Services: More than 4 times per year    Active Member of [employer] or Organizations: Yes    Attends Archivist Meetings: More than 4 times per year    Marital Status: Widowed    Tobacco Counseling Counseling given: No Tobacco comments: smoking cessation materials not required   Clinical Intake:  Pre-visit preparation completed: No  Pain : No/denies pain     Nutritional Status: BMI 25 -29 Overweight Nutritional Risks: None Diabetes: No  How often do you need to have someone help you when you read instructions, pamphlets, or other written materials from your doctor or pharmacy?: 1 - Never  Diabetic?no  Interpreter Needed?: No  Information entered by :: Donnie Mesa, [place]   Activities of Daily Living     05/21/2022   11:40 AM 09/24/2021   10:47 AM  In your present state of health, do you have any difficulty performing the following activities:  Hearing? 1 1  Vision? 1 0  Difficulty concentrating or making decisions? 0 0  Walking or climbing stairs? 1 1  Dressing or bathing? 0 0  Doing errands, shopping? 0 1    Patient Care Team: Glean Hess, MD as PCP - General (Internal Medicine) Idamae Schuller, Laban Emperor, MD as Consulting Physician (Pulmonary Disease) Edwyna Ready, MD as Consulting Physician (Cardiology) Dr Jerrilyn Cairo as Consulting Physician (Sleep Medicine) Eulogio Bear, MD as Consulting Physician (Ophthalmology) Lucky Cowboy, Erskine Squibb, MD as Referring Physician (Vascular Surgery)  Indicate any recent Medical Services you may have received from other than [facility] providers in the past year (date may be approximate).    The pt was seen at [facility] on 5/16.2023 for Persistent asthma, wheezing and coughing.  Assessment:   This is a routine wellness examination for [facility]  Hearing/Vision screen Admits mild hearing loss. Denies any vision issues since cataract surgery, annual eye exam. Dietary issues and exercise activities discussed: Current Exercise Habits: Structured exercise class, Time (Minutes): 15, Frequency (Times/Week): 3, Weekly Exercise (Minutes/Week): 45, Intensity: Mild, Exercise limited by: orthopedic condition(s)   Goals Addressed   None    Depression Screen    05/21/2022   11:37 AM 02/11/2022    9:21 AM 09/24/2021   10:46 AM 09/09/2021   11:18 AM 06/17/2021    2:02 PM 06/02/2021   11:35 AM 05/13/2021   11:42 AM  PHQ 2/9 Scores  PHQ - 2 Score 0 1 0 0 0 0 0  PHQ- 9 Score  '10 3 3 '$ 0 0     Fall Risk    05/21/2022   11:37 AM 02/11/2022    9:21 AM 09/24/2021   10:47 AM 09/09/2021   11:19 AM 06/17/2021    2:02 PM  Fall Risk   Falls in the past year? 0 1 0 0 0  Number falls in past  yr: 0 0 0 0 0  Injury with Fall? 0 0 0 0 0  Risk for fall due to : No  Fall Risks No Fall Risks No Fall Risks History of fall(s);Impaired balance/gait No Fall Risks  Follow up Falls evaluation completed  Falls evaluation completed Falls evaluation completed Falls evaluation completed    [place]:  Any stairs in or around the home? No  If so, are there any without handrails?  No stairs  Home free of loose throw rugs in walkways, pet beds, electrical cords, etc? Yes  Adequate lighting in your home to reduce risk of falls? Yes   ASSISTIVE DEVICES UTILIZED TO PREVENT FALLS:  Life alert? No  Use of a cane, walker or w/c? Yes  Grab bars in the bathroom? Yes  Shower chair or bench in shower? No  Elevated toilet seat or a handicapped toilet? Yes   TIMED UP AND GO:  Was the test performed?  unable to assess, virtual appt  .    Cognitive Function:        05/21/2022   11:42 AM 05/09/2019   11:52 AM 04/24/2018    2:11 PM  6CIT Screen  What Year? 0 points 0 points 0 points  What month? 0 points 0 points 0 points  What time? 0 points 0 points 3 points  Count back from 20 0 points 0 points 0 points  Months in reverse 0 points 0 points 0 points  Repeat phrase 6 points 0 points 0 points  Total Score 6 points 0 points 3 points    Immunizations Immunization History  Administered Date(s) Administered   Fluad Quad(high Dose 65+) 05/28/2019, 05/08/2020   Influenza, High Dose Seasonal PF 08/11/2017, 04/24/2018   PFIZER(Purple Top)SARS-COV-2 Vaccination 10/08/2019, 10/29/2019, 07/17/2020, 05/16/2021   Pneumococcal Conjugate-13 04/24/2018   Pneumococcal Polysaccharide-23 05/28/2019   Tdap 02/26/2015    TDAP status: Up to date  Flu Vaccine status: Due, Education has been provided regarding the importance of this vaccine. Advised may receive this vaccine at local pharmacy or Health Dept. Aware to provide a copy of the vaccination record if obtained from local pharmacy or Health Dept. Verbalized acceptance and  understanding.  Pneumococcal vaccine status: Up to date  Covid-19 vaccine status: Information provided on how to obtain vaccines.   Qualifies for Shingles Vaccine? Yes   Zostavax completed No   Shingrix Completed?: No.    Education has been provided regarding the importance of this vaccine. Patient has been advised to call insurance company to determine out of pocket expense if they have not yet received this vaccine. Advised may also receive vaccine at local pharmacy or Health Dept. Verbalized acceptance and understanding.  Screening Tests Health Maintenance  Topic Date Due   Zoster Vaccines- Shingrix (1 of 2) Never done   MAMMOGRAM  08/03/2019   COVID-19 Vaccine (5 - Pfizer risk series) 07/11/2021   INFLUENZA VACCINE  03/16/2022   DEXA SCAN  05/22/2023 (Originally 08/08/2004)   TETANUS/TDAP  02/25/2025   Pneumonia Vaccine 32+ Years old  Completed   HPV VACCINES  Aged Out    Health Maintenance  Health Maintenance Due  Topic Date Due   Zoster Vaccines- Shingrix (1 of 2) Never done   MAMMOGRAM  08/03/2019   COVID-19 Vaccine (5 - Pfizer risk series) 07/11/2021   INFLUENZA VACCINE  03/16/2022    Colorectal cancer screening: No longer required.   Mammogram status: No longer required due to age.  Dexa Scan: overdue,  postponed  Lung Cancer Screening: (Low Dose CT Chest recommended if Age 55-80 years, 30 pack-year currently smoking OR have quit w/in 15years.) does not qualify.   Additional Screening:  Hepatitis C Screening: does not qualify  Vision Screening: Recommended annual ophthalmology exams for early detection of glaucoma and other disorders of the eye. Is the patient up to date with their annual eye exam?  Yes  Who is the provider or what is the name of the office in which the patient attends annual eye exams? Dr. Edison Pace If pt is not established with a provider, would they like to be referred to a provider to establish care? No .   Dental Screening: Recommended annual  dental exams for proper oral hygiene  Community Resource Referral / Chronic Care Management: CRR required this visit?  No   CCM required this visit?  No      Plan:     I have personally reviewed and noted the following in the patient's chart:   Medical and social history Use of alcohol, tobacco or illicit drugs  Current medications and supplements including opioid prescriptions. Patient is not currently taking opioid prescriptions. Functional ability and status Nutritional status Physical activity Advanced directives List of other physicians Hospitalizations, surgeries, and ER visits in previous 12 months Vitals Screenings to include cognitive, depression, and falls Referrals and appointments  In addition, I have reviewed and discussed with patient certain preventive protocols, quality metrics, and best practice recommendations. A written personalized care plan for preventive services as well as general preventive health recommendations were provided to patient.    Ms. Alcindor , Thank you for taking time to come for your Medicare Wellness Visit. I appreciate your ongoing commitment to your health goals. Please review the following plan we discussed and let me know if I can assist you in the future.   These are the goals we discussed:  Goals      DIET - INCREASE WATER INTAKE     Recommend to drink at least 6-8 8oz glasses of water per day.        This is a list of the screening recommended for you and due dates:  Health Maintenance  Topic Date Due   Zoster (Shingles) Vaccine (1 of 2) Never done   Mammogram  08/03/2019   COVID-19 Vaccine (5 - Pfizer risk series) 07/11/2021   Flu Shot  03/16/2022   DEXA scan (bone density measurement)  05/22/2023*   Tetanus Vaccine  02/25/2025   Pneumonia Vaccine  Completed   HPV Vaccine  Aged Out  *Topic was postponed. The date shown is not the original due date.     Wilson Singer, [place]   05/21/2022   Nurse Notes: Approximately  30 minute Non-Face-to-Face Visit

## 2022-05-25 DIAGNOSIS — B351 Tinea unguium: Secondary | ICD-10-CM | POA: Diagnosis not present

## 2022-05-25 DIAGNOSIS — L851 Acquired keratosis [keratoderma] palmaris et plantaris: Secondary | ICD-10-CM | POA: Diagnosis not present

## 2022-05-25 DIAGNOSIS — I739 Peripheral vascular disease, unspecified: Secondary | ICD-10-CM | POA: Diagnosis not present

## 2022-05-25 LAB — HM DIABETES FOOT EXAM: HM Diabetic Foot Exam: NORMAL

## 2022-06-04 ENCOUNTER — Ambulatory Visit: Payer: Medicare Other | Admitting: Internal Medicine

## 2022-06-09 ENCOUNTER — Other Ambulatory Visit: Payer: Self-pay | Admitting: Internal Medicine

## 2022-06-09 DIAGNOSIS — F5101 Primary insomnia: Secondary | ICD-10-CM

## 2022-06-10 NOTE — Telephone Encounter (Signed)
Requested medication (s) are due for refill today: yes  Requested medication (s) are on the active medication list: yes  Last refill:  03/12/22  Future visit scheduled: yes  Notes to clinic:  Unable to refill per protocol, cannot delegate.      Requested Prescriptions  Pending Prescriptions Disp Refills   ALPRAZolam (XANAX) 0.25 MG tablet [Pharmacy Med Name: ALPRAZolam 0.25 MG Oral Tablet] 30 tablet 0    Sig: TAKE 1 TABLET BY MOUTH AT BEDTIME     Not Delegated - Psychiatry: Anxiolytics/Hypnotics 2 Failed - 06/09/2022  9:29 AM      Failed - This refill cannot be delegated      Failed - Urine Drug Screen completed in last 360 days      Passed - Patient is not pregnant      Passed - Valid encounter within last 6 months    Recent Outpatient Visits           3 months ago Venous stasis dermatitis of both lower extremities   [place] [facility] at [facility], Jesse Sans, MD   4 months ago Closed head injury, initial encounter   [facility] at [facility], Jesse Sans, MD   8 months ago Annual physical exam   [place] [facility] at [facility], Jesse Sans, MD   9 months ago COVID-19 virus infection   [facility] at [facility], Jesse Sans, MD   11 months ago Acute exacerbation of COPD with asthma [facility])   [place] [facility] at [facility], Jesse Sans, MD       Future Appointments             Tomorrow [place] Melia Jesse Sans, MD [facility] and [facility] Medicine at [facility], [facility]

## 2022-06-10 NOTE — Telephone Encounter (Signed)
Please advise 

## 2022-06-11 ENCOUNTER — Ambulatory Visit (INDEPENDENT_AMBULATORY_CARE_PROVIDER_SITE_OTHER): Payer: Medicare Other | Admitting: Internal Medicine

## 2022-06-11 ENCOUNTER — Encounter: Payer: Self-pay | Admitting: Internal Medicine

## 2022-06-11 VITALS — BP 122/76 | HR 56 | Ht 66.0 in | Wt 203.0 lb

## 2022-06-11 DIAGNOSIS — R7303 Prediabetes: Secondary | ICD-10-CM | POA: Diagnosis not present

## 2022-06-11 DIAGNOSIS — Z23 Encounter for immunization: Secondary | ICD-10-CM

## 2022-06-11 DIAGNOSIS — F5101 Primary insomnia: Secondary | ICD-10-CM | POA: Diagnosis not present

## 2022-06-11 DIAGNOSIS — I10 Essential (primary) hypertension: Secondary | ICD-10-CM | POA: Diagnosis not present

## 2022-06-11 DIAGNOSIS — M48061 Spinal stenosis, lumbar region without neurogenic claudication: Secondary | ICD-10-CM

## 2022-06-11 DIAGNOSIS — M1712 Unilateral primary osteoarthritis, left knee: Secondary | ICD-10-CM

## 2022-06-11 LAB — POCT GLYCOSYLATED HEMOGLOBIN (HGB A1C): Hemoglobin A1C: 6.4 % — AB (ref 4.0–5.6)

## 2022-06-11 MED ORDER — SPIRONOLACTONE 25 MG PO TABS: 25.0000 mg | ORAL_TABLET | Freq: Two times a day (BID) | ORAL | 1 refills | Status: DC

## 2022-06-11 MED ORDER — ALPRAZOLAM 0.25 MG PO TABS: 0.2500 mg | ORAL_TABLET | Freq: Every day | ORAL | 2 refills | Status: DC

## 2022-06-11 MED ORDER — METOPROLOL TARTRATE 25 MG PO TABS: ORAL_TABLET | ORAL | 1 refills | Status: DC

## 2022-06-11 MED ORDER — LOSARTAN POTASSIUM 100 MG PO TABS: 100.0000 mg | ORAL_TABLET | Freq: Every day | ORAL | 1 refills | Status: DC

## 2022-06-11 NOTE — Progress Notes (Signed)
Date:  06/11/2022   Name:  Ariana Guzman   DOB:  [birth year]   MRN:  [id number]   Chief Complaint: Hypertension and Diabetes  Hypertension This is a chronic problem. The problem is controlled. Pertinent negatives include no chest pain, headaches, palpitations or shortness of breath. Past treatments include angiotensin blockers, beta blockers and diuretics.  Diabetes She presents for her follow-up diabetic visit. She has type 2 diabetes mellitus. Pertinent negatives for hypoglycemia include no headaches, nervousness/anxiousness or tremors. Pertinent negatives for diabetes include no chest pain, no fatigue, no polydipsia and no polyuria. Symptoms are stable. Current diabetic treatment includes diet.  Knee Pain  There was no injury mechanism. The pain is present in the left knee. The quality of the pain is described as aching. The pain is moderate. The pain has been Fluctuating since onset. Pertinent negatives include no numbness. She has tried NSAIDs (and steroid injections) for the symptoms.    Lab Results  Component Value Date   NA 128 (L) 02/11/2022   K 4.8 02/11/2022   CO2 25 02/11/2022   GLUCOSE 114 (H) 02/11/2022   BUN 14 02/11/2022   CREATININE 0.75 02/11/2022   CALCIUM 9.5 02/11/2022   EGFR 79 02/11/2022   GFRNONAA 63 08/14/2020   Lab Results  Component Value Date   CHOL 177 09/24/2021   HDL 56 09/24/2021   LDLCALC 104 (H) 09/24/2021   TRIG 92 09/24/2021   CHOLHDL 3.2 09/24/2021   Lab Results  Component Value Date   TSH 2.890 02/11/2022   Lab Results  Component Value Date   HGBA1C 6.6 (H) 02/11/2022   Lab Results  Component Value Date   WBC 9.2 09/24/2021   HGB 12.9 09/24/2021   HCT 38.7 09/24/2021   MCV 91 09/24/2021   PLT 297 09/24/2021   Lab Results  Component Value Date   ALT 15 02/11/2022   AST 15 02/11/2022   ALKPHOS 63 02/11/2022   BILITOT 1.2 02/11/2022   No results found for: "25OHVITD2", "25OHVITD3", "VD25OH"   Review of Systems   Constitutional:  Negative for appetite change, fatigue, fever and unexpected weight change.  HENT:  Negative for tinnitus and trouble swallowing.   Eyes:  Negative for visual disturbance.  Respiratory:  Negative for cough, chest tightness and shortness of breath.   Cardiovascular:  Positive for leg swelling. Negative for chest pain and palpitations.  Gastrointestinal:  Negative for abdominal pain.  Endocrine: Negative for polydipsia and polyuria.  Genitourinary:  Negative for dysuria and hematuria.  Musculoskeletal:  Positive for arthralgias, back pain and gait problem.  Neurological:  Negative for tremors, numbness and headaches.  Psychiatric/Behavioral:  Positive for sleep disturbance. Negative for dysphoric mood. The patient is not nervous/anxious.     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   Otitis externa, chronic 12/03/2015   Obstructive sleep apnea 10/07/2015   Asthma, persistent controlled 09/25/2015   Acquired hypothyroidism 02/29/2012   Chronic atrial fibrillation ([place]) 02/29/2012   Hyperlipidemia  02/29/2012    Allergies  Allergen Reactions   Tramadol Shortness Of Breath   Meloxicam Rash    Past Surgical History:  Procedure Laterality Date   ABDOMINAL HYSTERECTOMY  1991   uterus only- still has cervix - done for prolapse   CATARACT EXTRACTION W/PHACO Left 11/24/2020   Procedure: CATARACT EXTRACTION  PHACO AND INTRAOCULAR LENS PLACEMENT (IOC) LEFT 6.99 00:46.1;  Surgeon: Eulogio Bear, MD;  Location: [facility];  Service: Ophthalmology;  Laterality: Left;   CATARACT EXTRACTION W/PHACO Right 12/08/2020   Procedure: CATARACT EXTRACTION PHACO AND INTRAOCULAR LENS PLACEMENT (IOC) RIGHT 5.13 00:34.2;  Surgeon: Eulogio Bear, MD;  Location: [facility];  Service: Ophthalmology;  Laterality: Right;   COLONOSCOPY  2009   normal   TOE SURGERY      Social History   Tobacco Use   Smoking status: Former    Packs/day: 0.50    Years: 29.00    Total pack years: 14.50    Types: Cigarettes    Quit date: 09/28/1989    Years since quitting: 32.7   Smokeless tobacco: Never   Tobacco comments:    smoking cessation materials not required  Vaping Use   Vaping Use: Never used  Substance Use Topics   Alcohol use: No   Drug use: No     Medication list has been reviewed and updated.  Current Meds  Medication Sig   albuterol (PROVENTIL) (2.5 MG/3ML) 0.083% nebulizer solution Take 2.5 mg by nebulization every 6 (six) hours as needed.   ALPRAZolam (XANAX) 0.25 MG tablet TAKE 1 TABLET BY MOUTH AT BEDTIME   Ascorbic Acid (VITAMIN C) 1000 MG tablet Take 1,000 mg by mouth daily.   baclofen (LIORESAL) 10 MG tablet Take 1 tablet (10 mg total) by mouth 3 (three) times daily.   Calcium Carb-Cholecalciferol [id number] MG-UNIT TABS Take by mouth.   celecoxib (CELEBREX) 200 MG capsule Take 200 mg by mouth daily.   cetirizine (ZYRTEC) 10 MG tablet Take 10 mg by mouth 2 (two) times daily.   cholecalciferol (VITAMIN D) 1000 units tablet Take 1,000 Units by mouth daily.   clotrimazole-betamethasone (LOTRISONE) cream Apply 1 application topically 2 (two) times daily. To rash under breast   Fluticasone-Umeclidin-Vilant (TRELEGY ELLIPTA) 200-62.5-25 MCG/ACT AEPB Inhale into the lungs.   gabapentin (NEURONTIN) 100 MG capsule Take 100 mg by mouth in the morning, at noon, and at bedtime.    ipratropium (ATROVENT) 0.06 % nasal spray Place 2 sprays into both nostrils 4 (four) times daily.   latanoprost (XALATAN) 0.005 % ophthalmic solution 1 drop at bedtime.   levothyroxine (SYNTHROID) 75 MCG tablet TAKE 1 TABLET BY MOUTH ONCE DAILY BEFORE BREAKFAST   losartan (COZAAR) 100 MG tablet Take 1 tablet by mouth once daily   Magnesium 100 MG CAPS Take by mouth.   MELATONIN PO Take by mouth.   metoprolol tartrate (LOPRESSOR) 25 MG tablet TAKE 1 TABLET BY MOUTH IN THE MORNING AND AT NOON AND AT BEDTIME - TAKE 1 TABLET THREE TIMES DAILY   montelukast (SINGULAIR) 10 MG tablet Take 10 mg by mouth daily.   Multiple Vitamins-Minerals (OCUVITE EYE HEATLH GUMMIES PO) Take by mouth daily.   Omega-3 Fatty Acids (FISH OIL PO) Take by mouth.   Rivaroxaban (XARELTO) 15 MG TABS tablet Take 15 mg by mouth daily with supper.    spironolactone (ALDACTONE) 25 MG tablet Take 1 tablet by mouth twice daily       02/11/2022    9:21 AM 09/24/2021  10:46 AM 09/09/2021   11:19 AM 06/17/2021    2:02 PM  GAD 7 : Generalized Anxiety Score  Nervous, Anxious, on Edge 3 0 0 0  Control/stop worrying _0 0  Worry too much - different things 2 1 0 0  Trouble relaxing 1 0 0 0  Restless 2 0 0 0  Easily annoyed or irritable 1 0 0 0  Afraid - awful might happen 0 0 0 0  Total GAD 7 Score _1 0  Anxiety Difficulty Very difficult Not difficult at all Not difficult at all Not difficult at all       05/21/2022   11:37 AM 02/11/2022    9:21 AM 09/24/2021   10:46 AM  Depression screen PHQ 2/9  Decreased Interest 0 0 0  Down, Depressed, Hopeless 0 1 0  PHQ - 2 Score 0 1 0  Altered sleeping  3 0  Tired, decreased energy  3 3  Change in appetite  2 0  Feeling bad or failure about yourself   1 0  Trouble concentrating  0 0  Moving slowly or fidgety/restless  0 0  Suicidal thoughts  0 0  PHQ-9 Score  10 3  Difficult doing work/chores  Very difficult Not difficult at all    BP Readings from Last 3 Encounters:   06/11/22 122/76  02/11/22 138/68  01/25/22 (!) 144/64    Physical Exam Vitals and nursing note reviewed.  Constitutional:      General: She is not in acute distress.    Appearance: She is well-developed.  HENT:     Head: Normocephalic and atraumatic.  Neck:     Vascular: No carotid bruit.  Cardiovascular:     Rate and Rhythm: Normal rate and regular rhythm.     Pulses: Normal pulses.     Heart sounds: No murmur heard.    Comments: TED hose on both LEs Pulmonary:     Effort: Pulmonary effort is normal. No respiratory distress.     Breath sounds: Wheezing present. No rhonchi.  Musculoskeletal:     Cervical back: Normal range of motion.     Right lower leg: No edema.     Left lower leg: No edema.  Lymphadenopathy:     Cervical: No cervical adenopathy.  Skin:    General: Skin is warm and dry.     Findings: No rash.  Neurological:     Mental Status: She is alert and oriented to person, place, and time.  Psychiatric:        Mood and Affect: Mood normal.        Behavior: Behavior normal.     Wt Readings from Last 3 Encounters:  06/11/22 203 lb (92.1 kg)  02/11/22 211 lb 6.4 oz (95.9 kg)  12/29/21 199 lb 15.3 oz (90.7 kg)    BP 122/76   Pulse (!) 56   Ht _2  (1.676 m)   Wt 203 lb (92.1 kg)   SpO2 95%   BMI 32.77 kg/m   Assessment and Plan: 1. Essential hypertension Clinically stable exam with well controlled BP. Tolerating medications without side effects at this time. Pt to continue current regimen and low sodium diet; benefits of regular exercise as able discussed. - losartan (COZAAR) 100 MG tablet; Take 1 tablet (100 mg total) by mouth daily.  Dispense: 90 tablet; Refill: 1 - metoprolol tartrate (LOPRESSOR) 25 MG tablet; TAKE 1 TABLET BY MOUTH IN THE MORNING AND AT NOON AND AT BEDTIME -  TAKE 1 TABLET THREE TIMES DAILY  Dispense: 270 tablet; Refill: 1 - spironolactone (ALDACTONE) 25 MG tablet; Take 1 tablet (25 mg total) by mouth 2 (two) times daily.   Dispense: 180 tablet; Refill: 1  2. Pre-diabetes A1C now less than 6.5 - continue diet changes - POCT glycosylated hemoglobin (Hb A1C)= 6.4  3. Primary osteoarthritis of left knee Followed by [facility]  4. Spinal stenosis of lumbar region without neurogenic claudication Not a surgical candidate but the gabapentin and Celebrex have helped  5. Primary insomnia Taking low dose Xanax at bedtime for sleep No evidence of misuse or abuse - ALPRAZolam (XANAX) 0.25 MG tablet; Take 1 tablet (0.25 mg total) by mouth at bedtime.  Dispense: 30 tablet; Refill: 2   Partially dictated using Editor, commissioning. Any errors are unintentional.  Halina Maidens, MD [facility]  06/11/2022

## 2022-06-14 ENCOUNTER — Encounter (INDEPENDENT_AMBULATORY_CARE_PROVIDER_SITE_OTHER): Payer: Self-pay

## 2022-06-15 ENCOUNTER — Ambulatory Visit: Payer: Medicare Other | Admitting: Internal Medicine

## 2022-06-15 DIAGNOSIS — G4733 Obstructive sleep apnea (adult) (pediatric): Secondary | ICD-10-CM | POA: Diagnosis not present

## 2022-06-23 NOTE — Telephone Encounter (Unsigned)
Copied from [place] [id number]. Topic: General - Other >> Jun 23, 2022  1:59 PM Cyndi Bender wrote: Reason for CRM: Pt stated she needs to speak with Dr. Gaspar Cola nurse Steffanie Dunn. Cb# [phone]

## 2022-06-25 ENCOUNTER — Encounter: Payer: Self-pay | Admitting: Family Medicine

## 2022-06-25 ENCOUNTER — Telehealth: Payer: Medicare Other | Admitting: Family Medicine

## 2022-06-25 VITALS — Ht 66.0 in | Wt 203.0 lb

## 2022-06-25 DIAGNOSIS — J0110 Acute frontal sinusitis, unspecified: Secondary | ICD-10-CM | POA: Insufficient documentation

## 2022-06-25 DIAGNOSIS — J00 Acute nasopharyngitis [common cold]: Secondary | ICD-10-CM | POA: Insufficient documentation

## 2022-06-25 MED ORDER — AZITHROMYCIN 250 MG PO TABS: ORAL_TABLET | ORAL | 0 refills | Status: AC

## 2022-06-25 NOTE — Progress Notes (Signed)
Primary Care / Sports Medicine Virtual Visit  Patient Information:  Patient ID: Ariana Guzman, female DOB: 19-Jun-1941 Age: 81 y.o. MRN: [id number]   DAMA Guzman is a pleasant 81 y.o. female presenting with the following:  Chief Complaint  Patient presents with   Sinusitis    Patient c/o cough- green/yellow production, sinus pressure, headache and drainage for 5 days. Feeling slightly better this afternoon. Patient has tried tylenol, and nebulizer and has had some relief. No loss of taste or smell, fever, or sob.     Review of Systems: No fevers, chills, night sweats, weight loss, chest pain, or shortness of breath.   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   Hyponatremia 04/05/2020   Environmental and seasonal allergies 04/03/2020   Pre-diabetes 05/29/2018   Primary insomnia 09/15/2017   Generalized anxiety disorder 09/15/2017   Essential hypertension 09/09/2017   Varicose veins of both lower extremities with pain 09/09/2017   Bilateral lower extremity edema 09/09/2017   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/03/2015   Otitis externa, chronic 12/03/2015   Obstructive sleep apnea 10/07/2015   Asthma, persistent controlled 09/25/2015   Acquired hypothyroidism 02/29/2012   Chronic atrial fibrillation ([place]) 02/29/2012   Hyperlipidemia 02/29/2012   Past Medical History:  Diagnosis Date   Allergies    Arthritis    Asthma    Atrial fibrillation ([place])    Hypertension    Peripheral arterial disease ([place])    Sleep apnea    CPAP   Outpatient Encounter Medications as of 06/25/2022   Medication Sig Note   albuterol (PROVENTIL) (2.5 MG/3ML) 0.083% nebulizer solution Take 2.5 mg by nebulization every 6 (six) hours as needed.    ALPRAZolam (XANAX) 0.25 MG tablet Take 1 tablet (0.25 mg total) by mouth at bedtime.    Ascorbic Acid (VITAMIN C) 1000 MG tablet Take 1,000 mg by mouth daily.    azithromycin (ZITHROMAX) 250 MG tablet Take 2 tablets on day 1, then 1 tablet daily on days 2 through 5    baclofen (LIORESAL) 10 MG tablet Take 1 tablet (10 mg total) by mouth 3 (three) times daily.    Calcium Carb-Cholecalciferol [phone] MG-UNIT TABS Take by mouth.    celecoxib (CELEBREX) 200 MG capsule Take 200 mg by mouth daily.    cetirizine (ZYRTEC) 10 MG tablet Take 10 mg by mouth 2 (two) times daily.    cholecalciferol (VITAMIN D) 1000 units tablet Take 1,000 Units by mouth daily. 08/21/2018: 5000   clotrimazole-betamethasone (LOTRISONE) cream Apply 1 application topically 2 (two) times daily. To rash under breast    Fluticasone-Umeclidin-Vilant (TRELEGY ELLIPTA) 200-62.5-25 MCG/ACT AEPB Inhale into the lungs.    gabapentin (NEURONTIN) 100 MG capsule Take 100 mg by mouth in the morning, at noon, and at bedtime.    ipratropium (ATROVENT) 0.06 % nasal spray Place 2 sprays into both nostrils 4 (four) times daily.    latanoprost (XALATAN) 0.005 % ophthalmic solution 1 drop at bedtime.    levothyroxine (SYNTHROID) 75 MCG tablet TAKE 1 TABLET BY MOUTH ONCE DAILY BEFORE BREAKFAST    losartan (COZAAR) 100 MG tablet Take 1 tablet (100  mg total) by mouth daily.    Magnesium 100 MG CAPS Take by mouth.    MELATONIN PO Take by mouth.    metoprolol tartrate (LOPRESSOR) 25 MG tablet TAKE 1 TABLET BY MOUTH IN THE MORNING AND AT NOON AND AT BEDTIME - TAKE 1 TABLET THREE TIMES DAILY    montelukast (SINGULAIR) 10 MG tablet Take 10 mg by mouth daily.    Multiple Vitamins-Minerals (OCUVITE EYE HEATLH GUMMIES PO) Take by mouth daily.    Omega-3 Fatty Acids (FISH OIL PO) Take by mouth.    Rivaroxaban  (XARELTO) 15 MG TABS tablet Take 15 mg by mouth daily with supper.     spironolactone (ALDACTONE) 25 MG tablet Take 1 tablet (25 mg total) by mouth 2 (two) times daily.    [DISCONTINUED] fexofenadine (ALLEGRA) 180 MG tablet Take 180 mg by mouth daily.    No facility-administered encounter medications on file as of 06/25/2022.   Past Surgical History:  Procedure Laterality Date   ABDOMINAL HYSTERECTOMY  1991   uterus only- still has cervix - done for prolapse   CATARACT EXTRACTION W/PHACO Left 11/24/2020   Procedure: CATARACT EXTRACTION PHACO AND INTRAOCULAR LENS PLACEMENT (IOC) LEFT 6.99 00:46.1;  Surgeon: Eulogio Bear, MD;  Location: [facility];  Service: Ophthalmology;  Laterality: Left;   CATARACT EXTRACTION W/PHACO Right 12/08/2020   Procedure: CATARACT EXTRACTION PHACO AND INTRAOCULAR LENS PLACEMENT (IOC) RIGHT 5.13 00:34.2;  Surgeon: Eulogio Bear, MD;  Location: [facility];  Service: Ophthalmology;  Laterality: Right;   COLONOSCOPY  2009   normal   TOE SURGERY      Virtual Visit via MyChart Video:   I connected with Ariana Guzman on 06/25/22 via MyChart Video and verified that I am speaking with the correct person using appropriate identifiers.   The limitations, risks, security and privacy concerns of performing an evaluation and management service by MyChart Video, including the higher likelihood of inaccurate diagnoses and treatments, and the availability of in person appointments were reviewed. The possible need of an additional face-to-face encounter for complete and high quality delivery of care was discussed. The patient was also made aware that there may be a patient responsible charge related to this service. The patient expressed understanding and wishes to proceed.  Provider location is in medical facility. Patient location is at their home, different from provider location. People involved in care of the patient during this telehealth encounter  were myself, my nurse/medical assistant, and my front office/scheduling team member.  Objective findings:   General: Speaking full sentences, no audible heavy breathing. Sounds alert and appropriately interactive. Well-appearing. Face symmetric. Extraocular movements intact. Pupils equal and round. No nasal flaring or accessory muscle use visualized.  Independent interpretation of notes and tests performed by another provider:   None  Pertinent History, Exam, Impression, and Recommendations:   Acute frontal sinusitis Patient with history of asthma presenting with 5-days of left facial pressure, congestion, cough productive of yellowish sputum.  She denies any shortness of air or chest pain, has been using her asthma medications diligently and performing nebulizer treatments.  Home oxygen saturation has been tracked and at 98% (her reported baseline).  She denies fevers, chills, or worsening symptoms.  She has been sleeping well and reports steady improvement, primarily noted overnight.  Given her stated symptomatology, left frontal region tenderness when asked to palpate, concern for frontal sinusitis.  That being said, given her recently noted improvement, I have encouraged continued monitoring and symptomatic treatment until  the 7-10-day mark, I have written for azithromycin to initiate if symptoms persist without improvement and to continue asthma treatments to prevent lower respiratory tract involvement.  Patient vocalizes understanding and is amenable to this plan.  Orders & Medications Meds ordered this encounter  Medications   azithromycin (ZITHROMAX) 250 MG tablet    Sig: Take 2 tablets on day 1, then 1 tablet daily on days 2 through 5    Dispense:  6 tablet    Refill:  0   No orders of the defined types were placed in this encounter.    I discussed the above assessment and treatment plan with the patient. The patient was provided an opportunity to ask questions and all were  answered. The patient agreed with the plan and demonstrated an understanding of the instructions.   The patient was advised to call back or seek an in-person evaluation if the symptoms worsen or if the condition fails to improve as anticipated.   I provided a total time of 30 minutes including both face-to-face and non-face-to-face time on 06/25/2022 inclusive of time utilized for medical chart review, information gathering, care coordination with staff, and documentation completion.    Montel Culver, MD   Primary Care Sports Medicine [place]

## 2022-06-25 NOTE — Patient Instructions (Signed)
-   Continue asthma treatments and nebulizer to keep lungs open - Continue Robitussin as needed for cough - If symptoms persist without improvement by Sunday, start antibiotics and take for full course - Contact our office for any persistent or worsening symptoms despite the above

## 2022-06-25 NOTE — Assessment & Plan Note (Signed)
Patient with history of asthma presenting with 5-days of left facial pressure, congestion, cough productive of yellowish sputum.  She denies any shortness of air or chest pain, has been using her asthma medications diligently and performing nebulizer treatments.  Home oxygen saturation has been tracked and at 98% (her reported baseline).  She denies fevers, chills, or worsening symptoms.  She has been sleeping well and reports steady improvement, primarily noted overnight.  Given her stated symptomatology, left frontal region tenderness when asked to palpate, concern for frontal sinusitis.  That being said, given her recently noted improvement, I have encouraged continued monitoring and symptomatic treatment until the 7-10-day mark, I have written for azithromycin to initiate if symptoms persist without improvement and to continue asthma treatments to prevent lower respiratory tract involvement.  Patient vocalizes understanding and is amenable to this plan.

## 2022-06-25 NOTE — Telephone Encounter (Signed)
Called pt scheduled VV.   KP

## 2022-06-25 NOTE — Telephone Encounter (Unsigned)
Copied from [place] [id number]. Topic: General - Other >> Jun 25, 2022  9:35 AM Cyndi Bender wrote: Reason for CRM: Pt stated she requested a return call but she has not heard from anyone. Pt requests that her call be returned asap. Cb# [phone]

## 2022-06-28 ENCOUNTER — Other Ambulatory Visit: Payer: Self-pay | Admitting: Family Medicine

## 2022-06-28 MED ORDER — PROMETHAZINE-DM 6.25-15 MG/5ML PO SYRP: 5.0000 mL | ORAL_SOLUTION | Freq: Four times a day (QID) | ORAL | 0 refills | Status: DC | PRN

## 2022-06-28 NOTE — Telephone Encounter (Unsigned)
Copied from [place] [phone]. Topic: General - Other >> Jun 28, 2022  1:21 PM Ja-Kwan M wrote: Reason for CRM: Pt stated she had an appt last Friday but a cough medication was not sent to her pharmacy. Pt requests that Rx for a cough medication be sent to [facility], [place] Phone: [phone]  Fax: [phone]

## 2022-07-01 NOTE — Telephone Encounter (Unsigned)
Copied from [place] [id number]. Topic: General - Other >> Jul 01, 2022 11:44 AM Leilani Able wrote: Pt states that she has congestion still and coughing yellow discharge. She wants Chastity to call her, she really wants another antibiotic, denies appt as as problem with transportation vs wheelchair.Marland Kitchen Pls fu at [phone]

## 2022-07-02 ENCOUNTER — Telehealth: Payer: Self-pay

## 2022-07-02 NOTE — Telephone Encounter (Signed)
Called and spoke with patient. Informed per Dr Army Melia that she needs to give the abx more time to work in her system. Told her per Dr Army Melia to give it another 7 days and if she does not get better then to come back and see [place].   She verbalized understanding.  - Chass  Copied from [place] [phone]. Topic: General - Other >> Jul 01, 2022 11:44 AM Leilani Able wrote: Pt states that she has congestion still and coughing yellow discharge. She wants Chastity to call her, she really wants another antibiotic, denies appt as as problem with transportation vs wheelchair.Marland Kitchen Pls fu at [phone]

## 2022-07-06 ENCOUNTER — Ambulatory Visit: Payer: Self-pay | Admitting: *Deleted

## 2022-07-06 NOTE — Telephone Encounter (Signed)
  Pt has a rash under her legs, says it is real red under her skin. She is really concerned and says that her PCP prescribed her a topical cream that relieved it. PCP does not have an appt until over a week from now and she does not want to wait any more. She says it does not itch or burn.

## 2022-07-06 NOTE — Telephone Encounter (Signed)
Patient called, left VM to return the call to the office to speak to a nurse. Unable to reach patient after 3 attempts by [facility] NT, routing to the provider for resolution per protocol.    Summary: Rash, no appts soon enough   Pt has a rash under her legs, says it is real red under her skin. She is really concerned and says that her PCP prescribed her a topical cream that relieved it. PCP does not have an appt until over a week from now and she does not want to wait any more. She says it does not itch or burn.  Best contact: [phone]

## 2022-07-07 ENCOUNTER — Ambulatory Visit: Payer: Self-pay | Admitting: *Deleted

## 2022-07-07 NOTE — Telephone Encounter (Signed)
  Pt has a rash under her legs, says it is real red under her skin. She is really concerned and says that her PCP prescribed her a topical cream that relieved it. PCP does not have an appt until over a week from now and she does not want to wait any more. She says it does not itch or burn.     Reason for Disposition  [1] Pimples (localized) AND [place].Ok ] no improvement after using Care Advice  Answer Assessment - Initial Assessment Questions 1. APPEARANCE of RASH: "Describe the rash."      Red "Patches" 2. LOCATION: "Where is the rash located?"      Both legs, above ankles 3. NUMBER: "How many spots are there?"      Multiple areas 4. SIZE: "How big are the spots?" (Inches, centimeters or compare to size of a coin)       5. ONSET: "When did the rash start?"      SUnday 6. ITCHING: "Does the rash itch?" If Yes, ask: "How bad is the itch?"  (Scale 0-10; or none, mild, moderate, severe)     no 7. PAIN: "Does the rash hurt?" If Yes, ask: "How bad is the pain?"  (Scale 0-10; or none, mild, moderate, severe)    - NONE (0): no pain    - MILD (1-3): doesn't interfere with normal activities     - MODERATE (4-7): interferes with normal activities or awakens from sleep     - SEVERE (8-10): excruciating pain, unable to do any normal activities     no 8. OTHER SYMPTOMS: "Do you have any other symptoms?" (e.g., fever)     Mild swelling of feet, "Much better."  Protocols used: Rash or Redness - Localized-A-AH

## 2022-07-07 NOTE — Telephone Encounter (Signed)
  Chief Complaint: RAsh Symptoms: Reports splotchy red areas both legs "Like she had before and a cream was ordered that helped within 3 days." Areas above ankles.States no worse than previously,"The same thing." States her daughter is a Marine scientist and said it looks like cellulitis. Mild swelling both feet "Much better I got it way down."  Frequency: Sunday Pertinent Negatives: Patient denies itching pain Disposition: '[]'$ ED /'[]'$ Urgent Care (no appt availability in office) / '[x]'$ Appointment(In office/virtual)/ '[]'$  [facility]/ '[]'$ Home Care/ '[]'$ Refused Recommended Disposition /'[]'$ [facility]/ '[]'$  Follow-up with PCP Additional Notes: Appt secured for Monday as daughters comment "Cellulitis." Advised pt NT would route to practice for PCPs review and final disposition. Requesting cream be called in tonight. Aware could not guarantee at this late hour.

## 2022-07-12 ENCOUNTER — Ambulatory Visit (INDEPENDENT_AMBULATORY_CARE_PROVIDER_SITE_OTHER): Payer: Medicare Other | Admitting: Internal Medicine

## 2022-07-12 ENCOUNTER — Encounter: Payer: Self-pay | Admitting: Internal Medicine

## 2022-07-12 VITALS — BP 132/70 | HR 58 | Resp 95 | Ht 66.0 in | Wt 206.0 lb

## 2022-07-12 DIAGNOSIS — I872 Venous insufficiency (chronic) (peripheral): Secondary | ICD-10-CM | POA: Diagnosis not present

## 2022-07-12 DIAGNOSIS — I10 Essential (primary) hypertension: Secondary | ICD-10-CM

## 2022-07-12 DIAGNOSIS — M67441 Ganglion, right hand: Secondary | ICD-10-CM | POA: Diagnosis not present

## 2022-07-12 DIAGNOSIS — I482 Chronic atrial fibrillation, unspecified: Secondary | ICD-10-CM

## 2022-07-12 NOTE — Progress Notes (Signed)
Date:  07/12/2022   Name:  Ariana Guzman   DOB:  02/12/1941   MRN:  [id number]   Chief Complaint: Rash (Rash on bilateral ankles. Getting better. Started 2 weeks ago. Swelling and redness. )  Rash This is a new problem. The current episode started 1 to 4 weeks ago. The problem has been gradually improving since onset. The affected locations include the right ankle and left ankle. Rash characteristics: discoloration of skin of both lower legs. Pertinent negatives include no fatigue, fever or shortness of breath. (Intermittent swelling from moderately severe varicose veins)  Hand Pain  There was no injury mechanism. The pain is present in the right hand (swelling over second MCP). The patient is experiencing no pain. Pertinent negatives include no chest pain.    Lab Results  Component Value Date   NA 128 (L) 02/11/2022   K 4.8 02/11/2022   CO2 25 02/11/2022   GLUCOSE 114 (H) 02/11/2022   BUN 14 02/11/2022   CREATININE 0.75 02/11/2022   CALCIUM 9.5 02/11/2022   EGFR 79 02/11/2022   GFRNONAA 63 08/14/2020   Lab Results  Component Value Date   CHOL 177 09/24/2021   HDL 56 09/24/2021   LDLCALC 104 (H) 09/24/2021   TRIG 92 09/24/2021   CHOLHDL 3.2 09/24/2021   Lab Results  Component Value Date   TSH 2.890 02/11/2022   Lab Results  Component Value Date   HGBA1C 6.4 (A) 06/11/2022   Lab Results  Component Value Date   WBC 9.2 09/24/2021   HGB 12.9 09/24/2021   HCT 38.7 09/24/2021   MCV 91 09/24/2021   PLT 297 09/24/2021   Lab Results  Component Value Date   ALT 15 02/11/2022   AST 15 02/11/2022   ALKPHOS 63 02/11/2022   BILITOT 1.2 02/11/2022   No results found for: "25OHVITD2", "25OHVITD3", "VD25OH"   Review of Systems  Constitutional:  Negative for appetite change, diaphoresis, fatigue and fever.  Respiratory:  Negative for chest tightness and shortness of breath.   Cardiovascular:  Positive for leg swelling. Negative for chest pain and palpitations.  Skin:   Positive for rash.  Neurological:  Negative for headaches.  Psychiatric/Behavioral:  Negative for sleep disturbance.     Patient Active Problem List   Diagnosis Date Noted   Acute rhinitis 06/25/2022   Acute frontal sinusitis 06/25/2022   Primary osteoarthritis of left knee 06/11/2022   Spinal stenosis of lumbar region without neurogenic claudication 06/11/2022   Venous stasis dermatitis of both lower extremities 02/11/2022   Slow transit constipation 12/25/2020   Bursitis of hip 06/05/2020   Acquired thrombophilia ([place]) 06/05/2020   Hyponatremia 04/05/2020   Environmental and seasonal allergies 04/03/2020   Pre-diabetes 05/29/2018   Primary insomnia 09/15/2017   Generalized anxiety disorder 09/15/2017   Essential hypertension 09/09/2017   Varicose veins of both lower extremities with pain 09/09/2017   Bilateral lower extremity edema 09/09/2017   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/03/2015   Otitis externa, chronic 12/03/2015   Obstructive sleep apnea 10/07/2015   Asthma, persistent controlled 09/25/2015   Acquired hypothyroidism 02/29/2012   Chronic atrial fibrillation ([place]) 02/29/2012   Hyperlipidemia 02/29/2012    Allergies  Allergen Reactions   Tramadol Shortness Of Breath   Meloxicam Rash    Past Surgical History:  Procedure Laterality Date   ABDOMINAL HYSTERECTOMY  1991   uterus only- still has cervix - done for prolapse   CATARACT EXTRACTION W/PHACO Left 11/24/2020   Procedure: CATARACT  EXTRACTION PHACO AND INTRAOCULAR LENS PLACEMENT (IOC) LEFT 6.99 00:46.1;  Surgeon: Eulogio Bear, MD;  Location: [facility];  Service: Ophthalmology;  Laterality: Left;   CATARACT EXTRACTION W/PHACO Right 12/08/2020   Procedure: CATARACT EXTRACTION PHACO AND INTRAOCULAR LENS PLACEMENT (IOC) RIGHT 5.13 00:34.2;  Surgeon: Eulogio Bear, MD;  Location: [facility];  Service: Ophthalmology;  Laterality: Right;   COLONOSCOPY  2009   normal    TOE SURGERY      Social History   Tobacco Use   Smoking status: Former    Packs/day: 0.50    Years: 29.00    Total pack years: 14.50    Types: Cigarettes    Quit date: 09/28/1989    Years since quitting: 32.8   Smokeless tobacco: Never   Tobacco comments:    smoking cessation materials not required  Vaping Use   Vaping Use: Never used  Substance Use Topics   Alcohol use: No   Drug use: No     Medication list has been reviewed and updated.  Current Meds  Medication Sig   albuterol (PROVENTIL) (2.5 MG/3ML) 0.083% nebulizer solution Take 2.5 mg by nebulization every 6 (six) hours as needed.   ALPRAZolam (XANAX) 0.25 MG tablet Take 1 tablet (0.25 mg total) by mouth at bedtime.   Ascorbic Acid (VITAMIN C) 1000 MG tablet Take 1,000 mg by mouth daily.   baclofen (LIORESAL) 10 MG tablet Take 1 tablet (10 mg total) by mouth 3 (three) times daily.   Calcium Carb-Cholecalciferol [phone] MG-UNIT TABS Take by mouth.   celecoxib (CELEBREX) 200 MG capsule Take 200 mg by mouth daily.   cetirizine (ZYRTEC) 10 MG tablet Take 10 mg by mouth 2 (two) times daily.   cholecalciferol (VITAMIN D) 1000 units tablet Take 1,000 Units by mouth daily.   clotrimazole-betamethasone (LOTRISONE) cream Apply 1 application topically 2 (two) times daily. To rash under breast   Fluticasone-Umeclidin-Vilant (TRELEGY ELLIPTA) 200-62.5-25 MCG/ACT AEPB Inhale into the lungs.   gabapentin (NEURONTIN) 100 MG capsule Take 100 mg by mouth in the morning, at noon, and at bedtime.   ipratropium (ATROVENT) 0.06 % nasal spray Place 2 sprays into both nostrils 4 (four) times daily.   latanoprost (XALATAN) 0.005 % ophthalmic solution 1 drop at bedtime.   levothyroxine (SYNTHROID) 75 MCG tablet TAKE 1 TABLET BY MOUTH ONCE DAILY BEFORE BREAKFAST   losartan (COZAAR) 100 MG tablet Take 1 tablet (100 mg total) by mouth daily.   Magnesium 100 MG CAPS Take by mouth.   MELATONIN PO Take by mouth.   metoprolol tartrate  (LOPRESSOR) 25 MG tablet TAKE 1 TABLET BY MOUTH IN THE MORNING AND AT NOON AND AT BEDTIME - TAKE 1 TABLET THREE TIMES DAILY   montelukast (SINGULAIR) 10 MG tablet Take 10 mg by mouth daily.   Multiple Vitamins-Minerals (OCUVITE EYE HEATLH GUMMIES PO) Take by mouth daily.   Omega-3 Fatty Acids (FISH OIL PO) Take by mouth.   promethazine-dextromethorphan (PROMETHAZINE-DM) 6.25-15 MG/5ML syrup Take 5 mLs by mouth 4 (four) times daily as needed for cough.   Rivaroxaban (XARELTO) 15 MG TABS tablet Take 15 mg by mouth daily with supper.    spironolactone (ALDACTONE) 25 MG tablet Take 1 tablet (25 mg total) by mouth 2 (two) times daily.       02/11/2022    9:21 AM 09/24/2021   10:46 AM 09/09/2021   11:19 AM 06/17/2021    2:02 PM  GAD 7 : Generalized Anxiety Score  Nervous, Anxious, on Edge  3 0 0 0  Control/stop worrying _0 0  Worry too much - different things 2 1 0 0  Trouble relaxing 1 0 0 0  Restless 2 0 0 0  Easily annoyed or irritable 1 0 0 0  Afraid - awful might happen 0 0 0 0  Total GAD 7 Score _1 0  Anxiety Difficulty Very difficult Not difficult at all Not difficult at all Not difficult at all       05/21/2022   11:37 AM 02/11/2022    9:21 AM 09/24/2021   10:46 AM  Depression screen PHQ 2/9  Decreased Interest 0 0 0  Down, Depressed, Hopeless 0 1 0  PHQ - 2 Score 0 1 0  Altered sleeping  3 0  Tired, decreased energy  3 3  Change in appetite  2 0  Feeling bad or failure about yourself   1 0  Trouble concentrating  0 0  Moving slowly or fidgety/restless  0 0  Suicidal thoughts  0 0  PHQ-9 Score  10 3  Difficult doing work/chores  Very difficult Not difficult at all    BP Readings from Last 3 Encounters:  07/12/22 (!) 152/76  06/11/22 122/76  02/11/22 138/68    Physical Exam Vitals and nursing note reviewed.  Constitutional:      General: She is not in acute distress.    Appearance: Normal appearance. She is well-developed.  HENT:     Head: Normocephalic and  atraumatic.  Cardiovascular:     Rate and Rhythm: Normal rate. Rhythm irregular.  Pulmonary:     Effort: Pulmonary effort is normal. No respiratory distress.     Breath sounds: No wheezing or rhonchi.  Musculoskeletal:        General: Swelling (from varicose veins) present.     Right hand: Swelling (0.75 cm smooth soft mobile ganglion cyst over second MCP joint) present.     Cervical back: Normal range of motion.  Lymphadenopathy:     Cervical: No cervical adenopathy.  Skin:    General: Skin is warm and dry.     Findings: No rash.     Comments: Discoloration around both ankles c/w stasis skin changes   Neurological:     Mental Status: She is alert and oriented to person, place, and time.  Psychiatric:        Mood and Affect: Mood normal.        Behavior: Behavior normal.     Wt Readings from Last 3 Encounters:  07/12/22 206 lb (93.4 kg)  06/25/22 203 lb (92.1 kg)  06/11/22 203 lb (92.1 kg)    BP (!) 152/76   Pulse (!) 58   Resp (!) 95   Ht _2  (1.676 m)   Wt 206 lb (93.4 kg)   BMI 33.25 kg/m   Assessment and Plan: 1. Venous stasis dermatitis of both lower extremities Improved with control of edema Continue elevation and compression stockings daily No evidence of infection/cellulitis  2. Ganglion cyst of finger of right hand Benign - pt is reassured  3. Essential hypertension Clinically stable exam with well controlled BP. Tolerating medications without side effects at this time. Pt to continue current regimen and low sodium diet; benefits of regular exercise as able discussed.  4. Chronic atrial fibrillation ([place]) Rate controlled On DOAC   Partially dictated using Editor, commissioning. Any errors are unintentional.  Halina Maidens, MD [facility]  07/12/2022

## 2022-07-15 DIAGNOSIS — G4733 Obstructive sleep apnea (adult) (pediatric): Secondary | ICD-10-CM | POA: Diagnosis not present

## 2022-08-15 DIAGNOSIS — G4733 Obstructive sleep apnea (adult) (pediatric): Secondary | ICD-10-CM | POA: Diagnosis not present

## 2022-09-07 ENCOUNTER — Other Ambulatory Visit: Payer: Self-pay | Admitting: Internal Medicine

## 2022-09-07 DIAGNOSIS — F5101 Primary insomnia: Secondary | ICD-10-CM

## 2022-09-07 NOTE — Telephone Encounter (Signed)
Requested medications are due for refill today.  Provider to determine  Requested medications are on the active medications list.  yes  Last refill. 06/11/2022 #30 2 rf  Future visit scheduled.   yes  Notes to clinic.  Refill not delegated.    Requested Prescriptions  Pending Prescriptions Disp Refills   ALPRAZolam (XANAX) 0.25 MG tablet [Pharmacy Med Name: ALPRAZolam 0.25 MG Oral Tablet] 30 tablet 0    Sig: TAKE 1 TABLET BY MOUTH AT BEDTIME     Not Delegated - Psychiatry: Anxiolytics/Hypnotics 2 Failed - 09/07/2022  2:31 PM      Failed - This refill cannot be delegated      Failed - Urine Drug Screen completed in last 360 days      Passed - Patient is not pregnant      Passed - Valid encounter within last 6 months    Recent Outpatient Visits           1 month ago Venous stasis dermatitis of both lower extremities   [facility], Jesse Sans, MD   2 months ago Acute frontal sinusitis, recurrence not specified   [facility] [place], Earley Abide, MD   2 months ago Essential hypertension   [place] [facility], Jesse Sans, MD   6 months ago Venous stasis dermatitis of both lower extremities   [place] at [facility], Jesse Sans, MD   7 months ago Closed head injury, initial encounter   [place] at [facility], Jesse Sans, MD       Future Appointments             In 1 month Army Melia, Jesse Sans, MD [place] at [facility], [facility]

## 2022-09-08 NOTE — Telephone Encounter (Signed)
Please advise 

## 2022-09-15 DIAGNOSIS — G4733 Obstructive sleep apnea (adult) (pediatric): Secondary | ICD-10-CM | POA: Diagnosis not present

## 2022-09-21 ENCOUNTER — Other Ambulatory Visit: Payer: Self-pay | Admitting: Internal Medicine

## 2022-09-21 DIAGNOSIS — E039 Hypothyroidism, unspecified: Secondary | ICD-10-CM

## 2022-09-26 ENCOUNTER — Ambulatory Visit
Admission: EM | Admit: 2022-09-26 | Discharge: 2022-09-26 | Disposition: A | Discharge status: Home / Self Care | Payer: Medicare Other | Attending: Family Medicine | Admitting: Family Medicine

## 2022-09-26 DIAGNOSIS — L03116 Cellulitis of left lower limb: Secondary | ICD-10-CM | POA: Diagnosis not present

## 2022-09-26 MED ORDER — DOXYCYCLINE HYCLATE 100 MG PO CAPS: 100.0000 mg | ORAL_CAPSULE | Freq: Two times a day (BID) | ORAL | 0 refills | Status: AC

## 2022-09-26 NOTE — ED Triage Notes (Signed)
Pt states that she has a wound on her left lower leg that has yellow puss on it. Its been there since 09/18/22. No know injury. She noticed it when she went ot take a shower.

## 2022-09-26 NOTE — ED Provider Notes (Signed)
[facility]    CSN: [id number] Arrival date & time: 09/26/22  1048      History   Chief Complaint No chief complaint on file.   HPI Ariana Guzman is a 82 y.o. female.   HPI  Ariana Guzman presents for left lower leg wound that she noticed last Saturday evening.  Last night, she went to take a shower and the area looked raw. She has been putting Neosporin on it. She has noticed some yellow drainage from the area. Says the area is warm and red. There has been fever.    Past Medical History:  Diagnosis Date   Allergies    Arthritis    Asthma    Atrial fibrillation ([place])    Hypertension    Peripheral arterial disease ([place])    Sleep apnea    CPAP    Patient Active Problem List   Diagnosis Date Noted   Ganglion cyst of finger of right hand 07/12/2022   Primary osteoarthritis of left knee 06/11/2022   Spinal stenosis of lumbar region without neurogenic claudication 06/11/2022   Venous stasis dermatitis of both lower extremities 02/11/2022   Slow transit constipation 12/25/2020   Bursitis of hip 06/05/2020   Acquired thrombophilia ([place]) 06/05/2020   Hyponatremia 04/05/2020   Environmental and seasonal allergies 04/03/2020   Pre-diabetes 05/29/2018   Primary insomnia 09/15/2017   Generalized anxiety disorder 09/15/2017   Essential hypertension 09/09/2017   Varicose veins of both lower extremities with pain 09/09/2017   Bilateral lower extremity edema 09/09/2017   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/03/2015   Otitis externa, chronic 12/03/2015   Obstructive sleep apnea 10/07/2015   Asthma, persistent controlled 09/25/2015   Acquired hypothyroidism 02/29/2012   Chronic atrial fibrillation ([place]) 02/29/2012   Hyperlipidemia 02/29/2012    Past Surgical History:  Procedure Laterality Date   ABDOMINAL HYSTERECTOMY  1991   uterus only- still has cervix - done for prolapse   CATARACT EXTRACTION W/PHACO Left 11/24/2020   Procedure: CATARACT  EXTRACTION PHACO AND INTRAOCULAR LENS PLACEMENT (IOC) LEFT 6.99 00:46.1;  Surgeon: Eulogio Bear, MD;  Location: [facility];  Service: Ophthalmology;  Laterality: Left;   CATARACT EXTRACTION W/PHACO Right 12/08/2020   Procedure: CATARACT EXTRACTION PHACO AND INTRAOCULAR LENS PLACEMENT (IOC) RIGHT 5.13 00:34.2;  Surgeon: Eulogio Bear, MD;  Location: [facility];  Service: Ophthalmology;  Laterality: Right;   COLONOSCOPY  2009   normal   TOE SURGERY      OB History   No obstetric history on file.      Home Medications    Prior to Admission medications   Medication Sig Start Date End Date Taking? Authorizing Provider  albuterol (PROVENTIL) (2.5 MG/3ML) 0.083% nebulizer solution Take 2.5 mg by nebulization every 6 (six) hours as needed. 01/11/20  Yes [provider]  ALPRAZolam Duanne Moron) 0.25 MG tablet TAKE 1 TABLET BY MOUTH AT BEDTIME 09/08/22  Yes Glean Hess, MD  Ascorbic Acid (VITAMIN C) 1000 MG tablet Take 1,000 mg by mouth daily.   Yes [provider]  baclofen (LIORESAL) 10 MG tablet Take 1 tablet (10 mg total) by mouth 3 (three) times daily. 02/09/22  Yes Glean Hess, MD  Calcium Carb-Cholecalciferol [phone] MG-UNIT TABS Take by mouth.   Yes [provider]  celecoxib (CELEBREX) 200 MG capsule Take 200 mg by mouth daily. 04/08/22  Yes [provider]  cetirizine (ZYRTEC) 10 MG tablet Take 10 mg by mouth 2 (two) times daily.  Yes [provider]  cholecalciferol (VITAMIN D) 1000 units tablet Take 1,000 Units by mouth daily.   Yes [provider]  clotrimazole-betamethasone (LOTRISONE) cream Apply 1 application topically 2 (two) times daily. To rash under breast 04/16/21  Yes Glean Hess, MD  doxycycline (VIBRAMYCIN) 100 MG capsule Take 1 capsule (100 mg total) by mouth 2 (two) times daily for 7 days. 09/26/22 10/03/22 Yes Lillyanne Bradburn, Ronnette Juniper, DO  Fluticasone-Umeclidin-Vilant (TRELEGY ELLIPTA)  200-62.5-25 MCG/ACT AEPB Inhale into the lungs. 03/16/22  Yes [provider]  gabapentin (NEURONTIN) 100 MG capsule Take 100 mg by mouth in the morning, at noon, and at bedtime. 01/20/22 01/20/23 Yes [provider]  ipratropium (ATROVENT) 0.06 % nasal spray Place 2 sprays into both nostrils 4 (four) times daily. 12/25/21  Yes Laurene Footman B, PA-C  latanoprost (XALATAN) 0.005 % ophthalmic solution 1 drop at bedtime. 03/30/20  Yes [provider]  levothyroxine (SYNTHROID) 75 MCG tablet TAKE 1 TABLET BY MOUTH ONCE DAILY BEFORE BREAKFAST 09/21/22  Yes Glean Hess, MD  losartan (COZAAR) 100 MG tablet Take 1 tablet (100 mg total) by mouth daily. 06/11/22  Yes Glean Hess, MD  Magnesium 100 MG CAPS Take by mouth.   Yes [provider]  MELATONIN PO Take by mouth.   Yes [provider]  metoprolol tartrate (LOPRESSOR) 25 MG tablet TAKE 1 TABLET BY MOUTH IN THE MORNING AND AT NOON AND AT BEDTIME - TAKE 1 TABLET THREE TIMES DAILY 06/11/22  Yes Glean Hess, MD  montelukast (SINGULAIR) 10 MG tablet Take 10 mg by mouth daily. 07/06/20  Yes [provider]  Multiple Vitamins-Minerals ([place]) Take by mouth daily.   Yes [provider]  Omega-3 Fatty Acids (FISH OIL PO) Take by mouth.   Yes [provider]  promethazine-dextromethorphan (PROMETHAZINE-DM) 6.25-15 MG/5ML syrup Take 5 mLs by mouth 4 (four) times daily as needed for cough. 06/28/22  Yes Montel Culver, MD  Rivaroxaban (XARELTO) 15 MG TABS tablet Take 15 mg by mouth daily with supper.    Yes [provider]  spironolactone (ALDACTONE) 25 MG tablet Take 1 tablet (25 mg total) by mouth 2 (two) times daily. 06/11/22  Yes Glean Hess, MD  fexofenadine (ALLEGRA) 180 MG tablet Take 180 mg by mouth daily.  03/28/19  [provider]    Family History Family History  Problem Relation Age of Onset   Breast cancer Other 47    Dementia Mother    Cancer Father        prostate and bone   Heart attack Sister     Social History Social History   Tobacco Use   Smoking status: Former    Packs/day: 0.50    Years: 29.00    Total pack years: 14.50    Types: Cigarettes    Quit date: 09/28/1989    Years since quitting: 33.0   Smokeless tobacco: Never   Tobacco comments:    smoking cessation materials not required  Vaping Use   Vaping Use: Never used  Substance Use Topics   Alcohol use: No   Drug use: No     Allergies   Tramadol and Meloxicam   Review of Systems Review of Systems :negative unless otherwise stated in HPI.      Physical Exam Triage Vital Signs ED Triage Vitals [09/26/22 1144]  Enc Vitals Group     BP (!) 177/85     Pulse Rate 73  Resp 14     Temp 97.7 F (36.5 C)     Temp Source Oral     SpO2 95 %     Weight      Height      Head Circumference      Peak Flow      Pain Score      Pain Loc      Pain Edu?      Excl. in [place]?    No data found.  Updated Vital Signs BP (!) 177/85 (BP Location: Left Arm)   Pulse 73   Temp 97.7 F (36.5 C) (Oral)   Resp 14   SpO2 95%   Visual Acuity Right Eye Distance:   Left Eye Distance:   Bilateral Distance:    Right Eye Near:   Left Eye Near:    Bilateral Near:     Physical Exam  GEN: alert, non-ill appearing female, in no acute distress  EYES: wears glasses, no scleral injection CV: regular rate, DP pulse is palpable  RESP: no increased work of breathing MSK: 2+ extremity edema (chronic per pt)  PSYCH: Normal affect, appropriate speech and behavior  SKIN: warm and dry; left lower leg warmth and erythema, yellow tinged fluid expressed      UC Treatments / Results  Labs (all labs ordered are listed, but only abnormal results are displayed) Labs Reviewed - No data to display  EKG   Radiology No results found.  Procedures Procedures (including critical care time)  Medications Ordered in UC Medications - No  data to display  Initial Impression / Assessment and Plan / UC Course  I have reviewed the triage vital signs and the nursing notes.  Pertinent labs & imaging results that were available during my care of the patient were reviewed by me and considered in my medical decision making (see chart for details).     Patient is a 82 y.o. femalewho presents for leg leg wound for the past week.  Overall, patient is well-appearing and well-hydrated.  Vital signs stable.  Ariana Guzman is afebrile.  Exam concerning for cellulitis.  Treat with antibiotics as below. Wound cleaned and dressed with non-stick dressing prior to discharge.    **BP   Reviewed expectations regarding course of current medical issues.  All questions asked were answered.  Outlined signs and symptoms indicating need for more acute intervention. Patient verbalized understanding. After Visit Summary given.   Final Clinical Impressions(s) / UC Diagnoses   Final diagnoses:  Cellulitis of leg, left     Discharge Instructions      Stop by the pharmacy to pick up your antibiotics for your left lower leg infection.  Follow up with your primary care provider as needed.   Return to the urgent care or go to the ED, if you start to have increasing redness or fever.      ED Prescriptions     Medication Sig Dispense Auth. Provider   doxycycline (VIBRAMYCIN) 100 MG capsule Take 1 capsule (100 mg total) by mouth 2 (two) times daily for 7 days. 14 capsule Berthold Glace, DO      PDMP not reviewed this encounter.

## 2022-09-26 NOTE — Discharge Instructions (Addendum)
Stop by the pharmacy to pick up your antibiotics for your left lower leg infection.  Follow up with your primary care provider as needed.   Return to the urgent care or go to the ED, if you start to have increasing redness or fever.

## 2022-10-05 DIAGNOSIS — I83893 Varicose veins of bilateral lower extremities with other complications: Secondary | ICD-10-CM | POA: Diagnosis not present

## 2022-10-05 DIAGNOSIS — L84 Corns and callosities: Secondary | ICD-10-CM | POA: Diagnosis not present

## 2022-10-05 DIAGNOSIS — M792 Neuralgia and neuritis, unspecified: Secondary | ICD-10-CM | POA: Diagnosis not present

## 2022-10-05 DIAGNOSIS — L909 Atrophic disorder of skin, unspecified: Secondary | ICD-10-CM | POA: Diagnosis not present

## 2022-10-05 DIAGNOSIS — I739 Peripheral vascular disease, unspecified: Secondary | ICD-10-CM | POA: Diagnosis not present

## 2022-10-05 DIAGNOSIS — I890 Lymphedema, not elsewhere classified: Secondary | ICD-10-CM | POA: Diagnosis not present

## 2022-10-05 DIAGNOSIS — M21621 Bunionette of right foot: Secondary | ICD-10-CM | POA: Diagnosis not present

## 2022-10-05 DIAGNOSIS — L851 Acquired keratosis [keratoderma] palmaris et plantaris: Secondary | ICD-10-CM | POA: Diagnosis not present

## 2022-10-05 DIAGNOSIS — B351 Tinea unguium: Secondary | ICD-10-CM | POA: Diagnosis not present

## 2022-10-05 DIAGNOSIS — L97921 Non-pressure chronic ulcer of unspecified part of left lower leg limited to breakdown of skin: Secondary | ICD-10-CM | POA: Diagnosis not present

## 2022-10-05 DIAGNOSIS — M2041 Other hammer toe(s) (acquired), right foot: Secondary | ICD-10-CM | POA: Diagnosis not present

## 2022-10-05 DIAGNOSIS — M2011 Hallux valgus (acquired), right foot: Secondary | ICD-10-CM | POA: Diagnosis not present

## 2022-10-14 DIAGNOSIS — G4733 Obstructive sleep apnea (adult) (pediatric): Secondary | ICD-10-CM | POA: Diagnosis not present

## 2022-10-18 ENCOUNTER — Encounter: Payer: Medicare Other | Admitting: Internal Medicine

## 2022-11-11 DIAGNOSIS — I482 Chronic atrial fibrillation, unspecified: Secondary | ICD-10-CM | POA: Diagnosis not present

## 2022-11-14 DIAGNOSIS — G4733 Obstructive sleep apnea (adult) (pediatric): Secondary | ICD-10-CM | POA: Diagnosis not present

## 2022-11-29 DIAGNOSIS — I10 Essential (primary) hypertension: Secondary | ICD-10-CM | POA: Diagnosis not present

## 2022-11-29 DIAGNOSIS — I482 Chronic atrial fibrillation, unspecified: Secondary | ICD-10-CM | POA: Diagnosis not present

## 2022-12-07 DIAGNOSIS — G4733 Obstructive sleep apnea (adult) (pediatric): Secondary | ICD-10-CM | POA: Diagnosis not present

## 2022-12-07 DIAGNOSIS — J45901 Unspecified asthma with (acute) exacerbation: Secondary | ICD-10-CM | POA: Diagnosis not present

## 2022-12-07 DIAGNOSIS — J441 Chronic obstructive pulmonary disease with (acute) exacerbation: Secondary | ICD-10-CM | POA: Diagnosis not present

## 2022-12-07 DIAGNOSIS — J45998 Other asthma: Secondary | ICD-10-CM | POA: Diagnosis not present

## 2022-12-14 DIAGNOSIS — G4733 Obstructive sleep apnea (adult) (pediatric): Secondary | ICD-10-CM | POA: Diagnosis not present

## 2022-12-20 DIAGNOSIS — G4733 Obstructive sleep apnea (adult) (pediatric): Secondary | ICD-10-CM | POA: Diagnosis not present

## 2022-12-20 DIAGNOSIS — I4891 Unspecified atrial fibrillation: Secondary | ICD-10-CM | POA: Diagnosis not present

## 2022-12-20 DIAGNOSIS — J310 Chronic rhinitis: Secondary | ICD-10-CM | POA: Diagnosis not present

## 2022-12-20 DIAGNOSIS — J45998 Other asthma: Secondary | ICD-10-CM | POA: Diagnosis not present

## 2022-12-21 DIAGNOSIS — G4733 Obstructive sleep apnea (adult) (pediatric): Secondary | ICD-10-CM | POA: Diagnosis not present

## 2022-12-27 ENCOUNTER — Other Ambulatory Visit: Payer: Self-pay | Admitting: Internal Medicine

## 2022-12-27 DIAGNOSIS — I10 Essential (primary) hypertension: Secondary | ICD-10-CM

## 2022-12-30 DIAGNOSIS — M5416 Radiculopathy, lumbar region: Secondary | ICD-10-CM | POA: Diagnosis not present

## 2023-01-01 ENCOUNTER — Ambulatory Visit
Admission: EM | Admit: 2023-01-01 | Discharge: 2023-01-01 | Disposition: A | Discharge status: Home / Self Care | Payer: Medicare Other

## 2023-01-01 DIAGNOSIS — S8012XA Contusion of left lower leg, initial encounter: Secondary | ICD-10-CM | POA: Diagnosis not present

## 2023-01-01 DIAGNOSIS — T148XXA Other injury of unspecified body region, initial encounter: Secondary | ICD-10-CM

## 2023-01-01 NOTE — Discharge Instructions (Addendum)
As we discussed, you need to keep your left leg elevated is much as possible to help with swelling.  Wear the Ace wrap during the day to help your body reabsorb the blood caused by the injury from the glass vase.  Take the Ace wrap off at bedtime and do not wear it to sleep.  You may apply ice on top of the Ace wrap for 20 minutes at a time 2-3 times a day to help decrease swelling.  Let the blister rupture on its own and do not pop it yourself.  Once it ruptures keep a dressing over the delicate skin until the blister covering falls off.  If you develop any redness, pain, or heat at the site of injury you need to either return for reevaluation or see your primary care provider.

## 2023-01-01 NOTE — ED Provider Notes (Signed)
[facility]    CSN: [id number] Arrival date & time: 01/01/23  1115      History   Chief Complaint No chief complaint on file.   HPI Ariana Guzman is a 82 y.o. female.   HPI  82 year old female with a past medical history significant for hypertension, peripheral artery disease, sleep apnea, atrial fibrillation, asthma, and varicose veins of both lower extremities presents for evaluation of bruising and a blister on her left shin.  She reports that a glass vase fell and struck her on her shin yesterday.  She kept her leg elevated and applied ice but some of her friends encouraged her to come have her leg looked at.  Patient does not have any pain when she walks and she denies numbness or tingling in her foot or toes.  The patient is on Xarelto.  Past Medical History:  Diagnosis Date   Allergies    Arthritis    Asthma    Atrial fibrillation (HCC)    Hypertension    Peripheral arterial disease (HCC)    Sleep apnea    CPAP    Patient Active Problem List   Diagnosis Date Noted   Ganglion cyst of finger of right hand 07/12/2022   Primary osteoarthritis of left knee 06/11/2022   Spinal stenosis of lumbar region without neurogenic claudication 06/11/2022   Venous stasis dermatitis of both lower extremities 02/11/2022   Slow transit constipation 12/25/2020   Bursitis of hip 06/05/2020   Acquired thrombophilia (HCC) 06/05/2020   Hyponatremia 04/05/2020   Environmental and seasonal allergies 04/03/2020   Pre-diabetes 05/29/2018   Primary insomnia 09/15/2017   Generalized anxiety disorder 09/15/2017   Essential hypertension 09/09/2017   Varicose veins of both lower extremities with pain 09/09/2017   Bilateral lower extremity edema 09/09/2017   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/03/2015   Otitis externa, chronic 12/03/2015   Obstructive sleep apnea 10/07/2015   Asthma, persistent controlled 09/25/2015   Acquired hypothyroidism 02/29/2012    Chronic atrial fibrillation (HCC) 02/29/2012   Hyperlipidemia 02/29/2012    Past Surgical History:  Procedure Laterality Date   ABDOMINAL HYSTERECTOMY  1991   uterus only- still has cervix - done for prolapse   CATARACT EXTRACTION W/PHACO Left 11/24/2020   Procedure: CATARACT EXTRACTION PHACO AND INTRAOCULAR LENS PLACEMENT (IOC) LEFT 6.99 00:46.1;  Surgeon: Nevada Crane, MD;  Location: [facility];  Service: Ophthalmology;  Laterality: Left;   CATARACT EXTRACTION W/PHACO Right 12/08/2020   Procedure: CATARACT EXTRACTION PHACO AND INTRAOCULAR LENS PLACEMENT (IOC) RIGHT 5.13 00:34.2;  Surgeon: Nevada Crane, MD;  Location: [facility];  Service: Ophthalmology;  Laterality: Right;   COLONOSCOPY  2009   normal   TOE SURGERY      OB History   No obstetric history on file.      Home Medications    Prior to Admission medications   Medication Sig Start Date End Date Taking? Authorizing Provider  albuterol (PROVENTIL) (2.5 MG/3ML) 0.083% nebulizer solution Take 2.5 mg by nebulization every 6 (six) hours as needed. 01/11/20   [provider]  ALPRAZolam Prudy Feeler) 0.25 MG tablet TAKE 1 TABLET BY MOUTH AT BEDTIME 09/08/22   Reubin Milan, MD  Ascorbic Acid (VITAMIN C) 1000 MG tablet Take 1,000 mg by mouth daily.    [provider]  baclofen (LIORESAL) 10 MG tablet Take 1 tablet (10 mg total) by mouth 3 (three) times daily. 02/09/22   Reubin Milan, MD  Calcium Carb-Cholecalciferol  [phone] MG-UNIT TABS Take by mouth.    [provider]  celecoxib (CELEBREX) 200 MG capsule Take 200 mg by mouth daily. 04/08/22   [provider]  cetirizine (ZYRTEC) 10 MG tablet Take 10 mg by mouth 2 (two) times daily.    [provider]  cholecalciferol (VITAMIN D) 1000 units tablet Take 1,000 Units by mouth daily.    [provider]  clotrimazole-betamethasone (LOTRISONE) cream Apply 1 application topically 2 (two) times daily. To  rash under breast 04/16/21   Reubin Milan, MD  Fluticasone-Umeclidin-Vilant (TRELEGY ELLIPTA) 200-62.5-25 MCG/ACT AEPB Inhale into the lungs. 03/16/22   [provider]  gabapentin (NEURONTIN) 100 MG capsule Take 100 mg by mouth in the morning, at noon, and at bedtime. 01/20/22 01/20/23  [provider]  ipratropium (ATROVENT) 0.06 % nasal spray Place 2 sprays into both nostrils 4 (four) times daily. 12/25/21   Eusebio Friendly B, PA-C  latanoprost (XALATAN) 0.005 % ophthalmic solution 1 drop at bedtime. 03/30/20   [provider]  levothyroxine (SYNTHROID) 75 MCG tablet TAKE 1 TABLET BY MOUTH ONCE DAILY BEFORE BREAKFAST 09/21/22   Reubin Milan, MD  losartan (COZAAR) 100 MG tablet Take 1 tablet (100 mg total) by mouth daily. 06/11/22   Reubin Milan, MD  Magnesium 100 MG CAPS Take by mouth.    [provider]  MELATONIN PO Take by mouth.    [provider]  metoprolol tartrate (LOPRESSOR) 25 MG tablet TAKE 1 TABLET BY MOUTH IN THE MORNING AND AT NOON AND AT BEDTIME - TAKE 1 TABLET THREE TIMES DAILY 06/11/22   Reubin Milan, MD  montelukast (SINGULAIR) 10 MG tablet Take 10 mg by mouth daily. 07/06/20   [provider]  Multiple Vitamins-Minerals (OCUVITE EYE HEATLH GUMMIES PO) Take by mouth daily.    [provider]  Omega-3 Fatty Acids (FISH OIL PO) Take by mouth.    [provider]  promethazine-dextromethorphan (PROMETHAZINE-DM) 6.25-15 MG/5ML syrup Take 5 mLs by mouth 4 (four) times daily as needed for cough. 06/28/22   Jerrol Banana, MD  Rivaroxaban (XARELTO) 15 MG TABS tablet Take 15 mg by mouth daily with supper.     [provider]  spironolactone (ALDACTONE) 25 MG tablet Take 1 tablet by mouth twice daily 12/27/22   Reubin Milan, MD  fexofenadine (ALLEGRA) 180 MG tablet Take 180 mg by mouth daily.  03/28/19  [provider]    Family History Family History  Problem Relation Age of Onset    Breast cancer Other 11   Dementia Mother    Cancer Father        prostate and bone   Heart attack Sister     Social History Social History   Tobacco Use   Smoking status: Former    Packs/day: 0.50    Years: 29.00    Additional pack years: 0.00    Total pack years: 14.50    Types: Cigarettes    Quit date: 09/28/1989    Years since quitting: 33.2   Smokeless tobacco: Never   Tobacco comments:    smoking cessation materials not required  Vaping Use   Vaping Use: Never used  Substance Use Topics   Alcohol use: No   Drug use: No     Allergies   Tramadol and Meloxicam   Review of Systems Review of Systems  Skin:  Positive for color change. Negative for wound.       Blister on left  shin.     Physical Exam Triage Vital Signs ED Triage Vitals  Enc Vitals Group     BP 01/01/23 1136 (!) 161/68     Pulse Rate 01/01/23 1127 68     Resp 01/01/23 1127 18     Temp 01/01/23 1127 97.6 F (36.4 C)     Temp Source 01/01/23 1127 Oral     SpO2 01/01/23 1127 98 %     Weight 01/01/23 1132 196 lb (88.9 kg)     Height 01/01/23 1132 5\' 6"  (1.676 m)     Head Circumference --      Peak Flow --      Pain Score 01/01/23 1132 5     Pain Loc --      Pain Edu? --      Excl. in GC? --    No data found.  Updated Vital Signs BP (!) 161/68 (BP Location: Left Arm)   Pulse 68   Temp 97.6 F (36.4 C) (Oral)   Resp 18   Ht 5\' 6"  (1.676 m)   Wt 196 lb (88.9 kg)   SpO2 98%   BMI 31.64 kg/m   Visual Acuity Right Eye Distance:   Left Eye Distance:   Bilateral Distance:    Right Eye Near:   Left Eye Near:    Bilateral Near:     Physical Exam Vitals and nursing note reviewed.  Constitutional:      Appearance: Normal appearance. She is not ill-appearing.  Musculoskeletal:        General: Swelling, tenderness and signs of injury present. No deformity.  Skin:    Capillary Refill: Capillary refill takes less than 2 seconds.     Findings: Bruising present. No erythema.   Neurological:     General: No focal deficit present.     Mental Status: She is alert and oriented to person, place, and time.      UC Treatments / Results  Labs (all labs ordered are listed, but only abnormal results are displayed) Labs Reviewed - No data to display  EKG   Radiology No results found.  Procedures Procedures (including critical care time)  Medications Ordered in UC Medications - No data to display  Initial Impression / Assessment and Plan / UC Course  I have reviewed the triage vital signs and the nursing notes.  Pertinent labs & imaging results that were available during my care of the patient were reviewed by me and considered in my medical decision making (see chart for details).   Patient is a pleasant, nontoxic-appearing 82 year old female on Xarelto for A-fib who presents for evaluation of bruising to her left lower extremity along with a blister.  She reports that the bruising showed up yesterday and then the blister this morning.  It is mildly tender to touch but it does not hurt when she walks and she has no numbness or tingling in her foot.  She does have ecchymosis to the distal aspect of her left lower leg but there is no erythema or warmth.    As you can see in image above, there is a large blood blister with several surrounding varicosities.  It is unclear if this was a ruptured varicosity or a ruptured spider vein, as there are many of those in the surrounding tissue.  Her DP and PT pulses are 2+.  I advised the patient that we will supply her with an Ace wrap and she needs to keep her left leg wrapped with an  Ace wrap during the day and elevated.  She may put ice on top of the Ace wrap to help with pain and swelling as needed.  She has already been applying ice at home.  I have advised her to let the blister rupture on its own.  If she develops any redness, pain, or warmth near the blister she should return for reevaluation or see her PCP.   Final  Clinical Impressions(s) / UC Diagnoses   Final diagnoses:  Contusion of left lower leg, initial encounter  Blood blister     Discharge Instructions      As we discussed, you need to keep your left leg elevated is much as possible to help with swelling.  Wear the Ace wrap during the day to help your body reabsorb the blood caused by the injury from the glass vase.  Take the Ace wrap off at bedtime and do not wear it to sleep.  You may apply ice on top of the Ace wrap for 20 minutes at a time 2-3 times a day to help decrease swelling.  Let the blister rupture on its own and do not pop it yourself.  Once it ruptures keep a dressing over the delicate skin until the blister covering falls off.  If you develop any redness, pain, or heat at the site of injury you need to either return for reevaluation or see your primary care provider.     ED Prescriptions   None    PDMP not reviewed this encounter.   Becky Augusta, NP 01/01/23 1208

## 2023-01-01 NOTE — ED Triage Notes (Addendum)
Patient presents with blister  and bruising on left lower leg, states a glass vase fell on leg yesterday morning.

## 2023-01-06 ENCOUNTER — Encounter: Payer: Self-pay | Admitting: Internal Medicine

## 2023-01-06 ENCOUNTER — Ambulatory Visit (INDEPENDENT_AMBULATORY_CARE_PROVIDER_SITE_OTHER): Payer: Medicare Other | Admitting: Internal Medicine

## 2023-01-06 VITALS — BP 118/78 | HR 74 | Ht 66.0 in | Wt 215.0 lb

## 2023-01-06 DIAGNOSIS — D6869 Other thrombophilia: Secondary | ICD-10-CM | POA: Diagnosis not present

## 2023-01-06 DIAGNOSIS — Z1231 Encounter for screening mammogram for malignant neoplasm of breast: Secondary | ICD-10-CM | POA: Diagnosis not present

## 2023-01-06 DIAGNOSIS — E039 Hypothyroidism, unspecified: Secondary | ICD-10-CM | POA: Diagnosis not present

## 2023-01-06 DIAGNOSIS — Z0000 Encounter for general adult medical examination without abnormal findings: Secondary | ICD-10-CM

## 2023-01-06 DIAGNOSIS — S8012XD Contusion of left lower leg, subsequent encounter: Secondary | ICD-10-CM | POA: Diagnosis not present

## 2023-01-06 DIAGNOSIS — R7303 Prediabetes: Secondary | ICD-10-CM

## 2023-01-06 DIAGNOSIS — I10 Essential (primary) hypertension: Secondary | ICD-10-CM

## 2023-01-06 DIAGNOSIS — I482 Chronic atrial fibrillation, unspecified: Secondary | ICD-10-CM | POA: Diagnosis not present

## 2023-01-06 DIAGNOSIS — E782 Mixed hyperlipidemia: Secondary | ICD-10-CM | POA: Diagnosis not present

## 2023-01-06 MED ORDER — METOPROLOL TARTRATE 25 MG PO TABS: ORAL_TABLET | ORAL | 1 refills | Status: DC

## 2023-01-06 MED ORDER — LOSARTAN POTASSIUM 100 MG PO TABS: 100.0000 mg | ORAL_TABLET | Freq: Every day | ORAL | 1 refills | Status: DC

## 2023-01-06 NOTE — Assessment & Plan Note (Signed)
Rate controlled by metoprolol Anticoagulation with Xarelto.

## 2023-01-06 NOTE — Assessment & Plan Note (Signed)
Stable exam with well controlled BP.  Currently taking losartan, metoprolol, spironolactone. Tolerating medications without concerns or side effects. Will continue to recommend low sodium diet and current regimen.

## 2023-01-06 NOTE — Assessment & Plan Note (Signed)
supplemented

## 2023-01-06 NOTE — Progress Notes (Signed)
Date:  01/06/2023   Name:  Ariana Guzman   DOB:  11-16-1940   MRN:  [id number]   Chief Complaint: Annual Exam Ariana Guzman is a 82 y.o. female who presents today for her Complete Annual Exam. She feels well. She reports exercising - none at this time. She reports she is sleeping fairly well. Breast complaints - none.  Mammogram: 07/2018  DEXA: none Colonoscopy: aged out  Health Maintenance Due  Topic Date Due   Zoster Vaccines- Shingrix (1 of 2) Never done   MAMMOGRAM  08/03/2019   COVID-19 Vaccine (6 - 2023-24 season) 08/06/2022    Immunization History  Administered Date(s) Administered   COVID-19, mRNA, vaccine(Comirnaty)12 years and older 06/11/2022   Fluad Quad(high Dose 65+) 05/28/2019, 05/08/2020   Influenza, High Dose Seasonal PF 08/11/2017, 04/24/2018   Influenza-Unspecified 05/25/2022   PFIZER(Purple Top)SARS-COV-2 Vaccination 10/08/2019, 10/29/2019, 07/17/2020, 05/16/2021   Pneumococcal Conjugate-13 04/24/2018   Pneumococcal Polysaccharide-23 05/28/2019   Rsv, Bivalent, Protein Subunit Rsvpref,pf Verdis Frederickson) 05/25/2022   Tdap 02/26/2015    Hypertension This is a chronic problem. The problem is controlled. Pertinent negatives include no chest pain, headaches, palpitations or shortness of breath. Past treatments include angiotensin blockers, beta blockers and diuretics. The current treatment provides significant improvement. Hypertensive end-organ damage includes CAD/MI. There is no history of kidney disease or CVA. Identifiable causes of hypertension include a thyroid problem.  Thyroid Problem Presents for follow-up visit. Patient reports no anxiety, constipation, diarrhea, fatigue, palpitations or tremors. The symptoms have been stable.  Leg wound - a glass table top hit her left anterior shin causing a blood blister.  She went to UC 5 days and was told to elevate and wrap it.  It has not changed other than some dependent subcutaneous bruising.  Lab Results   Component Value Date   NA 128 (L) 02/11/2022   K 4.8 02/11/2022   CO2 25 02/11/2022   GLUCOSE 114 (H) 02/11/2022   BUN 14 02/11/2022   CREATININE 0.75 02/11/2022   CALCIUM 9.5 02/11/2022   EGFR 79 02/11/2022   GFRNONAA 63 08/14/2020   Lab Results  Component Value Date   CHOL 177 09/24/2021   HDL 56 09/24/2021   LDLCALC 104 (H) 09/24/2021   TRIG 92 09/24/2021   CHOLHDL 3.2 09/24/2021   Lab Results  Component Value Date   TSH 2.890 02/11/2022   Lab Results  Component Value Date   HGBA1C 6.4 (A) 06/11/2022   Lab Results  Component Value Date   WBC 9.2 09/24/2021   HGB 12.9 09/24/2021   HCT 38.7 09/24/2021   MCV 91 09/24/2021   PLT 297 09/24/2021   Lab Results  Component Value Date   ALT 15 02/11/2022   AST 15 02/11/2022   ALKPHOS 63 02/11/2022   BILITOT 1.2 02/11/2022   No results found for: "25OHVITD2", "25OHVITD3", "VD25OH"   Review of Systems  Constitutional:  Negative for chills, fatigue and fever.  HENT:  Negative for congestion, hearing loss, tinnitus, trouble swallowing and voice change.   Eyes:  Negative for visual disturbance.  Respiratory:  Negative for cough, chest tightness, shortness of breath and wheezing.   Cardiovascular:  Negative for chest pain, palpitations and leg swelling.  Gastrointestinal:  Negative for abdominal pain, constipation, diarrhea and vomiting.  Endocrine: Negative for polydipsia and polyuria.  Genitourinary:  Negative for dysuria, frequency, genital sores, vaginal bleeding and vaginal discharge.  Musculoskeletal:  Positive for arthralgias and gait problem. Negative for joint swelling.  Skin:  Positive for wound. Negative for color change and rash.  Neurological:  Negative for dizziness, tremors, light-headedness and headaches.  Hematological:  Negative for adenopathy. Does not bruise/bleed easily.  Psychiatric/Behavioral:  Negative for dysphoric mood and sleep disturbance. The patient is not nervous/anxious.     Patient  Active Problem List   Diagnosis Date Noted   Ganglion cyst of finger of right hand 07/12/2022   Primary osteoarthritis of left knee 06/11/2022   Spinal stenosis of lumbar region without neurogenic claudication 06/11/2022   Venous stasis dermatitis of both lower extremities 02/11/2022   Slow transit constipation 12/25/2020   Bursitis of hip 06/05/2020   Acquired thrombophilia (HCC) 06/05/2020   Hyponatremia 04/05/2020   Environmental and seasonal allergies 04/03/2020   Pre-diabetes 05/29/2018   Primary insomnia 09/15/2017   Generalized anxiety disorder 09/15/2017   Essential hypertension 09/09/2017   Varicose veins of both lower extremities with pain 09/09/2017   Bilateral lower extremity edema 09/09/2017   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/03/2015   Otitis externa, chronic 12/03/2015   Obstructive sleep apnea 10/07/2015   Asthma, persistent controlled 09/25/2015   Acquired hypothyroidism 02/29/2012   Chronic atrial fibrillation (HCC) 02/29/2012   Mixed hyperlipidemia 02/29/2012    Allergies  Allergen Reactions   Tramadol Shortness Of Breath   Meloxicam Rash    Past Surgical History:  Procedure Laterality Date   ABDOMINAL HYSTERECTOMY  1991   uterus only- still has cervix - done for prolapse   CATARACT EXTRACTION W/PHACO Left 11/24/2020   Procedure: CATARACT EXTRACTION PHACO AND INTRAOCULAR LENS PLACEMENT (IOC) LEFT 6.99 00:46.1;  Surgeon: Nevada Crane, MD;  Location: [facility];  Service: Ophthalmology;  Laterality: Left;   CATARACT EXTRACTION W/PHACO Right 12/08/2020   Procedure: CATARACT EXTRACTION PHACO AND INTRAOCULAR LENS PLACEMENT (IOC) RIGHT 5.13 00:34.2;  Surgeon: Nevada Crane, MD;  Location: [facility];  Service: Ophthalmology;  Laterality: Right;   COLONOSCOPY  2009   normal   TOE SURGERY      Social History   Tobacco Use   Smoking status: Former    Packs/day: 0.50    Years: 29.00    Additional pack years:  0.00    Total pack years: 14.50    Types: Cigarettes    Quit date: 09/28/1989    Years since quitting: 33.2   Smokeless tobacco: Never   Tobacco comments:    smoking cessation materials not required  Vaping Use   Vaping Use: Never used  Substance Use Topics   Alcohol use: No   Drug use: No     Medication list has been reviewed and updated.  Current Meds  Medication Sig   albuterol (PROVENTIL) (2.5 MG/3ML) 0.083% nebulizer solution Take 2.5 mg by nebulization every 6 (six) hours as needed.   albuterol (VENTOLIN HFA) 108 (90 Base) MCG/ACT inhaler Inhale 2 puffs into the lungs every 4 (four) hours as needed.   ALPRAZolam (XANAX) 0.25 MG tablet TAKE 1 TABLET BY MOUTH AT BEDTIME   Ascorbic Acid (VITAMIN C) 1000 MG tablet Take 1,000 mg by mouth daily.   baclofen (LIORESAL) 10 MG tablet Take 1 tablet (10 mg total) by mouth 3 (three) times daily.   Calcium Carb-Cholecalciferol [phone] MG-UNIT TABS Take by mouth.   celecoxib (CELEBREX) 200 MG capsule Take 200 mg by mouth daily.   cetirizine (ZYRTEC) 10 MG tablet Take 10 mg by mouth 2 (two) times daily.   cholecalciferol (VITAMIN D) 1000 units tablet Take 1,000 Units by mouth daily.  clotrimazole-betamethasone (LOTRISONE) cream Apply 1 application topically 2 (two) times daily. To rash under breast   Fluticasone-Umeclidin-Vilant (TRELEGY ELLIPTA) 200-62.5-25 MCG/ACT AEPB Inhale into the lungs.   gabapentin (NEURONTIN) 100 MG capsule Take 100 mg by mouth in the morning, at noon, and at bedtime.   ipratropium (ATROVENT) 0.06 % nasal spray Place 2 sprays into both nostrils 4 (four) times daily.   latanoprost (XALATAN) 0.005 % ophthalmic solution 1 drop at bedtime.   levothyroxine (SYNTHROID) 75 MCG tablet TAKE 1 TABLET BY MOUTH ONCE DAILY BEFORE BREAKFAST   Magnesium 100 MG CAPS Take by mouth.   MELATONIN PO Take by mouth.   montelukast (SINGULAIR) 10 MG tablet Take 10 mg by mouth daily.   Multiple Vitamins-Minerals (OCUVITE EYE HEATLH  GUMMIES PO) Take by mouth daily.   Omega-3 Fatty Acids (FISH OIL PO) Take by mouth.   Rivaroxaban (XARELTO) 15 MG TABS tablet Take 15 mg by mouth daily with supper.    spironolactone (ALDACTONE) 25 MG tablet Take 1 tablet by mouth twice daily   [DISCONTINUED] fexofenadine (ALLEGRA) 180 MG tablet Take 180 mg by mouth daily.   [DISCONTINUED] losartan (COZAAR) 100 MG tablet Take 1 tablet (100 mg total) by mouth daily.   [DISCONTINUED] metoprolol tartrate (LOPRESSOR) 25 MG tablet TAKE 1 TABLET BY MOUTH IN THE MORNING AND AT NOON AND AT BEDTIME - TAKE 1 TABLET THREE TIMES DAILY       01/06/2023   10:42 AM 07/12/2022    3:40 PM 02/11/2022    9:21 AM 09/24/2021   10:46 AM  GAD 7 : Generalized Anxiety Score  Nervous, Anxious, on Edge 0 2 3 0  Control/stop worrying 0 2 2 1   Worry too much - different things 0 2 2 1   Trouble relaxing 0 0 1 0  Restless 0 0 2 0  Easily annoyed or irritable 0 0 1 0  Afraid - awful might happen 0 0 0 0  Total GAD 7 Score 0 6 11 2   Anxiety Difficulty Not difficult at all Not difficult at all Very difficult Not difficult at all       01/06/2023   10:42 AM 07/12/2022    3:40 PM 05/21/2022   11:37 AM  Depression screen PHQ 2/9  Decreased Interest 0 0 0  Down, Depressed, Hopeless 0 0 0  PHQ - 2 Score 0 0 0  Altered sleeping 0 0   Tired, decreased energy 2 2   Change in appetite 0 0   Feeling bad or failure about yourself  0 0   Trouble concentrating 0 0   Moving slowly or fidgety/restless 0 0   Suicidal thoughts 0 0   PHQ-9 Score 2 2   Difficult doing work/chores Not difficult at all Not difficult at all     BP Readings from Last 3 Encounters:  01/06/23 118/78  01/01/23 (!) 161/68  09/26/22 (!) 141/84    Physical Exam Vitals and nursing note reviewed.  Constitutional:      General: She is not in acute distress.    Appearance: Normal appearance. She is well-developed.  HENT:     Head: Normocephalic and atraumatic.     Right Ear: Tympanic membrane  and ear canal normal.     Left Ear: Tympanic membrane and ear canal normal.     Nose:     Right Sinus: No maxillary sinus tenderness.     Left Sinus: No maxillary sinus tenderness.  Eyes:     General: No scleral icterus.  Right eye: No discharge.        Left eye: No discharge.     Conjunctiva/sclera: Conjunctivae normal.  Neck:     Thyroid: No thyromegaly.     Vascular: No carotid bruit.  Cardiovascular:     Rate and Rhythm: Normal rate and regular rhythm.     Pulses: Normal pulses.     Heart sounds: Normal heart sounds.  Pulmonary:     Effort: Pulmonary effort is normal. No respiratory distress.     Breath sounds: No wheezing.  Chest:  Breasts:    Right: No mass, nipple discharge, skin change or tenderness.     Left: No mass, nipple discharge, skin change or tenderness.  Abdominal:     General: Bowel sounds are normal.     Palpations: Abdomen is soft.     Tenderness: There is no abdominal tenderness.  Musculoskeletal:        General: Swelling (minimal ankle swelling) present.     Cervical back: Normal range of motion. No erythema.     Right lower leg: No edema.     Left lower leg: No edema.  Lymphadenopathy:     Cervical: No cervical adenopathy.  Skin:    General: Skin is warm and dry.     Capillary Refill: Capillary refill takes less than 2 seconds.     Findings: No rash.  Neurological:     Mental Status: She is alert and oriented to person, place, and time.     Cranial Nerves: No cranial nerve deficit.     Sensory: No sensory deficit.     Deep Tendon Reflexes: Reflexes are normal and symmetric.  Psychiatric:        Attention and Perception: Attention normal.        Mood and Affect: Mood normal.        Speech: Speech normal.     Wt Readings from Last 3 Encounters:  01/06/23 215 lb (97.5 kg)  01/01/23 196 lb (88.9 kg)  07/12/22 206 lb (93.4 kg)    BP 118/78   Pulse 74   Ht 5\' 6"  (1.676 m)   Wt 215 lb (97.5 kg)   SpO2 97%   BMI 34.70 kg/m    Assessment and Plan:  Problem List Items Addressed This Visit     Pre-diabetes (Chronic)    Blood sugar controlled with diet. Lab Results  Component Value Date   HGBA1C 6.4 (A) 06/11/2022        Relevant Orders   Hemoglobin A1c   Mixed hyperlipidemia    Mild hyperlipidemia managed with diet changes      Relevant Medications   losartan (COZAAR) 100 MG tablet   metoprolol tartrate (LOPRESSOR) 25 MG tablet   Other Relevant Orders   Lipid panel   Essential hypertension (Chronic)    Stable exam with well controlled BP.  Currently taking losartan, metoprolol, spironolactone. Tolerating medications without concerns or side effects. Will continue to recommend low sodium diet and current regimen.       Relevant Medications   losartan (COZAAR) 100 MG tablet   metoprolol tartrate (LOPRESSOR) 25 MG tablet   Other Relevant Orders   CBC with Differential/Platelet   Comprehensive metabolic panel   Chronic atrial fibrillation (HCC) (Chronic)    Rate controlled by metoprolol Anticoagulation with Xarelto.      Relevant Medications   losartan (COZAAR) 100 MG tablet   metoprolol tartrate (LOPRESSOR) 25 MG tablet   Acquired thrombophilia (HCC) (Chronic)    No bleeding  issues noted on Xaretlo      Relevant Orders   CBC with Differential/Platelet   Acquired hypothyroidism (Chronic)    supplemented      Relevant Medications   metoprolol tartrate (LOPRESSOR) 25 MG tablet   Other Relevant Orders   TSH + free T4   Other Visit Diagnoses     Annual physical exam    -  Primary   Encounter for screening mammogram for breast cancer       Relevant Orders   MM 3D SCREENING MAMMOGRAM BILATERAL BREAST   Contusion of left leg, subsequent encounter       keep lesion covered to avoid rupture elevate to limit edema       No follow-ups on file.   Partially dictated using Dragon software, any errors are not intentional.  Reubin Milan, MD [facility] [place], [place]

## 2023-01-06 NOTE — Patient Instructions (Signed)
Call [facility] to schedule your mammogram at [phone].  

## 2023-01-06 NOTE — Assessment & Plan Note (Signed)
Mild hyperlipidemia managed with diet changes

## 2023-01-06 NOTE — Assessment & Plan Note (Signed)
No bleeding issues noted on [place]

## 2023-01-06 NOTE — Assessment & Plan Note (Signed)
Blood sugar controlled with diet. Lab Results  Component Value Date   HGBA1C 6.4 (A) 06/11/2022

## 2023-01-07 LAB — LIPID PANEL
Chol/HDL Ratio: 2.8 ratio (ref 0.0–4.4)
Cholesterol, Total: 133 mg/dL (ref 100–199)
HDL: 48 mg/dL (ref >39)
LDL Chol Calc (NIH): 71 mg/dL (ref 0–99)
Triglycerides: 69 mg/dL (ref 0–149)
VLDL Cholesterol Cal: 14 mg/dL (ref 5–40)

## 2023-01-07 LAB — COMPREHENSIVE METABOLIC PANEL
ALT: 15 IU/L (ref 0–32)
AST: 13 IU/L (ref 0–40)
Albumin/Globulin Ratio: 1.8 (ref 1.2–2.2)
Albumin: 3.9 g/dL (ref 3.7–4.7)
Alkaline Phosphatase: 62 IU/L (ref 44–121)
BUN/Creatinine Ratio: 23 (ref 12–28)
BUN: 25 mg/dL (ref 8–27)
Bilirubin Total: 1.3 mg/dL — ABNORMAL HIGH (ref 0.0–1.2)
CO2: 23 mmol/L (ref 20–29)
Calcium: 9.2 mg/dL (ref 8.7–10.3)
Chloride: 93 mmol/L — ABNORMAL LOW (ref 96–106)
Creatinine, Ser: 1.08 mg/dL — ABNORMAL HIGH (ref 0.57–1.00)
Globulin, Total: 2.2 g/dL (ref 1.5–4.5)
Glucose: 114 mg/dL — ABNORMAL HIGH (ref 70–99)
Potassium: 5.7 mmol/L — ABNORMAL HIGH (ref 3.5–5.2)
Sodium: 129 mmol/L — ABNORMAL LOW (ref 134–144)
Total Protein: 6.1 g/dL (ref 6.0–8.5)
eGFR: 51 mL/min/{1.73_m2} — ABNORMAL LOW (ref >59)

## 2023-01-07 LAB — CBC WITH DIFFERENTIAL/PLATELET
Basophils Absolute: 0 10*3/uL (ref 0.0–0.2)
Basos: 0 %
EOS (ABSOLUTE): 0.9 10*3/uL — ABNORMAL HIGH (ref 0.0–0.4)
Eos: 9 %
Hematocrit: 36.3 % (ref 34.0–46.6)
Hemoglobin: 11.9 g/dL (ref 11.1–15.9)
Immature Grans (Abs): 0 10*3/uL (ref 0.0–0.1)
Immature Granulocytes: 0 %
Lymphocytes Absolute: 1.9 10*3/uL (ref 0.7–3.1)
Lymphs: 19 %
MCH: 29.9 pg (ref 26.6–33.0)
MCHC: 32.8 g/dL (ref 31.5–35.7)
MCV: 91 fL (ref 79–97)
Monocytes Absolute: 1.1 10*3/uL — ABNORMAL HIGH (ref 0.1–0.9)
Monocytes: 11 %
Neutrophils Absolute: 6.2 10*3/uL (ref 1.4–7.0)
Neutrophils: 61 %
Platelets: 270 10*3/uL (ref 150–450)
RBC: 3.98 x10E6/uL (ref 3.77–5.28)
RDW: 12.6 % (ref 11.7–15.4)
WBC: 10.1 10*3/uL (ref 3.4–10.8)

## 2023-01-07 LAB — TSH+FREE T4
Free T4: 1.74 ng/dL (ref 0.82–1.77)
TSH: 2.85 u[IU]/mL (ref 0.450–4.500)

## 2023-01-07 LAB — HEMOGLOBIN A1C
Est. average glucose Bld gHb Est-mCnc: 143 mg/dL
Hgb A1c MFr Bld: 6.6 % — ABNORMAL HIGH (ref 4.8–5.6)

## 2023-01-13 DIAGNOSIS — M5416 Radiculopathy, lumbar region: Secondary | ICD-10-CM | POA: Diagnosis not present

## 2023-01-13 DIAGNOSIS — M5442 Lumbago with sciatica, left side: Secondary | ICD-10-CM | POA: Diagnosis not present

## 2023-01-13 DIAGNOSIS — M25551 Pain in right hip: Secondary | ICD-10-CM | POA: Diagnosis not present

## 2023-01-13 DIAGNOSIS — G8929 Other chronic pain: Secondary | ICD-10-CM | POA: Diagnosis not present

## 2023-01-13 DIAGNOSIS — M5441 Lumbago with sciatica, right side: Secondary | ICD-10-CM | POA: Diagnosis not present

## 2023-01-14 DIAGNOSIS — G4733 Obstructive sleep apnea (adult) (pediatric): Secondary | ICD-10-CM | POA: Diagnosis not present

## 2023-02-14 ENCOUNTER — Encounter: Payer: Self-pay | Admitting: Internal Medicine

## 2023-02-14 ENCOUNTER — Ambulatory Visit (INDEPENDENT_AMBULATORY_CARE_PROVIDER_SITE_OTHER): Payer: Medicare Other | Admitting: Internal Medicine

## 2023-02-14 VITALS — BP 128/68 | HR 76 | Ht 66.0 in | Wt 210.0 lb

## 2023-02-14 DIAGNOSIS — L03116 Cellulitis of left lower limb: Secondary | ICD-10-CM | POA: Diagnosis not present

## 2023-02-14 MED ORDER — AMOXICILLIN-POT CLAVULANATE 875-125 MG PO TABS: 1.0000 | ORAL_TABLET | Freq: Two times a day (BID) | ORAL | 0 refills | Status: AC

## 2023-02-14 MED ORDER — SILVER SULFADIAZINE 1 % EX CREA: 1.0000 | TOPICAL_CREAM | Freq: Every day | CUTANEOUS | 0 refills | Status: DC

## 2023-02-14 NOTE — Progress Notes (Signed)
Date:  02/14/2023   Name:  Ariana Guzman   DOB:  Oct 29, 1940   MRN:  [id number]   Chief Complaint: Wound Check (Wound that started 1.5 months ago. Patient said wound is still not healed and is getting worse.)  Wound Check She was originally treated more than 14 days ago. Previous treatment included wound cleansing or irrigation. Maximum temperature: no fever. There has been clear discharge from the wound. The redness has worsened. The swelling has not changed. There is no pain present.    Lab Results  Component Value Date   NA 129 (L) 01/06/2023   K 5.7 (H) 01/06/2023   CO2 23 01/06/2023   GLUCOSE 114 (H) 01/06/2023   BUN 25 01/06/2023   CREATININE 1.08 (H) 01/06/2023   CALCIUM 9.2 01/06/2023   EGFR 51 (L) 01/06/2023   GFRNONAA 63 08/14/2020   Lab Results  Component Value Date   CHOL 133 01/06/2023   HDL 48 01/06/2023   LDLCALC 71 01/06/2023   TRIG 69 01/06/2023   CHOLHDL 2.8 01/06/2023   Lab Results  Component Value Date   TSH 2.850 01/06/2023   Lab Results  Component Value Date   HGBA1C 6.6 (H) 01/06/2023   Lab Results  Component Value Date   WBC 10.1 01/06/2023   HGB 11.9 01/06/2023   HCT 36.3 01/06/2023   MCV 91 01/06/2023   PLT 270 01/06/2023   Lab Results  Component Value Date   ALT 15 01/06/2023   AST 13 01/06/2023   ALKPHOS 62 01/06/2023   BILITOT 1.3 (H) 01/06/2023   No results found for: "25OHVITD2", "25OHVITD3", "VD25OH"   Review of Systems  Constitutional:  Negative for chills, fatigue and unexpected weight change.  Respiratory:  Negative for chest tightness and shortness of breath.   Cardiovascular:  Positive for leg swelling (has been unable to wear compression stockings). Negative for chest pain.  Skin:  Positive for wound.  Psychiatric/Behavioral:  Negative for dysphoric mood and sleep disturbance. The patient is not nervous/anxious.     Patient Active Problem List   Diagnosis Date Noted   Ganglion cyst of finger of right hand  07/12/2022   Primary osteoarthritis of left knee 06/11/2022   Spinal stenosis of lumbar region without neurogenic claudication 06/11/2022   Venous stasis dermatitis of both lower extremities 02/11/2022   Slow transit constipation 12/25/2020   Bursitis of hip 06/05/2020   Acquired thrombophilia (HCC) 06/05/2020   Hyponatremia 04/05/2020   Environmental and seasonal allergies 04/03/2020   Pre-diabetes 05/29/2018   Primary insomnia 09/15/2017   Generalized anxiety disorder 09/15/2017   Essential hypertension 09/09/2017   Varicose veins of both lower extremities with pain 09/09/2017   Bilateral lower extremity edema 09/09/2017   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/03/2015   Otitis externa, chronic 12/03/2015   Obstructive sleep apnea 10/07/2015   Asthma, persistent controlled 09/25/2015   Acquired hypothyroidism 02/29/2012   Chronic atrial fibrillation (HCC) 02/29/2012   Mixed hyperlipidemia 02/29/2012    Allergies  Allergen Reactions   Tramadol Shortness Of Breath   Meloxicam Rash    Past Surgical History:  Procedure Laterality Date   ABDOMINAL HYSTERECTOMY  1991   uterus only- still has cervix - done for prolapse   CATARACT EXTRACTION W/PHACO Left 11/24/2020   Procedure: CATARACT EXTRACTION PHACO AND INTRAOCULAR LENS PLACEMENT (IOC) LEFT 6.99 00:46.1;  Surgeon: Nevada Crane, MD;  Location: [facility];  Service: Ophthalmology;  Laterality: Left;   CATARACT EXTRACTION W/PHACO Right 12/08/2020  Procedure: CATARACT EXTRACTION PHACO AND INTRAOCULAR LENS PLACEMENT (IOC) RIGHT 5.13 00:34.2;  Surgeon: Nevada Crane, MD;  Location: [facility];  Service: Ophthalmology;  Laterality: Right;   COLONOSCOPY  2009   normal   TOE SURGERY      Social History   Tobacco Use   Smoking status: Former    Packs/day: 0.50    Years: 29.00    Additional pack years: 0.00    Total pack years: 14.50    Types: Cigarettes    Quit date: 09/28/1989     Years since quitting: 33.4   Smokeless tobacco: Never   Tobacco comments:    smoking cessation materials not required  Vaping Use   Vaping Use: Never used  Substance Use Topics   Alcohol use: No   Drug use: No     Medication list has been reviewed and updated.  Current Meds  Medication Sig   albuterol (PROVENTIL) (2.5 MG/3ML) 0.083% nebulizer solution Take 2.5 mg by nebulization every 6 (six) hours as needed.   albuterol (VENTOLIN HFA) 108 (90 Base) MCG/ACT inhaler Inhale 2 puffs into the lungs every 4 (four) hours as needed.   ALPRAZolam (XANAX) 0.25 MG tablet TAKE 1 TABLET BY MOUTH AT BEDTIME   amoxicillin-clavulanate (AUGMENTIN) 875-125 MG tablet Take 1 tablet by mouth 2 (two) times daily for 10 days.   Ascorbic Acid (VITAMIN C) 1000 MG tablet Take 1,000 mg by mouth daily.   baclofen (LIORESAL) 10 MG tablet Take 1 tablet (10 mg total) by mouth 3 (three) times daily.   Calcium Carb-Cholecalciferol [phone] MG-UNIT TABS Take by mouth.   celecoxib (CELEBREX) 200 MG capsule Take 200 mg by mouth daily.   cetirizine (ZYRTEC) 10 MG tablet Take 10 mg by mouth 2 (two) times daily.   cholecalciferol (VITAMIN D) 1000 units tablet Take 1,000 Units by mouth daily.   clotrimazole-betamethasone (LOTRISONE) cream Apply 1 application topically 2 (two) times daily. To rash under breast   Fluticasone-Umeclidin-Vilant (TRELEGY ELLIPTA) 200-62.5-25 MCG/ACT AEPB Inhale into the lungs.   ipratropium (ATROVENT) 0.06 % nasal spray Place 2 sprays into both nostrils 4 (four) times daily.   latanoprost (XALATAN) 0.005 % ophthalmic solution 1 drop at bedtime.   levothyroxine (SYNTHROID) 75 MCG tablet TAKE 1 TABLET BY MOUTH ONCE DAILY BEFORE BREAKFAST   losartan (COZAAR) 100 MG tablet Take 1 tablet (100 mg total) by mouth daily.   Magnesium 100 MG CAPS Take by mouth.   MELATONIN PO Take by mouth.   metoprolol tartrate (LOPRESSOR) 25 MG tablet TAKE 1 TABLET BY MOUTH IN THE MORNING AND AT NOON AND AT BEDTIME -  TAKE 1 TABLET THREE TIMES DAILY   montelukast (SINGULAIR) 10 MG tablet Take 10 mg by mouth daily.   Multiple Vitamins-Minerals (OCUVITE EYE HEATLH GUMMIES PO) Take by mouth daily.   Omega-3 Fatty Acids (FISH OIL PO) Take by mouth.   Rivaroxaban (XARELTO) 15 MG TABS tablet Take 15 mg by mouth daily with supper.    silver sulfADIAZINE (SILVADENE) 1 % cream Apply 1 Application topically daily.   spironolactone (ALDACTONE) 25 MG tablet Take 1 tablet by mouth twice daily       02/14/2023    1:44 PM 01/06/2023   10:42 AM 07/12/2022    3:40 PM 02/11/2022    9:21 AM  GAD 7 : Generalized Anxiety Score  Nervous, Anxious, on Edge  0 2 3  Control/stop worrying 0 0 2 2  Worry too much - different things 0 0 2 2  Trouble relaxing 0 0 0 1  Restless 0 0 0 2  Easily annoyed or irritable 0 0 0 1  Afraid - awful might happen 0 0 0 0  Total GAD 7 Score  0 6 11  Anxiety Difficulty Not difficult at all Not difficult at all Not difficult at all Very difficult       02/14/2023    1:44 PM 01/06/2023   10:42 AM 07/12/2022    3:40 PM  Depression screen PHQ 2/9  Decreased Interest 0 0 0  Down, Depressed, Hopeless 0 0 0  PHQ - 2 Score 0 0 0  Altered sleeping 0 0 0  Tired, decreased energy 0 2 2  Change in appetite 0 0 0  Feeling bad or failure about yourself  0 0 0  Trouble concentrating 0 0 0  Moving slowly or fidgety/restless 0 0 0  Suicidal thoughts 0 0 0  PHQ-9 Score 0 2 2  Difficult doing work/chores Not difficult at all Not difficult at all Not difficult at all    BP Readings from Last 3 Encounters:  02/14/23 128/68  01/06/23 118/78  01/01/23 (!) 161/68    Physical Exam Vitals and nursing note reviewed.  Constitutional:      General: She is not in acute distress.    Appearance: She is well-developed.  HENT:     Head: Normocephalic and atraumatic.  Cardiovascular:     Rate and Rhythm: Normal rate and regular rhythm.  Pulmonary:     Effort: Pulmonary effort is normal. No respiratory  distress.     Breath sounds: No wheezing or rhonchi.  Musculoskeletal:     Cervical back: Normal range of motion.     Right lower leg: Edema present.     Left lower leg: Edema present.  Skin:    General: Skin is warm and dry.     Findings: Lesion present. No rash.     Comments: Shallow ulcer with exudate; surrounding skin with clear serous weeping, red and tender.  No bleeding or odor.  Neurological:     Mental Status: She is alert and oriented to person, place, and time.  Psychiatric:        Mood and Affect: Mood normal.        Behavior: Behavior normal.       Wt Readings from Last 3 Encounters:  02/14/23 210 lb (95.3 kg)  01/06/23 215 lb (97.5 kg)  01/01/23 196 lb (88.9 kg)    BP 128/68   Pulse 76   Ht 5\' 6"  (1.676 m)   Wt 210 lb (95.3 kg)   SpO2 97%   BMI 33.89 kg/m   Assessment and Plan:  Problem List Items Addressed This Visit   None Visit Diagnoses     Cellulitis of left lower extremity    -  Primary   Cleanse wound with only warm water apply Silvadene ointment daily and cover with non stick dressing elevate as much as possible follow up if not improving.   Relevant Medications   amoxicillin-clavulanate (AUGMENTIN) 875-125 MG tablet   silver sulfADIAZINE (SILVADENE) 1 % cream       No follow-ups on file.   Partially dictated using Dragon software, any errors are not intentional.  Reubin Milan, MD [facility] [place], [place]

## 2023-02-14 NOTE — Patient Instructions (Addendum)
Clean only with warm water - no soap, peroxide or alcohol - and pat dry.  Apply Silvadene ointment and cover with a non stick bandage.  Replace the bandage if it becomes too moist.  Elevate your legs as much as possible.  Follow up if not improving once antibiotics are complete.

## 2023-02-15 DIAGNOSIS — M2042 Other hammer toe(s) (acquired), left foot: Secondary | ICD-10-CM | POA: Diagnosis not present

## 2023-02-15 DIAGNOSIS — L84 Corns and callosities: Secondary | ICD-10-CM | POA: Diagnosis not present

## 2023-02-15 DIAGNOSIS — L851 Acquired keratosis [keratoderma] palmaris et plantaris: Secondary | ICD-10-CM | POA: Diagnosis not present

## 2023-02-15 DIAGNOSIS — L03116 Cellulitis of left lower limb: Secondary | ICD-10-CM | POA: Diagnosis not present

## 2023-02-15 DIAGNOSIS — M2041 Other hammer toe(s) (acquired), right foot: Secondary | ICD-10-CM | POA: Diagnosis not present

## 2023-02-15 DIAGNOSIS — L97921 Non-pressure chronic ulcer of unspecified part of left lower leg limited to breakdown of skin: Secondary | ICD-10-CM | POA: Diagnosis not present

## 2023-02-15 DIAGNOSIS — M792 Neuralgia and neuritis, unspecified: Secondary | ICD-10-CM | POA: Diagnosis not present

## 2023-02-15 DIAGNOSIS — I739 Peripheral vascular disease, unspecified: Secondary | ICD-10-CM | POA: Diagnosis not present

## 2023-02-15 DIAGNOSIS — I83893 Varicose veins of bilateral lower extremities with other complications: Secondary | ICD-10-CM | POA: Diagnosis not present

## 2023-02-15 DIAGNOSIS — B351 Tinea unguium: Secondary | ICD-10-CM | POA: Diagnosis not present

## 2023-02-15 DIAGNOSIS — M21621 Bunionette of right foot: Secondary | ICD-10-CM | POA: Diagnosis not present

## 2023-02-15 DIAGNOSIS — M2011 Hallux valgus (acquired), right foot: Secondary | ICD-10-CM | POA: Diagnosis not present

## 2023-02-15 DIAGNOSIS — I890 Lymphedema, not elsewhere classified: Secondary | ICD-10-CM | POA: Diagnosis not present

## 2023-02-21 DIAGNOSIS — I83029 Varicose veins of left lower extremity with ulcer of unspecified site: Secondary | ICD-10-CM | POA: Diagnosis not present

## 2023-02-21 DIAGNOSIS — M21622 Bunionette of left foot: Secondary | ICD-10-CM | POA: Diagnosis not present

## 2023-02-21 DIAGNOSIS — L97929 Non-pressure chronic ulcer of unspecified part of left lower leg with unspecified severity: Secondary | ICD-10-CM | POA: Diagnosis not present

## 2023-02-21 DIAGNOSIS — M21621 Bunionette of right foot: Secondary | ICD-10-CM | POA: Diagnosis not present

## 2023-03-01 DIAGNOSIS — L97921 Non-pressure chronic ulcer of unspecified part of left lower leg limited to breakdown of skin: Secondary | ICD-10-CM | POA: Diagnosis not present

## 2023-03-03 ENCOUNTER — Ambulatory Visit
Admission: RE | Admit: 2023-03-03 | Discharge: 2023-03-03 | Disposition: A | Discharge status: Home / Self Care | Payer: Medicare Other | Source: Ambulatory Visit | Attending: Internal Medicine | Admitting: Internal Medicine

## 2023-03-03 DIAGNOSIS — Z1231 Encounter for screening mammogram for malignant neoplasm of breast: Secondary | ICD-10-CM | POA: Insufficient documentation

## 2023-03-08 DIAGNOSIS — L97921 Non-pressure chronic ulcer of unspecified part of left lower leg limited to breakdown of skin: Secondary | ICD-10-CM | POA: Diagnosis not present

## 2023-03-08 DIAGNOSIS — I83029 Varicose veins of left lower extremity with ulcer of unspecified site: Secondary | ICD-10-CM | POA: Diagnosis not present

## 2023-03-10 ENCOUNTER — Other Ambulatory Visit: Payer: Self-pay | Admitting: Internal Medicine

## 2023-03-10 DIAGNOSIS — F5101 Primary insomnia: Secondary | ICD-10-CM

## 2023-03-11 NOTE — Telephone Encounter (Signed)
Please review.  KP

## 2023-03-11 NOTE — Telephone Encounter (Signed)
Requested medication (s) are due for refill today - yes  Requested medication (s) are on the active medication list -yes  Future visit scheduled -yes  Last refill: 09/08/22 #30 5RF  Notes to clinic: non delegated Rx  Requested Prescriptions  Pending Prescriptions Disp Refills   ALPRAZolam (XANAX) 0.25 MG tablet [Pharmacy Med Name: ALPRAZOLAM 0.25MG    TAB] 30 tablet 0    Sig: TAKE 1 TABLET BY MOUTH AT BEDTIME     Not Delegated - Psychiatry: Anxiolytics/Hypnotics 2 Failed - 03/10/2023  6:21 PM      Failed - This refill cannot be delegated      Failed - Urine Drug Screen completed in last 360 days      Passed - Patient is not pregnant      Passed - Valid encounter within last 6 months    Recent Outpatient Visits           3 weeks ago Cellulitis of left lower extremity   [place] [facility] at [facility], Nyoka Cowden, MD   2 months ago Annual physical exam   [facility] at [facility], Nyoka Cowden, MD   8 months ago Venous stasis dermatitis of both lower extremities   [place] [facility] at [facility], Nyoka Cowden, MD   8 months ago Acute frontal sinusitis, recurrence not specified   [place] [facility] at [facility], Ocie Bob, MD   9 months ago Essential hypertension   [place] [facility] at [facility], Nyoka Cowden, MD       Future Appointments             In 1 month Judithann Graves Nyoka Cowden, MD [facility] at [facility], [facility]   In 10 months Reubin Milan, MD [facility] at [facility], [facility]               Requested Prescriptions  Pending Prescriptions Disp Refills   ALPRAZolam (XANAX) 0.25 MG tablet [Pharmacy Med Name: ALPRAZOLAM 0.25MG    TAB] 30 tablet 0    Sig: TAKE 1 TABLET BY MOUTH AT BEDTIME     Not Delegated -  Psychiatry: Anxiolytics/Hypnotics 2 Failed - 03/10/2023  6:21 PM      Failed - This refill cannot be delegated      Failed - Urine Drug Screen completed in last 360 days      Passed - Patient is not pregnant      Passed - Valid encounter within last 6 months    Recent Outpatient Visits           3 weeks ago Cellulitis of left lower extremity   [place] [facility] at [facility], Nyoka Cowden, MD   2 months ago Annual physical exam   [facility] at [facility], Nyoka Cowden, MD   8 months ago Venous stasis dermatitis of both lower extremities   [place] [facility] at [facility], Nyoka Cowden, MD   8 months ago Acute frontal sinusitis, recurrence not specified   [facility] at [facility], Ocie Bob, MD   9 months ago Essential hypertension   [facility] at [facility],  Nyoka Cowden, MD       Future Appointments             In 1 month Judithann Graves Nyoka Cowden, MD [facility] at [facility], [place]   In 10 months Judithann Graves, Nyoka Cowden, MD [facility] Medicine at [facility], [facility]

## 2023-03-14 ENCOUNTER — Other Ambulatory Visit: Payer: Self-pay | Admitting: Internal Medicine

## 2023-03-14 DIAGNOSIS — E039 Hypothyroidism, unspecified: Secondary | ICD-10-CM

## 2023-03-15 DIAGNOSIS — L97921 Non-pressure chronic ulcer of unspecified part of left lower leg limited to breakdown of skin: Secondary | ICD-10-CM | POA: Diagnosis not present

## 2023-03-15 DIAGNOSIS — I83028 Varicose veins of left lower extremity with ulcer other part of lower leg: Secondary | ICD-10-CM | POA: Diagnosis not present

## 2023-03-21 ENCOUNTER — Other Ambulatory Visit: Payer: Self-pay | Admitting: Internal Medicine

## 2023-03-21 DIAGNOSIS — I10 Essential (primary) hypertension: Secondary | ICD-10-CM

## 2023-03-22 DIAGNOSIS — I83029 Varicose veins of left lower extremity with ulcer of unspecified site: Secondary | ICD-10-CM | POA: Diagnosis not present

## 2023-03-22 DIAGNOSIS — L97922 Non-pressure chronic ulcer of unspecified part of left lower leg with fat layer exposed: Secondary | ICD-10-CM | POA: Diagnosis not present

## 2023-03-29 DIAGNOSIS — L97921 Non-pressure chronic ulcer of unspecified part of left lower leg limited to breakdown of skin: Secondary | ICD-10-CM | POA: Diagnosis not present

## 2023-04-05 DIAGNOSIS — I83029 Varicose veins of left lower extremity with ulcer of unspecified site: Secondary | ICD-10-CM | POA: Diagnosis not present

## 2023-04-05 DIAGNOSIS — L97921 Non-pressure chronic ulcer of unspecified part of left lower leg limited to breakdown of skin: Secondary | ICD-10-CM | POA: Diagnosis not present

## 2023-04-05 LAB — HM DIABETES FOOT EXAM

## 2023-04-08 ENCOUNTER — Encounter: Payer: Medicare Other | Attending: Physician Assistant | Admitting: Physician Assistant

## 2023-04-08 DIAGNOSIS — I890 Lymphedema, not elsewhere classified: Secondary | ICD-10-CM | POA: Insufficient documentation

## 2023-04-08 DIAGNOSIS — L97822 Non-pressure chronic ulcer of other part of left lower leg with fat layer exposed: Secondary | ICD-10-CM | POA: Insufficient documentation

## 2023-04-08 DIAGNOSIS — I739 Peripheral vascular disease, unspecified: Secondary | ICD-10-CM | POA: Diagnosis not present

## 2023-04-08 DIAGNOSIS — Z7901 Long term (current) use of anticoagulants: Secondary | ICD-10-CM | POA: Diagnosis not present

## 2023-04-08 DIAGNOSIS — I10 Essential (primary) hypertension: Secondary | ICD-10-CM | POA: Insufficient documentation

## 2023-04-08 DIAGNOSIS — D6869 Other thrombophilia: Secondary | ICD-10-CM | POA: Diagnosis not present

## 2023-04-08 DIAGNOSIS — S51811A Laceration without foreign body of right forearm, initial encounter: Secondary | ICD-10-CM | POA: Diagnosis not present

## 2023-04-08 DIAGNOSIS — G629 Polyneuropathy, unspecified: Secondary | ICD-10-CM | POA: Insufficient documentation

## 2023-04-08 DIAGNOSIS — I480 Paroxysmal atrial fibrillation: Secondary | ICD-10-CM | POA: Insufficient documentation

## 2023-04-08 DIAGNOSIS — I87332 Chronic venous hypertension (idiopathic) with ulcer and inflammation of left lower extremity: Secondary | ICD-10-CM | POA: Diagnosis not present

## 2023-04-08 DIAGNOSIS — I872 Venous insufficiency (chronic) (peripheral): Secondary | ICD-10-CM | POA: Insufficient documentation

## 2023-04-08 DIAGNOSIS — S81802A Unspecified open wound, left lower leg, initial encounter: Secondary | ICD-10-CM | POA: Diagnosis not present

## 2023-04-08 NOTE — Progress Notes (Addendum)
AMYRIAH, GARTRELL ([id number]) [id number] Page 1 of 12 Visit Report for 04/08/2023 Allergy List Details Patient Name: Date of Service: Ariana Guzman 04/08/2023 11:30 A M Medical Record Number: [id number] Patient Account Number: [id number] Date of Birth/Sex: Treating RN: Jan 29, 1941 (82 y.o. Ariana Guzman Primary Care Jonathen Rathman: Bari Edward Other Clinician: Referring Averianna Brugger: Treating Brighten Buzzelli/Extender: Silvestre Moment in Treatment: 0 Allergies Active Allergies No Known Allergies Allergy Notes Electronic Signature(s) Signed: 04/08/2023 1:24:46 PM By: Midge Aver MSN RN CNS WTA Entered By: Midge Aver on 04/08/2023 08:33:02 -------------------------------------------------------------------------------- Arrival Information Details Patient Name: Date of Service: Ariana Guzman, Ariana Rathke Guzman. 04/08/2023 11:30 A M Medical Record Number: [id number] Patient Account Number: [id number] Date of Birth/Sex: Treating RN: Apr 28, 1941 (81 y.o. Ariana Guzman Primary Care Esha Fincher: Bari Edward Other Clinician: Referring Tolbert Matheson: Treating Meldrick Buttery/Extender: Silvestre Moment in Treatment: 0 Visit Information Patient Arrived: Ariana Guzman Time: 11:23 Accompanied By: son Transfer Assistance: None Patient Identification Verified: Yes Secondary Verification Process Completed: Yes Patient Requires Transmission-Based Precautions: No Patient Has Alerts: Yes Patient Alerts: Patient on Blood Thinner Xarelto Electronic Signature(s) Signed: 04/08/2023 1:24:46 PM By: Midge Aver MSN RN CNS [address] Jerrye Beavers Guzman ([id number]) [id number] Page 2 of 12 Entered By: Midge Aver on 04/08/2023 08:28:09 -------------------------------------------------------------------------------- Clinic Level of Care Assessment Details Patient Name: Date of Service: Ariana [name] 04/08/2023 11:30 A M Medical Record Number:  [id number] Patient Account Number: [id number] Date of Birth/Sex: Treating RN: [birth year] (82 y.o. Ariana Guzman Primary Care Rashada Klontz: Bari Edward Other Clinician: Referring Jancy Sprankle: Treating Seraphine Gudiel/Extender: Silvestre Moment in Treatment: 0 Clinic Level of Care Assessment Items TOOL 2 Quantity Score X- 1 0 Use when only an EandM is performed on the INITIAL visit ASSESSMENTS - Nursing Assessment / Reassessment X- 1 20 General Physical Exam (combine w/ comprehensive assessment (listed just below) when performed on new pt. evals) X- 1 25 Comprehensive Assessment (HX, ROS, Risk Assessments, Wounds Hx, etc.) ASSESSMENTS - Wound and Skin A ssessment / Reassessment []  - 0 Simple Wound Assessment / Reassessment - one wound X- 2 5 Complex Wound Assessment / Reassessment - multiple wounds []  - 0 Dermatologic / Skin Assessment (not related to wound area) ASSESSMENTS - Ostomy and/or Continence Assessment and Care []  - 0 Incontinence Assessment and Management []  - 0 Ostomy Care Assessment and Management (repouching, etc.) PROCESS - Coordination of Care []  - 0 Simple Patient / Family Education for ongoing care X- 1 20 Complex (extensive) Patient / Family Education for ongoing care X- 1 10 Staff obtains Chiropractor, Records, T Results / Process Orders est []  - 0 Staff telephones HHA, Nursing Homes / Clarify orders / etc []  - 0 Routine Transfer to another Facility (non-emergent condition) []  - 0 Routine Hospital Admission (non-emergent condition) []  - 0 New Admissions / Manufacturing engineer / Ordering NPWT Apligraf, etc. , X- 1 20 Emergency Hospital Admission (emergent condition) []  - 0 Simple Discharge Coordination X- 1 15 Complex (extensive) Discharge Coordination PROCESS - Special Needs []  - 0 Pediatric / Minor Patient Management []  - 0 Isolation Patient Management []  - 0 Hearing / Language / Visual special needs []  - 0 Assessment of Community  assistance (transportation, D/C planning, etc.) []  - 0 Additional assistance / Altered mentation []  - 0 Support Surface(s) Assessment (bed, cushion, seat, etc.) Ariana Guzman, Ariana Guzman ([id number]) [phone].pdf Page 3 of 12 INTERVENTIONS - Wound Cleansing / Measurement X- 1 5 Wound Imaging (photographs - any number of wounds) []  -  0 Wound Tracing (instead of photographs) []  - 0 Simple Wound Measurement - one wound X- 2 5 Complex Wound Measurement - multiple wounds []  - 0 Simple Wound Cleansing - one wound X- 2 5 Complex Wound Cleansing - multiple wounds INTERVENTIONS - Wound Dressings X - Small Wound Dressing one or multiple wounds 2 10 []  - 0 Medium Wound Dressing one or multiple wounds []  - 0 Large Wound Dressing one or multiple wounds []  - 0 Application of Medications - injection INTERVENTIONS - Miscellaneous []  - 0 External ear exam []  - 0 Specimen Collection (cultures, biopsies, blood, body fluids, etc.) []  - 0 Specimen(s) / Culture(s) sent or taken to Lab for analysis []  - 0 Patient Transfer (multiple staff / Nurse, adult / Similar devices) []  - 0 Simple Staple / Suture removal (25 or less) []  - 0 Complex Staple / Suture removal (26 or more) []  - 0 Hypo / Hyperglycemic Management (close monitor of Blood Glucose) X- 1 15 Ankle / Brachial Index (ABI) - do not check if billed separately Has the patient been seen at the hospital within the last three years: Yes Total Score: 180 Level Of Care: New/Established - Level 5 Electronic Signature(s) Signed: 04/08/2023 1:24:46 PM By: Midge Aver MSN RN CNS WTA Entered By: Midge Aver on 04/08/2023 09:34:12 -------------------------------------------------------------------------------- Compression Therapy Details Patient Name: Date of Service: Ariana Guzman, Ariana Rathke Guzman. 04/08/2023 11:30 A M Medical Record Number: [id number] Patient Account Number: [id number] Date of Birth/Sex: Treating RN: [birth year] (82 y.o. Ariana Guzman Primary Care Mckinley Adelstein: Bari Edward Other Clinician: Referring Cyndie Woodbeck: Treating Salli Bodin/Extender: Silvestre Moment in Treatment: 0 Compression Therapy Performed for Wound Assessment: Wound #1 Left,Medial Lower Leg Performed By: Clinician Midge Aver, RN Compression Type: Three Layer Post Procedure Diagnosis Same as Pre-procedure Electronic Signature(s) Signed: 04/08/2023 1:24:46 PM By: Midge Aver MSN RN CNS [address], Jerrye Beavers Guzman ([id number]) [id number] Page 4 of 12 Entered By: Midge Aver on 04/08/2023 09:30:09 -------------------------------------------------------------------------------- Encounter Discharge Information Details Patient Name: Date of Service: Ariana [name] 04/08/2023 11:30 A M Medical Record Number: [id number] Patient Account Number: [id number] Date of Birth/Sex: Treating RN: 1940/09/02 (82 y.o. Ariana Guzman Primary Care Arraya Buck: Bari Edward Other Clinician: Referring Johna Kearl: Treating Jaqualin Serpa/Extender: Silvestre Moment in Treatment: 0 Encounter Discharge Information Items Discharge Condition: Stable Ambulatory Status: Walker Discharge Destination: Home Transportation: Private Auto Accompanied By: son Schedule Follow-up Appointment: Yes Clinical Summary of Care: Electronic Signature(s) Signed: 04/08/2023 1:37:56 PM By: Midge Aver MSN RN CNS WTA Previous Signature: 04/08/2023 1:24:46 PM Version By: Midge Aver MSN RN CNS WTA Entered By: Midge Aver on 04/08/2023 10:37:56 -------------------------------------------------------------------------------- Lower Extremity Assessment Details Patient Name: Date of Service: Ariana Guzman, Ariana Rathke Guzman. 04/08/2023 11:30 A M Medical Record Number: [id number] Patient Account Number: [id number] Date of Birth/Sex: Treating RN: 02-18-1941 (82 y.o. Ariana Guzman Primary Care Knute Mazzuca: Bari Edward Other Clinician: Referring  Keigo Whalley: Treating Willaim Mode/Extender: Silvestre Moment in Treatment: 0 Edema Assessment Assessed: [Left: No] [Right: No] [Left: Edema] [Right: :] Calf Left: Right: Point of Measurement: 36 cm From Medial Instep 41.5 cm Ankle Left: Right: Point of Measurement: 13 cm From Medial Instep 22 cm Ariana Guzman, Ariana Guzman ([id number]) [id number] Page 5 of 12 Knee To Floor Left: Right: From Medial Instep 39 cm Vascular Assessment Pulses: Dorsalis Pedis Palpable: [Left:Yes] Extremity colors, hair growth, and conditions: Extremity Color: [Left:Normal] Hair Growth on Extremity: [Left:No] Temperature of Extremity: [Left:Warm] Capillary Refill: [Left:< 3 seconds] Dependent Rubor: [  Left:No] Blanched when Elevated: [Left:No] Lipodermatosclerosis: [Left:No] Blood Pressure: Brachial: [Left:137] Ankle: [Left:Dorsalis Pedis: 90 0.66] Toe Nail Assessment Left: Right: Thick: Yes Discolored: Yes Deformed: Yes Improper Length and Hygiene: Yes Electronic Signature(s) Signed: 04/08/2023 1:24:46 PM By: Midge Aver MSN RN CNS WTA Entered By: Midge Aver on 04/08/2023 09:07:28 -------------------------------------------------------------------------------- Multi Wound Chart Details Patient Name: Date of Service: Ariana Guzman, Ariana Rathke Guzman. 04/08/2023 11:30 A M Medical Record Number: [id number] Patient Account Number: [id number] Date of Birth/Sex: Treating RN: June 27, 1941 (82 y.o. Ariana Guzman Primary Care Tayla Panozzo: Bari Edward Other Clinician: Referring Trejon Duford: Treating Maebry Obrien/Extender: Silvestre Moment in Treatment: 0 Vital Signs Height(in): 66 Pulse(bpm): 62 Weight(lbs): Blood Pressure(mmHg): 137/69 Body Mass Index(BMI): Temperature(F): 97.7 Respiratory Rate(breaths/min): 18 [1:Photos:] [N/A:N/A [id number] Page 6 of 12] Left, Medial Lower Leg Right, Posterior Forearm N/A Wound Location: Trauma Skin T  ear/Laceration N/A Wounding Event: Trauma, Other Skin T ear N/A Primary Etiology: Aspiration, Hypertension, Neuropathy Aspiration, Hypertension, Neuropathy N/A Comorbid History: 12/31/2022 03/31/2023 N/A Date Acquired: 0 0 N/A Weeks of Treatment: Open Open N/A Wound Status: No No N/A Wound Recurrence: 2.5x0.8x0.2 5.5x0.2x0.1 N/A Measurements L x W x D (cm) 1.571 0.864 N/A A (cm) : rea 0.314 0.086 N/A Volume (cm) : Full Thickness Without Exposed Full Thickness Without Exposed N/A Classification: Support Structures Support Structures Medium Medium N/A Exudate Amount: Serosanguineous Serosanguineous N/A Exudate Type: red, brown red, brown N/A Exudate Color: Small (1-33%) Small (1-33%) N/A Granulation Amount: Red, Pink Red, Pink N/A Granulation Quality: Medium (34-66%) None Present (0%) N/A Necrotic Amount: Fat Layer (Subcutaneous Tissue): Yes Fascia: No N/A Exposed Structures: Fascia: No Fat Layer (Subcutaneous Tissue): No Tendon: No Tendon: No Muscle: No Muscle: No Joint: No Joint: No Bone: No Bone: No N/A Small (1-33%) N/A Epithelialization: Treatment Notes Electronic Signature(s) Signed: 04/08/2023 1:24:46 PM By: Midge Aver MSN RN CNS WTA Entered By: Midge Aver on 04/08/2023 09:26:01 -------------------------------------------------------------------------------- Multi-Disciplinary Care Plan Details Patient Name: Date of Service: Ariana Guzman, Ariana Rathke Guzman. 04/08/2023 11:30 A M Medical Record Number: [id number] Patient Account Number: [id number] Date of Birth/Sex: Treating RN: [birth year] (82 y.o. Ariana Guzman Primary Care Kevonna Nolte: Bari Edward Other Clinician: Referring Matix Henshaw: Treating Lekia Nier/Extender: Silvestre Moment in Treatment: 0 Active Inactive Necrotic Tissue Nursing Diagnoses: Impaired tissue integrity related to necrotic/devitalized tissue Knowledge deficit related to management of necrotic/devitalized  tissue Goals: Necrotic/devitalized tissue will be minimized in the wound bed Date Initiated: 04/08/2023 Target Resolution Date: 05/09/2023 Goal Status: Active Patient/caregiver will verbalize understanding of reason and process for debridement of necrotic tissue Date Initiated: 04/08/2023 Target Resolution Date: 04/15/2023 Goal Status: Active Interventions: Assess patient pain level pre-, during and post procedure and prior to discharge Provide education on necrotic tissue and debridement process Ariana Guzman, Ariana Guzman ([id number]) [id number] Page 7 of 12 Treatment Activities: Apply topical anesthetic as ordered : 04/08/2023 Excisional debridement : 04/08/2023 Notes: Orientation to the Wound Care Program Nursing Diagnoses: Knowledge deficit related to the wound healing center program Goals: Patient/caregiver will verbalize understanding of the Wound Healing Center Program Date Initiated: 04/08/2023 Target Resolution Date: 04/15/2023 Goal Status: Active Interventions: Provide education on orientation to the wound center Notes: Venous Leg Ulcer Nursing Diagnoses: Knowledge deficit related to disease process and management Potential for venous Insuffiency (use before diagnosis confirmed) Goals: Patient will maintain optimal edema control Date Initiated: 04/08/2023 Target Resolution Date: 05/09/2023 Goal Status: Active Patient/caregiver will verbalize understanding of disease process and disease management Date Initiated: 04/08/2023 Target Resolution Date: 05/09/2023 Goal Status: Active Verify  adequate tissue perfusion prior to therapeutic compression application Date Initiated: 04/08/2023 Target Resolution Date: 04/22/2023 Goal Status: Active Interventions: Assess peripheral edema status every visit. Compression as ordered Provide education on venous insufficiency Treatment Activities: Non-invasive vascular studies : 04/08/2023 T ordered outside of clinic :  04/08/2023 est Therapeutic compression applied : 04/08/2023 Notes: Wound/Skin Impairment Nursing Diagnoses: Impaired tissue integrity Knowledge deficit related to ulceration/compromised skin integrity Goals: Patient/caregiver will verbalize understanding of skin care regimen Date Initiated: 04/08/2023 Target Resolution Date: 05/09/2023 Goal Status: Active Ulcer/skin breakdown will have a volume reduction of 30% by week 4 Date Initiated: 04/08/2023 Target Resolution Date: 05/09/2023 Goal Status: Active Ulcer/skin breakdown will have a volume reduction of 50% by week 8 Date Initiated: 04/08/2023 Target Resolution Date: 06/08/2023 Goal Status: Active Ulcer/skin breakdown will have a volume reduction of 80% by week 12 Date Initiated: 04/08/2023 Target Resolution Date: 07/09/2023 Goal Status: Active Ulcer/skin breakdown will heal within 14 weeks Date Initiated: 04/08/2023 Target Resolution Date: 07/23/2023 Goal Status: Active Interventions: Assess patient/caregiver ability to obtain necessary supplies Assess patient/caregiver ability to perform ulcer/skin care regimen upon admission and as needed Ariana Guzman, Ariana Guzman ([id number]) [id number] Page 8 of 12 Assess ulceration(s) every visit Provide education on ulcer and skin care Treatment Activities: Skin care regimen initiated : 04/08/2023 Notes: Electronic Signature(s) Signed: 04/08/2023 1:36:35 PM By: Midge Aver MSN RN CNS WTA Previous Signature: 04/08/2023 1:36:26 PM Version By: Midge Aver MSN RN CNS WTA Entered By: Midge Aver on 04/08/2023 10:36:35 -------------------------------------------------------------------------------- Pain Assessment Details Patient Name: Date of Service: Ariana Guzman, Ariana Rathke Guzman. 04/08/2023 11:30 A M Medical Record Number: [id number] Patient Account Number: [id number] Date of Birth/Sex: Treating RN: Feb 02, 1941 (82 y.o. Ariana Guzman Primary Care Steffani Dionisio: Bari Edward Other  Clinician: Referring Keniah Klemmer: Treating Katti Pelle/Extender: Silvestre Moment in Treatment: 0 Active Problems Location of Pain Severity and Description of Pain Patient Has Paino No Site Locations Pain Management and Medication Current Pain Management: Electronic Signature(s) Signed: 04/08/2023 1:24:46 PM By: Midge Aver MSN RN CNS WTA Entered By: Midge Aver on 04/08/2023 08:29:58 Ariana Guzman ([id number]) [id number] Page 9 of 12 -------------------------------------------------------------------------------- Patient/Caregiver Education Details Patient Name: Date of Service: Ariana Guzman, Ariana Guzman 8/23/2024andnbsp11:30 A M Medical Record Number: [id number] Patient Account Number: [id number] Date of Birth/Gender: Treating RN: 06/04/1941 (82 y.o. Ariana Guzman Primary Care Physician: Bari Edward Other Clinician: Referring Physician: Treating Physician/Extender: Silvestre Moment in Treatment: 0 Education Assessment Education Provided To: Patient Education Topics Provided Welcome [facility] Packet: o Handouts: Welcome T [facility] o Methods: Explain/Verbal Responses: State content correctly Wound/Skin Impairment: Handouts: Caring for Your Ulcer Methods: Explain/Verbal Responses: State content correctly Electronic Signature(s) Signed: 04/14/2023 5:49:25 PM By: Midge Aver MSN RN CNS WTA Entered By: Midge Aver on 04/08/2023 10:37:11 -------------------------------------------------------------------------------- Wound Assessment Details Patient Name: Date of Service: Ariana Guzman, Ariana Rathke Guzman. 04/08/2023 11:30 A M Medical Record Number: [id number] Patient Account Number: [id number] Date of Birth/Sex: Treating RN: [birth year] (82 y.o. Ariana Guzman Primary Care Laquon Emel: Bari Edward Other Clinician: Referring Zyniah Ferraiolo: Treating Hashem Goynes/Extender: Silvestre Moment in  Treatment: 0 Wound Status Wound Number: 1 Primary Etiology: Trauma, Other Wound Location: Left, Medial Lower Leg Wound Status: Open Wounding Event: Trauma Comorbid History: Aspiration, Hypertension, Neuropathy Date Acquired: 12/31/2022 Weeks Of Treatment: 0 Clustered Wound: No Ariana Guzman, Ariana Guzman ([id number]) [id number] Page 10 of 12 Photos Wound Measurements Length: (cm) 2.5 Width: (cm) 0.8 Depth: (cm) 0.2 Area: (cm) 1.571  Volume: (cm) 0.314 % Reduction in Area: % Reduction in Volume: Tunneling: No Undermining: No Wound Description Classification: Full Thickness Without Exposed Suppor Exudate Amount: Medium Exudate Type: Serosanguineous Exudate Color: red, brown t Structures Foul Odor After Cleansing: No Slough/Fibrino Yes Wound Bed Granulation Amount: Small (1-33%) Exposed Structure Granulation Quality: Red, Pink Fascia Exposed: No Necrotic Amount: Medium (34-66%) Fat Layer (Subcutaneous Tissue) Exposed: Yes Necrotic Quality: Adherent Slough Tendon Exposed: No Muscle Exposed: No Joint Exposed: No Bone Exposed: No Treatment Notes Wound #1 (Lower Leg) Wound Laterality: Left, Medial Cleanser Soap and Water Discharge Instruction: Gently cleanse wound with antibacterial soap, rinse and pat dry prior to dressing wounds Vashe 5.8 (oz) Discharge Instruction: Use vashe 5.8 (oz) as directed Peri-Wound Care Topical Primary Dressing Prisma 4.34 (in) Discharge Instruction: Moisten w/normal saline or sterile water; Cover wound as directed. Do not remove from wound bed. Secondary Dressing ABD Pad 5x9 (in/in) Discharge Instruction: Cover with ABD pad Secured With Compression Wrap Urgo K2 Lite, two layer compression system, regular Compression Stockings Add-Ons Electronic Signature(s) Signed: 04/08/2023 1:24:46 PM By: Midge Aver MSN RN CNS WTA Entered By: Midge Aver on 04/08/2023 08:56:52 Ariana Guzman ([id number])  [id number] Page 11 of 12 -------------------------------------------------------------------------------- Wound Assessment Details Patient Name: Date of Service: Ariana CEAlphonse Guzman 04/08/2023 11:30 A M Medical Record Number: [id number] Patient Account Number: [id number] Date of Birth/Sex: Treating RN: Mar 02, 1941 (82 y.o. Ariana Guzman Primary Care Jeryl Wilbourn: Bari Edward Other Clinician: Referring Angeligue Bowne: Treating Taylor Levick/Extender: Silvestre Moment in Treatment: 0 Wound Status Wound Number: 2 Primary Etiology: Skin Tear Wound Location: Right, Posterior Forearm Wound Status: Open Wounding Event: Skin Tear/Laceration Comorbid History: Aspiration, Hypertension, Neuropathy Date Acquired: 03/31/2023 Weeks Of Treatment: 0 Clustered Wound: No Photos Wound Measurements Length: (cm) 5.5 Width: (cm) 0.2 Depth: (cm) 0.1 Area: (cm) 0.864 Volume: (cm) 0.086 % Reduction in Area: % Reduction in Volume: Epithelialization: Small (1-33%) Tunneling: No Undermining: No Wound Description Classification: Full Thickness Without Exposed Suppor Exudate Amount: Medium Exudate Type: Serosanguineous Exudate Color: red, brown t Structures Foul Odor After Cleansing: No Slough/Fibrino No Wound Bed Granulation Amount: Small (1-33%) Exposed Structure Granulation Quality: Red, Pink Fascia Exposed: No Necrotic Amount: None Present (0%) Fat Layer (Subcutaneous Tissue) Exposed: No Tendon Exposed: No Muscle Exposed: No Joint Exposed: No Bone Exposed: No Treatment Notes Wound #2 (Forearm) Wound Laterality: Right, Posterior Cleanser Soap and Water Discharge Instruction: Gently cleanse wound with antibacterial soap, rinse and pat dry prior to dressing wounds Peri-Wound Care Ariana Guzman, Ariana Guzman ([id number]) [id number] Page 12 of 12 Topical Primary Dressing Xeroform 4x4-HBD (in/in) Discharge Instruction: Apply Xeroform 4x4-HBD  (in/in) as directed Secondary Dressing Conforming Guaze Roll-Large Discharge Instruction: Apply Conforming Stretch Guaze Bandage as directed Secured With Stretch Net Dressing, Latex-free, Size 5, Small-Head / Shoulder / Thigh Compression Wrap Compression Stockings Add-Ons Electronic Signature(s) Signed: 04/08/2023 1:24:46 PM By: Midge Aver MSN RN CNS WTA Entered By: Midge Aver on 04/08/2023 08:59:25 -------------------------------------------------------------------------------- Vitals Details Patient Name: Date of Service: Ariana Guzman, Ariana Rathke Guzman. 04/08/2023 11:30 A M Medical Record Number: [id number] Patient Account Number: [id number] Date of Birth/Sex: Treating RN: 01/22/1941 (82 y.o. Ariana Guzman Primary Care Jernard Reiber: Bari Edward Other Clinician: Referring Skyllar Notarianni: Treating Vasilios Ottaway/Extender: Silvestre Moment in Treatment: 0 Vital Signs Time Taken: 11:30 Temperature (F): 97.7 Height (in): 66 Pulse (bpm): 62 Source: Stated Respiratory Rate (breaths/min): 18 Blood Pressure (mmHg): 137/69 Reference Range: 80 - 120 mg / dl Electronic Signature(s) Signed: 04/08/2023 1:24:46 PM By: Midge Aver  MSN RN CNS WTA Entered By: Midge Aver on 04/08/2023 08:32:28

## 2023-04-08 NOTE — Progress Notes (Signed)
NICOLINE, BODE ([id number]) [phone] [email or web address] Page 1 of 5 Visit Report for 04/08/2023 Abuse Risk Screen Details Patient Name: Date of Service: Ariana Guzman 04/08/2023 11:30 A M Medical Record Number: [id number] Patient Account Number: [id number] Date of Birth/Sex: Treating RN: October 26, 1940 (82 y.o. Female) Midge Aver Primary Care Creedence Kunesh: Bari Edward Other Clinician: Referring Gordon Vandunk: Treating Lanetta Figuero/Extender: Silvestre Moment in Treatment: 0 Abuse Risk Screen Items Answer Electronic Signature(s) Signed: 04/08/2023 1:24:46 PM By: Midge Aver MSN RN CNS WTA Entered By: Midge Aver on 04/08/2023 08:36:50 -------------------------------------------------------------------------------- Activities of Daily Living Details Patient Name: Date of Service: Ariana Guzman, Ariana Rathke Guzman. 04/08/2023 11:30 A M Medical Record Number: [id number] Patient Account Number: [id number] Date of Birth/Sex: Treating RN: 07-04-1941 (82 y.o. Female) Midge Aver Primary Care Kasha Howeth: Bari Edward Other Clinician: Referring Damisha Wolff: Treating Shylo Zamor/Extender: Silvestre Moment in Treatment: 0 Activities of Daily Living Items Answer Activities of Daily Living (Please select one for each item) Drive Automobile Not Able T Medications ake Need Assistance Use T elephone Completely Able Care for Appearance Completely Able Use T oilet Completely Able Bath / Shower Completely Able Dress Self Completely Able Feed Self Completely Able Walk Need Assistance Get In / Out Bed Completely Able Housework Completely Able Prepare Meals Completely Able Handle Money Need Assistance Shop for Self Need Assistance Ariana Guzman, Ariana Guzman ([id number]) [phone] [email or web address] Page 2 of 5 Electronic Signature(s) Signed: 04/08/2023 1:24:46 PM By: Midge Aver MSN RN CNS WTA Entered By: Midge Aver on 04/08/2023  08:37:27 -------------------------------------------------------------------------------- Education Screening Details Patient Name: Date of Service: Ariana Guzman, Ariana ZEL Guzman. 04/08/2023 11:30 A M Medical Record Number: [id number] Patient Account Number: [id number] Date of Birth/Sex: Treating RN: March 09, 1941 (82 y.o. Female) Midge Aver Primary Care Aubreyana Saltz: Bari Edward Other Clinician: Referring Gabrielle Wakeland: Treating Sebastain Fishbaugh/Extender: Silvestre Moment in Treatment: 0 Learning Preferences/Education Level/Primary Language Learning Preference: Explanation, Demonstration Preferred Language: English Cognitive Barrier Language Barrier: No Translator Needed: No Memory Deficit: No Emotional Barrier: No Cultural/Religious Beliefs Affecting Medical Care: No Physical Barrier Impaired Vision: No Impaired Hearing: No Decreased Hand dexterity: No Knowledge/Comprehension Knowledge Level: High Comprehension Level: High Ability to understand written instructions: High Ability to understand verbal instructions: High Motivation Anxiety Level: Calm Cooperation: Cooperative Education Importance: Acknowledges Need Interest in Health Problems: Asks Questions Perception: Coherent Willingness to Engage in Self-Management High Activities: Readiness to Engage in Self-Management High Activities: Electronic Signature(s) Signed: 04/08/2023 1:24:46 PM By: Midge Aver MSN RN CNS WTA Entered By: Midge Aver on 04/08/2023 08:37:53 Manfred Shirts ([id number]) [id number] Page 3 of 5 -------------------------------------------------------------------------------- Fall Risk Assessment Details Patient Name: Date of Service: Ariana Guzman 04/08/2023 11:30 A M Medical Record Number: [id number] Patient Account Number: [id number] Date of Birth/Sex: Treating RN: 1941/07/01 (82 y.o. Female) Midge Aver Primary Care Earvin Blazier: Bari Edward Other  Clinician: Referring Earleen Aoun: Treating Thor Nannini/Extender: Silvestre Moment in Treatment: 0 Fall Risk Assessment Items Have you had 2 or more falls in the last 12 monthso 0 No Have you had any fall that resulted in injury in the last 12 monthso 0 No FALLS RISK SCREEN History of falling - immediate or within 3 months 0 No Secondary diagnosis (Do you have 2 or more medical diagnoseso) 0 No Ambulatory aid None/bed rest/wheelchair/nurse 0 No Crutches/cane/walker 15 Yes Furniture 0 No Intravenous therapy Access/Saline/Heparin Lock 0 No Gait/Transferring Normal/ bed rest/ wheelchair 0 No Weak (short steps with or without shuffle, stooped but able to lift head while  walking, may seek 0 No support from furniture) Impaired (short steps with shuffle, may have difficulty arising from chair, head down, impaired 0 No balance) Mental Status Oriented to own ability 0 Yes Electronic Signature(s) Signed: 04/08/2023 1:24:46 PM By: Midge Aver MSN RN CNS WTA Entered By: Midge Aver on 04/08/2023 08:38:24 -------------------------------------------------------------------------------- Foot Assessment Details Patient Name: Date of Service: Ariana Guzman, Ariana Rathke Guzman. 04/08/2023 11:30 A M Medical Record Number: [id number] Patient Account Number: [id number] Date of Birth/Sex: Treating RN: Jan 31, 1941 (82 y.o. Female) Midge Aver Primary Care Darly Fails: Bari Edward Other Clinician: Referring Tazia Illescas: Treating Vanissa Strength/Extender: Silvestre Moment in Treatment: 0 Foot Assessment Items Site Locations Ariana Guzman, Ariana Guzman ([id number]) [id number] Page 4 of 5 + = Sensation present, - = Sensation absent, C = Callus, U = Ulcer R = Redness, W = Warmth, M = Maceration, PU = Pre-ulcerative lesion F = Fissure, S = Swelling, D = Dryness Assessment Right: Left: Other Deformity: No No Prior Foot Ulcer: No No Prior Amputation: No No Charcot Joint: No  No Ambulatory Status: Ambulatory With Help Assistance Device: Walker 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 04/08/2023 11:30 A M Medical Record Number: [id number] Patient Account Number: [id number] Date of Birth/Sex: Treating RN: 06/16/1941 (82 y.o. Female) Midge Aver Primary Care Ella Guillotte: Bari Edward Other Clinician: Referring Kutler Vanvranken: Treating Zaidyn Claire/Extender: Silvestre Moment in Treatment: 0 Height (in): 66 Weight (lbs): Body Mass Index (BMI): Nutrition Risk Screening Items Score Screening NUTRITION RISK SCREEN: I have an illness or condition that made me change the kind and/or amount of food I eat 0 No I eat fewer than two meals per day 0 No I eat few fruits and vegetables, or milk products 0 No I have three or more drinks of beer, liquor or wine almost every day 0 No I have tooth or mouth problems that make it hard for me to eat 0 No Ariana Guzman, Ariana Guzman ([id number]) [phone] [id number] Page 5 of 5 I don't always have enough money to buy the food I need 0 No I eat alone most of the time 0 No I take three or more different prescribed or over-the-counter drugs a day 1 Yes Without wanting to, I have lost or gained 10 pounds in the last six months 0 No I am not always physically able to shop, cook and/or feed myself 0 No Nutrition Protocols Good Risk Protocol 0 No interventions needed Moderate Risk Protocol High Risk Proctocol Risk Level: Good Risk Score: 1 Electronic Signature(s) Signed: 04/08/2023 1:24:46 PM By: Midge Aver MSN RN CNS WTA Entered By: Midge Aver on 04/08/2023 08:38:42

## 2023-04-08 NOTE — Progress Notes (Signed)
JENEVA, DRAGOVICH ([id number]) [id number] Page 1 of 9 Visit Report for 04/08/2023 Chief Complaint Document Details Patient Name: Date of Service: Ariana Guzman 04/08/2023 11:30 A M Medical Record Number: [id number] Patient Account Number: [id number] Date of Birth/Sex: Treating RN: 1941-03-28 (82 y.o. [name] Primary Care Provider: Bari Edward Other Clinician: Referring Provider: Treating Provider/Extender: Silvestre Moment in Treatment: 0 Information Obtained from: Patient Chief Complaint Left leg and right forearm ulcers Electronic Signature(s) Signed: 04/08/2023 12:23:44 PM By: Allen Derry PA-C Entered By: Allen Derry on 04/08/2023 09:23:44 -------------------------------------------------------------------------------- HPI Details Patient Name: Date of Service: Ariana Guzman, Ariana ZEL Guzman. 04/08/2023 11:30 A M Medical Record Number: [id number] Patient Account Number: [id number] Date of Birth/Sex: Treating RN: [birth year] (82 y.o. [name] Primary Care Provider: Bari Edward Other Clinician: Referring Provider: Treating Provider/Extender: Silvestre Moment in Treatment: 0 History of Present Illness Chronic/Inactive Conditions Condition 1: 04-08-2023 patient's ABI on screening in the office was 0.66 although she does have a history of having normal ABIs in 2019. I would like to send her back for formal arterial testing. HPI Description: 04-08-2023 patient presents for initial inspection here in clinic concerning 2 issues 1 is a skin tear on her right forearm and the other is a wound on her left lower extremity. Subsequently she tells me that the skin tear actually occurred around March 31, 2023 only about a week ago and seems to be doing pretty well. With regard to the leg she tells me that she actually dropped something on her leg which caused a skin tear and this just has not healed she is not exactly sure why. She  generally does wear compression socks although she has not been able to do it because of the fact that she has the wound. She has significant varicose veins which have been attempted to be treated previous but not completely resolved. With that being said all in all I think that she is doing well I believe it could be that the reason she is not healing with regard to her leg is simply that she has not been able to achieve good compression not being able to use a compression sock. With that being said her ABI was measuring at 0.66 which I think could also be an concern. I would like to have her formally tested as her last formal arterial study was actually in 2019. Patient does have a history of chronic venous insufficiency, peripheral vascular disease, hypertension, thrombophilia, atrial fibrillation, and long-term use of anticoagulants. Ariana Guzman, Ariana Guzman ([id number]) [id number] Page 2 of 9 Electronic Signature(s) Signed: 04/08/2023 1:22:12 PM By: Allen Derry PA-C Entered By: Allen Derry on 04/08/2023 10:22:12 -------------------------------------------------------------------------------- Physical Exam Details Patient Name: Date of Service: Ariana Guzman, Ariana ZEL Guzman. 04/08/2023 11:30 A M Medical Record Number: [id number] Patient Account Number: [id number] Date of Birth/Sex: Treating RN: March 14, 1941 (82 y.o. [name] Primary Care Provider: Bari Edward Other Clinician: Referring Provider: Treating Provider/Extender: Silvestre Moment in Treatment: 0 Constitutional sitting or standing blood pressure is within target range for patient.. pulse regular and within target range for patient.Marland Kitchen respirations regular, non-labored and within target range for patient.Marland Kitchen temperature within target range for patient.. Obese and well-hydrated in no acute distress. Eyes conjunctiva clear no eyelid edema noted. pupils equal round and reactive to light and  accommodation. Ears, Nose, Mouth, and Throat no gross abnormality of ear auricles or external auditory canals. normal hearing noted during conversation. mucus membranes  moist. Respiratory normal breathing without difficulty. Cardiovascular 1+ dorsalis pedis/posterior tibialis pulses. 2+ pitting edema of the bilateral lower extremities. Musculoskeletal normal gait and posture. no significant deformity or arthritic changes, no loss or range of motion, no clubbing. Psychiatric this patient is able to make decisions and demonstrates good insight into disease process. Alert and Oriented x 3. pleasant and cooperative. Notes Upon inspection patient's wound bed actually showed signs of good granulation epithelization at this point. Fortunately I do not see any signs of necrotic tissue significantly and I am very pleased in that regard with that being said the leg ulcer unfortunately is not healing quite as quickly as I would like to see this happen. I discussed that with the patient. For that reason we Argun initiate treatment with the compression wrapping but I am also going to recommend that she should likely go for formal arterial testing to ensure that she has good arterial flow to actually be able to heal her wounds. The patient is in agreement with that plan. Electronic Signature(s) Signed: 04/08/2023 1:29:25 PM By: Allen Derry PA-C Entered By: Allen Derry on 04/08/2023 10:29:25 Physician Orders Details -------------------------------------------------------------------------------- Ariana Guzman ([id number]) [id number] Page 3 of 9 Patient Name: Date of Service: Ariana Guzman, Ariana Guzman 04/08/2023 11:30 A M Medical Record Number: [id number] Patient Account Number: [id number] Date of Birth/Sex: Treating RN: 1940-10-14 (82 y.o. [name] Primary Care Provider: Bari Edward Other Clinician: Referring Provider: Treating Provider/Extender: Silvestre Moment in Treatment: 0 Verbal / Phone Orders: No Diagnosis Coding ICD-10 Coding Code Description [phone] Laceration without foreign body of right forearm, initial encounter I87.332 Chronic venous hypertension (idiopathic) with ulcer and inflammation of left lower extremity L97.822 Non-pressure chronic ulcer of other part of left lower leg with fat layer exposed I73.89 Other specified peripheral vascular diseases I10 Essential (primary) hypertension D68.69 Other thrombophilia I48.0 Paroxysmal atrial fibrillation Z79.01 Long term (current) use of anticoagulants Follow-up Appointments Return Appointment in 1 week. Bathing/ Shower/ Hygiene May shower with wound dressing protected with water repellent cover or cast protector. Anesthetic (Use 'Patient Medications' Section for Anesthetic Order Entry) Lidocaine applied to wound bed Edema Control - Lymphedema / Segmental Compressive Device / Other UrgoK2 LITE Wound Treatment Wound #1 - Lower Leg Wound Laterality: Left, Medial Cleanser: Soap and Water 1 x Per Week/30 Days Discharge Instructions: Gently cleanse wound with antibacterial soap, rinse and pat dry prior to dressing wounds Cleanser: Vashe 5.8 (oz) 1 x Per Week/30 Days Discharge Instructions: Use vashe 5.8 (oz) as directed Prim Dressing: Prisma 4.34 (in) 1 x Per Week/30 Days ary Discharge Instructions: Moisten w/normal saline or sterile water; Cover wound as directed. Do not remove from wound bed. Secondary Dressing: ABD Pad 5x9 (in/in) 1 x Per Week/30 Days Discharge Instructions: Cover with ABD pad Compression Wrap: Urgo K2 Lite, two layer compression system, regular 1 x Per Week/30 Days Wound #2 - Forearm Wound Laterality: Right, Posterior Cleanser: Soap and Water Every Other Day/30 Days Discharge Instructions: Gently cleanse wound with antibacterial soap, rinse and pat dry prior to dressing wounds Prim Dressing: Xeroform 4x4-HBD (in/in) Every Other Day/30  Days ary Discharge Instructions: Apply Xeroform 4x4-HBD (in/in) as directed Secondary Dressing: Conforming Guaze Roll-Large Every Other Day/30 Days Discharge Instructions: Apply Conforming Stretch Guaze Bandage as directed Secured With: Stretch Net Dressing, Latex-free, Size 5, Small-Head / Shoulder / Thigh Every Other Day/30 Days Services and Therapies Ankle Brachial Index (ABI) Electronic Signature(s) Signed: 04/08/2023 1:24:46 PM By: Midge Aver MSN RN  CNS WTA Signed: 04/08/2023 1:57:59 PM By: Allen Derry PA-C Entered By: Midge Aver on 04/08/2023 10:14:40 Ariana Guzman ([id number]) [id number] Page 4 of 9 -------------------------------------------------------------------------------- Problem List Details Patient Name: Date of Service: Ariana CEAlphonse Guzman 04/08/2023 11:30 A M Medical Record Number: [id number] Patient Account Number: [id number] Date of Birth/Sex: Treating RN: August 25, 1940 (82 y.o. [name] Primary Care Provider: Bari Edward Other Clinician: Referring Provider: Treating Provider/Extender: Silvestre Moment in Treatment: 0 Active Problems ICD-10 Encounter Code Description Active Date MDM Diagnosis [phone] Laceration without foreign body of right forearm, initial encounter 04/08/2023 No Yes I87.332 Chronic venous hypertension (idiopathic) with ulcer and inflammation of left 04/08/2023 No Yes lower extremity L97.822 Non-pressure chronic ulcer of other part of left lower leg with fat layer exposed8/23/2024 No Yes I73.89 Other specified peripheral vascular diseases 04/08/2023 No Yes I10 Essential (primary) hypertension 04/08/2023 No Yes D68.69 Other thrombophilia 04/08/2023 No Yes I48.0 Paroxysmal atrial fibrillation 04/08/2023 No Yes Z79.01 Long term (current) use of anticoagulants 04/08/2023 No Yes Inactive Problems Resolved Problems Electronic Signature(s) Signed: 04/08/2023 12:23:20 PM By: Allen Derry PA-C Entered  By: Allen Derry on 04/08/2023 09:23:20 Ariana Guzman ([id number]) [id number] Page 5 of 9 -------------------------------------------------------------------------------- Progress Note Details Patient Name: Date of Service: Ariana Guzman, Ariana Guzman 04/08/2023 11:30 A M Medical Record Number: [id number] Patient Account Number: [id number] Date of Birth/Sex: Treating RN: 15-Oct-1940 (82 y.o. [name] Primary Care Provider: Bari Edward Other Clinician: Referring Provider: Treating Provider/Extender: Silvestre Moment in Treatment: 0 Subjective Chief Complaint Information obtained from Patient Left leg and right forearm ulcers History of Present Illness (HPI) Chronic/Inactive Condition: 04-08-2023 patient's ABI on screening in the office was 0.66 although she does have a history of having normal ABIs in 2019. I would like to send her back for formal arterial testing. 04-08-2023 patient presents for initial inspection here in clinic concerning 2 issues 1 is a skin tear on her right forearm and the other is a wound on her left lower extremity. Subsequently she tells me that the skin tear actually occurred around March 31, 2023 only about a week ago and seems to be doing pretty well. With regard to the leg she tells me that she actually dropped something on her leg which caused a skin tear and this just has not healed she is not exactly sure why. She generally does wear compression socks although she has not been able to do it because of the fact that she has the wound. She has significant varicose veins which have been attempted to be treated previous but not completely resolved. With that being said all in all I think that she is doing well I believe it could be that the reason she is not healing with regard to her leg is simply that she has not been able to achieve good compression not being able to use a compression sock. With that being said her ABI  was measuring at 0.66 which I think could also be an concern. I would like to have her formally tested as her last formal arterial study was actually in 2019. Patient does have a history of chronic venous insufficiency, peripheral vascular disease, hypertension, thrombophilia, atrial fibrillation, and long-term use of anticoagulants. Patient History Information obtained from Patient. Allergies No Known Allergies Social History Never smoker, Marital Status - Widowed, Alcohol Use - Never, Drug Use - No History, Caffeine Use - Never. Medical History Respiratory Patient has history of Aspiration Cardiovascular Patient has  history of Hypertension Endocrine Denies history of Type I Diabetes, Type II Diabetes Integumentary (Skin) Denies history of History of Burn, History of pressure wounds Neurologic Patient has history of Neuropathy Medical A Surgical History Notes nd Cardiovascular Afib Review of Systems (ROS) Constitutional Symptoms (General Health) Denies complaints or symptoms of Fatigue, Fever, Chills, Marked Weight Change. Eyes Denies complaints or symptoms of Dry Eyes, Vision Changes, Glasses / Contacts. Ear/Nose/Mouth/Throat Denies complaints or symptoms of Difficult clearing ears, Sinusitis. Cardiovascular Complains or has symptoms of LE edema. Gastrointestinal Denies complaints or symptoms of Frequent diarrhea, Nausea, Vomiting. Endocrine Complains or has symptoms of Thyroid disease. Genitourinary Denies complaints or symptoms of Kidney failure/ Dialysis, Incontinence/dribbling. Ariana Guzman, Ariana Guzman ([id number]) [id number] Page 6 of 9 Integumentary (Skin) Denies complaints or symptoms of Wounds, Bleeding or bruising tendency, Breakdown, Swelling. Musculoskeletal Denies complaints or symptoms of Muscle Pain, Muscle Weakness. Psychiatric Denies complaints or symptoms of Anxiety, Claustrophobia. Objective Constitutional sitting or standing  blood pressure is within target range for patient.. pulse regular and within target range for patient.Marland Kitchen respirations regular, non-labored and within target range for patient.Marland Kitchen temperature within target range for patient.. Obese and well-hydrated in no acute distress. Vitals Time Taken: 11:30 AM, Height: 66 in, Source: Stated, Temperature: 97.7 F, Pulse: 62 bpm, Respiratory Rate: 18 breaths/min, Blood Pressure: 137/69 mmHg. Eyes conjunctiva clear no eyelid edema noted. pupils equal round and reactive to light and accommodation. Ears, Nose, Mouth, and Throat no gross abnormality of ear auricles or external auditory canals. normal hearing noted during conversation. mucus membranes moist. Respiratory normal breathing without difficulty. Cardiovascular 1+ dorsalis pedis/posterior tibialis pulses. 2+ pitting edema of the bilateral lower extremities. Musculoskeletal normal gait and posture. no significant deformity or arthritic changes, no loss or range of motion, no clubbing. Psychiatric this patient is able to make decisions and demonstrates good insight into disease process. Alert and Oriented x 3. pleasant and cooperative. General Notes: Upon inspection patient's wound bed actually showed signs of good granulation epithelization at this point. Fortunately I do not see any signs of necrotic tissue significantly and I am very pleased in that regard with that being said the leg ulcer unfortunately is not healing quite as quickly as I would like to see this happen. I discussed that with the patient. For that reason we Argun initiate treatment with the compression wrapping but I am also going to recommend that she should likely go for formal arterial testing to ensure that she has good arterial flow to actually be able to heal her wounds. The patient is in agreement with that plan. Integumentary (Hair, Skin) Wound #1 status is Open. Original cause of wound was Trauma. The date acquired was: 12/31/2022.  The wound is located on the Left,Medial Lower Leg. The wound measures 2.5cm length x 0.8cm width x 0.2cm depth; 1.571cm^2 area and 0.314cm^3 volume. There is Fat Layer (Subcutaneous Tissue) exposed. There is no tunneling or undermining noted. There is a medium amount of serosanguineous drainage noted. There is small (1-33%) red, pink granulation within the wound bed. There is a medium (34-66%) amount of necrotic tissue within the wound bed including Adherent Slough. Wound #2 status is Open. Original cause of wound was Skin Tear/Laceration. The date acquired was: 03/31/2023. The wound is located on the Right,Posterior Forearm. The wound measures 5.5cm length x 0.2cm width x 0.1cm depth; 0.864cm^2 area and 0.086cm^3 volume. There is no tunneling or undermining noted. There is a medium amount of serosanguineous drainage noted. There is small (1-33%) red, pink granulation  within the wound bed. There is no necrotic tissue within the wound bed. 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 There was a Three Layer Compression Therapy Procedure by Midge Aver, RN. Ariana Guzman, Ariana Guzman ([id number]) [id number] Page 7 of 9 Post procedure Diagnosis Wound #1: Same as Pre-Procedure Plan Follow-up Appointments: Return Appointment in 1 week. Bathing/ Shower/ Hygiene: May shower with wound dressing protected with water repellent cover or cast protector. Anesthetic (Use  'Patient Medications' Section for Anesthetic Order Entry): Lidocaine applied to wound bed Edema Control - Lymphedema / Segmental Compressive Device / Other: UrgoK2 LITE Services and Therapies ordered were: Ankle Brachial Index (ABI) WOUND #1: - Lower Leg Wound Laterality: Left, Medial Cleanser: Soap and Water 1 x Per Week/30 Days Discharge Instructions: Gently cleanse wound with antibacterial soap, rinse and pat dry prior to dressing wounds Cleanser: Vashe 5.8 (oz) 1 x Per Week/30 Days Discharge Instructions: Use vashe 5.8 (oz) as directed Prim Dressing: Prisma 4.34 (in) 1 x Per Week/30 Days ary Discharge Instructions: Moisten w/normal saline or sterile water; Cover wound as directed. Do not remove from wound bed. Secondary Dressing: ABD Pad 5x9 (in/in) 1 x Per Week/30 Days Discharge Instructions: Cover with ABD pad Com pression Wrap: Urgo K2 Lite, two layer compression system, regular 1 x Per Week/30 Days WOUND #2: - Forearm Wound Laterality: Right, Posterior Cleanser: Soap and Water Every Other Day/30 Days Discharge Instructions: Gently cleanse wound with antibacterial soap, rinse and pat dry prior to dressing wounds Prim Dressing: Xeroform 4x4-HBD (in/in) Every Other Day/30 Days ary Discharge Instructions: Apply Xeroform 4x4-HBD (in/in) as directed Secondary Dressing: Conforming Guaze Roll-Large Every Other Day/30 Days Discharge Instructions: Apply Conforming Stretch Guaze Bandage as directed Secured With: Stretch Net Dressing, Latex-free, Size 5, Small-Head / Shoulder / Thigh Every Other Day/30 Days 1. I am going to recommend that we initiate treatment with collagen to the leg followed by the Urgo K2 lite compression wrap. 2. I am going to recommend the patient should continue with the Xeroform gauze dressing to the arm which I think is doing well. 3. We are not going to to send her for formal arterial testing in order to make sure that she still has good arterial flow to heal  her wounds. We will see patient back for reevaluation in 1 week here in the clinic. If anything worsens or changes patient will contact our office for additional recommendations. Electronic Signature(s) Signed: 04/08/2023 1:29:55 PM By: Allen Derry PA-C Entered By: Allen Derry on 04/08/2023 10:29:55 -------------------------------------------------------------------------------- ROS/PFSH Details Patient Name: Date of Service: Ariana Guzman, Ariana ZEL Guzman. 04/08/2023 11:30 A M Medical Record Number: [id number] Patient Account Number: [id number] Date of Birth/Sex: Treating RN: [birth year] (82 y.o. [name] Primary Care Provider: Bari Edward Other Clinician: Referring Provider: Treating Provider/Extender: Silvestre Moment in Treatment: 0 Information Obtained From Patient Constitutional Symptoms (General Health) Complaints and Symptoms: Negative for: Fatigue; Fever; Chills; Marked Weight Change Ariana Guzman, Ariana Guzman ([id number]) [id number] Page 8 of 9 Eyes Complaints and Symptoms: Negative for: Dry Eyes; Vision Changes; Glasses / Contacts Ear/Nose/Mouth/Throat Complaints and Symptoms: Negative  for: Difficult clearing ears; Sinusitis Cardiovascular Complaints and Symptoms: Positive for: LE edema Medical History: Positive for: Hypertension Past Medical History Notes: Afib Gastrointestinal Complaints and Symptoms: Negative for: Frequent diarrhea; Nausea; Vomiting Endocrine Complaints and Symptoms: Positive for: Thyroid disease Medical History: Negative for: Type I Diabetes; Type II Diabetes Genitourinary Complaints and Symptoms: Negative for: Kidney failure/ Dialysis; Incontinence/dribbling Integumentary (Skin) Complaints and Symptoms: Negative for: Wounds; Bleeding or bruising tendency; Breakdown; Swelling Medical History: Negative for: History of Burn; History of pressure wounds Musculoskeletal Complaints and Symptoms: Negative for:  Muscle Pain; Muscle Weakness Psychiatric Complaints and Symptoms: Negative for: Anxiety; Claustrophobia Respiratory Medical History: Positive for: Aspiration Neurologic Medical History: Positive for: Neuropathy Oncologic Immunizations Pneumococcal Vaccine: Received Pneumococcal Vaccination: Yes Received Pneumococcal Vaccination On or After 60th Birthday: Yes Implantable Devices None Ariana Guzman, Ariana Guzman ([id number]) [id number] Page 9 of 58 Family and Social History Never smoker; Marital Status - Widowed; Alcohol Use: Never; Drug Use: No History; Caffeine Use: Never Electronic Signature(s) Signed: 04/08/2023 1:24:46 PM By: Midge Aver MSN RN CNS WTA Signed: 04/08/2023 1:57:59 PM By: Allen Derry PA-C Entered By: Midge Aver on 04/08/2023 08:36:46 -------------------------------------------------------------------------------- SuperBill Details Patient Name: Date of Service: Ariana Guzman, Ariana Guzman. 04/08/2023 Medical Record Number: [id number] Patient Account Number: [id number] Date of Birth/Sex: Treating RN: 03-27-1941 (81 y.o. [name] Primary Care Provider: Bari Edward Other Clinician: Referring Provider: Treating Provider/Extender: Silvestre Moment in Treatment: 0 Diagnosis Coding ICD-10 Codes Code Description [phone] Laceration without foreign body of right forearm, initial encounter I87.332 Chronic venous hypertension (idiopathic) with ulcer and inflammation of left lower extremity L97.822 Non-pressure chronic ulcer of other part of left lower leg with fat layer exposed I73.89 Other specified peripheral vascular diseases I10 Essential (primary) hypertension D68.69 Other thrombophilia I48.0 Paroxysmal atrial fibrillation Z79.01 Long term (current) use of anticoagulants Facility Procedures : CPT4 Code: [id number] Description: [phone] - WOUND CARE VISIT-LEV 5 EST PT Modifier: Quantity: 1 : CPT4 Code: [id number] Description:  (Facility Use Only) [id number] - APPLY MULTLAY COMPRS LWR LT LEG Modifier: Quantity: 1 Physician Procedures : CPT4 Code Description Modifier [id number] WC PHYS LEVEL 3 NEW PT ICD-10 Diagnosis Description S51.811A Laceration without foreign body of right forearm, initial encounter I87.332 Chronic venous hypertension (idiopathic) with ulcer and inflammation of left  lower extremity L97.822 Non-pressure chronic ulcer of other part of left lower leg with fat layer exposed I73.89 Other specified peripheral vascular diseases Quantity: 1 Electronic Signature(s) Signed: 04/08/2023 1:34:01 PM By: Allen Derry PA-C Previous Signature: 04/08/2023 1:24:46 PM Version By: Midge Aver MSN RN CNS WTA Entered By: Allen Derry on 04/08/2023 10:34:00

## 2023-04-10 ENCOUNTER — Other Ambulatory Visit: Payer: Self-pay | Admitting: Internal Medicine

## 2023-04-10 DIAGNOSIS — F5101 Primary insomnia: Secondary | ICD-10-CM

## 2023-04-15 ENCOUNTER — Encounter: Payer: Medicare Other | Admitting: Internal Medicine

## 2023-04-15 ENCOUNTER — Ambulatory Visit: Payer: Medicare Other | Admitting: Internal Medicine

## 2023-04-15 DIAGNOSIS — I739 Peripheral vascular disease, unspecified: Secondary | ICD-10-CM | POA: Diagnosis not present

## 2023-04-15 DIAGNOSIS — I480 Paroxysmal atrial fibrillation: Secondary | ICD-10-CM | POA: Diagnosis not present

## 2023-04-15 DIAGNOSIS — G629 Polyneuropathy, unspecified: Secondary | ICD-10-CM | POA: Diagnosis not present

## 2023-04-15 DIAGNOSIS — S81802A Unspecified open wound, left lower leg, initial encounter: Secondary | ICD-10-CM | POA: Diagnosis not present

## 2023-04-15 DIAGNOSIS — Z7901 Long term (current) use of anticoagulants: Secondary | ICD-10-CM | POA: Diagnosis not present

## 2023-04-15 DIAGNOSIS — I87332 Chronic venous hypertension (idiopathic) with ulcer and inflammation of left lower extremity: Secondary | ICD-10-CM | POA: Diagnosis not present

## 2023-04-15 DIAGNOSIS — D6869 Other thrombophilia: Secondary | ICD-10-CM | POA: Diagnosis not present

## 2023-04-15 DIAGNOSIS — S51811A Laceration without foreign body of right forearm, initial encounter: Secondary | ICD-10-CM | POA: Diagnosis not present

## 2023-04-15 DIAGNOSIS — I10 Essential (primary) hypertension: Secondary | ICD-10-CM | POA: Diagnosis not present

## 2023-04-15 DIAGNOSIS — I890 Lymphedema, not elsewhere classified: Secondary | ICD-10-CM | POA: Diagnosis not present

## 2023-04-15 DIAGNOSIS — L97822 Non-pressure chronic ulcer of other part of left lower leg with fat layer exposed: Secondary | ICD-10-CM | POA: Diagnosis not present

## 2023-04-15 DIAGNOSIS — I872 Venous insufficiency (chronic) (peripheral): Secondary | ICD-10-CM | POA: Diagnosis not present

## 2023-04-15 NOTE — Progress Notes (Signed)
Ariana, Guzman ([id number]) [id number] Page 1 of 11 Visit Report for 04/15/2023 Arrival Information Details Patient Name: Date of Service: Ariana Guzman 04/15/2023 12:30 PM Medical Record Number: [id number] Patient Account Number: [id number] Date of Birth/Sex: Treating RN: May 05, 1941 (82 y.o. Ariana Guzman Primary Care Lun Muro: Bari Edward Other Clinician: Referring Lino Wickliff: Treating Lylianna Fraiser/Extender: Chauncey Mann, MICHA EL Audrie Lia in Treatment: 1 Visit Information History Since Last Visit Added or deleted any medications: No Patient Arrived: Dan Humphreys Any new allergies or adverse reactions: No Arrival Time: 12:02 Had a fall or experienced change in No Accompanied By: son activities of daily living that may affect Transfer Assistance: None risk of falls: Patient Identification Verified: Yes Signs or symptoms of abuse/neglect since last visito No Secondary Verification Process Completed: Yes Hospitalized since last visit: No Patient Requires Transmission-Based Precautions: No Implantable device outside of the clinic excluding No Patient Has Alerts: Yes cellular tissue based products placed in the center Patient Alerts: Patient on Blood Thinner since last visit: Xarelto Has Dressing in Place as Prescribed: Yes Has Compression in Place as Prescribed: Yes Pain Present Now: No Electronic Signature(s) Signed: 04/15/2023 12:48:59 PM By: Yevonne Pax RN Entered By: Yevonne Pax on 04/15/2023 09:04:30 -------------------------------------------------------------------------------- Clinic Level of Care Assessment Details Patient Name: Date of Service: GRO Ariana Guzman 04/15/2023 12:30 PM Medical Record Number: [id number] Patient Account Number: [id number] Date of Birth/Sex: Treating RN: 1941-06-15 (82 y.o. Ariana Guzman Primary Care Carnisha Feltz: Bari Edward Other Clinician: Referring Jynesis Nakamura: Treating Seferina Brokaw/Extender: RO BSO Dorris Carnes,  MICHA EL Audrie Lia in Treatment: 1 Clinic Level of Care Assessment Items TOOL 1 Quantity Score []  - 0 Use when EandM and Procedure is performed on INITIAL visit ASSESSMENTS - Nursing Assessment / Reassessment []  - 0 General Physical Exam (combine w/ comprehensive assessment (listed just below) when performed on new pt. evals) []  - 0 Comprehensive Assessment (HX, ROS, Risk Assessments, Wounds Hx, etc.) NAYA, STRANO B ([id number]) [id number] Page 2 of 11 ASSESSMENTS - Wound and Skin Assessment / Reassessment []  - 0 Dermatologic / Skin Assessment (not related to wound area) ASSESSMENTS - Ostomy and/or Continence Assessment and Care []  - 0 Incontinence Assessment and Management []  - 0 Ostomy Care Assessment and Management (repouching, etc.) PROCESS - Coordination of Care []  - 0 Simple Patient / Family Education for ongoing care []  - 0 Complex (extensive) Patient / Family Education for ongoing care []  - 0 Staff obtains Chiropractor, Records, T Results / Process Orders est []  - 0 Staff telephones HHA, Nursing Homes / Clarify orders / etc []  - 0 Routine Transfer to another Facility (non-emergent condition) []  - 0 Routine Hospital Admission (non-emergent condition) []  - 0 New Admissions / Manufacturing engineer / Ordering NPWT Apligraf, etc. , []  - 0 Emergency Hospital Admission (emergent condition) PROCESS - Special Needs []  - 0 Pediatric / Minor Patient Management []  - 0 Isolation Patient Management []  - 0 Hearing / Language / Visual special needs []  - 0 Assessment of Community assistance (transportation, D/C planning, etc.) []  - 0 Additional assistance / Altered mentation []  - 0 Support Surface(s) Assessment (bed, cushion, seat, etc.) INTERVENTIONS - Miscellaneous []  - 0 External ear exam []  - 0 Patient Transfer (multiple staff / Nurse, adult / Similar devices) []  - 0 Simple Staple / Suture removal (25 or less) []  - 0 Complex  Staple / Suture removal (26 or more) []  - 0 Hypo/Hyperglycemic Management (do not check if billed separately) []  - 0  Ankle / Brachial Index (ABI) - do not check if billed separately Has the patient been seen at the hospital within the last three years: Yes Total Score: 0 Level Of Care: ____ Electronic Signature(s) Signed: 04/15/2023 12:48:59 PM By: Yevonne Pax RN Entered By: Yevonne Pax on 04/15/2023 09:33:39 -------------------------------------------------------------------------------- Compression Therapy Details Patient Name: Date of Service: Ariana August B. 04/15/2023 12:30 PM Medical Record Number: [id number] Patient Account Number: [id number] Date of Birth/Sex: Treating RN: March 10, 1941 (82 y.o. Ariana Guzman Primary Care Cinsere Mizrahi: Bari Edward Other Clinician: Referring Leela Vanbrocklin: Treating Svara Twyman/Extender: [address] MICHA EL Kadija, Urciuoli, Stamps B ([id number]) [id number] Page 3 of 11 Weeks in Treatment: 1 Compression Therapy Performed for Wound Assessment: Wound #1 Left,Medial Lower Leg Performed By: Clinician Yevonne Pax, RN Compression Type: Double Layer Post Procedure Diagnosis Same as Pre-procedure Electronic Signature(s) Signed: 04/15/2023 12:46:04 PM By: Yevonne Pax RN Entered By: Yevonne Pax on 04/15/2023 09:46:04 -------------------------------------------------------------------------------- Encounter Discharge Information Details Patient Name: Date of Service: [name], Ariana Guzman B. 04/15/2023 12:30 PM Medical Record Number: [id number] Patient Account Number: [id number] Date of Birth/Sex: Treating RN: August 01, 1941 (82 y.o. Ariana Guzman Primary Care Jancy Sprankle: Bari Edward Other Clinician: Referring Marilyne Haseley: Treating Novi Calia/Extender: RO BSO Dorris Carnes, MICHA EL Audrie Lia in Treatment: 1 Encounter Discharge Information Items Discharge Condition: Stable Ambulatory Status: Walker Discharge Destination:  Home Transportation: Private Auto Accompanied By: son Schedule Follow-up Appointment: Yes Clinical Summary of Care: Electronic Signature(s) Signed: 04/15/2023 12:48:59 PM By: Yevonne Pax RN Entered By: Yevonne Pax on 04/15/2023 09:34:35 -------------------------------------------------------------------------------- Lower Extremity Assessment Details Patient Name: Date of Service: GRO CEAlphonse Guild 04/15/2023 12:30 PM Medical Record Number: [id number] Patient Account Number: [id number] Date of Birth/Sex: Treating RN: 12-02-1940 (82 y.o. Ariana Guzman Primary Care Adriane Gabbert: Bari Edward Other Clinician: Referring Diogo Anne: Treating Jolanda Mccann/Extender: RO BSO N, MICHA EL Audrie Lia in Treatment: 1 Edema Assessment G[Left: KENNETRA, ANAGNOSTOPOULOS B ([id number] Franne Forts: [id number] Page 4 of 11] Assessed: [Left: No] [Right: No] Edema: [Left: Ye] [Right: s] Calf Left: Right: Point of Measurement: 36 cm From Medial Instep 45 cm Ankle Left: Right: Point of Measurement: 13 cm From Medial Instep 22 cm Knee To Floor Left: Right: From Medial Instep 39 cm Vascular Assessment Pulses: Dorsalis Pedis Palpable: [Left:No] Extremity colors, hair growth, and conditions: Extremity Color: [Left:Hyperpigmented] Hair Growth on Extremity: [Left:No] Temperature of Extremity: [Left:Warm] Capillary Refill: [Left:< 3 seconds] Dependent Rubor: [Left:No] Blanched when Elevated: [Left:No No] Toe Nail Assessment Left: Right: Thick: Yes Discolored: Yes Deformed: Yes Improper Length and Hygiene: No Electronic Signature(s) Signed: 04/15/2023 12:48:59 PM By: Yevonne Pax RN Entered By: Yevonne Pax on 04/15/2023 09:12:01 -------------------------------------------------------------------------------- Multi Wound Chart Details Patient Name: Date of Service: [name], Ariana Guzman B. 04/15/2023 12:30 PM Medical Record Number: [id number] Patient Account Number: [id number] Date  of Birth/Sex: Treating RN: 1941/01/03 (82 y.o. Ariana Guzman Primary Care Lynsee Wands: Bari Edward Other Clinician: Referring Shamond Skelton: Treating Jakaden Ouzts/Extender: RO BSO N, MICHA EL Audrie Lia in Treatment: 1 Vital Signs Height(in): 66 Pulse(bpm): 76 Weight(lbs): Blood Pressure(mmHg): 180/81 Body Mass Index(BMI): Temperature(F): 97.5 Respiratory Rate(breaths/min): 18 [1:Photos:] Skousen, Tomika B ([id number]) [1:Photos:] [[id number] Page 5 of 11 N/A] Left, Medial Lower Leg Right, Posterior Forearm N/A Wound Location: Trauma Skin T ear/Laceration N/A Wounding Event: Trauma, Other Skin T ear N/A Primary Etiology: Aspiration, Hypertension, Neuropathy Aspiration, Hypertension, Neuropathy N/A Comorbid History: 12/31/2022 03/31/2023 N/A Date Acquired: 1 1 N/A Weeks of Treatment: Open Open N/A Wound Status: No No  N/A Wound Recurrence: 2x0.5x0.1 0x0x0 N/A Measurements L x W x D (cm) 0.785 0 N/A A (cm) : rea 0.079 0 N/A Volume (cm) : 50.00% 100.00% N/A % Reduction in A rea: 74.80% 100.00% N/A % Reduction in Volume: Full Thickness Without Exposed Full Thickness Without Exposed N/A Classification: Support Structures Support Structures Medium None Present N/A Exudate Amount: Serosanguineous N/A N/A Exudate Type: red, brown N/A N/A Exudate Color: Small (1-33%) None Present (0%) N/A Granulation Amount: Red, Pink N/A N/A Granulation Quality: Medium (34-66%) None Present (0%) N/A Necrotic Amount: Fat Layer (Subcutaneous Tissue): Yes Fascia: No N/A Exposed Structures: Fascia: No Fat Layer (Subcutaneous Tissue): No Tendon: No Tendon: No Muscle: No Muscle: No Joint: No Joint: No Bone: No Bone: No N/A Small (1-33%) N/A Epithelialization: Treatment Notes Electronic Signature(s) Signed: 04/15/2023 12:48:59 PM By: Yevonne Pax RN Entered By: Yevonne Pax on 04/15/2023  09:12:05 -------------------------------------------------------------------------------- Multi-Disciplinary Care Plan Details Patient Name: Date of Service: [name], Ariana Guzman B. 04/15/2023 12:30 PM Medical Record Number: [id number] Patient Account Number: [id number] Date of Birth/Sex: Treating RN: 12/25/1940 (82 y.o. Ariana Guzman Primary Care Manon Banbury: Bari Edward Other Clinician: Referring Jenkins Risdon: Treating Dequann Vandervelden/Extender: RO BSO N, MICHA EL Audrie Lia in Treatment: 1 Active Inactive Necrotic Tissue Nursing Diagnoses: Impaired tissue integrity related to necrotic/devitalized tissue Knowledge deficit related to management of necrotic/devitalized tissue Goals: LAESHA, MUMPOWER ([id number]) [id number] Page 6 of 11 Necrotic/devitalized tissue will be minimized in the wound bed Date Initiated: 04/08/2023 Target Resolution Date: 05/09/2023 Goal Status: Active Patient/caregiver will verbalize understanding of reason and process for debridement of necrotic tissue Date Initiated: 04/08/2023 Target Resolution Date: 04/15/2023 Goal Status: Active Interventions: Assess patient pain level pre-, during and post procedure and prior to discharge Provide education on necrotic tissue and debridement process Treatment Activities: Apply topical anesthetic as ordered : 04/08/2023 Excisional debridement : 04/08/2023 Notes: Orientation to the Wound Care Program Nursing Diagnoses: Knowledge deficit related to the wound healing center program Goals: Patient/caregiver will verbalize understanding of the Wound Healing Center Program Date Initiated: 04/08/2023 Target Resolution Date: 04/15/2023 Goal Status: Active Interventions: Provide education on orientation to the wound center Notes: Venous Leg Ulcer Nursing Diagnoses: Knowledge deficit related to disease process and management Potential for venous Insuffiency (use before diagnosis  confirmed) Goals: Patient will maintain optimal edema control Date Initiated: 04/08/2023 Target Resolution Date: 05/09/2023 Goal Status: Active Patient/caregiver will verbalize understanding of disease process and disease management Date Initiated: 04/08/2023 Target Resolution Date: 05/09/2023 Goal Status: Active Verify adequate tissue perfusion prior to therapeutic compression application Date Initiated: 04/08/2023 Target Resolution Date: 04/22/2023 Goal Status: Active Interventions: Assess peripheral edema status every visit. Compression as ordered Provide education on venous insufficiency Treatment Activities: Non-invasive vascular studies : 04/08/2023 T ordered outside of clinic : 04/08/2023 est Therapeutic compression applied : 04/08/2023 Notes: Wound/Skin Impairment Nursing Diagnoses: Impaired tissue integrity Knowledge deficit related to ulceration/compromised skin integrity Goals: Patient/caregiver will verbalize understanding of skin care regimen Date Initiated: 04/08/2023 Target Resolution Date: 05/09/2023 Goal Status: Active Ulcer/skin breakdown will have a volume reduction of 30% by week 4 Date Initiated: 04/08/2023 Target Resolution Date: 05/09/2023 Goal Status: Active Ulcer/skin breakdown will have a volume reduction of 50% by week 8 Date Initiated: 04/08/2023 Target Resolution Date: 06/08/2023 Goal Status: Active ARLETA, APOLLO ([id number]) [phone].pdf Page 7 of 11 Ulcer/skin breakdown will have a volume reduction of 80% by week 12 Date Initiated: 04/08/2023 Target Resolution Date: 07/09/2023 Goal Status: Active Ulcer/skin breakdown will heal within 14 weeks Date Initiated:  04/08/2023 Target Resolution Date: 07/23/2023 Goal Status: Active Interventions: Assess patient/caregiver ability to obtain necessary supplies Assess patient/caregiver ability to perform ulcer/skin care regimen upon admission and as needed Assess ulceration(s) every  visit Provide education on ulcer and skin care Treatment Activities: Skin care regimen initiated : 04/08/2023 Notes: Electronic Signature(s) Signed: 04/15/2023 12:48:59 PM By: Yevonne Pax RN Entered By: Yevonne Pax on 04/15/2023 09:12:23 -------------------------------------------------------------------------------- Pain Assessment Details Patient Name: Date of Service: [name], Ariana Guzman B. 04/15/2023 12:30 PM Medical Record Number: [id number] Patient Account Number: [id number] Date of Birth/Sex: Treating RN: [birth year] (82 y.o. Ariana Guzman Primary Care Kishia Shackett: Bari Edward Other Clinician: Referring Doss Cybulski: Treating Reinhold Rickey/Extender: RO BSO Dorris Carnes, MICHA EL Audrie Lia in Treatment: 1 Active Problems Location of Pain Severity and Description of Pain Patient Has Paino No Site Locations Pain Management and Medication Current Pain Management: Electronic Signature(s) Signed: 04/15/2023 12:48:59 PM By: Yevonne Pax RN [place], [place] Silos ([id number]) [id number] Page 8 of 11 Entered By: Yevonne Pax on 04/15/2023 09:05:05 -------------------------------------------------------------------------------- Patient/Caregiver Education Details Patient Name: Date of Service: GRO CEAlphonse Guild 8/30/2024andnbsp12:30 PM Medical Record Number: [id number] Patient Account Number: [id number] Date of Birth/Gender: Treating RN: October 17, 1940 (82 y.o. Ariana Guzman Primary Care Physician: Bari Edward Other Clinician: Referring Physician: Treating Physician/Extender: RO BSO Dorris Carnes, MICHA EL Audrie Lia in Treatment: 1 Education Assessment Education Provided To: Patient Education Topics Provided Wound/Skin Impairment: Handouts: Caring for Your Ulcer Methods: Explain/Verbal Responses: State content correctly Electronic Signature(s) Signed: 04/15/2023 12:48:59 PM By: Yevonne Pax RN Entered By: Yevonne Pax on 04/15/2023  09:12:39 -------------------------------------------------------------------------------- Wound Assessment Details Patient Name: Date of Service: GRO CEAlphonse Guild 04/15/2023 12:30 PM Medical Record Number: [id number] Patient Account Number: [id number] Date of Birth/Sex: Treating RN: July 23, 1941 (82 y.o. Ariana Guzman Primary Care Claudell Wohler: Bari Edward Other Clinician: Referring Chistine Dematteo: Treating Landon Truax/Extender: RO BSO N, MICHA EL Audrie Lia in Treatment: 1 Wound Status Wound Number: 1 Primary Etiology: Trauma, Other Wound Location: Left, Medial Lower Leg Wound Status: Open Wounding Event: Trauma Comorbid History: Aspiration, Hypertension, Neuropathy Date Acquired: 12/31/2022 Weeks Of Treatment: 1 Clustered Wound: No Photos KEEVA, SCHNAIDT B ([id number]) [id number] Page 9 of 11 Wound Measurements Length: (cm) 2 Width: (cm) 0.5 Depth: (cm) 0.1 Area: (cm) 0.785 Volume: (cm) 0.079 % Reduction in Area: 50% % Reduction in Volume: 74.8% Tunneling: No Undermining: No Wound Description Classification: Full Thickness Without Exposed Suppor Exudate Amount: Medium Exudate Type: Serosanguineous Exudate Color: red, brown t Structures Foul Odor After Cleansing: No Slough/Fibrino Yes Wound Bed Granulation Amount: Small (1-33%) Exposed Structure Granulation Quality: Red, Pink Fascia Exposed: No Necrotic Amount: Medium (34-66%) Fat Layer (Subcutaneous Tissue) Exposed: Yes Necrotic Quality: Adherent Slough Tendon Exposed: No Muscle Exposed: No Joint Exposed: No Bone Exposed: No Treatment Notes Wound #1 (Lower Leg) Wound Laterality: Left, Medial Cleanser Soap and Water Discharge Instruction: Gently cleanse wound with antibacterial soap, rinse and pat dry prior to dressing wounds Vashe 5.8 (oz) Discharge Instruction: Use vashe 5.8 (oz) as directed Peri-Wound Care Topical Primary Dressing Prisma 4.34 (in) Discharge Instruction:  Moisten w/normal saline or sterile water; Cover wound as directed. Do not remove from wound bed. Secondary Dressing ABD Pad 5x9 (in/in) Discharge Instruction: Cover with ABD pad Secured With Compression Wrap Urgo K2 Lite, two layer compression system, regular Compression Stockings Add-Ons Electronic Signature(s) Signed: 04/15/2023 12:48:59 PM By: Yevonne Pax RN Entered By: Yevonne Pax on 04/15/2023 09:09:59 Manfred Shirts ([id number]) [id number] Page 10 of 11 -------------------------------------------------------------------------------- Wound  Assessment Details Patient Name: Date of Service: Ariana Guzman 04/15/2023 12:30 PM Medical Record Number: [id number] Patient Account Number: [id number] Date of Birth/Sex: Treating RN: [birth year] (82 y.o. Ariana Guzman Primary Care Atiyana Welte: Bari Edward Other Clinician: Referring Dynasia Kercheval: Treating Ayman Brull/Extender: RO BSO N, MICHA EL Audrie Lia in Treatment: 1 Wound Status Wound Number: 2 Primary Etiology: Skin Tear Wound Location: Right, Posterior Forearm Wound Status: Open Wounding Event: Skin Tear/Laceration Comorbid History: Aspiration, Hypertension, Neuropathy Date Acquired: 03/31/2023 Weeks Of Treatment: 1 Clustered Wound: No Photos Wound Measurements Length: (cm) Width: (cm) Depth: (cm) Area: (cm) Volume: (cm) 0 % Reduction in Area: 100% 0 % Reduction in Volume: 100% 0 Epithelialization: Small (1-33%) 0 Tunneling: No 0 Undermining: No Wound Description Classification: Full Thickness Without Exposed Support Exudate Amount: None Present Structures Foul Odor After Cleansing: No Slough/Fibrino No Wound Bed Granulation Amount: None Present (0%) Exposed Structure Necrotic Amount: None Present (0%) Fascia Exposed: No Fat Layer (Subcutaneous Tissue) Exposed: No Tendon Exposed: No Muscle Exposed: No Joint Exposed: No Bone Exposed: No Electronic Signature(s) Signed:  04/15/2023 12:48:59 PM By: Yevonne Pax RN Entered By: Yevonne Pax on 04/15/2023 09:10:42 Manfred Shirts ([id number]) [id number] Page 11 of 11 -------------------------------------------------------------------------------- Vitals Details Patient Name: Date of Service: GRO Ariana Guzman 04/15/2023 12:30 PM Medical Record Number: [id number] Patient Account Number: [id number] Date of Birth/Sex: Treating RN: [birth year] (82 y.o. Ariana Guzman Primary Care Dennis Killilea: Bari Edward Other Clinician: Referring Francia Verry: Treating Shirline Kendle/Extender: RO BSO N, MICHA EL Audrie Lia in Treatment: 1 Vital Signs Time Taken: 12:04 Temperature (F): 97.5 Height (in): 66 Pulse (bpm): 76 Respiratory Rate (breaths/min): 18 Blood Pressure (mmHg): 180/81 Reference Range: 80 - 120 mg / dl Electronic Signature(s) Signed: 04/15/2023 12:48:59 PM By: Yevonne Pax RN Entered By: Yevonne Pax on 04/15/2023 09:04:58

## 2023-04-15 NOTE — Progress Notes (Signed)
Ariana Guzman, Ariana Guzman ([id number]) [id number] Page 1 of 6 Visit Report for 04/15/2023 HPI Details Patient Name: Date of Service: Ariana Guzman 04/15/2023 12:30 PM Medical Record Number: [id number] Patient Account Number: [id number] Date of Birth/Sex: Treating RN: January 15, 1941 (82 y.o. Freddy Finner Primary Care Provider: Bari Edward Other Clinician: Referring Provider: Treating Provider/Extender: RO BSO N, MICHA EL Audrie Lia in Treatment: 1 History of Present Illness Chronic/Inactive Conditions Condition 1: 04-08-2023 patient's ABI on screening in the office was 0.66 although she does have a history of having normal ABIs in 2019. I would like to send her back for formal arterial testing. HPI Description: 04-08-2023 patient presents for initial inspection here in clinic concerning 2 issues 1 is a skin tear on her right forearm and the other is a wound on her left lower extremity. Subsequently she tells me that the skin tear actually occurred around March 31, 2023 only about a week ago and seems to be doing pretty well. With regard to the leg she tells me that she actually dropped something on her leg which caused a skin tear and this just has not healed she is not exactly sure why. She generally does wear compression socks although she has not been able to do it because of the fact that she has the wound. She has significant varicose veins which have been attempted to be treated previous but not completely resolved. With that being said all in all I think that she is doing well I believe it could be that the reason she is not healing with regard to her leg is simply that she has not been able to achieve good compression not being able to use a compression sock. With that being said her ABI was measuring at 0.66 which I think could also be an concern. I would like to have her formally tested as her last formal arterial study was actually in 2019. Patient  does have a history of chronic venous insufficiency, peripheral vascular disease, hypertension, thrombophilia, atrial fibrillation, and long-term use of anticoagulants 8/30; this is a patient with a wound on her left medial lower leg. We have been using Prisma under Urgo K2 lite's. We are having problems with the wrap slipping down as the edema in her leg goes down. She is also been referred for arterial studies since her ABIs in our clinic were 0.61. She also has clear signs in her legs of venous hypertension. Her wound is actually quite a bit smaller and looks better today. Electronic Signature(s) Signed: 04/15/2023 2:10:50 PM By: Baltazar Najjar MD Entered By: Baltazar Najjar on 04/15/2023 09:38:41 -------------------------------------------------------------------------------- Physical Exam Details Patient Name: Date of Service: Ariana Guzman, Ariana Guzman. 04/15/2023 12:30 PM Medical Record Number: [id number] Patient Account Number: [id number] Date of Birth/Sex: Treating RN: 05/22/1941 (82 y.o. Freddy Finner Primary Care Provider: Bari Edward Other Clinician: Referring Provider: Treating Provider/Extender: RO BSO N, MICHA EL Audrie Lia in Treatment: 1 Constitutional Sitting or standing Blood Pressure is within target range for patient.. Pulse regular and within target range for patient.Marland Kitchen Respirations regular, non-labored and Ariana Guzman, Ariana Guzman ([id number]) [id number] Page 2 of 6 within target range.. Temperature is normal and within the target range for the patient.Marland Kitchen appears in no distress. Cardiovascular Pedal pulses are palpable.. Notes Wound exam; medial left lower leg healthy looking oval-shaped wound. Measuring smaller healthy granulation some debris on the surface but given the improvement in meant dimensions no debridement was done. The patient clearly has  venous hypertension. The edema in her leg is much improved however. 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 Electronic Signature(s) Signed: 04/15/2023 2:10:50 PM By: Baltazar Najjar MD Entered By: Baltazar Najjar on 04/15/2023 09:41:08 -------------------------------------------------------------------------------- Physician Orders Details Patient Name: Date of Service: Ariana Guzman, Ariana Guzman. 04/15/2023 12:30 PM Medical Record Number: [id number] Patient Account Number: [id number] Date of Birth/Sex: Treating RN: 08-22-1940 (82 y.o. Freddy Finner Primary Care Provider: Bari Edward Other Clinician: Referring Provider: Treating Provider/Extender: RO BSO Dorris Carnes, MICHA EL Audrie Lia in Treatment: 1 Verbal / Phone Orders: No Diagnosis Coding Follow-up Appointments Return Appointment in 1 week. Bathing/ Shower/ Hygiene May shower with wound dressing protected with water repellent cover or cast protector. Anesthetic (Use 'Patient Medications' Section for Anesthetic Order Entry) Lidocaine applied to wound bed Edema Control - Lymphedema / Segmental Compressive Device / Other UrgoK2 LITE Wound Treatment Wound #1 - Lower Leg Wound Laterality: Left, Medial Cleanser: Soap and Water 1 x Per Week/30 Days Discharge Instructions: Gently cleanse wound with antibacterial soap, rinse and pat dry prior to dressing wounds Cleanser: Vashe 5.8 (oz) 1 x Per Week/30 Days Discharge Instructions: Use vashe 5.8 (oz) as directed Prim Dressing: Prisma 4.34 (in) 1 x Per Week/30 Days ary Discharge Instructions: Moisten w/normal saline or sterile water; Cover wound as directed. Do not remove from wound bed. Secondary Dressing: ABD Pad 5x9 (in/in) 1 x Per Week/30 Days Discharge Instructions: Cover with ABD pad Compression Wrap: Urgo K2 Lite, two layer compression system, regular 1 x Per Week/30 Days Electronic Signature(s) Signed: 04/15/2023 12:48:59 PM By: Yevonne Pax RN Signed: 04/15/2023 2:10:50 PM By: Baltazar Najjar MD Entered By: Yevonne Pax on 04/15/2023 09:33:33 Ariana Guzman ([id number]) [id number] Page 3 of 6 -------------------------------------------------------------------------------- Problem List Details Patient Name: Date of Service: Ariana Guzman 04/15/2023 12:30 PM Medical Record Number: [id number] Patient Account Number: [id number] Date of Birth/Sex: Treating RN: May 12, 1941 (82 y.o. 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/23/2024 No Yes I73.89 Other specified peripheral vascular diseases 04/08/2023 No Yes I10 Essential (primary) hypertension 04/08/2023 No Yes D68.69 Other thrombophilia 04/08/2023 No Yes I48.0 Paroxysmal atrial fibrillation 04/08/2023 No Yes Z79.01 Long term (current) use of anticoagulants 04/08/2023 No Yes Inactive Problems Resolved Problems Electronic Signature(s) Signed: 04/15/2023 2:10:50 PM By: Baltazar Najjar MD Entered By: Baltazar Najjar on 04/15/2023 09:36:45 Ariana Guzman ([id number]) [id number] Page 4 of 6 -------------------------------------------------------------------------------- Progress Note Details Patient Name: Date of Service: Ariana Guzman 04/15/2023 12:30 PM Medical Record Number: [id number] Patient Account Number: [id number] Date of Birth/Sex: Treating RN: Jun 13, 1941 (82 y.o. Freddy Finner Primary Care Provider: Bari Edward Other Clinician: Referring Provider: Treating Provider/Extender: RO BSO N, MICHA EL Audrie Lia in Treatment: 1 Subjective History of Present Illness (HPI) Chronic/Inactive Condition: 04-08-2023 patient's ABI on screening in the office was 0.66 although she does have a history of having normal ABIs in 2019. I would like to send her back for formal arterial testing. 04-08-2023 patient presents for initial inspection here in clinic concerning 2 issues 1 is  a skin tear on her right forearm and the other is a wound on her left lower extremity. Subsequently she tells me that the skin tear actually occurred around March 31, 2023 only about a week ago and seems to be doing pretty well. With regard to the leg she tells me that she actually dropped something on her leg which caused a skin tear and this just has not healed she is not exactly sure why. She generally does wear compression socks although she has not been able to do it because of the fact that she has the wound. She has significant varicose veins which have been attempted to be treated previous but not completely resolved. With that being said all in all I think that she is doing well I believe it could be that the reason she is not healing with regard to her leg is simply that she has not been able to achieve good compression not being able to use a compression sock. With that being said her ABI was measuring at 0.66 which I think could also be an concern. I would like to have her formally tested as her last formal arterial study was actually in 2019. Patient does have a history of chronic venous insufficiency, peripheral vascular disease, hypertension, thrombophilia, atrial fibrillation, and long-term use of anticoagulants 8/30; this is a patient with a wound on her left medial lower leg. We have been using Prisma under Urgo K2 lite's. We are having problems with the wrap slipping down as the edema in her leg goes down. She is also been referred for arterial studies since her ABIs in our clinic were 0.61. She also has clear  signs in her legs of venous hypertension. Her wound is actually quite a bit smaller and looks better today. Objective Constitutional Sitting or standing Blood Pressure is within target range for patient.. Pulse regular and within target range for patient.Marland Kitchen Respirations regular, non-labored and within target range.. Temperature is normal and within the target range for the patient.Marland Kitchen appears in no distress. Vitals Time Taken: 12:04 PM, Height: 66 in, Temperature: 97.5 F, Pulse: 76 bpm, Respiratory Rate: 18 breaths/min, Blood Pressure: 180/81 mmHg. Cardiovascular Pedal pulses are palpable.. General Notes: Wound exam; medial left lower leg healthy looking oval-shaped wound. Measuring smaller healthy granulation some debris on the surface but given the improvement in meant dimensions no debridement was done. The patient clearly has venous hypertension. The edema in her leg is much improved however. 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 Integumentary (Hair, Skin) Wound #1 status is Open. Original cause of wound was Trauma. The date acquired was: 12/31/2022. The wound has been in treatment 1 weeks. The wound is located on the Left,Medial Lower Leg. The wound measures 2cm length x 0.5cm width x 0.1cm depth; 0.785cm^2 area and 0.079cm^3 volume. There is Fat Layer (Subcutaneous Tissue) exposed. There is no tunneling or undermining noted. There is a medium amount of serosanguineous drainage noted. There is small (1-33%) red, pink granulation within the wound bed. There is a medium (34-66%) amount of necrotic tissue within the wound bed including Adherent Slough. Wound #2 status is Open. Original cause of wound was Skin T ear/Laceration. The date acquired was: 03/31/2023. The wound has been in treatment 1 weeks. The wound is located on the Right,Posterior Forearm. The wound measures 0cm length x 0cm width x 0cm depth; 0cm^2 area and 0cm^3 volume. There is no tunneling or undermining noted. There is a none  present amount of drainage noted. There  is no granulation within the wound bed. There is no necrotic tissue within the wound bed. 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 Bathing/ Shower/ Hygiene: May shower with wound dressing protected with water repellent cover or cast protector. Anesthetic (Use 'Patient Medications' Section for Anesthetic Order Entry): Lidocaine applied to wound bed Edema Control - Lymphedema / Segmental Compressive Device / Other: UrgoK2 LITE WOUND #1: - Lower Leg Wound Laterality: Left, Medial Cleanser: Soap and Water 1 x Per Week/30 Days Discharge Instructions: Gently cleanse wound with antibacterial soap, rinse and pat dry prior to dressing wounds Cleanser: Vashe 5.8 (oz) 1 x Per Week/30 Days Discharge Instructions: Use vashe 5.8 (oz) as directed Prim Dressing: Prisma 4.34 (in) 1 x Per Week/30 Days ary Discharge Instructions: Moisten w/normal saline or sterile water; Cover wound as directed. Do not remove from wound bed. Secondary Dressing: ABD Pad 5x9 (in/in) 1 x Per Week/30 Days Discharge Instructions: Cover with ABD pad Com pression Wrap: Urgo K2 Lite, two layer compression system, regular 1 x Per Week/30 Days 1. At this point I think we can reapply the Urgo K2 lite's to try to anchor them superiorly with Unna wrap. Continuing with the Prisma to the wound which seems to be having good effect 2. The patient has  not yet had vein and vascular review apparently this is booked for September 16. Her ABIs in our clinic were only 0.66 3. The patient clearly has venous hypertension probably secondary lymphedema. I wonder whether vein and vascular will look at this as well. Electronic Signature(s) Signed: 04/15/2023 2:10:50 PM By: Baltazar Najjar MD Entered By: Baltazar Najjar on 04/15/2023 09:42:47 -------------------------------------------------------------------------------- SuperBill Details Patient Name: Date of Service: Ariana Guzman 04/15/2023 Medical Record Number: [id number] Patient Account Number: [id number] Date of Birth/Sex: Treating RN: 12-06-1940 (81 y.o. Freddy Finner Primary Care Provider: Bari Edward Other Clinician: Referring Provider: Treating Provider/Extender: RO BSO N, MICHA EL Audrie Lia in Treatment: 1 Diagnosis Coding ICD-10 Codes Code Description [phone] Laceration without foreign body of right forearm, initial encounter Ariana Guzman, Ariana Guzman ([id number]) [phone].pdf Page 6 of 6 [phone] Chronic venous hypertension (idiopathic) with ulcer and inflammation of left lower extremity L97.822 Non-pressure chronic ulcer of other part of left lower leg with fat layer exposed I73.89 Other specified peripheral vascular diseases I10 Essential (primary) hypertension D68.69 Other thrombophilia I48.0 Paroxysmal atrial fibrillation Z79.01 Long term (current) use of anticoagulants Facility Procedures : CPT4 Code: [id number] Description: (Facility Use Only) [phone] - APPLY MULTLAY COMPRS LWR LT LEG Modifier: Quantity: 1 Physician Procedures : CPT4 Code Description Modifier [id number] [id number] - WC PHYS LEVEL 3 - EST PT ICD-10 Diagnosis Description I87.332 Chronic venous hypertension (idiopathic) with ulcer and inflammation of left lower extremity L97.822 Non-pressure chronic ulcer of other part  of left lower leg with fat layer exposed I73.89 Other  specified peripheral vascular diseases Quantity: 1 Electronic Signature(s) Signed: 04/15/2023 2:10:50 PM By: Baltazar Najjar MD Entered By: Baltazar Najjar on 04/15/2023 09:43:13

## 2023-04-20 ENCOUNTER — Encounter: Payer: Medicare Other | Attending: Internal Medicine

## 2023-04-20 DIAGNOSIS — I7389 Other specified peripheral vascular diseases: Secondary | ICD-10-CM | POA: Insufficient documentation

## 2023-04-20 DIAGNOSIS — I10 Essential (primary) hypertension: Secondary | ICD-10-CM | POA: Insufficient documentation

## 2023-04-20 DIAGNOSIS — I872 Venous insufficiency (chronic) (peripheral): Secondary | ICD-10-CM | POA: Diagnosis not present

## 2023-04-20 DIAGNOSIS — S51811A Laceration without foreign body of right forearm, initial encounter: Secondary | ICD-10-CM | POA: Insufficient documentation

## 2023-04-20 DIAGNOSIS — D6869 Other thrombophilia: Secondary | ICD-10-CM | POA: Insufficient documentation

## 2023-04-20 DIAGNOSIS — I87332 Chronic venous hypertension (idiopathic) with ulcer and inflammation of left lower extremity: Secondary | ICD-10-CM | POA: Diagnosis not present

## 2023-04-20 DIAGNOSIS — L97822 Non-pressure chronic ulcer of other part of left lower leg with fat layer exposed: Secondary | ICD-10-CM | POA: Diagnosis not present

## 2023-04-20 DIAGNOSIS — X58XXXA Exposure to other specified factors, initial encounter: Secondary | ICD-10-CM | POA: Insufficient documentation

## 2023-04-20 DIAGNOSIS — I480 Paroxysmal atrial fibrillation: Secondary | ICD-10-CM | POA: Insufficient documentation

## 2023-04-20 DIAGNOSIS — Z7901 Long term (current) use of anticoagulants: Secondary | ICD-10-CM | POA: Diagnosis not present

## 2023-04-20 NOTE — Progress Notes (Signed)
KISSEY, CHOU ([id number]) [id number].pdf Page 1 of 3 Visit Report for 04/20/2023 Arrival Information Details Patient Name: Date of Service: Ariana Guzman 04/20/2023 2:30 PM Medical Record Number: [id number] Patient Account Number: [id number] Date of Birth/Sex: Treating RN: [birth year] (82 y.o. [name] Primary Care Nataniel Gasper: Bari Edward Other Clinician: Referring Lindsey Demonte: Treating Yisell Sprunger/Extender: Ethelene Browns in Treatment: 1 Visit Information History Since Last Visit Added or deleted any medications: No Patient Arrived: Dan Humphreys Any new allergies or adverse reactions: No Arrival Time: 14:34 Has Dressing in Place as Prescribed: Yes Accompanied By: son Has Compression in Place as Prescribed: Yes Transfer Assistance: None Pain Present Now: No Patient Identification Verified: Yes Secondary Verification Process Completed: Yes Patient Requires Transmission-Based Precautions: No Patient Has Alerts: Yes Patient Alerts: Patient on Blood Thinner Xarelto Electronic Signature(s) Signed: 04/20/2023 4:29:07 PM By: Midge Aver MSN RN CNS WTA Entered By: Midge Aver on 04/20/2023 13:29:07 -------------------------------------------------------------------------------- Compression Therapy Details Patient Name: Date of Service: Ariana Guzman, Ariana Rathke B. 04/20/2023 2:30 PM Medical Record Number: [id number] Patient Account Number: [id number] Date of Birth/Sex: Treating RN: 12-27-1940 (81 y.o. [name] Primary Care Malvina Schadler: Bari Edward Other Clinician: Referring Glennda Weatherholtz: Treating Tim Wilhide/Extender: Ethelene Browns in Treatment: 1 Compression Therapy Performed for Wound Assessment: Wound #1 Left,Medial Lower Leg Performed By: Clinician Midge Aver, RN Compression Type: Three Emergency planning/management officer) Signed: 04/20/2023 4:29:38 PM By: Midge Aver MSN RN CNS WTA Entered By: Midge Aver on 04/20/2023  13:29:38 Manfred Shirts ([id number]) [id number] Page 2 of 3 -------------------------------------------------------------------------------- Encounter Discharge Information Details Patient Name: Date of Service: Ariana Guzman 04/20/2023 2:30 PM Medical Record Number: [id number] Patient Account Number: [id number] Date of Birth/Sex: Treating RN: Aug 21, 1940 (82 y.o. [name] Primary Care Anita Laguna: Bari Edward Other Clinician: Referring Cormac Wint: Treating Desean Heemstra/Extender: Ethelene Browns in Treatment: 1 Encounter Discharge Information Items Discharge Condition: Stable Ambulatory Status: Cane Discharge Destination: Home Transportation: Private Auto Accompanied By: Edward Jolly Schedule Follow-up Appointment: Yes Clinical Summary of Care: Electronic Signature(s) Signed: 04/20/2023 4:30:45 PM By: Midge Aver MSN RN CNS WTA Entered By: Midge Aver on 04/20/2023 13:30:44 -------------------------------------------------------------------------------- Wound Assessment Details Patient Name: Date of Service: Ariana Guzman, Ariana Rathke B. 04/20/2023 2:30 PM Medical Record Number: [id number] Patient Account Number: [id number] Date of Birth/Sex: Treating RN: Jul 24, 1941 (81 y.o. [name] Primary Care Jams Trickett: Bari Edward Other Clinician: Referring Eulises Kijowski: Treating Jenetta Wease/Extender: Ethelene Browns in Treatment: 1 Wound Status Wound Number: 1 Primary Etiology: Trauma, Other Wound Location: Left, Medial Lower Leg Wound Status: Open Wounding Event: Trauma Comorbid History: Aspiration, Hypertension, Neuropathy Date Acquired: 12/31/2022 Weeks Of Treatment: 1 Clustered Wound: No Wound Measurements Length: (cm) 2 Width: (cm) 0.5 Depth: (cm) 0.1 Area: (cm) 0.785 Volume: (cm) 0.079 % Reduction in Area: 50% % Reduction in Volume: 74.8% Wound Description Classification: Full Thickness Without Exposed  Support FLORIAN, LITE B ([id number]) Exudate Amount: Medium Exudate Type: Serosanguineous Exudate Color: red, brown Structures Foul Odor After Cleansing: No [id number] Page 3 of 3 Slough/Fibrino Yes Wound Bed Granulation Amount: Small (1-33%) Exposed Structure Granulation Quality: Red, Pink Fascia Exposed: No Necrotic Amount: Medium (34-66%) Fat Layer (Subcutaneous Tissue) Exposed: Yes Necrotic Quality: Adherent Slough Tendon Exposed: No Muscle Exposed: No Joint Exposed: No Bone Exposed: No Treatment Notes Wound #1 (Lower Leg) Wound Laterality: Left, Medial Cleanser Soap and Water Discharge Instruction: Gently cleanse wound with antibacterial soap, rinse and pat dry prior to dressing wounds Vashe 5.8 (oz) Discharge Instruction:  Use vashe 5.8 (oz) as directed Peri-Wound Care Topical Primary Dressing Prisma 4.34 (in) Discharge Instruction: Moisten w/normal saline or sterile water; Cover wound as directed. Do not remove from wound bed. Secondary Dressing ABD Pad 5x9 (in/in) Discharge Instruction: Cover with ABD pad Secured With Compression Wrap Urgo K2 Lite, two layer compression system, regular Compression Stockings Add-Ons Electronic Signature(s) Signed: 04/20/2023 4:29:21 PM By: Midge Aver MSN RN CNS WTA Entered By: Midge Aver on 04/20/2023 13:29:21

## 2023-04-22 ENCOUNTER — Encounter (INDEPENDENT_AMBULATORY_CARE_PROVIDER_SITE_OTHER): Payer: Medicare Other

## 2023-04-22 ENCOUNTER — Encounter (INDEPENDENT_AMBULATORY_CARE_PROVIDER_SITE_OTHER): Payer: Medicare Other | Admitting: Vascular Surgery

## 2023-04-22 NOTE — Progress Notes (Signed)
AYRIANNA, MCDERMITT ([id number]) [id number].pdf Page 1 of 2 Visit Report for 04/20/2023 Physician Orders Details Patient Name: Date of Service: Ariana Guzman 04/20/2023 2:30 PM Medical Record Number: [id number] Patient Account Number: [id number] Date of Birth/Sex: Treating RN: December 12, 1940 (82 y.o. [name] Primary Care Provider: Bari Edward Other Clinician: Referring Provider: Treating Provider/Extender: Ethelene Browns in Treatment: 1 Verbal / Phone Orders: No Diagnosis Coding Follow-up Appointments Return Appointment in 1 week. Bathing/ Shower/ Hygiene May shower with wound dressing protected with water repellent cover or cast protector. Anesthetic (Use 'Patient Medications' Section for Anesthetic Order Entry) Lidocaine applied to wound bed Edema Control - Lymphedema / Segmental Compressive Device / Other UrgoK2 LITE Wound Treatment Wound #1 - Lower Leg Wound Laterality: Left, Medial Cleanser: Soap and Water 1 x Per Week/30 Days Discharge Instructions: Gently cleanse wound with antibacterial soap, rinse and pat dry prior to dressing wounds Cleanser: Vashe 5.8 (oz) 1 x Per Week/30 Days Discharge Instructions: Use vashe 5.8 (oz) as directed Prim Dressing: Prisma 4.34 (in) 1 x Per Week/30 Days ary Discharge Instructions: Moisten w/normal saline or sterile water; Cover wound as directed. Do not remove from wound bed. Secondary Dressing: ABD Pad 5x9 (in/in) 1 x Per Week/30 Days Discharge Instructions: Cover with ABD pad Compression Wrap: Urgo K2 Lite, two layer compression system, regular 1 x Per Week/30 Days Electronic Signature(s) Signed: 04/20/2023 4:30:19 PM By: Midge Aver MSN RN CNS WTA Signed: 04/21/2023 5:30:08 PM By: Geralyn Corwin DO Entered By: Midge Aver on 04/20/2023 13:30:18 Manfred Shirts ([id number]) [id number] Page 2 of  2 -------------------------------------------------------------------------------- SuperBill Details Patient Name: Date of Service: Ariana Guzman 04/20/2023 Medical Record Number: [id number] Patient Account Number: [id number] Date of Birth/Sex: Treating RN: July 11, 1941 (82 y.o. [name] Primary Care Provider: Bari Edward Other Clinician: Referring Provider: Treating Provider/Extender: Ethelene Browns in Treatment: 1 Diagnosis Coding ICD-10 Codes Code Description [phone] Laceration without foreign body of right forearm, initial encounter I87.332 Chronic venous hypertension (idiopathic) with ulcer and inflammation of left lower extremity L97.822 Non-pressure chronic ulcer of other part of left lower leg with fat layer exposed I73.89 Other specified peripheral vascular diseases I10 Essential (primary) hypertension D68.69 Other thrombophilia I48.0 Paroxysmal atrial fibrillation Z79.01 Long term (current) use of anticoagulants Facility Procedures : CPT4 Code: [id number] Description: (Facility Use Only) [phone] - APPLY MULTLAY COMPRS LWR LT LEG Modifier: Quantity: 1 Electronic Signature(s) Signed: 04/20/2023 4:31:23 PM By: Midge Aver MSN RN CNS WTA Signed: 04/21/2023 5:30:08 PM By: Geralyn Corwin DO Entered By: Midge Aver on 04/20/2023 13:31:23

## 2023-04-26 ENCOUNTER — Encounter: Payer: Medicare Other | Admitting: Physician Assistant

## 2023-04-26 DIAGNOSIS — D6869 Other thrombophilia: Secondary | ICD-10-CM | POA: Diagnosis not present

## 2023-04-26 DIAGNOSIS — L97822 Non-pressure chronic ulcer of other part of left lower leg with fat layer exposed: Secondary | ICD-10-CM | POA: Diagnosis not present

## 2023-04-26 DIAGNOSIS — I87332 Chronic venous hypertension (idiopathic) with ulcer and inflammation of left lower extremity: Secondary | ICD-10-CM | POA: Diagnosis not present

## 2023-04-26 DIAGNOSIS — I10 Essential (primary) hypertension: Secondary | ICD-10-CM | POA: Diagnosis not present

## 2023-04-26 DIAGNOSIS — S51811A Laceration without foreign body of right forearm, initial encounter: Secondary | ICD-10-CM | POA: Diagnosis not present

## 2023-04-26 DIAGNOSIS — I872 Venous insufficiency (chronic) (peripheral): Secondary | ICD-10-CM | POA: Diagnosis not present

## 2023-04-26 DIAGNOSIS — I7389 Other specified peripheral vascular diseases: Secondary | ICD-10-CM | POA: Diagnosis not present

## 2023-04-26 DIAGNOSIS — I480 Paroxysmal atrial fibrillation: Secondary | ICD-10-CM | POA: Diagnosis not present

## 2023-04-26 DIAGNOSIS — Z7901 Long term (current) use of anticoagulants: Secondary | ICD-10-CM | POA: Diagnosis not present

## 2023-04-26 NOTE — Progress Notes (Signed)
Ariana Guzman ([id number]) [id number] Page 1 of 7 Visit Report for 04/26/2023 Chief Complaint Document Details Patient Name: Date of Service: Ariana Guzman 04/26/2023 12:15 PM Medical Record Number: [id number] Patient Account Number: [id number] Date of Birth/Sex: Treating RN: [birth year] (82 y.o. Ariana Guzman Primary Care Provider: Bari Edward Other Clinician: Referring Provider: Treating Provider/Extender: Francee Piccolo in Treatment: 2 Information Obtained from: Patient Chief Complaint Left leg and right forearm ulcers Electronic Signature(s) Signed: 04/26/2023 12:29:58 PM By: Allen Derry PA-C Entered By: Allen Derry on 04/26/2023 09:29:57 -------------------------------------------------------------------------------- HPI Details Patient Name: Date of Service: Ariana Guzman, Ariana ZEL B. 04/26/2023 12:15 PM Medical Record Number: [id number] Patient Account Number: [id number] Date of Birth/Sex: Treating RN: [birth year] (81 y.o. Ariana Guzman Primary Care Provider: Bari Edward Other Clinician: Referring Provider: Treating Provider/Extender: Francee Piccolo in Treatment: 2 History of Present Illness Chronic/Inactive Conditions Condition 1: 04-08-2023 patient's ABI on screening in the office was 0.66 although she does have a history of having normal ABIs in 2019. I would like to send her back for formal arterial testing. HPI Description: 04-08-2023 patient presents for initial inspection here in clinic concerning 2 issues 1 is a skin tear on her right forearm and the other is a wound on her left lower extremity. Subsequently she tells me that the skin tear actually occurred around March 31, 2023 only about a week ago and seems to be doing pretty well. With regard to the leg she tells me that she actually dropped something on her leg which caused a skin tear and this just has not healed she is not exactly sure why.  She generally does wear compression socks although she has not been able to do it because of the fact that she has the wound. She has significant varicose veins which have been attempted to be treated previous but not completely resolved. With that being said all in all I think that she is doing well I believe it could be that the reason she is not healing with regard to her leg is simply that she has not been able to achieve good compression not being able to use a compression sock. With that being said her ABI was measuring at 0.66 which I think could also be an concern. I would like to have her formally tested as her last formal arterial study was actually in 2019. Patient does have a history of chronic venous insufficiency, peripheral vascular disease, hypertension, thrombophilia, atrial fibrillation, and long-term use of anticoagulants 8/30; this is a patient with a wound on her left medial lower leg. We have been using Prisma under Urgo K2 lite's. We are having problems with the wrap slipping down as the edema in her leg goes down. She is also been referred for arterial studies since her ABIs in our clinic were 0.61. She also has clear signs in her MAYDELLE, KINDLER ([id number]) [id number] Page 2 of 7 legs of venous hypertension. Her wound is actually quite a bit smaller and looks better today. 04-26-2023 upon evaluation today patient appears to be doing well currently in regard to her wound which is actually showing signs of excellent improvement. Fortunately I do not see any evidence of worsening overall and I believe that the patient is making good headway in fact the wound looks to be significantly smaller even compared to last week when I saw her. Electronic Signature(s) Signed: 04/26/2023 5:14:14 PM By: Allen Derry PA-C Entered By: Allen Derry on  04/26/2023 14:14:14 -------------------------------------------------------------------------------- Physical Exam  Details Patient Name: Date of Service: Ariana Guzman 04/26/2023 12:15 PM Medical Record Number: [id number] Patient Account Number: [id number] Date of Birth/Sex: Treating RN: 1941/06/03 (82 y.o. Ariana Guzman Primary Care Provider: Bari Edward Other Clinician: Referring Provider: Treating Provider/Extender: Francee Piccolo in Treatment: 2 Constitutional Well-nourished and well-hydrated in no acute distress. Respiratory normal breathing without difficulty. Psychiatric this patient is able to make decisions and demonstrates good insight into disease process. Alert and Oriented x 3. pleasant and cooperative. Notes Upon inspection patient's wound bed actually showed signs of good granulation physician at this point. Fortunately I do not see any signs of worsening overall and I believe that the patient is making excellent headway towards complete closure which is great news. I am extremely pleased with what we are seeing. Electronic Signature(s) Signed: 04/26/2023 5:14:30 PM By: Allen Derry PA-C Entered By: Allen Derry on 04/26/2023 14:14:30 -------------------------------------------------------------------------------- Physician Orders Details Patient Name: Date of Service: Ariana Guzman, Ariana ZEL B. 04/26/2023 12:15 PM Medical Record Number: [id number] Patient Account Number: [id number] Date of Birth/Sex: Treating RN: 03-06-1941 (82 y.o. Ariana Guzman Primary Care Provider: Bari Edward Other Clinician: Referring Provider: Treating Provider/Extender: Francee Piccolo in Treatment: 2 Verbal / Phone Orders: No CARNATION, KRAS ([id number]) [id number] Page 3 of 7 Diagnosis Coding ICD-10 Coding Code Description [id number] Laceration without foreign body of right forearm, initial encounter I87.332 Chronic venous hypertension (idiopathic) with ulcer and inflammation of left lower extremity L97.822 Non-pressure chronic ulcer  of other part of left lower leg with fat layer exposed I73.89 Other specified peripheral vascular diseases I10 Essential (primary) hypertension D68.69 Other thrombophilia I48.0 Paroxysmal atrial fibrillation Z79.01 Long term (current) use of anticoagulants Follow-up Appointments Return Appointment in 1 week. Bathing/ [employer] wounds with antibacterial soap and water. May shower with wound dressing protected with water repellent cover or cast protector. Anesthetic (Use 'Patient Medications' Section for Anesthetic Order Entry) Lidocaine applied to wound bed Edema Control - Lymphedema / Segmental Compressive Device / Other UrgoK2 LITE Wound Treatment Wound #1 - Lower Leg Wound Laterality: Left, Medial Cleanser: Soap and Water 1 x Per Week/30 Days Discharge Instructions: Gently cleanse wound with antibacterial soap, rinse and pat dry prior to dressing wounds Cleanser: Vashe 5.8 (oz) 1 x Per Week/30 Days Discharge Instructions: Use vashe 5.8 (oz) as directed Prim Dressing: Prisma 4.34 (in) 1 x Per Week/30 Days ary Discharge Instructions: Moisten w/normal saline or sterile water; Cover wound as directed. Do not remove from wound bed. Secondary Dressing: ABD Pad 5x9 (in/in) 1 x Per Week/30 Days Discharge Instructions: Cover with ABD pad Compression Wrap: Urgo K2 Lite, two layer compression system, regular 1 x Per Week/30 Days Electronic Signature(s) Signed: 04/26/2023 4:09:38 PM By: Midge Aver MSN RN CNS WTA Signed: 04/28/2023 4:31:22 PM By: Allen Derry PA-C Entered By: Midge Aver on 04/26/2023 09:59:37 -------------------------------------------------------------------------------- Problem List Details Patient Name: Date of Service: Ariana Guzman, Ariana Guzman B. 04/26/2023 12:15 PM Medical Record Number: [id number] Patient Account Number: [id number] Date of Birth/Sex: Treating RN: 1940-12-01 (82 y.o. Ariana Guzman Primary Care Provider: Bari Edward Other  Clinician: Referring Provider: Treating Provider/Extender: Francee Piccolo in Treatment: 2 Active Problems Guzman, Ariana ([id number]) [id number] Page 4 of 7 ICD-10 Encounter Code Description Active Date MDM Diagnosis S51.811A Laceration without foreign body of right forearm, initial encounter 04/08/2023 No Yes I87.332 Chronic venous hypertension (idiopathic) with ulcer and inflammation of left 04/08/2023  No Yes lower extremity L97.822 Non-pressure chronic ulcer of other part of left lower leg with fat layer exposed8/23/2024 No Yes I73.89 Other specified peripheral vascular diseases 04/08/2023 No Yes I10 Essential (primary) hypertension 04/08/2023 No Yes D68.69 Other thrombophilia 04/08/2023 No Yes I48.0 Paroxysmal atrial fibrillation 04/08/2023 No Yes Z79.01 Long term (current) use of anticoagulants 04/08/2023 No Yes Inactive Problems Resolved Problems Electronic Signature(s) Signed: 04/26/2023 12:29:55 PM By: Allen Derry PA-C Entered By: Allen Derry on 04/26/2023 09:29:55 -------------------------------------------------------------------------------- Progress Note Details Patient Name: Date of Service: Ariana Guzman, Ariana ZEL B. 04/26/2023 12:15 PM Medical Record Number: [id number] Patient Account Number: [id number] Date of Birth/Sex: Treating RN: 1941-05-30 (82 y.o. Ariana Guzman Primary Care Provider: Bari Edward Other Clinician: Referring Provider: Treating Provider/Extender: Francee Piccolo in Treatment: 2 Subjective Chief Complaint Information obtained from Patient Left leg and right forearm ulcers ROZETTA, VANHOOK B ([id number]) [id number] Page 5 of 7 History of Present Illness (HPI) Chronic/Inactive Condition: 04-08-2023 patient's ABI on screening in the office was 0.66 although she does have a history of having normal ABIs in 2019. I would like to send her back for formal arterial  testing. 04-08-2023 patient presents for initial inspection here in clinic concerning 2 issues 1 is a skin tear on her right forearm and the other is a wound on her left lower extremity. Subsequently she tells me that the skin tear actually occurred around March 31, 2023 only about a week ago and seems to be doing pretty well. With regard to the leg she tells me that she actually dropped something on her leg which caused a skin tear and this just has not healed she is not exactly sure why. She generally does wear compression socks although she has not been able to do it because of the fact that she has the wound. She has significant varicose veins which have been attempted to be treated previous but not completely resolved. With that being said all in all I think that she is doing well I believe it could be that the reason she is not healing with regard to her leg is simply that she has not been able to achieve good compression not being able to use a compression sock. With that being said her ABI was measuring at 0.66 which I think could also be an concern. I would like to have her formally tested as her last formal arterial study was actually in 2019. Patient does have a history of chronic venous insufficiency, peripheral vascular disease, hypertension, thrombophilia, atrial fibrillation, and long-term use of anticoagulants 8/30; this is a patient with a wound on her left medial lower leg. We have been using Prisma under Urgo K2 lite's. We are having problems with the wrap slipping down as the edema in her leg goes down. She is also been referred for arterial studies since her ABIs in our clinic were 0.61. She also has clear signs in her legs of venous hypertension. Her wound is actually quite a bit smaller and looks better today. 04-26-2023 upon evaluation today patient appears to be doing well currently in regard to her wound which is actually showing signs of excellent improvement. Fortunately I  do not see any evidence of worsening overall and I believe that the patient is making good headway in fact the wound looks to be significantly smaller even compared to last week when I saw her. Objective Constitutional Well-nourished and well-hydrated in no acute distress. Vitals Time Taken: 12:21 PM, Height: 66 in, Source: Stated,  Temperature: 97.8 F, Pulse: 53 bpm, Respiratory Rate: 18 breaths/min, Blood Pressure: 174/84 mmHg. Respiratory normal breathing without difficulty. Psychiatric this patient is able to make decisions and demonstrates good insight into disease process. Alert and Oriented x 3. pleasant and cooperative. General Notes: Upon inspection patient's wound bed actually showed signs of good granulation physician at this point. Fortunately I do not see any signs of worsening overall and I believe that the patient is making excellent headway towards complete closure which is great news. I am extremely pleased with what we are seeing. Integumentary (Hair, Skin) Wound #1 status is Open. Original cause of wound was Trauma. The date acquired was: 12/31/2022. The wound has been in treatment 2 weeks. The wound is located on the Left,Medial Lower Leg. The wound measures 0.8cm length x 0.2cm width x 0.1cm depth; 0.126cm^2 area and 0.013cm^3 volume. There is Fat Layer (Subcutaneous Tissue) exposed. There is a medium amount of serosanguineous drainage noted. There is small (1-33%) red, pink granulation within the wound bed. There is a medium (34-66%) amount of necrotic tissue within the wound bed including Adherent Slough. 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 There was a Three Layer Compression Therapy Procedure by Midge Aver, RN. DAYLYN, UGARTE ([id number]) [id number] Page 6 of 7 Post procedure Diagnosis Wound #1: Same as Pre-Procedure Plan Follow-up Appointments: Return Appointment in 1 week. Bathing/ Shower/ Hygiene: Wash wounds with antibacterial soap and water. May shower with wound dressing protected with water repellent cover or cast protector. Anesthetic (Use 'Patient Medications' Section for Anesthetic Order Entry): Lidocaine applied to wound bed Edema Control - Lymphedema / Segmental Compressive Device / Other: UrgoK2 LITE WOUND #1: - Lower Leg Wound Laterality: Left, Medial Cleanser: Soap and Water 1 x Per Week/30 Days Discharge Instructions: Gently cleanse wound with antibacterial soap, rinse and pat dry prior to dressing wounds Cleanser: Vashe 5.8 (oz) 1 x Per Week/30 Days Discharge Instructions: Use vashe 5.8 (oz) as directed Prim Dressing: Prisma 4.34 (in) 1 x Per Week/30 Days ary Discharge Instructions: Moisten w/normal saline or sterile water; Cover wound as directed. Do not remove from wound bed. Secondary Dressing: ABD Pad 5x9 (in/in) 1 x Per Week/30 Days Discharge Instructions: Cover with ABD pad Com pression Wrap: Urgo K2 Lite, two layer compression system, regular 1 x Per Week/30 Days 1. I am good recommend that the patient should continue to elevate her legs is much as possible. Will also get a continue with the compression wraps which are doing a really good job. 2. I am going to recommend as well that we continue with the Prisma followed by the ABD pad and the Urgo K2 lite compression wrap. We will see patient back for reevaluation in 1 week here in the clinic. If anything worsens  or changes patient will contact our office for additional recommendations. Electronic Signature(s) Signed: 04/26/2023 5:15:06 PM By: Allen Derry PA-C Entered By: Allen Derry on 04/26/2023 14:15:06 -------------------------------------------------------------------------------- SuperBill Details Patient Name: Date of Service: Ariana Guzman, Ariana ZEL B. 04/26/2023 Medical Record Number: [id number] Patient Account Number: [id number] Date of Birth/Sex: Treating RN:  1940/12/27 (82 y.o. Ariana Guzman Primary Care Provider: Bari Edward Other Clinician: Referring Provider: Treating Provider/Extender: Francee Piccolo in Treatment: 2 Diagnosis Coding ICD-10 Codes Code Description [phone] Laceration without foreign body of right forearm, initial encounter I87.332 Chronic venous hypertension (idiopathic) with ulcer and inflammation of left lower extremity L97.822 Non-pressure chronic ulcer of other part of left lower leg with fat layer exposed I73.89 Other specified peripheral vascular diseases I10 Essential (primary) hypertension D68.69 Other thrombophilia I48.0 Paroxysmal atrial fibrillation LEDORA, Ariana B ([id number]) [id number] Page 7 of 7 Z79.01 Long term (current) use of anticoagulants Facility Procedures : CPT4 Code: [id number] Description: (Facility Use Only) [phone] - APPLY MULTLAY COMPRS LWR LT LEG Modifier: Quantity: 1 Physician Procedures : CPT4 Code Description Modifier [id number] - WC PHYS LEVEL 3 - EST PT ICD-10 Diagnosis Description S51.811A Laceration without foreign body of right forearm, initial encounter I87.332 Chronic venous hypertension (idiopathic) with ulcer and  inflammation of left lower extremity L97.822 Non-pressure chronic ulcer of other part of left lower leg with fat layer exposed I73.89 Other specified peripheral vascular diseases Quantity: 1 Electronic Signature(s) Signed: 04/26/2023 5:17:08 PM By: Allen Derry  PA-C Previous Signature: 04/26/2023 4:09:38 PM Version By: Midge Aver MSN RN CNS WTA Entered By: Allen Derry on 04/26/2023 14:17:08

## 2023-04-26 NOTE — Progress Notes (Signed)
Ariana Guzman ([id number]) [id number] Page 1 of 10 Visit Report for 04/26/2023 Arrival Information Details Patient Name: Date of Service: Ariana Guzman 04/26/2023 12:15 PM Medical Record Number: [id number] Patient Account Number: [id number] Date of Birth/Sex: Treating RN: 07/13/1941 (82 y.o. [name] Primary Care Vash Quezada: Bari Edward Other Clinician: Referring Roben Schliep: Treating Takari Duncombe/Extender: Francee Piccolo in Treatment: 2 Visit Information History Since Last Visit Added or deleted any medications: No Patient Arrived: Dan Humphreys Any new allergies or adverse reactions: No Arrival Time: 12:17 Has Dressing in Place as Prescribed: Yes Accompanied By: son Has Compression in Place as Prescribed: Yes Transfer Assistance: None Pain Present Now: No Patient Identification Verified: Yes Secondary Verification Process Completed: Yes Patient Requires Transmission-Based Precautions: No Patient Has Alerts: Yes Patient Alerts: Patient on Blood Thinner Xarelto Electronic Signature(s) Signed: 04/26/2023 4:09:38 PM By: Midge Aver MSN RN CNS WTA Entered By: Midge Aver on 04/26/2023 09:21:18 -------------------------------------------------------------------------------- Clinic Level of Care Assessment Details Patient Name: Date of Service: Ariana CE, [name] 04/26/2023 12:15 PM Medical Record Number: [id number] Patient Account Number: [id number] Date of Birth/Sex: Treating RN: 1941-02-23 (82 y.o. [name] Primary Care Aime Meloche: Bari Edward Other Clinician: Referring Bethel Gaglio: Treating Sirenity Shew/Extender: Francee Piccolo in Treatment: 2 Clinic Level of Care Assessment Items TOOL 1 Quantity Score []  - 0 Use when EandM and Procedure is performed on INITIAL visit ASSESSMENTS - Nursing Assessment / Reassessment []  - 0 General Physical Exam (combine w/ comprehensive assessment (listed just below) when  performed on new pt. evals) []  - 0 Comprehensive Assessment (HX, ROS, Risk Assessments, Wounds Hx, etc.) ASSESSMENTS - Wound and Skin Assessment / Reassessment []  - 0 Dermatologic / Skin Assessment (not related to wound area) NAEEMAH, ROSSELOT B ([id number]) [id number] Page 2 of 10 ASSESSMENTS - Ostomy and/or Continence Assessment and Care []  - 0 Incontinence Assessment and Management []  - 0 Ostomy Care Assessment and Management (repouching, etc.) PROCESS - Coordination of Care []  - 0 Simple Patient / Family Education for ongoing care []  - 0 Complex (extensive) Patient / Family Education for ongoing care []  - 0 Staff obtains Chiropractor, Records, T Results / Process Orders est []  - 0 Staff telephones HHA, Nursing Homes / Clarify orders / etc []  - 0 Routine Transfer to another Facility (non-emergent condition) []  - 0 Routine Hospital Admission (non-emergent condition) []  - 0 New Admissions / Manufacturing engineer / Ordering NPWT Apligraf, etc. , []  - 0 Emergency Hospital Admission (emergent condition) PROCESS - Special Needs []  - 0 Pediatric / Minor Patient Management []  - 0 Isolation Patient Management []  - 0 Hearing / Language / Visual special needs []  - 0 Assessment of Community assistance (transportation, D/C planning, etc.) []  - 0 Additional assistance / Altered mentation []  - 0 Support Surface(s) Assessment (bed, cushion, seat, etc.) INTERVENTIONS - Miscellaneous []  - 0 External ear exam []  - 0 Patient Transfer (multiple staff / Nurse, adult / Similar devices) []  - 0 Simple Staple / Suture removal (25 or less) []  - 0 Complex Staple / Suture removal (26 or more) []  - 0 Hypo/Hyperglycemic Management (do not check if billed separately) []  - 0 Ankle / Brachial Index (ABI) - do not check if billed separately Has the patient been seen at the hospital within the last three years: Yes Total Score: 0 Level Of Care: ____ Electronic  Signature(s) Signed: 04/26/2023 4:09:38 PM By: Midge Aver MSN RN CNS WTA Entered By: Midge Aver on 04/26/2023 09:59:48 -------------------------------------------------------------------------------- Compression Therapy  Details Patient Name: Date of Service: Ariana Guzman 04/26/2023 12:15 PM Medical Record Number: [id number] Patient Account Number: [id number] Date of Birth/Sex: Treating RN: [birth year] (82 y.o. [name] Primary Care Kareena Arrambide: Bari Edward Other Clinician: Referring Renee Erb: Treating Alizea Pell/Extender: Francee Piccolo in Treatment: 2 Compression Therapy Performed for Wound Assessment: Wound #1 Left,Medial Lower Leg Performed By: Clinician Midge Aver, RN Compression Type: [address] JOYDAN, BASTEDO ([id number]) [id number] Page 3 of 10 Post Procedure Diagnosis Same as Pre-procedure Electronic Signature(s) Signed: 04/26/2023 4:09:38 PM By: Midge Aver MSN RN CNS WTA Entered By: Midge Aver on 04/26/2023 09:59:23 -------------------------------------------------------------------------------- Encounter Discharge Information Details Patient Name: Date of Service: Ariana CE, [name] 04/26/2023 12:15 PM Medical Record Number: [id number] Patient Account Number: [id number] Date of Birth/Sex: Treating RN: 1941/02/18 (81 y.o. [name] Primary Care Eldin Bonsell: Bari Edward Other Clinician: Referring Summer Parthasarathy: Treating Racine Erby/Extender: Francee Piccolo in Treatment: 2 Encounter Discharge Information Items Discharge Condition: Stable Ambulatory Status: Walker Discharge Destination: Home Transportation: Private Auto Accompanied By: son Schedule Follow-up Appointment: Yes Clinical Summary of Care: Electronic Signature(s) Signed: 04/26/2023 4:09:38 PM By: Midge Aver MSN RN CNS WTA Entered By: Midge Aver on 04/26/2023  10:01:31 -------------------------------------------------------------------------------- Lower Extremity Assessment Details Patient Name: Date of Service: Ariana CE, [name] 04/26/2023 12:15 PM Medical Record Number: [id number] Patient Account Number: [id number] Date of Birth/Sex: Treating RN: December 08, 1940 (81 y.o. [name] Primary Care Jeromey Kruer: Bari Edward Other Clinician: Referring Karalee Hauter: Treating Akire Rennert/Extender: Tonye Pearson Weeks in Treatment: 2 Edema Assessment Assessed: [Left: No] Franne Forts: No] [Left: Edema] [Right: :] MAYELY, SPROLES B ([id number]) [id number] Page 4 of 10 Left: Right: Point of Measurement: 36 cm From Medial Instep 40.5 cm Ankle Left: Right: Point of Measurement: 13 cm From Medial Instep 21.5 cm Vascular Assessment Pulses: Dorsalis Pedis Palpable: [Left:Yes] Extremity colors, hair growth, and conditions: Extremity Color: [Left:Red] Hair Growth on Extremity: [Left:No] Temperature of Extremity: [Left:Warm] Capillary Refill: [Left:< 3 seconds] Dependent Rubor: [Left:No] Blanched when Elevated: [Left:No No] Toe Nail Assessment Left: Right: Thick: No Discolored: No Deformed: No Improper Length and Hygiene: No Electronic Signature(s) Signed: 04/26/2023 4:09:38 PM By: Midge Aver MSN RN CNS WTA Entered By: Midge Aver on 04/26/2023 09:34:54 -------------------------------------------------------------------------------- Multi Wound Chart Details Patient Name: Date of Service: Ariana CE, [name] 04/26/2023 12:15 PM Medical Record Number: [id number] Patient Account Number: [id number] Date of Birth/Sex: Treating RN: April 29, 1941 (82 y.o. [name] Primary Care Eschol Auxier: Bari Edward Other Clinician: Referring Danial Hlavac: Treating Clark Clowdus/Extender: Francee Piccolo in Treatment: 2 Vital Signs Height(in): 66 Pulse(bpm): 53 Weight(lbs): Blood Pressure(mmHg): 174/84 Body  Mass Index(BMI): Temperature(F): 97.8 Respiratory Rate(breaths/min): 18 [1:Photos:] [N/A:N/A] ZO, MARET ([id number]) [1:Left, Medial Lower Leg Wound Location: Trauma Wounding Event: Trauma, Other Primary Etiology: Aspiration, Hypertension, Neuropathy N/A Comorbid History: 12/31/2022 Date Acquired: 2 Weeks of Treatment: Open Wound Status: No Wound Recurrence: 0.8x0.2x0.1  Measurements L x W x D (cm) 0.126 A (cm) : rea 0.013 Volume (cm) : 92.00% % Reduction in A rea: 95.90% % Reduction in Volume: Full Thickness Without Exposed Classification: Support Structures Medium Exudate Amount: Serosanguineous Exudate Type: red,  brown Exudate Color: Small (1-33%) Granulation Amount: Red, Pink Granulation Quality: Medium (34-66%) Necrotic Amount: Fat Layer (Subcutaneous Tissue): Yes N/A Exposed Structures: Fascia: No Tendon: No Muscle: No Joint: No Bone: No] [N/A:N/A N/A N/A N/A  N/A N/A N/A N/A N/A N/A N/A N/A N/A N/A N/A N/A N/A N/A N/A] Treatment Notes  Electronic Signature(s) Signed: 04/26/2023 4:09:38 PM By: Midge Aver MSN RN CNS WTA Entered By: Midge Aver on 04/26/2023 09:35:25 -------------------------------------------------------------------------------- Multi-Disciplinary Care Plan Details Patient Name: Date of Service: Ariana CE, [name] 04/26/2023 12:15 PM Medical Record Number: [id number] Patient Account Number: [id number] Date of Birth/Sex: Treating RN: [birth year] (82 y.o. [name] Primary Care Jaeger Trueheart: Bari Edward Other Clinician: Referring Ameliyah Sarno: Treating Jesselyn Rask/Extender: Francee Piccolo in Treatment: 2 Active Inactive Necrotic Tissue Nursing Diagnoses: Impaired tissue integrity related to necrotic/devitalized tissue Knowledge deficit related to management of necrotic/devitalized tissue Goals: Necrotic/devitalized tissue will be minimized in the wound bed Date Initiated: 04/08/2023 Target Resolution Date: 05/09/2023 Goal Status:  Active Patient/caregiver will verbalize understanding of reason and process for debridement of necrotic tissue Date Initiated: 04/08/2023 Target Resolution Date: 04/15/2023 Goal Status: Active Interventions: Assess patient pain level pre-, during and post procedure and prior to discharge Provide education on necrotic tissue and debridement process ZAYDIE, IRONS B ([id number]) [id number] Page 6 of 10 Treatment Activities: Apply topical anesthetic as ordered : 04/08/2023 Excisional debridement : 04/08/2023 Notes: Venous Leg Ulcer Nursing Diagnoses: Knowledge deficit related to disease process and management Potential for venous Insuffiency (use before diagnosis confirmed) Goals: Patient will maintain optimal edema control Date Initiated: 04/08/2023 Target Resolution Date: 05/09/2023 Goal Status: Active Patient/caregiver will verbalize understanding of disease process and disease management Date Initiated: 04/08/2023 Target Resolution Date: 05/09/2023 Goal Status: Active Verify adequate tissue perfusion prior to therapeutic compression application Date Initiated: 04/08/2023 Date Inactivated: 04/26/2023 Target Resolution Date: 04/22/2023 Goal Status: Met Interventions: Assess peripheral edema status every visit. Compression as ordered Provide education on venous insufficiency Treatment Activities: Non-invasive vascular studies : 04/08/2023 T ordered outside of clinic : 04/08/2023 est Therapeutic compression applied : 04/08/2023 Notes: Wound/Skin Impairment Nursing Diagnoses: Impaired tissue integrity Knowledge deficit related to ulceration/compromised skin integrity Goals: Patient/caregiver will verbalize understanding of skin care regimen Date Initiated: 04/08/2023 Target Resolution Date: 05/09/2023 Goal Status: Active Ulcer/skin breakdown will have a volume reduction of 30% by week 4 Date Initiated: 04/08/2023 Target Resolution Date: 05/09/2023 Goal Status:  Active Ulcer/skin breakdown will have a volume reduction of 50% by week 8 Date Initiated: 04/08/2023 Target Resolution Date: 06/08/2023 Goal Status: Active Ulcer/skin breakdown will have a volume reduction of 80% by week 12 Date Initiated: 04/08/2023 Target Resolution Date: 07/09/2023 Goal Status: Active Ulcer/skin breakdown will heal within 14 weeks Date Initiated: 04/08/2023 Target Resolution Date: 07/23/2023 Goal Status: Active Interventions: Assess patient/caregiver ability to obtain necessary supplies Assess patient/caregiver ability to perform ulcer/skin care regimen upon admission and as needed Assess ulceration(s) every visit Provide education on ulcer and skin care Treatment Activities: Skin care regimen initiated : 04/08/2023 Notes: Electronic Signature(s) Signed: 04/26/2023 4:09:38 PM By: Midge Aver MSN RN CNS WTA Entered By: Midge Aver on 04/26/2023 10:00:33 Manfred Shirts ([id number]) [id number] Page 7 of 10 -------------------------------------------------------------------------------- Pain Assessment Details Patient Name: Date of Service: Ariana [name] 04/26/2023 12:15 PM Medical Record Number: [id number] Patient Account Number: [id number] Date of Birth/Sex: Treating RN: July 15, 1941 (82 y.o. [name] Primary Care Fitz Matsuo: Bari Edward Other Clinician: Referring Gabriell Casimir: Treating Malacki Mcphearson/Extender: Francee Piccolo in Treatment: 2 Active Problems Location of Pain Severity and Description of Pain Patient Has Paino No Site Locations Pain Management and Medication Current Pain Management: Electronic Signature(s) Signed: 04/26/2023 4:09:38 PM By: Midge Aver MSN RN CNS WTA Entered By: Midge Aver on 04/26/2023 09:23:04 -------------------------------------------------------------------------------- Patient/Caregiver Education Details Patient Name: Date of Service: Ariana CE, HA ZEL  B. 9/10/2024andnbsp12:15  PM Medical Record Number: [id number] Patient Account Number: [id number] Date of Birth/Gender: Treating RN: August 12, 1941 (82 y.o. [name] Primary Care Physician: Bari Edward Other Clinician: Referring Physician: Treating Physician/Extender: Francee Piccolo in Treatment: 2 CAROLENE, POTRYKUS ([id number]) [id number] Page 8 of 10 Education Assessment Education Provided To: Patient Education Topics Provided Wound/Skin Impairment: Handouts: Caring for Your Ulcer Methods: Explain/Verbal Responses: State content correctly Electronic Signature(s) Signed: 04/26/2023 4:09:38 PM By: Midge Aver MSN RN CNS WTA Entered By: Midge Aver on 04/26/2023 10:00:51 -------------------------------------------------------------------------------- Wound Assessment Details Patient Name: Date of Service: Ariana CE, [name] 04/26/2023 12:15 PM Medical Record Number: [id number] Patient Account Number: [id number] Date of Birth/Sex: Treating RN: 1940/09/15 (82 y.o. [name] Primary Care Caitlyn Buchanan: Bari Edward Other Clinician: Referring Guillermina Shaft: Treating Jocelin Schuelke/Extender: Tonye Pearson Weeks in Treatment: 2 Wound Status Wound Number: 1 Primary Etiology: Trauma, Other Wound Location: Left, Medial Lower Leg Wound Status: Open Wounding Event: Trauma Comorbid History: Aspiration, Hypertension, Neuropathy Date Acquired: 12/31/2022 Weeks Of Treatment: 2 Clustered Wound: No Photos Wound Measurements Length: (cm) 0.8 Width: (cm) 0.2 Depth: (cm) 0.1 Area: (cm) 0.126 Volume: (cm) 0.013 % Reduction in Area: 92% % Reduction in Volume: 95.9% Wound Description Classification: Full Thickness Without Exposed Support Exudate Amount: Medium Exudate Type: Serosanguineous Hartlage, Radiance B ([id number]) Exudate Color: red, brown Structures Foul Odor After Cleansing: No Slough/Fibrino Yes [id number] Page 9 of  10 Wound Bed Granulation Amount: Small (1-33%) Exposed Structure Granulation Quality: Red, Pink Fascia Exposed: No Necrotic Amount: Medium (34-66%) Fat Layer (Subcutaneous Tissue) Exposed: Yes Necrotic Quality: Adherent Slough Tendon Exposed: No Muscle Exposed: No Joint Exposed: No Bone Exposed: No Treatment Notes Wound #1 (Lower Leg) Wound Laterality: Left, Medial Cleanser Soap and Water Discharge Instruction: Gently cleanse wound with antibacterial soap, rinse and pat dry prior to dressing wounds Vashe 5.8 (oz) Discharge Instruction: Use vashe 5.8 (oz) as directed Peri-Wound Care Topical Primary Dressing Prisma 4.34 (in) Discharge Instruction: Moisten w/normal saline or sterile water; Cover wound as directed. Do not remove from wound bed. Secondary Dressing ABD Pad 5x9 (in/in) Discharge Instruction: Cover with ABD pad Secured With Compression Wrap Urgo K2 Lite, two layer compression system, regular Compression Stockings Add-Ons Electronic Signature(s) Signed: 04/26/2023 4:09:38 PM By: Midge Aver MSN RN CNS WTA Entered By: Midge Aver on 04/26/2023 09:33:00 -------------------------------------------------------------------------------- Vitals Details Patient Name: Date of Service: Ariana CE, [name] 04/26/2023 12:15 PM Medical Record Number: [id number] Patient Account Number: [id number] Date of Birth/Sex: Treating RN: [birth year] (82 y.o. [name] Primary Care Randolf Sansoucie: Bari Edward Other Clinician: Referring Pranish Akhavan: Treating Tom Ragsdale/Extender: Francee Piccolo in Treatment: 2 Vital Signs Time Taken: 12:21 Temperature (F): 97.8 Height (in): 66 Pulse (bpm): 53 Source: Stated Respiratory Rate (breaths/min): 18 Blood Pressure (mmHg): 174/84 Reference Range: 80 - 120 mg / dl KANARI, AROCHE B ([id number]) [id number] Page 10 of 10 Electronic Signature(s) Signed: 04/26/2023 4:09:38 PM By: Midge Aver MSN RN CNS  WTA Entered By: Midge Aver on 04/26/2023 09:22:54

## 2023-04-27 ENCOUNTER — Other Ambulatory Visit (INDEPENDENT_AMBULATORY_CARE_PROVIDER_SITE_OTHER): Payer: Self-pay | Admitting: Nurse Practitioner

## 2023-04-27 DIAGNOSIS — I739 Peripheral vascular disease, unspecified: Secondary | ICD-10-CM

## 2023-04-28 ENCOUNTER — Ambulatory Visit (INDEPENDENT_AMBULATORY_CARE_PROVIDER_SITE_OTHER): Payer: Medicare Other | Admitting: Internal Medicine

## 2023-04-28 ENCOUNTER — Encounter: Payer: Self-pay | Admitting: Internal Medicine

## 2023-04-28 VITALS — BP 128/72 | HR 43 | Ht 66.0 in | Wt 207.0 lb

## 2023-04-28 DIAGNOSIS — Z23 Encounter for immunization: Secondary | ICD-10-CM

## 2023-04-28 DIAGNOSIS — R7303 Prediabetes: Secondary | ICD-10-CM

## 2023-04-28 DIAGNOSIS — N1831 Chronic kidney disease, stage 3a: Secondary | ICD-10-CM | POA: Diagnosis not present

## 2023-04-28 DIAGNOSIS — J3089 Other allergic rhinitis: Secondary | ICD-10-CM | POA: Diagnosis not present

## 2023-04-28 DIAGNOSIS — I872 Venous insufficiency (chronic) (peripheral): Secondary | ICD-10-CM

## 2023-04-28 DIAGNOSIS — I10 Essential (primary) hypertension: Secondary | ICD-10-CM

## 2023-04-28 NOTE — Assessment & Plan Note (Addendum)
Managed with diet alone.  Last A1C slightly higher. She has changed her diet and has lost a few pounds. Lab Results  Component Value Date   HGBA1C 6.6 (H) 01/06/2023

## 2023-04-28 NOTE — Assessment & Plan Note (Signed)
With recent LLE wound - improving with Wound clinic care; Seeing VS next week for evaluation

## 2023-04-28 NOTE — Progress Notes (Signed)
Date:  04/28/2023   Name:  Ariana Guzman   DOB:  1941-07-22   MRN:  [id number]   Chief Complaint: Diabetes  Diabetes She presents for her follow-up diabetic visit. She has type 2 diabetes mellitus. Hypoglycemia symptoms include nervousness/anxiousness. Pertinent negatives for hypoglycemia include no headaches or tremors. Pertinent negatives for diabetes include no chest pain, no fatigue, no polydipsia and no polyuria. Current diabetic treatment includes diet.  Hypertension This is a chronic problem. The problem is controlled. Pertinent negatives include no chest pain, headaches, palpitations or shortness of breath.  CKD - new as of last labs.  Prior GFRs normal.  Venous insufficiency - has a wound on left leg that is being treated by Wound clinic.  Wrap in place and surgical boot.  She also has VS consult next week.  Lab Results  Component Value Date   NA 129 (L) 01/06/2023   K 5.7 (H) 01/06/2023   CO2 23 01/06/2023   GLUCOSE 114 (H) 01/06/2023   BUN 25 01/06/2023   CREATININE 1.08 (H) 01/06/2023   CALCIUM 9.2 01/06/2023   EGFR 51 (L) 01/06/2023   GFRNONAA 63 08/14/2020   Lab Results  Component Value Date   CHOL 133 01/06/2023   HDL 48 01/06/2023   LDLCALC 71 01/06/2023   TRIG 69 01/06/2023   CHOLHDL 2.8 01/06/2023   Lab Results  Component Value Date   TSH 2.850 01/06/2023   Lab Results  Component Value Date   HGBA1C 6.6 (H) 01/06/2023   Lab Results  Component Value Date   WBC 10.1 01/06/2023   HGB 11.9 01/06/2023   HCT 36.3 01/06/2023   MCV 91 01/06/2023   PLT 270 01/06/2023   Lab Results  Component Value Date   ALT 15 01/06/2023   AST 13 01/06/2023   ALKPHOS 62 01/06/2023   BILITOT 1.3 (H) 01/06/2023   No results found for: "25OHVITD2", "25OHVITD3", "VD25OH"   Review of Systems  Constitutional:  Negative for appetite change, fatigue, fever and unexpected weight change.  HENT:  Negative for tinnitus and trouble swallowing.   Eyes:  Negative for  visual disturbance.  Respiratory:  Negative for cough, chest tightness and shortness of breath.   Cardiovascular:  Negative for chest pain, palpitations and leg swelling.  Gastrointestinal:  Negative for abdominal pain.  Endocrine: Negative for polydipsia and polyuria.  Genitourinary:  Negative for dysuria and hematuria.  Musculoskeletal:  Negative for arthralgias.  Skin:  Positive for wound.  Neurological:  Negative for tremors, numbness and headaches.  Psychiatric/Behavioral:  Negative for dysphoric mood. The patient is nervous/anxious.     Patient Active Problem List   Diagnosis Date Noted   Ganglion cyst of finger of right hand 07/12/2022   Primary osteoarthritis of left knee 06/11/2022   Spinal stenosis of lumbar region without neurogenic claudication 06/11/2022   Venous stasis dermatitis of both lower extremities 02/11/2022   Slow transit constipation 12/25/2020   Bursitis of hip 06/05/2020   Acquired thrombophilia (HCC) 06/05/2020   Hyponatremia 04/05/2020   Environmental and seasonal allergies 04/03/2020   Pre-diabetes 05/29/2018   Primary insomnia 09/15/2017   Generalized anxiety disorder 09/15/2017   Essential hypertension 09/09/2017   Varicose veins of both lower extremities with pain 09/09/2017   Bilateral lower extremity edema 09/09/2017   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/03/2015   Otitis externa, chronic 12/03/2015   Obstructive sleep apnea 10/07/2015   Asthma, persistent controlled 09/25/2015   Acquired hypothyroidism 02/29/2012   Chronic atrial fibrillation (  HCC) 02/29/2012   Mixed hyperlipidemia 02/29/2012    Allergies  Allergen Reactions   Tramadol Shortness Of Breath   Meloxicam Rash    Past Surgical History:  Procedure Laterality Date   ABDOMINAL HYSTERECTOMY  1991   uterus only- still has cervix - done for prolapse   CATARACT EXTRACTION W/PHACO Left 11/24/2020   Procedure: CATARACT EXTRACTION PHACO AND INTRAOCULAR LENS  PLACEMENT (IOC) LEFT 6.99 00:46.1;  Surgeon: Nevada Crane, MD;  Location: [facility];  Service: Ophthalmology;  Laterality: Left;   CATARACT EXTRACTION W/PHACO Right 12/08/2020   Procedure: CATARACT EXTRACTION PHACO AND INTRAOCULAR LENS PLACEMENT (IOC) RIGHT 5.13 00:34.2;  Surgeon: Nevada Crane, MD;  Location: [facility];  Service: Ophthalmology;  Laterality: Right;   COLONOSCOPY  2009   normal   TOE SURGERY      Social History   Tobacco Use   Smoking status: Former    Current packs/day: 0.00    Average packs/day: 0.5 packs/day for 29.0 years (14.5 ttl pk-yrs)    Types: Cigarettes    Start date: 09/28/1960    Quit date: 09/28/1989    Years since quitting: 33.6   Smokeless tobacco: Never   Tobacco comments:    smoking cessation materials not required  Vaping Use   Vaping status: Never Used  Substance Use Topics   Alcohol use: No   Drug use: No     Medication list has been reviewed and updated.  Current Meds  Medication Sig   albuterol (PROVENTIL) (2.5 MG/3ML) 0.083% nebulizer solution Take 2.5 mg by nebulization every 6 (six) hours as needed.   albuterol (VENTOLIN HFA) 108 (90 Base) MCG/ACT inhaler Inhale 2 puffs into the lungs every 4 (four) hours as needed.   ALPRAZolam (XANAX) 0.25 MG tablet TAKE 1 TABLET BY MOUTH AT BEDTIME   Ascorbic Acid (VITAMIN C) 1000 MG tablet Take 1,000 mg by mouth daily.   baclofen (LIORESAL) 10 MG tablet Take 1 tablet (10 mg total) by mouth 3 (three) times daily.   Calcium Carb-Cholecalciferol [phone] MG-UNIT TABS Take by mouth.   cetirizine (ZYRTEC) 10 MG tablet Take 10 mg by mouth 2 (two) times daily.   cholecalciferol (VITAMIN D) 1000 units tablet Take 1,000 Units by mouth daily.   clotrimazole-betamethasone (LOTRISONE) cream Apply 1 application topically 2 (two) times daily. To rash under breast   Fluticasone-Umeclidin-Vilant (TRELEGY ELLIPTA) 200-62.5-25 MCG/ACT AEPB Inhale into the lungs.   ipratropium  (ATROVENT) 0.06 % nasal spray Place 2 sprays into both nostrils 4 (four) times daily.   latanoprost (XALATAN) 0.005 % ophthalmic solution 1 drop at bedtime.   levothyroxine (SYNTHROID) 75 MCG tablet TAKE 1 TABLET BY MOUTH ONCE DAILY BEFORE BREAKFAST   losartan (COZAAR) 100 MG tablet Take 1 tablet (100 mg total) by mouth daily.   Magnesium 100 MG CAPS Take by mouth.   MELATONIN PO Take by mouth.   metoprolol tartrate (LOPRESSOR) 25 MG tablet TAKE 1 TABLET BY MOUTH IN THE MORNING AND AT NOON AND AT BEDTIME - TAKE 1 TABLET THREE TIMES DAILY   montelukast (SINGULAIR) 10 MG tablet Take 10 mg by mouth daily.   Multiple Vitamins-Minerals (OCUVITE EYE HEATLH GUMMIES PO) Take by mouth daily.   Omega-3 Fatty Acids (FISH OIL PO) Take by mouth.   Rivaroxaban (XARELTO) 15 MG TABS tablet Take 15 mg by mouth daily with supper.    silver sulfADIAZINE (SILVADENE) 1 % cream Apply 1 Application topically daily.   spironolactone (ALDACTONE) 25 MG tablet Take 1 tablet  by mouth twice daily       04/28/2023    9:51 AM 02/14/2023    1:44 PM 01/06/2023   10:42 AM 07/12/2022    3:40 PM  GAD 7 : Generalized Anxiety Score  Nervous, Anxious, on Edge 1  0 2  Control/stop worrying 0 0 0 2  Worry too much - different things 0 0 0 2  Trouble relaxing 0 0 0 0  Restless 0 0 0 0  Easily annoyed or irritable 0 0 0 0  Afraid - awful might happen 0 0 0 0  Total GAD 7 Score 1  0 6  Anxiety Difficulty Not difficult at all Not difficult at all Not difficult at all Not difficult at all       04/28/2023    9:51 AM 02/14/2023    1:44 PM 01/06/2023   10:42 AM  Depression screen PHQ 2/9  Decreased Interest 0 0 0  Down, Depressed, Hopeless 0 0 0  PHQ - 2 Score 0 0 0  Altered sleeping 1 0 0  Tired, decreased energy 1 0 2  Change in appetite 0 0 0  Feeling bad or failure about yourself  0 0 0  Trouble concentrating 0 0 0  Moving slowly or fidgety/restless 0 0 0  Suicidal thoughts 0 0 0  PHQ-9 Score 2 0 2  Difficult doing  work/chores Not difficult at all Not difficult at all Not difficult at all    BP Readings from Last 3 Encounters:  04/28/23 128/72  02/14/23 128/68  01/06/23 118/78    Physical Exam Vitals and nursing note reviewed.  Constitutional:      General: She is not in acute distress.    Appearance: She is well-developed.  HENT:     Head: Normocephalic and atraumatic.  Neck:     Vascular: No carotid bruit.  Cardiovascular:     Rate and Rhythm: Normal rate and regular rhythm.  Pulmonary:     Effort: Pulmonary effort is normal. No respiratory distress.     Breath sounds: No wheezing or rhonchi.  Musculoskeletal:        General: No swelling.     Cervical back: Normal range of motion.  Lymphadenopathy:     Cervical: No cervical adenopathy.  Skin:    General: Skin is warm and dry.     Findings: No rash.     Comments: LLE wrapped - wound not examined   Neurological:     Mental Status: She is alert and oriented to person, place, and time.  Psychiatric:        Mood and Affect: Mood normal.        Behavior: Behavior normal.     Wt Readings from Last 3 Encounters:  04/28/23 207 lb (93.9 kg)  02/14/23 210 lb (95.3 kg)  01/06/23 215 lb (97.5 kg)    BP 128/72   Pulse (!) 43   Ht 5\' 6"  (1.676 m)   Wt 207 lb (93.9 kg)   SpO2 98%   BMI 33.41 kg/m   Assessment and Plan:  Problem List Items Addressed This Visit       Unprioritized   Venous stasis dermatitis of both lower extremities (Chronic)    With recent LLE wound - improving with Wound clinic care; Seeing VS next week for evaluation      Pre-diabetes - Primary (Chronic)    Managed with diet alone.  Last A1C slightly higher. She has changed her diet and has lost a  few pounds. Lab Results  Component Value Date   HGBA1C 6.6 (H) 01/06/2023         Relevant Orders   Basic metabolic panel   Hemoglobin A1c   Microalbumin / creatinine urine ratio   Essential hypertension (Chronic)    Normal exam with stable BP. No  concerns or side effects to current medication. No change in regimen; continue low sodium diet.      Environmental and seasonal allergies    Currently on Singulair and Zyrtec Not taking Flonase regularly - would resume daily use for better symptom control.      Other Visit Diagnoses     Need for vaccination for pneumococcus       Relevant Orders   Pneumococcal conjugate vaccine 20-valent (Completed)   Stage 3a chronic kidney disease (HCC)       noted on last labs she is not taking any NSAIDS and is drinking about 64 oz fluid daily will recheck labs and advise   Relevant Orders   Basic metabolic panel   Need for influenza vaccination       Relevant Orders   Flu Vaccine Trivalent High Dose (Fluad) (Completed)       Return in about 4 months (around 08/28/2023) for HTN, prediabetes.    Reubin Milan, MD [facility] [place]

## 2023-04-28 NOTE — Assessment & Plan Note (Signed)
Normal exam with stable BP. No concerns or side effects to current medication. No change in regimen; continue low sodium diet.

## 2023-04-28 NOTE — Assessment & Plan Note (Addendum)
Currently on Singulair and Zyrtec Not taking Flonase regularly - would resume daily use for better symptom control.

## 2023-04-29 LAB — BASIC METABOLIC PANEL
BUN/Creatinine Ratio: 24 (ref 12–28)
BUN: 23 mg/dL (ref 8–27)
CO2: 22 mmol/L (ref 20–29)
Calcium: 9.8 mg/dL (ref 8.7–10.3)
Chloride: 96 mmol/L (ref 96–106)
Creatinine, Ser: 0.96 mg/dL (ref 0.57–1.00)
Glucose: 108 mg/dL — ABNORMAL HIGH (ref 70–99)
Potassium: 4.8 mmol/L (ref 3.5–5.2)
Sodium: 135 mmol/L (ref 134–144)
eGFR: 59 mL/min/{1.73_m2} — ABNORMAL LOW (ref >59)

## 2023-04-29 LAB — HEMOGLOBIN A1C
Est. average glucose Bld gHb Est-mCnc: 146 mg/dL
Hgb A1c MFr Bld: 6.7 % — ABNORMAL HIGH (ref 4.8–5.6)

## 2023-04-29 LAB — MICROALBUMIN / CREATININE URINE RATIO
Creatinine, Urine: 51.6 mg/dL
Microalb/Creat Ratio: 20 mg/g{creat} (ref 0–29)
Microalbumin, Urine: 10.3 ug/mL

## 2023-05-01 NOTE — Progress Notes (Unsigned)
MRN : [id number]  Ariana Guzman is a 82 y.o. ([birth year]) female who presents with chief complaint of legs hurt and swell.  History of Present Illness:  The patient returns to the office for followup and review of the noninvasive studies.   There have been no interval changes in lower extremity symptoms. No interval shortening of the patient's claudication distance or development of rest pain symptoms. No new ulcers or wounds have occurred since the last visit and the venous wound on the left shin.  She has been treating now appears to be healed.  There have been no significant changes to the patient's overall health care.  The patient denies amaurosis fugax or recent TIA symptoms. There are no documented recent neurological changes noted. There is no history of DVT, PE or superficial thrombophlebitis. The patient denies recent episodes of angina or shortness of breath.   ABI Rt=1.07 and Lt=0.97 with triphasic waveforms bilaterally (previous ABI's Rt=1.09 and Lt=1.17)   No outpatient medications have been marked as taking for the 05/02/23 encounter (Appointment) with Gilda Crease, Latina Craver, MD.    Past Medical History:  Diagnosis Date   Allergies    Arthritis    Asthma    Atrial fibrillation (HCC)    Hypertension    Peripheral arterial disease (HCC)    Sleep apnea    CPAP    Past Surgical History:  Procedure Laterality Date   ABDOMINAL HYSTERECTOMY  1991   uterus only- still has cervix - done for prolapse   CATARACT EXTRACTION W/PHACO Left 11/24/2020   Procedure: CATARACT EXTRACTION PHACO AND INTRAOCULAR LENS PLACEMENT (IOC) LEFT 6.99 00:46.1;  Surgeon: Nevada Crane, MD;  Location: [facility];  Service: Ophthalmology;  Laterality: Left;   CATARACT EXTRACTION W/PHACO Right 12/08/2020   Procedure: CATARACT EXTRACTION PHACO AND INTRAOCULAR LENS PLACEMENT (IOC) RIGHT 5.13 00:34.2;  Surgeon: Nevada Crane, MD;  Location: [facility];  Service:  Ophthalmology;  Laterality: Right;   COLONOSCOPY  2009   normal   TOE SURGERY      Social History Social History   Tobacco Use   Smoking status: Former    Current packs/day: 0.00    Average packs/day: 0.5 packs/day for 29.0 years (14.5 ttl pk-yrs)    Types: Cigarettes    Start date: 09/28/1960    Quit date: 09/28/1989    Years since quitting: 33.6   Smokeless tobacco: Never   Tobacco comments:    smoking cessation materials not required  Vaping Use   Vaping status: Never Used  Substance Use Topics   Alcohol use: No   Drug use: No    Family History Family History  Problem Relation Age of Onset   Breast cancer Other 71   Dementia Mother    Cancer Father        prostate and bone   Heart attack Sister     Allergies  Allergen Reactions   Tramadol Shortness Of Breath   Meloxicam Rash     REVIEW OF SYSTEMS (Negative unless checked)  Constitutional: [] Weight loss  [] Fever  [] Chills Cardiac: [] Chest pain   [] Chest pressure   [] Palpitations   [] Shortness of breath when laying flat   [] Shortness of breath with exertion. Vascular:  [] Pain in legs with walking   [x] Pain in legs at rest  [] History of DVT   [] Phlebitis   [x] Swelling in legs   [] Varicose veins   [] Non-healing ulcers Pulmonary:   [] Uses home oxygen   []   Productive cough   [] Hemoptysis   [] Wheeze  [] COPD   [] Asthma Neurologic:  [] Dizziness   [] Seizures   [] History of stroke   [] History of TIA  [] Aphasia   [] Vissual changes   [] Weakness or numbness in arm   [] Weakness or numbness in leg Musculoskeletal:   [] Joint swelling   [] Joint pain   [] Low back pain Hematologic:  [] Easy bruising  [] Easy bleeding   [] Hypercoagulable state   [] Anemic Gastrointestinal:  [] Diarrhea   [] Vomiting  [] Gastroesophageal reflux/heartburn   [] Difficulty swallowing. Genitourinary:  [] Chronic kidney disease   [] Difficult urination  [] Frequent urination   [] Blood in urine Skin:  [] Rashes   [] Ulcers  Psychological:  [] History of anxiety   []   History of major depression.  Physical Examination  There were no vitals filed for this visit. There is no height or weight on file to calculate BMI. Gen: WD/WN, NAD Head: [place]/AT, No temporalis wasting.  Ear/Nose/Throat: Hearing grossly intact, nares w/o erythema or drainage, pinna without lesions Eyes: PER, EOMI, sclera nonicteric.  Neck: Supple, no gross masses.  No JVD.  Pulmonary:  Good air movement, no audible wheezing, no use of accessory muscles.  Cardiac: RRR, precordium not hyperdynamic. Vascular:  scattered varicosities present bilaterally.  Moderate venous stasis changes to the legs bilaterally.  1+ soft pitting edema. CEAP [id number]   Vessel Right Left  Radial Palpable Palpable  Gastrointestinal: soft, non-distended. No guarding/no peritoneal signs.  Musculoskeletal: M/S 5/5 throughout.  No deformity.  Neurologic: CN 2-12 intact. Pain and light touch intact in extremities.  Symmetrical.  Speech is fluent. Motor exam as listed above. Psychiatric: Judgment intact, Mood & affect appropriate for pt's clinical situation. Dermatologic: Venous rashes no ulcers noted.  No changes consistent with cellulitis. Lymph : No lichenification or skin changes of chronic lymphedema.  CBC Lab Results  Component Value Date   WBC 10.1 01/06/2023   HGB 11.9 01/06/2023   HCT 36.3 01/06/2023   MCV 91 01/06/2023   PLT 270 01/06/2023    BMET    Component Value Date/Time   NA 135 04/28/2023 1045   K 4.8 04/28/2023 1045   CL 96 04/28/2023 1045   CO2 22 04/28/2023 1045   GLUCOSE 108 (H) 04/28/2023 1045   GLUCOSE 106 (H) 06/05/2020 1159   BUN 23 04/28/2023 1045   CREATININE 0.96 04/28/2023 1045   CALCIUM 9.8 04/28/2023 1045   GFRNONAA 63 08/14/2020 1147   GFRNONAA >60 06/05/2020 1159   GFRAA 72 08/14/2020 1147   Estimated Creatinine Clearance: 51.2 mL/min (by C-G formula based on SCr of 0.96 mg/dL).  COAG No results found for: "INR", "PROTIME"  Radiology No results  found.   Assessment/Plan 1. Venous stasis dermatitis of both lower extremities Recommend:  No surgery or intervention at this point in time.  ABIs obtained today appear normal bilaterally.  I have reviewed my discussion with the patient regarding venous insufficiency and why it causes symptoms. I have discussed with the patient the chronic skin changes that accompany venous insufficiency and the long term sequela such as ulceration. Patient will continue wearing graduated compression stockings on a daily basis, as this has provided excellent control of his edema. The patient will put the stockings on first thing in the morning and removing them in the evening. The patient is reminded not to sleep in the stockings.  In addition, behavioral modification including elevation during the day will be initiated. Exercise is strongly encouraged.  Previous duplex ultrasound of the lower extremities shows normal deep system,  no significant superficial reflux was identified.  Given the patient's good control and lack of any problems regarding the venous insufficiency and lymphedema a lymph pump in not need at this time.    The patient will follow up with me PRN should anything change.  The patient voices agreement with this plan.  2. Varicose veins of both lower extremities with pain Recommend:  No surgery or intervention at this point in time.  I have reviewed my discussion with the patient regarding venous insufficiency and why it causes symptoms. I have discussed with the patient the chronic skin changes that accompany venous insufficiency and the long term sequela such as ulceration. Patient will continue wearing graduated compression stockings on a daily basis, as this has provided excellent control of his edema. The patient will put the stockings on first thing in the morning and removing them in the evening. The patient is reminded not to sleep in the stockings.  In addition, behavioral  modification including elevation during the day will be initiated. Exercise is strongly encouraged.  Previous duplex ultrasound of the lower extremities shows normal deep system, no significant superficial reflux was identified.  Given the patient's good control and lack of any problems regarding the venous insufficiency and lymphedema a lymph pump in not need at this time.    The patient will follow up with me PRN should anything change.  The patient voices agreement with this plan.  3. Chronic atrial fibrillation (HCC) Continue antiarrhythmia medications as already ordered, these medications have been reviewed and there are no changes at this time.  Continue anticoagulation as ordered by Cardiology Service  4. Essential hypertension Continue antihypertensive medications as already ordered, these medications have been reviewed and there are no changes at this time.  5. Asthma, persistent controlled Continue pulmonary medications and aerosols as already ordered, these medications have been reviewed and there are no changes at this time.     Levora Dredge, MD  05/01/2023 3:15 PM

## 2023-05-02 ENCOUNTER — Ambulatory Visit (INDEPENDENT_AMBULATORY_CARE_PROVIDER_SITE_OTHER): Payer: Medicare Other | Admitting: Vascular Surgery

## 2023-05-02 ENCOUNTER — Encounter (INDEPENDENT_AMBULATORY_CARE_PROVIDER_SITE_OTHER): Payer: Self-pay | Admitting: Vascular Surgery

## 2023-05-02 ENCOUNTER — Ambulatory Visit (INDEPENDENT_AMBULATORY_CARE_PROVIDER_SITE_OTHER): Payer: Medicare Other

## 2023-05-02 VITALS — BP 170/81 | HR 83 | Resp 18 | Ht 66.0 in | Wt 207.6 lb

## 2023-05-02 DIAGNOSIS — I482 Chronic atrial fibrillation, unspecified: Secondary | ICD-10-CM | POA: Diagnosis not present

## 2023-05-02 DIAGNOSIS — I10 Essential (primary) hypertension: Secondary | ICD-10-CM

## 2023-05-02 DIAGNOSIS — I739 Peripheral vascular disease, unspecified: Secondary | ICD-10-CM

## 2023-05-02 DIAGNOSIS — I83813 Varicose veins of bilateral lower extremities with pain: Secondary | ICD-10-CM | POA: Diagnosis not present

## 2023-05-02 DIAGNOSIS — I872 Venous insufficiency (chronic) (peripheral): Secondary | ICD-10-CM | POA: Diagnosis not present

## 2023-05-02 DIAGNOSIS — J45998 Other asthma: Secondary | ICD-10-CM

## 2023-05-02 LAB — VAS US ABI WITH/WO TBI
Left ABI: 0.97
Right ABI: 1.07

## 2023-05-02 NOTE — Progress Notes (Signed)
Pt saw results online

## 2023-05-05 ENCOUNTER — Ambulatory Visit: Payer: Medicare Other | Admitting: Physician Assistant

## 2023-05-06 ENCOUNTER — Encounter: Payer: Medicare Other | Admitting: Physician Assistant

## 2023-05-06 DIAGNOSIS — I480 Paroxysmal atrial fibrillation: Secondary | ICD-10-CM | POA: Diagnosis not present

## 2023-05-06 DIAGNOSIS — S81802A Unspecified open wound, left lower leg, initial encounter: Secondary | ICD-10-CM | POA: Diagnosis not present

## 2023-05-06 DIAGNOSIS — I10 Essential (primary) hypertension: Secondary | ICD-10-CM | POA: Diagnosis not present

## 2023-05-06 DIAGNOSIS — Z7901 Long term (current) use of anticoagulants: Secondary | ICD-10-CM | POA: Diagnosis not present

## 2023-05-06 DIAGNOSIS — I7389 Other specified peripheral vascular diseases: Secondary | ICD-10-CM | POA: Diagnosis not present

## 2023-05-06 DIAGNOSIS — D6869 Other thrombophilia: Secondary | ICD-10-CM | POA: Diagnosis not present

## 2023-05-06 DIAGNOSIS — L97822 Non-pressure chronic ulcer of other part of left lower leg with fat layer exposed: Secondary | ICD-10-CM | POA: Diagnosis not present

## 2023-05-06 DIAGNOSIS — I87332 Chronic venous hypertension (idiopathic) with ulcer and inflammation of left lower extremity: Secondary | ICD-10-CM | POA: Diagnosis not present

## 2023-05-06 DIAGNOSIS — I872 Venous insufficiency (chronic) (peripheral): Secondary | ICD-10-CM | POA: Diagnosis not present

## 2023-05-06 DIAGNOSIS — S51811A Laceration without foreign body of right forearm, initial encounter: Secondary | ICD-10-CM | POA: Diagnosis not present

## 2023-05-06 NOTE — Progress Notes (Signed)
ADOREE, LUBER ([id number]) [id number] Page 1 of 6 Visit Report for 05/06/2023 Chief Complaint Document Details Patient Name: Date of Service: Ariana Guzman 05/06/2023 10:30 A M Medical Record Number: [id number] Patient Account Number: [id number] Date of Birth/Sex: Treating RN: 11-02-1940 (82 y.o. Ariana Guzman Primary Care Provider: Bari Edward Other Clinician: Betha Loa Referring Provider: Treating Provider/Extender: Francee Piccolo in Treatment: 4 Information Obtained from: Patient Chief Complaint Left leg and right forearm ulcers Electronic Signature(s) Signed: 05/06/2023 10:50:45 AM By: Allen Derry PA-C Entered By: Allen Derry on 05/06/2023 07:50:45 -------------------------------------------------------------------------------- HPI Details Patient Name: Date of Service: Ariana CE, Ariana Guzman. 05/06/2023 10:30 A M Medical Record Number: [id number] Patient Account Number: [id number] Date of Birth/Sex: Treating RN: 1940-09-02 (82 y.o. Ariana Guzman Primary Care Provider: Bari Edward Other Clinician: Betha Loa Referring Provider: Treating Provider/Extender: Francee Piccolo in Treatment: 4 History of Present Illness Chronic/Inactive Conditions Condition 1: 04-08-2023 patient's ABI on screening in the office was 0.66 although she does have a history of having normal ABIs in 2019. I would like to send her back for formal arterial testing. HPI Description: 04-08-2023 patient presents for initial inspection here in clinic concerning 2 issues 1 is a skin tear on her right forearm and the other is a wound on her left lower extremity. Subsequently she tells me that the skin tear actually occurred around March 31, 2023 only about a week ago and seems to be doing pretty well. With regard to the leg she tells me that she actually dropped something on her leg which caused a skin tear and this just has not  healed she is not exactly sure why. She generally does wear compression socks although she has not been able to do it because of the fact that she has the wound. She has significant varicose veins which have been attempted to be treated previous but not completely resolved. With that being said all in all I think that she is doing well I believe it could be that the reason she is not healing with regard to her leg is simply that she has not been able to achieve good compression not being able to use a compression sock. With that being said her ABI was measuring at 0.66 which I think could also be an concern. I would like to have her formally tested as her last formal arterial study was actually in 2019. Patient does have a history of chronic venous insufficiency, peripheral vascular disease, hypertension, thrombophilia, atrial fibrillation, and long-term use of anticoagulants 8/30; this is a patient with a wound on her left medial lower leg. We have been using Prisma under Urgo K2 lite's. We are having problems with the wrap slipping down as the edema in her leg goes down. She is also been referred for arterial studies since her ABIs in our clinic were 0.61. She also has clear signs in her Ariana Guzman, Ariana Guzman ([id number]) [id number] Page 2 of 6 legs of venous hypertension. Her wound is actually quite a bit smaller and looks better today. 04-26-2023 upon evaluation today patient appears to be doing well currently in regard to her wound which is actually showing signs of excellent improvement. Fortunately I do not see any evidence of worsening overall and I believe that the patient is making good headway in fact the wound looks to be significantly smaller even compared to last week when I saw her. 05-06-2023 upon evaluation today patient appears to be doing  ADOREE, LUBER ([id number]) [id number] Page 1 of 6 Visit Report for 05/06/2023 Chief Complaint Document Details Patient Name: Date of Service: Ariana Guzman 05/06/2023 10:30 A M Medical Record Number: [id number] Patient Account Number: [id number] Date of Birth/Sex: Treating RN: 11-02-1940 (82 y.o. Ariana Guzman Primary Care Provider: Bari Edward Other Clinician: Betha Loa Referring Provider: Treating Provider/Extender: Francee Piccolo in Treatment: 4 Information Obtained from: Patient Chief Complaint Left leg and right forearm ulcers Electronic Signature(s) Signed: 05/06/2023 10:50:45 AM By: Allen Derry PA-C Entered By: Allen Derry on 05/06/2023 07:50:45 -------------------------------------------------------------------------------- HPI Details Patient Name: Date of Service: Ariana CE, Ariana Guzman. 05/06/2023 10:30 A M Medical Record Number: [id number] Patient Account Number: [id number] Date of Birth/Sex: Treating RN: 1940-09-02 (82 y.o. Ariana Guzman Primary Care Provider: Bari Edward Other Clinician: Betha Loa Referring Provider: Treating Provider/Extender: Francee Piccolo in Treatment: 4 History of Present Illness Chronic/Inactive Conditions Condition 1: 04-08-2023 patient's ABI on screening in the office was 0.66 although she does have a history of having normal ABIs in 2019. I would like to send her back for formal arterial testing. HPI Description: 04-08-2023 patient presents for initial inspection here in clinic concerning 2 issues 1 is a skin tear on her right forearm and the other is a wound on her left lower extremity. Subsequently she tells me that the skin tear actually occurred around March 31, 2023 only about a week ago and seems to be doing pretty well. With regard to the leg she tells me that she actually dropped something on her leg which caused a skin tear and this just has not  healed she is not exactly sure why. She generally does wear compression socks although she has not been able to do it because of the fact that she has the wound. She has significant varicose veins which have been attempted to be treated previous but not completely resolved. With that being said all in all I think that she is doing well I believe it could be that the reason she is not healing with regard to her leg is simply that she has not been able to achieve good compression not being able to use a compression sock. With that being said her ABI was measuring at 0.66 which I think could also be an concern. I would like to have her formally tested as her last formal arterial study was actually in 2019. Patient does have a history of chronic venous insufficiency, peripheral vascular disease, hypertension, thrombophilia, atrial fibrillation, and long-term use of anticoagulants 8/30; this is a patient with a wound on her left medial lower leg. We have been using Prisma under Urgo K2 lite's. We are having problems with the wrap slipping down as the edema in her leg goes down. She is also been referred for arterial studies since her ABIs in our clinic were 0.61. She also has clear signs in her Ariana Guzman, Ariana Guzman ([id number]) [id number] Page 2 of 6 legs of venous hypertension. Her wound is actually quite a bit smaller and looks better today. 04-26-2023 upon evaluation today patient appears to be doing well currently in regard to her wound which is actually showing signs of excellent improvement. Fortunately I do not see any evidence of worsening overall and I believe that the patient is making good headway in fact the wound looks to be significantly smaller even compared to last week when I saw her. 05-06-2023 upon evaluation today patient appears to be doing  Pressure: 164/83 mmHg. Respiratory normal breathing without difficulty. Psychiatric this patient is able to make decisions and demonstrates good insight into disease process. Alert and Oriented x 3. pleasant and cooperative. General Notes: Upon inspection patient's wound bed actually showed signs of good granulation epithelization at this point. Fortunately I do not see any signs of worsening overall and I do believe that the patient is making headway towards closure which is good news. No fevers, chills, nausea, vomiting, or diarrhea. Integumentary (Hair, Skin) Wound #1 status is Healed - Epithelialized. Original cause of wound was Trauma. The date acquired was: 12/31/2022. The wound has been in treatment 4 weeks. The wound is located on the Left,Medial Lower Leg. The wound measures 0cm length x 0cm width x 0cm depth; 0cm^2 area and 0cm^3 volume. There is Fat Layer (Subcutaneous Tissue) exposed. There is a none present amount of drainage noted. There is no granulation within the wound bed. There is no necrotic tissue within the wound bed. 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 Put garments on first thing when you wake up and remove them before bed. Additional Orders / Instructions: Other: - Cover area with ABD pad for protection and wear Tubi grip given in office for the next week, then wear your own compression stockings 1. I do think that the patient appears to be completely healed based on what I am seeing this is new skin but I believe that she would likely benefit from continuing with her compression at home. She just had a protective dressing over this. 2. I am going to recommend as well the patient should continue to elevate her legs much as possible to help with edema control. Option more that she can do the better. 3. I am also going to recommend that she wear the Tubigrip for 1 week and then after a week with the Tubigrip and ABD pad she can then go back to her thigh- high compression stockings. We will see patient back for reevaluation in 1 week here in the clinic. If anything worsens or changes patient will contact our office for additional recommendations. Electronic Signature(s) Signed: 05/06/2023 11:05:10 AM By: Volney Presser, Grandville Silos ([id number]) [id number] Page 6 of 6 Signed: 05/06/2023 11:05:10 AM By: Allen Derry PA-C Entered By: Allen Derry on 05/06/2023 08:05:10 -------------------------------------------------------------------------------- SuperBill Details Patient Name: Date of Service: Ariana CE, Ariana Guzman. 05/06/2023 Medical Record Number: [id number] Patient Account Number: [id number] Date of Birth/Sex: Treating RN: 23-Jul-1941 (83 y.o. Ariana Guzman Primary Care Provider: Bari Edward Other Clinician: Betha Loa Referring Provider: Treating Provider/Extender: Francee Piccolo in Treatment: 4 Diagnosis Coding ICD-10 Codes Code Description [phone] Laceration without foreign body of right forearm, initial encounter I87.332 Chronic  venous hypertension (idiopathic) with ulcer and inflammation of left lower extremity L97.822 Non-pressure chronic ulcer of other part of left lower leg with fat layer exposed I73.89 Other specified peripheral vascular diseases I10 Essential (primary) hypertension D68.69 Other thrombophilia I48.0 Paroxysmal atrial fibrillation Z79.01 Long term (current) use of anticoagulants Facility Procedures : CPT4 Code: [id number] Description: 46962 - WOUND CARE VISIT-LEV 2 EST PT  well currently in regard to her wound which actually showed signs of complete resolution. Fortunately there does not appear  to be any evidence of active infection locally or systemically which is great news. No fevers, chills, nausea, vomiting, or diarrhea. Electronic Signature(s) Signed: 05/06/2023 11:04:06 AM By: Allen Derry PA-C Entered By: Allen Derry on 05/06/2023 08:04:06 -------------------------------------------------------------------------------- Physical Exam Details Patient Name: Date of Service: Ariana CE, Ariana Guzman. 05/06/2023 10:30 A M Medical Record Number: [id number] Patient Account Number: [id number] Date of Birth/Sex: Treating RN: 1941/04/13 (82 y.o. Ariana Guzman Primary Care Provider: Bari Edward Other Clinician: Betha Loa Referring Provider: Treating Provider/Extender: Francee Piccolo in Treatment: 4 Constitutional Well-nourished and well-hydrated in no acute distress. Respiratory normal breathing without difficulty. Psychiatric this patient is able to make decisions and demonstrates good insight into disease process. Alert and Oriented x 3. pleasant and cooperative. Notes Upon inspection patient's wound bed actually showed signs of good granulation epithelization at this point. Fortunately I do not see any signs of worsening overall and I do believe that the patient is making headway towards closure which is good news. No fevers, chills, nausea, vomiting, or diarrhea. Electronic Signature(s) Signed: 05/06/2023 11:04:41 AM By: Allen Derry PA-C Entered By: Allen Derry on 05/06/2023 08:04:41 -------------------------------------------------------------------------------- Physician Orders Details Patient Name: Date of Service: Ariana CE, Ariana Guzman. 05/06/2023 10:30 A M Medical Record Number: [id number] Patient Account Number: [id number] Date of Birth/Sex: Treating RN: 12-12-1940 (82 y.o. Ariana Guzman Primary Care Provider: Bari Edward Other Clinician: Bridgetta, Ariana Guzman ([id number]) [id number] Page 3 of  6 Referring Provider: Treating Provider/Extender: Francee Piccolo in Treatment: 4 Verbal / Phone Orders: Yes Clinician: Angelina Guzman Read Back and Verified: Yes Diagnosis Coding ICD-10 Coding Code Description S51.811A Laceration without foreign body of right forearm, initial encounter I87.332 Chronic venous hypertension (idiopathic) with ulcer and inflammation of left lower extremity L97.822 Non-pressure chronic ulcer of other part of left lower leg with fat layer exposed I73.89 Other specified peripheral vascular diseases I10 Essential (primary) hypertension D68.69 Other thrombophilia I48.0 Paroxysmal atrial fibrillation Z79.01 Long term (current) use of anticoagulants Discharge From [facility] Discharge from Wound Care Center Treatment Complete Wear compression garments daily. Put garments on first thing when you wake up and remove them before bed. Additional Orders / Instructions Other: - Cover area with ABD pad for protection and wear Tubi grip given in office for the next week, then wear your own compression stockings Electronic Signature(s) Unsigned Entered By: Betha Loa on 05/06/2023 07:57:49 -------------------------------------------------------------------------------- Problem List Details Patient Name: Date of Service: Ariana CE, [name] Guzman. 05/06/2023 10:30 A M Medical Record Number: [id number] Patient Account Number: [id number] Date of Birth/Sex: Treating RN: 04-25-1941 (82 y.o. 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 Fortunately there does not appear  to be any evidence of active infection locally or systemically which is great news. No fevers, chills, nausea, vomiting, or diarrhea. Electronic Signature(s) Signed: 05/06/2023 11:04:06 AM By: Allen Derry PA-C Entered By: Allen Derry on 05/06/2023 08:04:06 -------------------------------------------------------------------------------- Physical Exam Details Patient Name: Date of Service: Ariana CE, Ariana Guzman. 05/06/2023 10:30 A M Medical Record Number: [id number] Patient Account Number: [id number] Date of Birth/Sex: Treating RN: 1941/04/13 (82 y.o. Ariana Guzman Primary Care Provider: Bari Edward Other Clinician: Betha Loa Referring Provider: Treating Provider/Extender: Francee Piccolo in Treatment: 4 Constitutional Well-nourished and well-hydrated in no acute distress. Respiratory normal breathing without difficulty. Psychiatric this patient is able to make decisions and demonstrates good insight into disease process. Alert and Oriented x 3. pleasant and cooperative. Notes Upon inspection patient's wound bed actually showed signs of good granulation epithelization at this point. Fortunately I do not see any signs of worsening overall and I do believe that the patient is making headway towards closure which is good news. No fevers, chills, nausea, vomiting, or diarrhea. Electronic Signature(s) Signed: 05/06/2023 11:04:41 AM By: Allen Derry PA-C Entered By: Allen Derry on 05/06/2023 08:04:41 -------------------------------------------------------------------------------- Physician Orders Details Patient Name: Date of Service: Ariana CE, Ariana Guzman. 05/06/2023 10:30 A M Medical Record Number: [id number] Patient Account Number: [id number] Date of Birth/Sex: Treating RN: 12-12-1940 (82 y.o. Ariana Guzman Primary Care Provider: Bari Edward Other Clinician: Bridgetta, Ariana Guzman ([id number]) [id number] Page 3 of  6 Referring Provider: Treating Provider/Extender: Francee Piccolo in Treatment: 4 Verbal / Phone Orders: Yes Clinician: Angelina Guzman Read Back and Verified: Yes Diagnosis Coding ICD-10 Coding Code Description S51.811A Laceration without foreign body of right forearm, initial encounter I87.332 Chronic venous hypertension (idiopathic) with ulcer and inflammation of left lower extremity L97.822 Non-pressure chronic ulcer of other part of left lower leg with fat layer exposed I73.89 Other specified peripheral vascular diseases I10 Essential (primary) hypertension D68.69 Other thrombophilia I48.0 Paroxysmal atrial fibrillation Z79.01 Long term (current) use of anticoagulants Discharge From [facility] Discharge from Wound Care Center Treatment Complete Wear compression garments daily. Put garments on first thing when you wake up and remove them before bed. Additional Orders / Instructions Other: - Cover area with ABD pad for protection and wear Tubi grip given in office for the next week, then wear your own compression stockings Electronic Signature(s) Unsigned Entered By: Betha Loa on 05/06/2023 07:57:49 -------------------------------------------------------------------------------- Problem List Details Patient Name: Date of Service: Ariana CE, [name] Guzman. 05/06/2023 10:30 A M Medical Record Number: [id number] Patient Account Number: [id number] Date of Birth/Sex: Treating RN: 04-25-1941 (82 y.o. 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

## 2023-05-09 NOTE — Progress Notes (Signed)
assistance / Altered mentation []  - 0 Support Surface(s) Assessment (bed, cushion, seat, etc.) INTERVENTIONS - Wound Cleansing / Measurement X - Simple Wound Cleansing - one wound 1 5 []  - 0 Complex Wound Cleansing - multiple wounds X- 1 5 Wound Imaging (photographs - any number of wounds) []  - 0 Wound Tracing (instead of photographs) []  - 0 Simple Wound Measurement - one wound []  - 0 Complex Wound Measurement - multiple wounds INTERVENTIONS - Wound Dressings X - Small Wound Dressing one or multiple wounds 1 10 []  - 0 Medium Wound Dressing one or multiple wounds []  - 0 Large Wound Dressing one or multiple wounds []  - 0 Application of Medications - topical []  - 0 Application of Medications - injection INTERVENTIONS - Miscellaneous Ariana Guzman, Ariana Guzman ([id number]) [phone].pdf Page 3 of 8 []  - 0 External ear exam []  - 0 Specimen Collection (cultures, biopsies, blood, body fluids, etc.) []  - 0 Specimen(s) / Culture(s) sent or taken to Lab for analysis []  - 0 Patient Transfer (multiple staff / Michiel Sites Lift / Similar devices) []  - 0 Simple Staple / Suture removal (25 or less) []  - 0 Complex Staple / Suture removal (26 or more) []  - 0 Hypo / Hyperglycemic Management (close monitor of Blood Glucose) []  - 0 Ankle / Brachial Index (ABI) - do not check if billed separately X- 1 5 Vital Signs Has the patient been seen at the hospital within the last three years: Yes Total Score: 70 Level Of Care: New/Established - Level 2 Electronic Signature(s) Signed: 05/09/2023 5:11:42 PM By: Betha Loa Entered By: Betha Loa on 05/06/2023 08:04:21 -------------------------------------------------------------------------------- Encounter Discharge Information  Details Patient Name: Date of Service: [name], Ariana Guzman. 05/06/2023 10:30 A M Medical Record Number: [id number] Patient Account Number: [id number] Date of Birth/Sex: Treating RN: 01-10-1941 (82 y.o. Ariana Guzman Primary Care Rucker Pridgeon: Bari Edward Other Clinician: Betha Loa Referring Janera Peugh: Treating Damyiah Moxley/Extender: Francee Piccolo in Treatment: 4 Encounter Discharge Information Items Discharge Condition: Stable Ambulatory Status: Cane Discharge Destination: Home Transportation: Private Auto Accompanied By: son Schedule Follow-up Appointment: Yes Clinical Summary of Care: Electronic Signature(s) Signed: 05/09/2023 5:11:42 PM By: Betha Loa Entered By: Betha Loa on 05/06/2023 08:06:14 Ariana Guzman ([id number]) [id number] Page 4 of 8 -------------------------------------------------------------------------------- Lower Extremity Assessment Details Patient Name: Date of Service: [name], Ariana Guzman 05/06/2023 10:30 A M Medical Record Number: [id number] Patient Account Number: [id number] Date of Birth/Sex: Treating RN: [birth year] (82 y.o. Ariana Guzman Primary Care Burel Kahre: Bari Edward Other Clinician: Betha Loa Referring Alfonse Garringer: Treating Neftali Abair/Extender: Francee Piccolo in Treatment: 4 Edema Assessment Assessed: [name]: Yes] [Right: No] Edema: [Left: Ye] [Right: s] Calf Left: Right: Point of Measurement: 36 cm From Medial Instep 38.4 cm Ankle Left: Right: Point of Measurement: 13 cm From Medial Instep 23.5 cm Vascular Assessment Pulses: Dorsalis Pedis Palpable: [Left:Yes] Toe Nail Assessment Left: Right: Thick: No Discolored: No Deformed: No Improper Length and Hygiene: No Electronic Signature(s) Signed: 05/06/2023 12:10:54 PM By: Angelina Pih Signed: 05/09/2023 5:11:42 PM By: Betha Loa Entered By: Betha Loa on 05/06/2023  07:47:22 -------------------------------------------------------------------------------- Multi Wound Chart Details Patient Name: Date of Service: [name], Ariana Guzman. 05/06/2023 10:30 A M Medical Record Number: [id number] Patient Account Number: [id number] Date of Birth/Sex: Treating RN: [birth year] (82 y.o. Ariana Guzman Primary Care Jossilyn Benda: Bari Edward Other Clinician: Betha Loa Referring Anarely Nicholls: Treating Adelynn Gipe/Extender: Francee Piccolo in Treatment: 4 Vital Signs Height(in): 66 Pulse(bpm): 76 Weight(lbs): Blood Pressure(mmHg):  0 0 Wound Description Classification: Full Thickness Without Exposed Support Exudate Amount: None Present Structures Foul Odor After Cleansing: No Slough/Fibrino No Wound Bed Granulation Amount: None Present (0%) Exposed Structure Necrotic Amount: None Present (0%) Fascia Exposed: No Fat Layer (Subcutaneous Tissue) Exposed: Yes Tendon Exposed: No Muscle Exposed: No Joint Exposed: No Bone Exposed: No Treatment Notes Wound #1 (Lower Leg) Wound Laterality: Left, Medial Ariana Guzman, Ariana Guzman ([id number]) [phone].pdf Page 8 of  8 Cleanser Peri-Wound Care Topical Primary Dressing Secondary Dressing Secured With Compression Wrap Compression Stockings Add-Ons Electronic Signature(s) Signed: 05/06/2023 12:10:54 PM By: Angelina Pih Signed: 05/09/2023 5:11:42 PM By: Betha Loa Entered By: Betha Loa on 05/06/2023 07:54:56 -------------------------------------------------------------------------------- Vitals Details Patient Name: Date of Service: [name], Ariana Guzman. 05/06/2023 10:30 A M Medical Record Number: [id number] Patient Account Number: [id number] Date of Birth/Sex: Treating RN: [birth year] (82 y.o. Ariana Guzman Primary Care Bryana Froemming: Bari Edward Other Clinician: Betha Loa Referring Verlaine Embry: Treating Amyla Heffner/Extender: Francee Piccolo in Treatment: 4 Vital Signs Time Taken: 10:40 Temperature (F): 97.6 Height (in): 66 Pulse (bpm): 76 Respiratory Rate (breaths/min): 18 Blood Pressure (mmHg): 164/83 Reference Range: 80 - 120 mg / dl Electronic Signature(s) Signed: 05/09/2023 5:11:42 PM By: Betha Loa Entered By: Betha Loa on 05/06/2023 07:42:13  Ariana Guzman, Ariana Guzman ([id number]) [phone].pdf Page 1 of 8 Visit Report for 05/06/2023 Arrival Information Details Patient Name: Date of Service: GRO CEAlphonse Guzman 05/06/2023 10:30 A M Medical Record Number: [id number] Patient Account Number: [id number] Date of Birth/Sex: Treating RN: Jan 15, 1941 (82 y.o. Ariana Guzman Primary Care Dezaree Tracey: Bari Edward Other Clinician: Betha Loa Referring Malaiah Viramontes: Treating Shanai Lartigue/Extender: Francee Piccolo in Treatment: 4 Visit Information History Since Last Visit All ordered tests and consults were completed: No Patient Arrived: Gilmer Mor Added or deleted any medications: No Arrival Time: 10:39 Any new allergies or adverse reactions: No Transfer Assistance: None Had a fall or experienced change in No Patient Identification Verified: Yes activities of daily living that may affect Secondary Verification Process Completed: Yes risk of falls: Patient Requires Transmission-Based Precautions: No Signs or symptoms of abuse/neglect since last visito No Patient Has Alerts: Yes Hospitalized since last visit: No Patient Alerts: Patient on Blood Thinner Implantable device outside of the clinic excluding No Xarelto cellular tissue based products placed in the center since last visit: Has Dressing in Place as Prescribed: Yes Has Compression in Place as Prescribed: Yes Pain Present Now: No Electronic Signature(s) Signed: 05/09/2023 5:11:42 PM By: Betha Loa Entered By: Betha Loa on 05/06/2023 07:39:56 -------------------------------------------------------------------------------- Clinic Level of Care Assessment Details Patient Name: Date of Service: [name], Ariana Guzman. 05/06/2023 10:30 A M Medical Record Number: [id number] Patient Account Number: [id number] Date of Birth/Sex: Treating RN: [birth year] (82 y.o. Ariana Guzman Primary Care Audrianna Driskill: Bari Edward Other Clinician: Betha Loa Referring Kensie Susman: Treating Manasa Spease/Extender: Francee Piccolo in Treatment: 4 Clinic Level of Care Assessment Items TOOL 4 Quantity Score []  - 0 Use when only an EandM is performed on FOLLOW-UP visit ASSESSMENTS - Nursing Assessment / Reassessment X- 1 10 Reassessment of Co-morbidities (includes updates in patient status) Ariana Guzman, Ariana Guzman ([id number]) [id number].pdf Page 2 of 8 X- 1 5 Reassessment of Adherence to Treatment Plan ASSESSMENTS - Wound and Skin A ssessment / Reassessment X - Simple Wound Assessment / Reassessment - one wound 1 5 []  - 0 Complex Wound Assessment / Reassessment - multiple wounds []  - 0 Dermatologic / Skin Assessment (not related to wound area) ASSESSMENTS - Focused Assessment []  - 0 Circumferential Edema Measurements - multi extremities []  - 0 Nutritional Assessment / Counseling / Intervention []  - 0 Lower Extremity Assessment (monofilament, tuning fork, pulses) []  - 0 Peripheral Arterial Disease Assessment (using hand held doppler) ASSESSMENTS - Ostomy and/or Continence Assessment and Care []  - 0 Incontinence Assessment and Management []  - 0 Ostomy Care Assessment and Management (repouching, etc.) PROCESS - Coordination of Care X - Simple Patient / Family Education for ongoing care 1 15 []  - 0 Complex (extensive) Patient / Family Education for ongoing care []  - 0 Staff obtains Chiropractor, Records, T Results / Process Orders est []  - 0 Staff telephones HHA, Nursing Homes / Clarify orders / etc []  - 0 Routine Transfer to another Facility (non-emergent condition) []  - 0 Routine Hospital Admission (non-emergent condition) []  - 0 New Admissions / Manufacturing engineer / Ordering NPWT Apligraf, etc. , []  - 0 Emergency Hospital Admission (emergent condition) X- 1 10 Simple Discharge Coordination []  - 0 Complex (extensive) Discharge Coordination PROCESS - Special Needs []  - 0 Pediatric / Minor  Patient Management []  - 0 Isolation Patient Management []  - 0 Hearing / Language / Visual special needs []  - 0 Assessment of Community assistance (transportation, D/C planning, etc.) []  - 0 Additional  Ariana Guzman, Ariana Guzman ([id number]) [phone].pdf Page 1 of 8 Visit Report for 05/06/2023 Arrival Information Details Patient Name: Date of Service: GRO CEAlphonse Guzman 05/06/2023 10:30 A M Medical Record Number: [id number] Patient Account Number: [id number] Date of Birth/Sex: Treating RN: Jan 15, 1941 (82 y.o. Ariana Guzman Primary Care Dezaree Tracey: Bari Edward Other Clinician: Betha Loa Referring Malaiah Viramontes: Treating Shanai Lartigue/Extender: Francee Piccolo in Treatment: 4 Visit Information History Since Last Visit All ordered tests and consults were completed: No Patient Arrived: Gilmer Mor Added or deleted any medications: No Arrival Time: 10:39 Any new allergies or adverse reactions: No Transfer Assistance: None Had a fall or experienced change in No Patient Identification Verified: Yes activities of daily living that may affect Secondary Verification Process Completed: Yes risk of falls: Patient Requires Transmission-Based Precautions: No Signs or symptoms of abuse/neglect since last visito No Patient Has Alerts: Yes Hospitalized since last visit: No Patient Alerts: Patient on Blood Thinner Implantable device outside of the clinic excluding No Xarelto cellular tissue based products placed in the center since last visit: Has Dressing in Place as Prescribed: Yes Has Compression in Place as Prescribed: Yes Pain Present Now: No Electronic Signature(s) Signed: 05/09/2023 5:11:42 PM By: Betha Loa Entered By: Betha Loa on 05/06/2023 07:39:56 -------------------------------------------------------------------------------- Clinic Level of Care Assessment Details Patient Name: Date of Service: [name], Ariana Guzman. 05/06/2023 10:30 A M Medical Record Number: [id number] Patient Account Number: [id number] Date of Birth/Sex: Treating RN: [birth year] (82 y.o. Ariana Guzman Primary Care Audrianna Driskill: Bari Edward Other Clinician: Betha Loa Referring Kensie Susman: Treating Manasa Spease/Extender: Francee Piccolo in Treatment: 4 Clinic Level of Care Assessment Items TOOL 4 Quantity Score []  - 0 Use when only an EandM is performed on FOLLOW-UP visit ASSESSMENTS - Nursing Assessment / Reassessment X- 1 10 Reassessment of Co-morbidities (includes updates in patient status) Ariana Guzman, Ariana Guzman ([id number]) [id number].pdf Page 2 of 8 X- 1 5 Reassessment of Adherence to Treatment Plan ASSESSMENTS - Wound and Skin A ssessment / Reassessment X - Simple Wound Assessment / Reassessment - one wound 1 5 []  - 0 Complex Wound Assessment / Reassessment - multiple wounds []  - 0 Dermatologic / Skin Assessment (not related to wound area) ASSESSMENTS - Focused Assessment []  - 0 Circumferential Edema Measurements - multi extremities []  - 0 Nutritional Assessment / Counseling / Intervention []  - 0 Lower Extremity Assessment (monofilament, tuning fork, pulses) []  - 0 Peripheral Arterial Disease Assessment (using hand held doppler) ASSESSMENTS - Ostomy and/or Continence Assessment and Care []  - 0 Incontinence Assessment and Management []  - 0 Ostomy Care Assessment and Management (repouching, etc.) PROCESS - Coordination of Care X - Simple Patient / Family Education for ongoing care 1 15 []  - 0 Complex (extensive) Patient / Family Education for ongoing care []  - 0 Staff obtains Chiropractor, Records, T Results / Process Orders est []  - 0 Staff telephones HHA, Nursing Homes / Clarify orders / etc []  - 0 Routine Transfer to another Facility (non-emergent condition) []  - 0 Routine Hospital Admission (non-emergent condition) []  - 0 New Admissions / Manufacturing engineer / Ordering NPWT Apligraf, etc. , []  - 0 Emergency Hospital Admission (emergent condition) X- 1 10 Simple Discharge Coordination []  - 0 Complex (extensive) Discharge Coordination PROCESS - Special Needs []  - 0 Pediatric / Minor  Patient Management []  - 0 Isolation Patient Management []  - 0 Hearing / Language / Visual special needs []  - 0 Assessment of Community assistance (transportation, D/C planning, etc.) []  - 0 Additional

## 2023-05-12 ENCOUNTER — Other Ambulatory Visit: Payer: Self-pay | Admitting: Internal Medicine

## 2023-05-12 DIAGNOSIS — F5101 Primary insomnia: Secondary | ICD-10-CM

## 2023-05-13 NOTE — Telephone Encounter (Signed)
Requested medication (s) are due for refill today: yes  Requested medication (s) are on the active medication list: yes  Last refill:  04/10/23  Future visit scheduled: yes  Notes to clinic:  Unable to refill per protocol, cannot delegate.      Requested Prescriptions  Pending Prescriptions Disp Refills   ALPRAZolam (XANAX) 0.25 MG tablet [Pharmacy Med Name: ALPRAZOLAM 0.25MG    TAB] 30 tablet 0    Sig: TAKE 1 TABLET BY MOUTH AT BEDTIME     Not Delegated - Psychiatry: Anxiolytics/Hypnotics 2 Failed - 05/12/2023  1:36 PM      Failed - This refill cannot be delegated      Failed - Urine Drug Screen completed in last 360 days      Passed - Patient is not pregnant      Passed - Valid encounter within last 6 months    Recent Outpatient Visits           2 weeks ago Pre-diabetes   [facility], Nyoka Cowden, MD   2 months ago Cellulitis of left lower extremity   [facility], Nyoka Cowden, MD   4 months ago Annual physical exam   [facility] at [facility], Nyoka Cowden, MD   10 months ago Venous stasis dermatitis of both lower extremities   [facility] at [facility], Nyoka Cowden, MD   10 months ago Acute frontal sinusitis, recurrence not specified   [facility] at [facility], Ocie Bob, MD       Future Appointments             In 4 months Judithann Graves, Nyoka Cowden, MD [facility] at [facility], [facility]   In 8 months Judithann Graves, Nyoka Cowden, MD [facility] at [facility], [facility]

## 2023-05-24 ENCOUNTER — Ambulatory Visit (INDEPENDENT_AMBULATORY_CARE_PROVIDER_SITE_OTHER): Payer: Medicare Other

## 2023-05-24 DIAGNOSIS — Z0000 Encounter for general adult medical examination without abnormal findings: Secondary | ICD-10-CM

## 2023-05-24 NOTE — Patient Instructions (Addendum)
Ariana Guzman , Thank you for taking time to come for your Medicare Wellness Visit. I appreciate your ongoing commitment to your health goals. Please review the following plan we discussed and let me know if I can assist you in the future.   Referrals/Orders/Follow-Ups/Clinician Recommendations: none  This is a list of the screening recommended for you and due dates:  Health Maintenance  Topic Date Due   Zoster (Shingles) Vaccine (1 of 2) Never done   DEXA scan (bone density measurement)  Never done   COVID-19 Vaccine (6 - 2023-24 season) 04/17/2023   Mammogram  03/02/2024   Medicare Annual Wellness Visit  05/23/2024   DTaP/Tdap/Td vaccine (2 - Td or Tdap) 02/25/2025   Pneumonia Vaccine  Completed   Flu Shot  Completed   HPV Vaccine  Aged Out    Advanced directives: (ACP Link)Information on Advanced Care Planning can be found at [facility] Advance Health Care Directives Advance Health Care Directives ([email or web address]   Next Medicare Annual Wellness Visit scheduled for next year: Yes   05/29/24 @ 9:35 am by video

## 2023-05-24 NOTE — Progress Notes (Signed)
Subjective:   Ariana Guzman is a 82 y.o. female who presents for Medicare Annual (Subsequent) preventive examination.  Visit Complete: Virtual I connected with  Manfred Shirts on 05/24/23 by a audio enabled telemedicine application and verified that I am speaking with the correct person using two identifiers.  Patient Location: Home  Provider Location: Office/Clinic  I discussed the limitations of evaluation and management by telemedicine. The patient expressed understanding and agreed to proceed.  Vital Signs: Because this visit was a virtual/telehealth visit, some criteria may be missing or patient reported. Any vitals not documented were not able to be obtained and vitals that have been documented are patient reported.  Cardiac Risk Factors include: advanced age (>41men, >47 women);hypertension;dyslipidemia;obesity (BMI >30kg/m2)     Objective:    Today's Vitals   05/24/23 1128  PainSc: 0-No pain   There is no height or weight on file to calculate BMI.     05/24/2023   11:36 AM 01/01/2023   11:33 AM 05/21/2022   11:22 AM 12/29/2021   12:48 PM 12/25/2021   12:10 PM 05/13/2021   11:46 AM 12/08/2020    8:16 AM  Advanced Directives  Does Patient Have a Medical Advance Directive? No Yes No No No No No  Type of Advance Directive  Healthcare Power of Attorney       Does patient want to make changes to medical advance directive?       No - Patient declined  Would patient like information on creating a medical advance directive? No - Patient declined  No - Patient declined   No - Patient declined No - Patient declined    Current Medications (verified) Outpatient Encounter Medications as of 05/24/2023  Medication Sig   albuterol (PROVENTIL) (2.5 MG/3ML) 0.083% nebulizer solution Take 2.5 mg by nebulization every 6 (six) hours as needed.   albuterol (VENTOLIN HFA) 108 (90 Base) MCG/ACT inhaler Inhale 2 puffs into the lungs every 4 (four) hours as needed.   ALPRAZolam (XANAX) 0.25 MG  tablet TAKE 1 TABLET BY MOUTH AT BEDTIME   Ascorbic Acid (VITAMIN C) 1000 MG tablet Take 1,000 mg by mouth daily.   baclofen (LIORESAL) 10 MG tablet Take 1 tablet (10 mg total) by mouth 3 (three) times daily.   Calcium Carb-Cholecalciferol [phone] MG-UNIT TABS Take by mouth.   cetirizine (ZYRTEC) 10 MG tablet Take 10 mg by mouth 2 (two) times daily.   cholecalciferol (VITAMIN D) 1000 units tablet Take 1,000 Units by mouth daily.   clotrimazole-betamethasone (LOTRISONE) cream Apply 1 application topically 2 (two) times daily. To rash under breast   fexofenadine (ALLEGRA) 180 MG tablet Take 180 mg by mouth daily.   Fluticasone-Umeclidin-Vilant (TRELEGY ELLIPTA) 200-62.5-25 MCG/ACT AEPB Inhale into the lungs.   gabapentin (NEURONTIN) 100 MG capsule Take 100 mg by mouth in the morning, at noon, and at bedtime.   ipratropium (ATROVENT) 0.06 % nasal spray Place 2 sprays into both nostrils 4 (four) times daily.   latanoprost (XALATAN) 0.005 % ophthalmic solution 1 drop at bedtime.   levothyroxine (SYNTHROID) 75 MCG tablet TAKE 1 TABLET BY MOUTH ONCE DAILY BEFORE BREAKFAST   losartan (COZAAR) 100 MG tablet Take 1 tablet (100 mg total) by mouth daily.   Magnesium 100 MG CAPS Take by mouth.   MELATONIN PO Take by mouth.   metoprolol tartrate (LOPRESSOR) 25 MG tablet TAKE 1 TABLET BY MOUTH IN THE MORNING AND AT NOON AND AT BEDTIME - TAKE 1 TABLET THREE TIMES DAILY  montelukast (SINGULAIR) 10 MG tablet Take 10 mg by mouth daily.   Multiple Vitamins-Minerals (OCUVITE EYE HEATLH GUMMIES PO) Take by mouth daily.   Omega-3 Fatty Acids (FISH OIL PO) Take by mouth.   Rivaroxaban (XARELTO) 15 MG TABS tablet Take 15 mg by mouth daily with supper.    silver sulfADIAZINE (SILVADENE) 1 % cream Apply 1 Application topically daily.   spironolactone (ALDACTONE) 25 MG tablet Take 1 tablet by mouth twice daily   No facility-administered encounter medications on file as of 05/24/2023.    Allergies  (verified) Tramadol and Meloxicam   History: Past Medical History:  Diagnosis Date   Allergies    Arthritis    Asthma    Atrial fibrillation (HCC)    Hypertension    Peripheral arterial disease (HCC)    Sleep apnea    CPAP   Past Surgical History:  Procedure Laterality Date   ABDOMINAL HYSTERECTOMY  1991   uterus only- still has cervix - done for prolapse   CATARACT EXTRACTION W/PHACO Left 11/24/2020   Procedure: CATARACT EXTRACTION PHACO AND INTRAOCULAR LENS PLACEMENT (IOC) LEFT 6.99 00:46.1;  Surgeon: Nevada Crane, MD;  Location[facility];  Service: Ophthalmology;  Laterality: Left;   CATARACT EXTRACTION W/PHACO Right 12/08/2020   Procedure: CATARACT EXTRACTION PHACO AND INTRAOCULAR LENS PLACEMENT (IOC) RIGHT 5.13 00:34.2;  Surgeon: Nevada Crane, MD;  Location: [facility];  Service: Ophthalmology;  Laterality: Right;   COLONOSCOPY  2009   normal   TOE SURGERY     Family History  Problem Relation Age of Onset   Breast cancer Other 35   Dementia Mother    Cancer Father        prostate and bone   Heart attack Sister    Social History   Socioeconomic History   Marital status: Widowed    Spouse name: Not on file   Number of children: 2   Years of education: Not on file   Highest education level: 12th grade  Occupational History   Occupation: retired  Tobacco Use   Smoking status: Former    Current packs/day: 0.00    Average packs/day: 0.5 packs/day for 29.0 years (14.5 ttl pk-yrs)    Types: Cigarettes    Start date: 09/28/1960    Quit date: 09/28/1989    Years since quitting: 33.6   Smokeless tobacco: Never   Tobacco comments:    smoking cessation materials not required  Vaping Use   Vaping status: Never Used  Substance and Sexual Activity   Alcohol use: No   Drug use: No   Sexual activity: Not Currently  Other Topics Concern   Not on file  Social History Narrative   Pt lives alone   Social Determinants of Health    Financial Resource Strain: Low Risk  (05/24/2023)   Overall Financial Resource Strain (CARDIA)    Difficulty of Paying Living Expenses: Not hard at all  Food Insecurity: No Food Insecurity (05/24/2023)   Hunger Vital Sign    Worried About Running Out of Food in the Last Year: Never true    Ran Out of Food in the Last Year: Never true  Transportation Needs: No Transportation Needs (05/24/2023)   PRAPARE - Administrator, Civil Service (Medical): No    Lack of Transportation (Non-Medical): No  Physical Activity: Insufficiently Active (05/24/2023)   Exercise Vital Sign    Days of Exercise per Week: 3 days    Minutes of Exercise per Session: 30  min  Stress: No Stress Concern Present (05/24/2023)   [employer] of Occupational Health - Occupational Stress Questionnaire    Feeling of Stress : Not at all  Social Connections: Moderately Isolated (05/24/2023)   Social Connection and Isolation Panel [NHANES]    Frequency of Communication with Friends and Family: More than three times a week    Frequency of Social Gatherings with Friends and Family: More than three times a week    Attends Religious Services: More than 4 times per year    Active Member of [employer] or Organizations: No    Attends Banker Meetings: Never    Marital Status: Widowed    Tobacco Counseling Counseling given: Not Answered Tobacco comments: smoking cessation materials not required   Clinical Intake:  Pre-visit preparation completed: Yes  Pain : No/denies pain Pain Score: 0-No pain     Nutritional Status: BMI > 30  Obese Nutritional Risks: None Diabetes: No  How often do you need to have someone help you when you read instructions, pamphlets, or other written materials from your doctor or pharmacy?: 1 - Never  Interpreter Needed?: No  Information entered by :: Kennedy Bucker, LPN   Activities of Daily Living    05/24/2023   11:37 AM  In your present state of health, do you  have any difficulty performing the following activities:  Hearing? 0  Vision? 0  Difficulty concentrating or making decisions? 0  Walking or climbing stairs? 1  Comment back issues- hard to go up stairs, not down  Dressing or bathing? 0  Doing errands, shopping? 0  Preparing Food and eating ? N  Using the Toilet? N  In the past six months, have you accidently leaked urine? N  Do you have problems with loss of bowel control? N  Managing your Medications? N  Managing your Finances? N  Housekeeping or managing your Housekeeping? N    Patient Care Team: Reubin Milan, MD as PCP - General (Internal Medicine) Prescott Parma, Andres Ege, MD as Consulting Physician (Pulmonary Disease) Etta Quill, MD as Consulting Physician (Cardiology) Dr Doyce Loose as Consulting Physician (Sleep Medicine) Nevada Crane, MD as Consulting Physician (Ophthalmology) Wyn Quaker Marlow Baars, MD as Referring Physician (Vascular Surgery)  Indicate any recent Medical Services you may have received from other than [facility] providers in the past year (date may be approximate).     Assessment:   This is a routine wellness examination for [facility].  Hearing/Vision screen Hearing Screening - Comments:: No aids Vision Screening - Comments:: Readers- Dr.King   Goals Addressed             This Visit's Progress    DIET - EAT MORE FRUITS AND VEGETABLES        Depression Screen    05/24/2023   11:34 AM 04/28/2023    9:51 AM 02/14/2023    1:44 PM 01/06/2023   10:42 AM 07/12/2022    3:40 PM 05/21/2022   11:37 AM 02/11/2022    9:21 AM  PHQ 2/9 Scores  PHQ - 2 Score 0 0 0 0 0 0 1  PHQ- 9 Score 0 2 0 2 2  10     Fall Risk    05/24/2023   11:37 AM 04/28/2023    9:51 AM 02/14/2023    1:44 PM 01/06/2023   10:42 AM 07/12/2022    3:40 PM  Fall Risk   Falls in the past year? 0 0 0 0 0  Number falls in past yr:  0 0 0 0 0  Injury with Fall? 0 0 0 0 0  Risk for fall due to : No Fall Risks No Fall Risks No Fall Risks No  Fall Risks No Fall Risks  Follow up Falls prevention discussed;Falls evaluation completed Falls evaluation completed Falls evaluation completed Falls evaluation completed Falls evaluation completed    MEDICARE RISK AT HOME: Medicare Risk at Home Any stairs in or around the home?: No If so, are there any without handrails?: No Home free of loose throw rugs in walkways, pet beds, electrical cords, etc?: Yes Adequate lighting in your home to reduce risk of falls?: Yes Life alert?: No Use of a cane, walker or w/c?: Yes (cane or walker (in home)) Grab bars in the bathroom?: Yes Shower chair or bench in shower?: No Elevated toilet seat or a handicapped toilet?: No  TIMED UP AND GO:  Was the test performed?  No    Cognitive Function:        05/24/2023   11:41 AM 05/21/2022   11:42 AM 05/09/2019   11:52 AM 04/24/2018    2:11 PM  6CIT Screen  What Year? 0 points 0 points 0 points 0 points  What month? 0 points 0 points 0 points 0 points  What time? 0 points 0 points 0 points 3 points  Count back from 20 0 points 0 points 0 points 0 points  Months in reverse 0 points 0 points 0 points 0 points  Repeat phrase 0 points 6 points 0 points 0 points  Total Score 0 points 6 points 0 points 3 points    Immunizations Immunization History  Administered Date(s) Administered   Fluad Quad(high Dose 65+) 05/28/2019, 05/08/2020   Fluad Trivalent(High Dose 65+) 04/28/2023   Influenza, High Dose Seasonal PF 08/11/2017, 04/24/2018   Influenza-Unspecified 05/25/2022   PFIZER(Purple Top)SARS-COV-2 Vaccination 10/08/2019, 10/29/2019, 07/17/2020, 05/16/2021   PNEUMOCOCCAL CONJUGATE-20 04/28/2023   Pfizer(Comirnaty)Fall Seasonal Vaccine 12 years and older 06/11/2022   Pneumococcal Conjugate-13 04/24/2018   Pneumococcal Polysaccharide-23 05/28/2019   Rsv, Bivalent, Protein Subunit Rsvpref,pf Verdis Frederickson) 05/25/2022   Tdap 02/26/2015    TDAP status: Up to date  Flu Vaccine status: Up to  date  Pneumococcal vaccine status: Up to date  Covid-19 vaccine status: Completed vaccines  Qualifies for Shingles Vaccine? Yes   Zostavax completed No   Shingrix Completed?: No.    Education has been provided regarding the importance of this vaccine. Patient has been advised to call insurance company to determine out of pocket expense if they have not yet received this vaccine. Advised may also receive vaccine at local pharmacy or Health Dept. Verbalized acceptance and understanding.  Screening Tests Health Maintenance  Topic Date Due   Zoster Vaccines- Shingrix (1 of 2) Never done   DEXA SCAN  Never done   COVID-19 Vaccine (6 - 2023-24 season) 04/17/2023   MAMMOGRAM  03/02/2024   Medicare Annual Wellness (AWV)  05/23/2024   DTaP/Tdap/Td (2 - Td or Tdap) 02/25/2025   Pneumonia Vaccine 41+ Years old  Completed   INFLUENZA VACCINE  Completed   HPV VACCINES  Aged Out    Health Maintenance  Health Maintenance Due  Topic Date Due   Zoster Vaccines- Shingrix (1 of 2) Never done   DEXA SCAN  Never done   COVID-19 Vaccine (6 - 2023-24 season) 04/17/2023    Colorectal cancer screening: No longer required.   Mammogram status: No longer required due to age.  Declined BDS referral  Lung Cancer Screening: (Low  Dose CT Chest recommended if Age 59-80 years, 20 pack-year currently smoking OR have quit w/in 15years.) does not qualify.    Additional Screening:  Hepatitis C Screening: does not qualify; Completed no  Vision Screening: Recommended annual ophthalmology exams for early detection of glaucoma and other disorders of the eye. Is the patient up to date with their annual eye exam?  Yes  Who is the provider or what is the name of the office in which the patient attends annual eye exams? Dr.King If pt is not established with a provider, would they like to be referred to a provider to establish care? No .   Dental Screening: Recommended annual dental exams for proper oral  hygiene  Community Resource Referral / Chronic Care Management: CRR required this visit?  No   CCM required this visit?  No     Plan:     I have personally reviewed and noted the following in the patient's chart:   Medical and social history Use of alcohol, tobacco or illicit drugs  Current medications and supplements including opioid prescriptions. Patient is not currently taking opioid prescriptions. Functional ability and status Nutritional status Physical activity Advanced directives List of other physicians Hospitalizations, surgeries, and ER visits in previous 12 months Vitals Screenings to include cognitive, depression, and falls Referrals and appointments  In addition, I have reviewed and discussed with patient certain preventive protocols, quality metrics, and best practice recommendations. A written personalized care plan for preventive services as well as general preventive health recommendations were provided to patient.     Hal Hope, LPN   40/04/8118   After Visit Summary: (MyChart) Due to this being a telephonic visit, the after visit summary with patients personalized plan was offered to patient via MyChart   Nurse Notes: none

## 2023-06-28 ENCOUNTER — Other Ambulatory Visit: Payer: Self-pay | Admitting: Internal Medicine

## 2023-06-28 DIAGNOSIS — I10 Essential (primary) hypertension: Secondary | ICD-10-CM

## 2023-06-29 NOTE — Telephone Encounter (Signed)
Last RF 8/524 #180 1 RF  Requested Prescriptions  Refused Prescriptions Disp Refills   spironolactone (ALDACTONE) 25 MG tablet [Pharmacy Med Name: Spironolactone 25 MG Oral Tablet] 180 tablet 0    Sig: Take 1 tablet by mouth twice daily     Cardiovascular: Diuretics - Aldosterone Antagonist Failed - 06/28/2023  1:55 PM      Failed - Last BP in normal range    BP Readings from Last 1 Encounters:  05/02/23 (!) 170/81         Passed - Cr in normal range and within 180 days    Creatinine, Ser  Date Value Ref Range Status  04/28/2023 0.96 0.57 - 1.00 mg/dL Final         Passed - K in normal range and within 180 days    Potassium  Date Value Ref Range Status  04/28/2023 4.8 3.5 - 5.2 mmol/L Final         Passed - Na in normal range and within 180 days    Sodium  Date Value Ref Range Status  04/28/2023 135 134 - 144 mmol/L Final         Passed - eGFR is 30 or above and within 180 days    GFR calc Af Amer  Date Value Ref Range Status  08/14/2020 72 >59 mL/min/1.73 Final    Comment:    **In accordance with recommendations from the NKF-ASN Task force,**   [facility] is in the process of updating its eGFR calculation to the   2021 CKD-EPI creatinine equation that estimates kidney function   without a race variable.    GFR, Estimated  Date Value Ref Range Status  06/05/2020 >60 >60 mL/min Final    Comment:    (NOTE) Calculated using the CKD-EPI Creatinine Equation (2021)    GFR calc non Af Amer  Date Value Ref Range Status  08/14/2020 63 >59 mL/min/1.73 Final   eGFR  Date Value Ref Range Status  04/28/2023 59 (L) >59 mL/min/1.73 Final         Passed - Valid encounter within last 6 months    Recent Outpatient Visits           2 months ago Pre-diabetes   [place] [facility] at [facility], Nyoka Cowden, MD   4 months ago Cellulitis of left lower extremity   [place] [facility] at [facility], Nyoka Cowden, MD   5 months ago Annual physical exam   [facility] at [facility], Nyoka Cowden, MD   11 months ago Venous stasis dermatitis of both lower extremities   [place] [facility] at [facility], Nyoka Cowden, MD   1 year ago Acute frontal sinusitis, recurrence not specified   [facility] at [facility], Ocie Bob, MD       Future Appointments             In 2 months Judithann Graves, Nyoka Cowden, MD [facility] Care & Sports Medicine at [facility], [facility]   In 6 months Judithann Graves, Nyoka Cowden, MD [facility] Primary [facility] & Sports Medicine at [facility], [facility]

## 2023-08-05 DIAGNOSIS — H26493 Other secondary cataract, bilateral: Secondary | ICD-10-CM | POA: Diagnosis not present

## 2023-08-05 LAB — HM DIABETES EYE EXAM

## 2023-08-25 DIAGNOSIS — M5416 Radiculopathy, lumbar region: Secondary | ICD-10-CM | POA: Diagnosis not present

## 2023-08-25 DIAGNOSIS — R7303 Prediabetes: Secondary | ICD-10-CM | POA: Diagnosis not present

## 2023-08-25 IMAGING — CR DG CHEST 2V
3 series · 3 of 3 positions shown · non-contrast
Comparison: 12/25/2021

CLINICAL DATA: Cough, shortness of breath, wheeze

EXAM:
CHEST - 2 VIEW

[chest pa (1 of 2)]
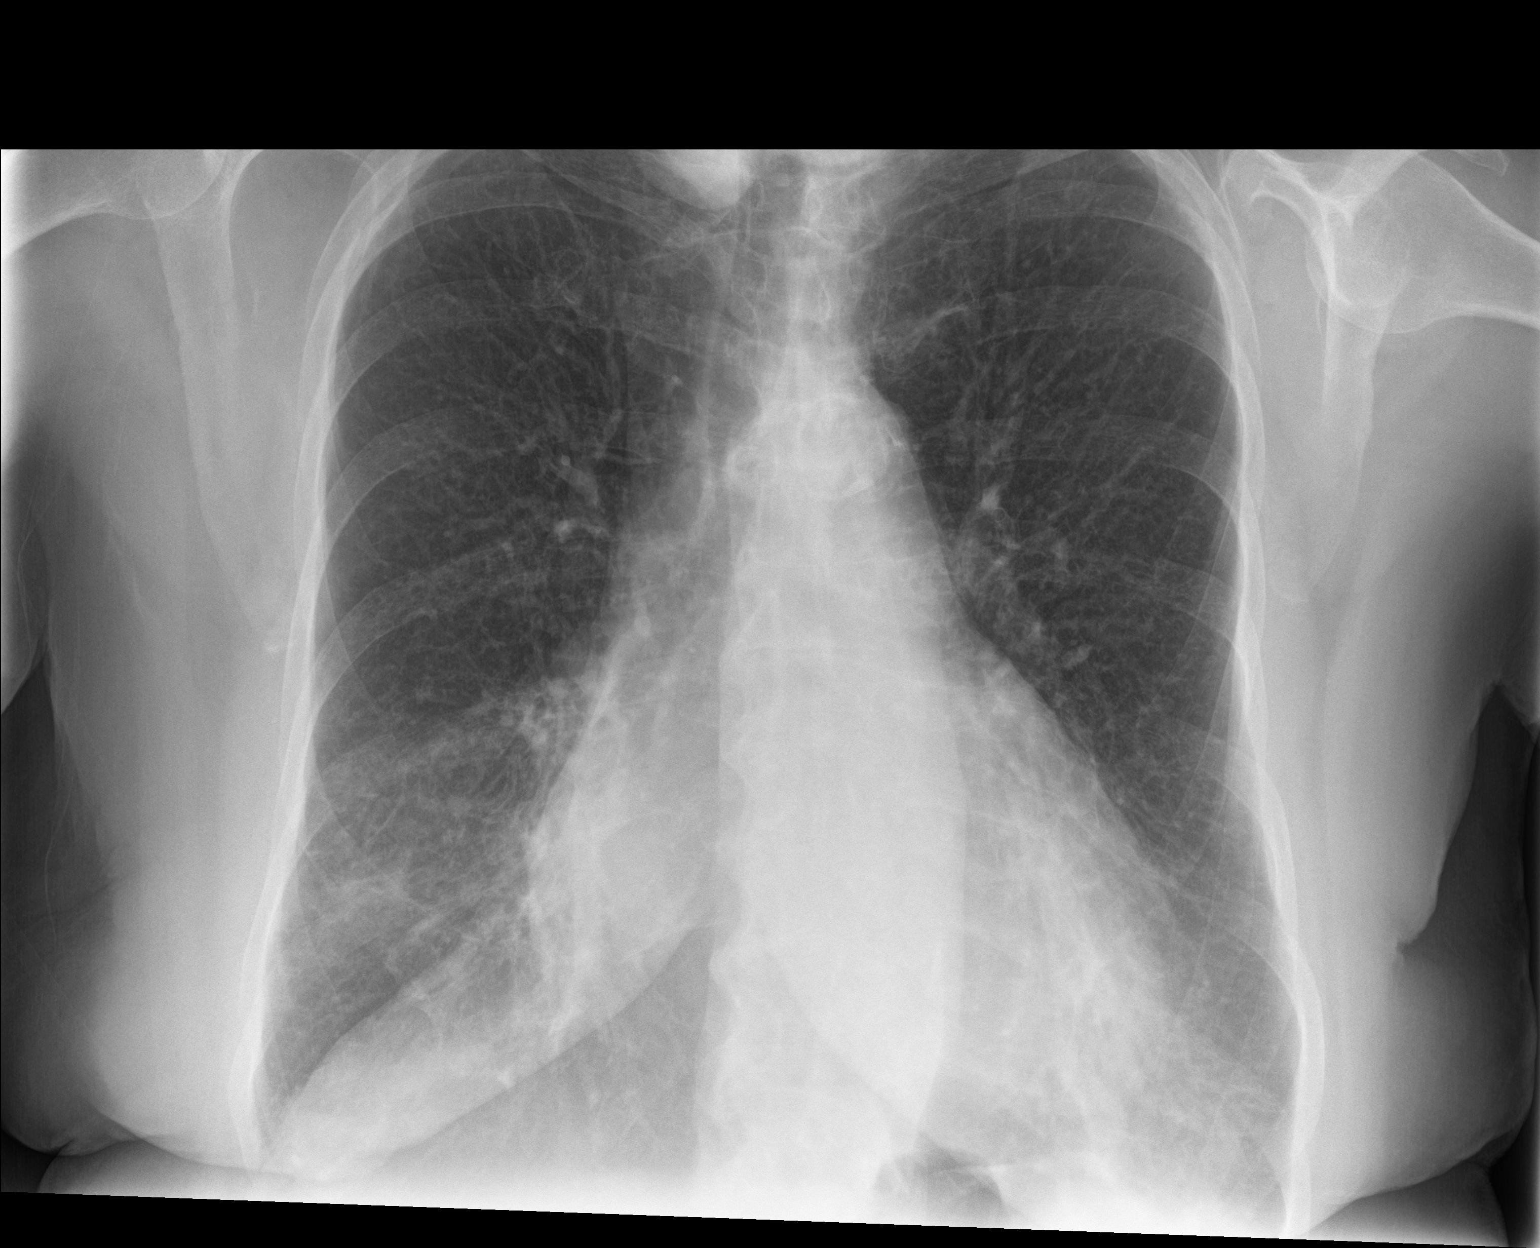

[chest lat]
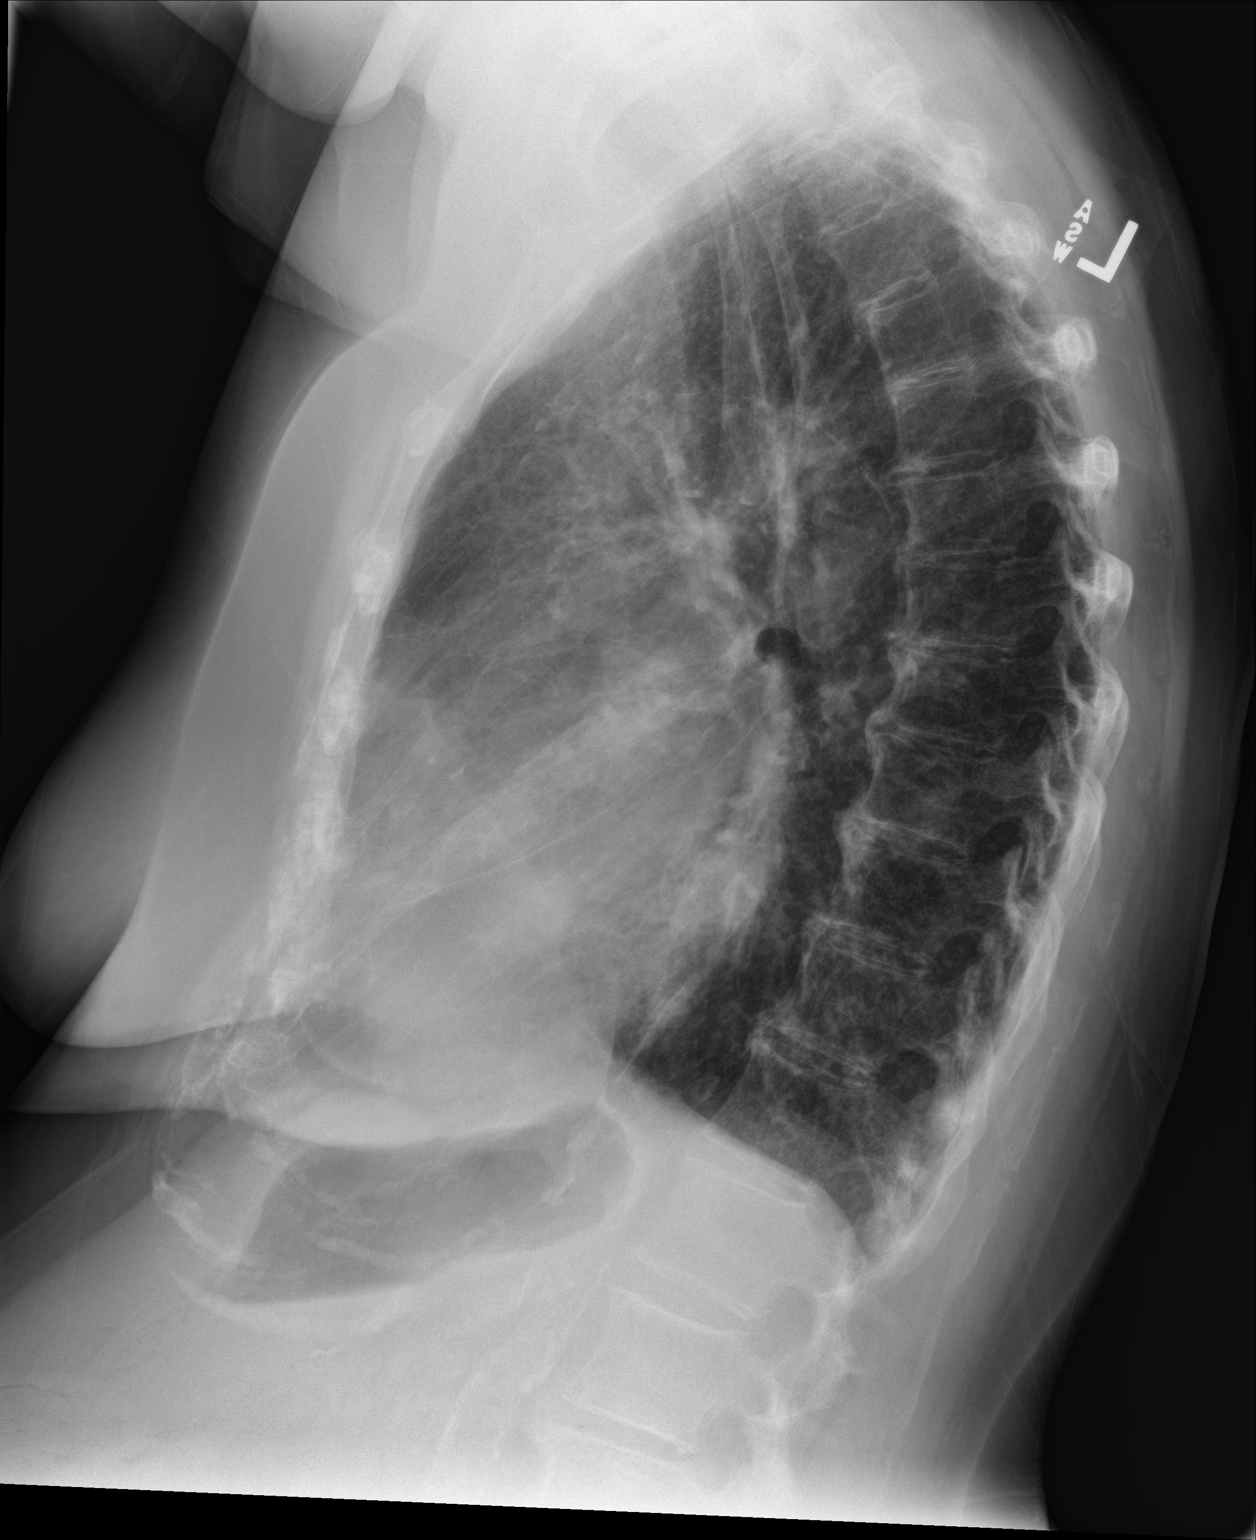

[chest pa (2 of 2)]
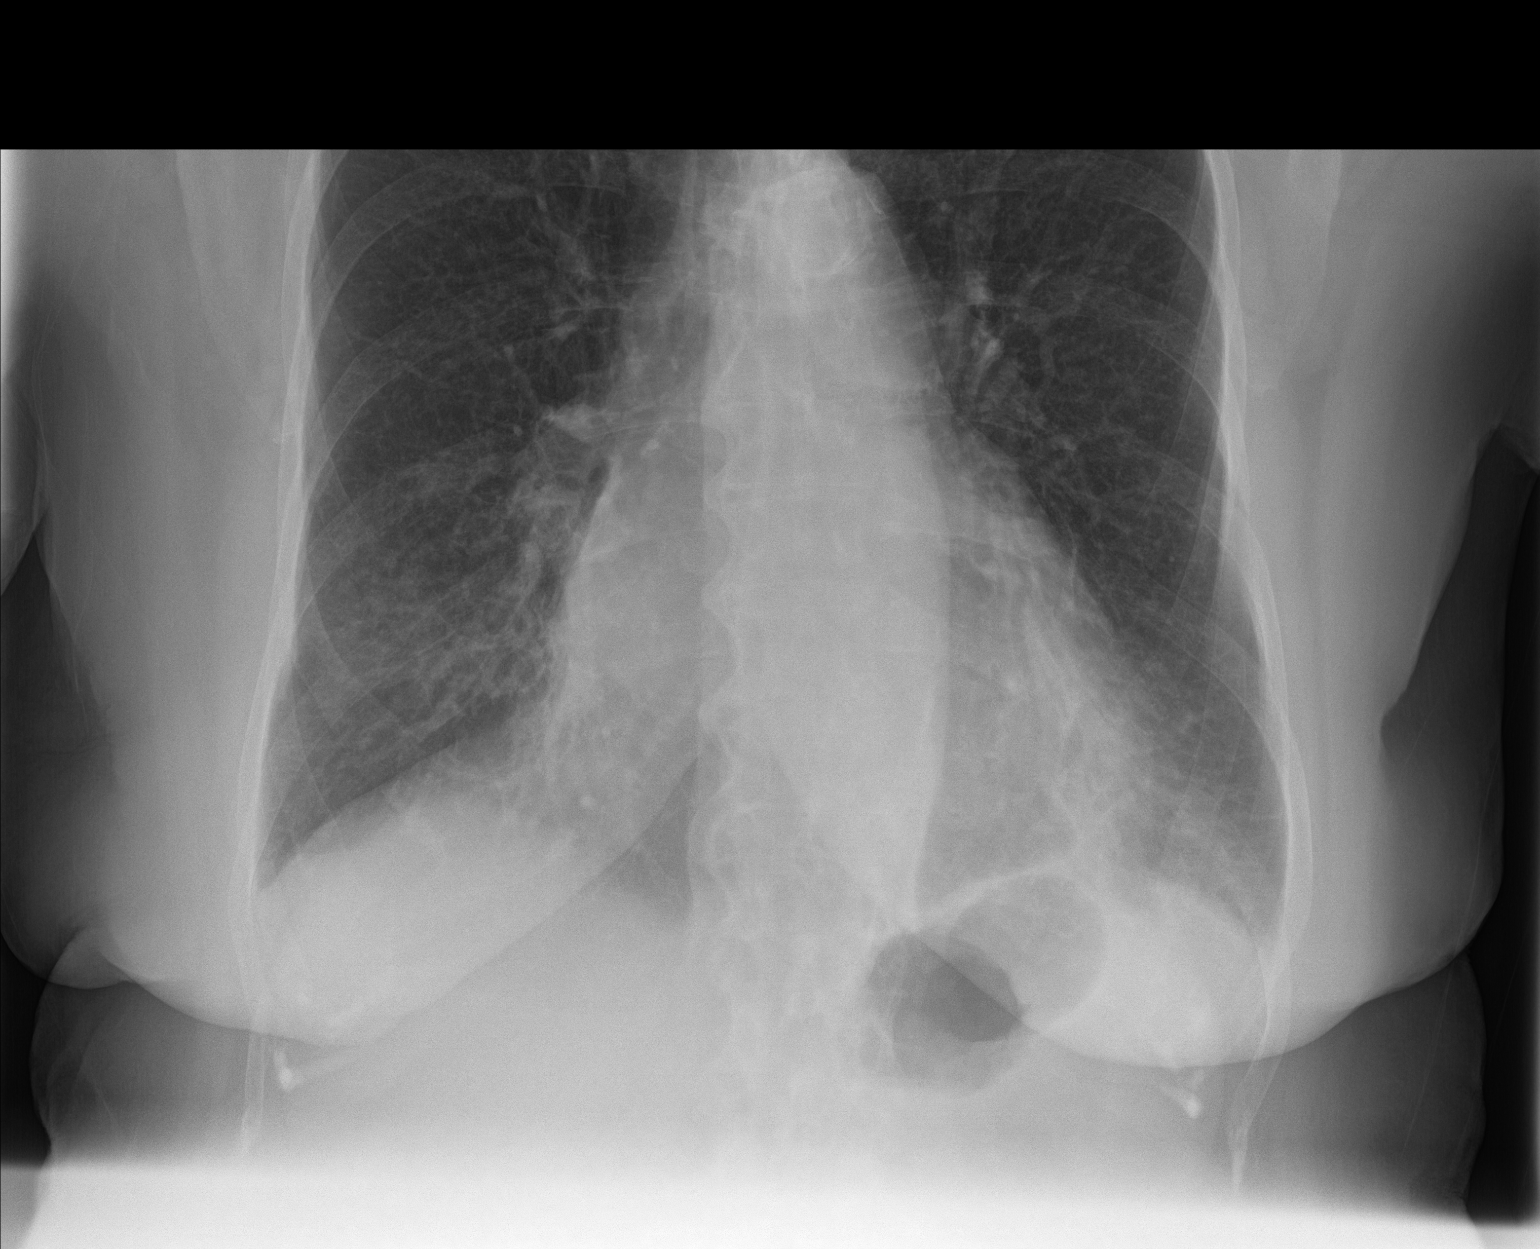

[3 of 3 positions shown; findings below may reference images not displayed]

FINDINGS: Normal cardiac and mediastinal contours. No focal pulmonary opacity.
Redemonstrated apical pleural thickening. No pleural effusion or
pneumothorax. No acute osseous abnormality.
IMPRESSION: No active cardiopulmonary disease.

## 2023-08-29 ENCOUNTER — Ambulatory Visit: Payer: Medicare Other | Admitting: Internal Medicine

## 2023-09-02 DIAGNOSIS — H26493 Other secondary cataract, bilateral: Secondary | ICD-10-CM | POA: Diagnosis not present

## 2023-09-09 DIAGNOSIS — M5416 Radiculopathy, lumbar region: Secondary | ICD-10-CM | POA: Diagnosis not present

## 2023-09-09 DIAGNOSIS — G8929 Other chronic pain: Secondary | ICD-10-CM | POA: Diagnosis not present

## 2023-09-09 DIAGNOSIS — M5442 Lumbago with sciatica, left side: Secondary | ICD-10-CM | POA: Diagnosis not present

## 2023-09-09 DIAGNOSIS — M25551 Pain in right hip: Secondary | ICD-10-CM | POA: Diagnosis not present

## 2023-09-09 DIAGNOSIS — M5441 Lumbago with sciatica, right side: Secondary | ICD-10-CM | POA: Diagnosis not present

## 2023-09-11 ENCOUNTER — Other Ambulatory Visit: Payer: Self-pay | Admitting: Internal Medicine

## 2023-09-11 DIAGNOSIS — F5101 Primary insomnia: Secondary | ICD-10-CM

## 2023-09-11 DIAGNOSIS — E039 Hypothyroidism, unspecified: Secondary | ICD-10-CM

## 2023-09-13 ENCOUNTER — Other Ambulatory Visit: Payer: Self-pay | Admitting: Internal Medicine

## 2023-09-13 DIAGNOSIS — I10 Essential (primary) hypertension: Secondary | ICD-10-CM

## 2023-09-15 DIAGNOSIS — G8929 Other chronic pain: Secondary | ICD-10-CM | POA: Diagnosis not present

## 2023-09-15 DIAGNOSIS — M25551 Pain in right hip: Secondary | ICD-10-CM | POA: Diagnosis not present

## 2023-09-15 DIAGNOSIS — R7303 Prediabetes: Secondary | ICD-10-CM | POA: Diagnosis not present

## 2023-09-15 NOTE — Telephone Encounter (Signed)
Requested Prescriptions  Pending Prescriptions Disp Refills   spironolactone (ALDACTONE) 25 MG tablet [Pharmacy Med Name: Spironolactone 25 MG Oral Tablet] 180 tablet 0    Sig: Take 1 tablet by mouth twice daily     Cardiovascular: Diuretics - Aldosterone Antagonist Failed - 09/15/2023 10:23 AM      Failed - Last BP in normal range    BP Readings from Last 1 Encounters:  05/02/23 (!) 170/81         Passed - Cr in normal range and within 180 days    Creatinine, Ser  Date Value Ref Range Status  04/28/2023 0.96 0.57 - 1.00 mg/dL Final         Passed - K in normal range and within 180 days    Potassium  Date Value Ref Range Status  04/28/2023 4.8 3.5 - 5.2 mmol/L Final         Passed - Na in normal range and within 180 days    Sodium  Date Value Ref Range Status  04/28/2023 135 134 - 144 mmol/L Final         Passed - eGFR is 30 or above and within 180 days    GFR calc Af Amer  Date Value Ref Range Status  08/14/2020 72 >59 mL/min/1.73 Final    Comment:    **In accordance with recommendations from the NKF-ASN Task force,**   [facility] is in the process of updating its eGFR calculation to the   2021 CKD-EPI creatinine equation that estimates kidney function   without a race variable.    GFR, Estimated  Date Value Ref Range Status  06/05/2020 >60 >60 mL/min Final    Comment:    (NOTE) Calculated using the CKD-EPI Creatinine Equation (2021)    GFR calc non Af Amer  Date Value Ref Range Status  08/14/2020 63 >59 mL/min/1.73 Final   eGFR  Date Value Ref Range Status  04/28/2023 59 (L) >59 mL/min/1.73 Final         Passed - Valid encounter within last 6 months    Recent Outpatient Visits           4 months ago Pre-diabetes   [place] [facility] at [facility], Nyoka Cowden, MD   7 months ago Cellulitis of left lower extremity   [place] [facility] at [facility], Nyoka Cowden, MD   8 months ago Annual  physical exam   [facility] at [facility], Nyoka Cowden, MD   1 year ago Venous stasis dermatitis of both lower extremities   [place] [facility] at [facility], Nyoka Cowden, MD   1 year ago Acute frontal sinusitis, recurrence not specified   [facility] Care & [facility] Medicine at [facility], Ocie Bob, MD       Future Appointments             In 1 week Judithann Graves, Nyoka Cowden, MD [facility] Primary Care & Sports Medicine at [facility], [facility]   In 3 months Judithann Graves, Nyoka Cowden, MD [facility] Primary Care & Sports Medicine at [facility], [facility]

## 2023-09-22 ENCOUNTER — Encounter: Payer: Self-pay | Admitting: Internal Medicine

## 2023-09-22 ENCOUNTER — Ambulatory Visit (INDEPENDENT_AMBULATORY_CARE_PROVIDER_SITE_OTHER): Payer: Medicare Other | Admitting: Internal Medicine

## 2023-09-22 VITALS — BP 118/74 | HR 77 | Ht 66.0 in | Wt 205.0 lb

## 2023-09-22 DIAGNOSIS — E039 Hypothyroidism, unspecified: Secondary | ICD-10-CM | POA: Diagnosis not present

## 2023-09-22 DIAGNOSIS — E118 Type 2 diabetes mellitus with unspecified complications: Secondary | ICD-10-CM

## 2023-09-22 DIAGNOSIS — D6869 Other thrombophilia: Secondary | ICD-10-CM | POA: Diagnosis not present

## 2023-09-22 DIAGNOSIS — N1831 Chronic kidney disease, stage 3a: Secondary | ICD-10-CM

## 2023-09-22 DIAGNOSIS — I482 Chronic atrial fibrillation, unspecified: Secondary | ICD-10-CM | POA: Diagnosis not present

## 2023-09-22 DIAGNOSIS — I10 Essential (primary) hypertension: Secondary | ICD-10-CM

## 2023-09-22 NOTE — Progress Notes (Signed)
 Date:  09/22/2023   Name:  Ariana Guzman   DOB:  Nov 06, 1940   MRN:  [id number]   Chief Complaint: Hypertension and Diabetes (Foot Exam.)  Hypertension This is a chronic problem. The problem is controlled. Pertinent negatives include no chest pain, headaches, palpitations or shortness of breath. Past treatments include angiotensin blockers, beta blockers and diuretics. Hypertensive end-organ damage includes kidney disease. There is no history of CAD/MI or CVA. Identifiable causes of hypertension include a thyroid  problem.  Diabetes She presents for her follow-up diabetic visit. She has type 2 (prediabetes progressed to type 2 recently) diabetes mellitus. Pertinent negatives for hypoglycemia include no dizziness or headaches. Pertinent negatives for diabetes include no chest pain, no fatigue and no weakness. There are no hypoglycemic complications. Pertinent negatives for diabetic complications include no CVA. Current diabetic treatment includes diet.  Thyroid  Problem Presents for follow-up visit. Patient reports no constipation, diarrhea, fatigue or palpitations. The symptoms have been stable.    Review of Systems  Constitutional:  Negative for fatigue and unexpected weight change.  HENT:  Negative for nosebleeds.   Eyes:  Negative for visual disturbance.  Respiratory:  Negative for cough, chest tightness, shortness of breath and wheezing.   Cardiovascular:  Negative for chest pain, palpitations and leg swelling.  Gastrointestinal:  Negative for abdominal pain, constipation and diarrhea.  Neurological:  Negative for dizziness, weakness, light-headedness and headaches.     Lab Results  Component Value Date   NA 135 04/28/2023   K 4.8 04/28/2023   CO2 22 04/28/2023   GLUCOSE 108 (H) 04/28/2023   BUN 23 04/28/2023   CREATININE 0.96 04/28/2023   CALCIUM 9.8 04/28/2023   EGFR 59 (L) 04/28/2023   GFRNONAA 63 08/14/2020   Lab Results  Component Value Date   CHOL 133 01/06/2023    HDL 48 01/06/2023   LDLCALC 71 01/06/2023   TRIG 69 01/06/2023   CHOLHDL 2.8 01/06/2023   Lab Results  Component Value Date   TSH 2.850 01/06/2023   Lab Results  Component Value Date   HGBA1C 6.7 (H) 04/28/2023   Lab Results  Component Value Date   WBC 10.1 01/06/2023   HGB 11.9 01/06/2023   HCT 36.3 01/06/2023   MCV 91 01/06/2023   PLT 270 01/06/2023   Lab Results  Component Value Date   ALT 15 01/06/2023   AST 13 01/06/2023   ALKPHOS 62 01/06/2023   BILITOT 1.3 (H) 01/06/2023   No results found for: MARIEN BOLLS, VD25OH   Patient Active Problem List   Diagnosis Date Noted   Obesity, morbid (HCC) 09/22/2023   Stage 3a chronic kidney disease (HCC) 09/22/2023   Ganglion cyst of finger of right hand 07/12/2022   Primary osteoarthritis of left knee 06/11/2022   Spinal stenosis of lumbar region without neurogenic claudication 06/11/2022   Venous stasis dermatitis of both lower extremities 02/11/2022   Slow transit constipation 12/25/2020   Bursitis of hip 06/05/2020   Acquired thrombophilia (HCC) 06/05/2020   Hyponatremia 04/05/2020   Environmental and seasonal allergies 04/03/2020   Type II diabetes mellitus with complication (HCC) 05/29/2018   Primary insomnia 09/15/2017   Generalized anxiety disorder 09/15/2017   Essential hypertension 09/09/2017   Varicose veins of both lower extremities with pain 09/09/2017   Bilateral lower extremity edema 09/09/2017   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/03/2015   Obstructive sleep apnea 10/07/2015   Asthma, persistent controlled 09/25/2015   Acquired hypothyroidism 02/29/2012   Chronic atrial fibrillation (HCC)  02/29/2012   Mixed hyperlipidemia 02/29/2012    Allergies  Allergen Reactions   Tramadol Shortness Of Breath   Meloxicam Rash    Past Surgical History:  Procedure Laterality Date   ABDOMINAL HYSTERECTOMY  1991   uterus only- still has cervix - done for prolapse   CATARACT  EXTRACTION W/PHACO Left 11/24/2020   Procedure: CATARACT EXTRACTION PHACO AND INTRAOCULAR LENS PLACEMENT (IOC) LEFT 6.99 00:46.1;  Surgeon: Myrna Adine Anes, MD;  Location: [facility];  Service: Ophthalmology;  Laterality: Left;   CATARACT EXTRACTION W/PHACO Right 12/08/2020   Procedure: CATARACT EXTRACTION PHACO AND INTRAOCULAR LENS PLACEMENT (IOC) RIGHT 5.13 00:34.2;  Surgeon: Myrna Adine Anes, MD;  Location: [facility];  Service: Ophthalmology;  Laterality: Right;   COLONOSCOPY  2009   normal   TOE SURGERY      Social History   Tobacco Use   Smoking status: Former    Current packs/day: 0.00    Average packs/day: 0.5 packs/day for 29.0 years (14.5 ttl pk-yrs)    Types: Cigarettes    Start date: 09/28/1960    Quit date: 09/28/1989    Years since quitting: 34.0   Smokeless tobacco: Never   Tobacco comments:    smoking cessation materials not required  Vaping Use   Vaping status: Never Used  Substance Use Topics   Alcohol use: No   Drug use: No     Medication list has been reviewed and updated.  Current Meds  Medication Sig   albuterol  (PROVENTIL ) (2.5 MG/3ML) 0.083% nebulizer solution Take 2.5 mg by nebulization every 6 (six) hours as needed.   albuterol  (VENTOLIN  HFA) 108 (90 Base) MCG/ACT inhaler Inhale 2 puffs into the lungs every 4 (four) hours as needed.   ALPRAZolam  (XANAX ) 0.25 MG tablet TAKE 1 TABLET BY MOUTH AT BEDTIME   Ascorbic Acid (VITAMIN C) 1000 MG tablet Take 1,000 mg by mouth daily.   baclofen  (LIORESAL ) 10 MG tablet Take 1 tablet (10 mg total) by mouth 3 (three) times daily.   Calcium Carb-Cholecalciferol 3215761424 MG-UNIT TABS Take by mouth.   cetirizine (ZYRTEC) 10 MG tablet Take 10 mg by mouth 2 (two) times daily.   cholecalciferol (VITAMIN D) 1000 units tablet Take 1,000 Units by mouth daily.   clotrimazole -betamethasone  (LOTRISONE ) cream Apply 1 application topically 2 (two) times daily. To rash under breast   fexofenadine (ALLEGRA)  180 MG tablet Take 180 mg by mouth daily.   Fluticasone -Umeclidin-Vilant (TRELEGY ELLIPTA) 200-62.5-25 MCG/ACT AEPB Inhale into the lungs.   gabapentin (NEURONTIN) 100 MG capsule Take 100 mg by mouth in the morning, at noon, and at bedtime.   ipratropium (ATROVENT ) 0.06 % nasal spray Place 2 sprays into both nostrils 4 (four) times daily.   latanoprost (XALATAN) 0.005 % ophthalmic solution 1 drop at bedtime.   levothyroxine  (SYNTHROID ) 75 MCG tablet TAKE 1 TABLET BY MOUTH ONCE DAILY BEFORE BREAKFAST   losartan  (COZAAR ) 100 MG tablet Take 1 tablet (100 mg total) by mouth daily.   Magnesium 100 MG CAPS Take by mouth.   MELATONIN PO Take by mouth.   metoprolol  tartrate (LOPRESSOR ) 25 MG tablet TAKE 1 TABLET BY MOUTH IN THE MORNING AND AT NOON AND AT BEDTIME - TAKE 1 TABLET THREE TIMES DAILY   montelukast  (SINGULAIR ) 10 MG tablet Take 10 mg by mouth daily.   Multiple Vitamins-Minerals (OCUVITE EYE HEATLH GUMMIES PO) Take by mouth daily.   Omega-3 Fatty Acids (FISH OIL PO) Take by mouth.   Rivaroxaban (XARELTO) 15 MG TABS tablet  Take 15 mg by mouth daily with supper.    silver  sulfADIAZINE  (SILVADENE ) 1 % cream Apply 1 Application topically daily.   spironolactone  (ALDACTONE ) 25 MG tablet Take 1 tablet by mouth twice daily       09/22/2023   11:03 AM 04/28/2023    9:51 AM 02/14/2023    1:44 PM 01/06/2023   10:42 AM  GAD 7 : Generalized Anxiety Score  Nervous, Anxious, on Edge 0 1  0  Control/stop worrying 0 0 0 0  Worry too much - different things 0 0 0 0  Trouble relaxing 0 0 0 0  Restless 0 0 0 0  Easily annoyed or irritable 0 0 0 0  Afraid - awful might happen 0 0 0 0  Total GAD 7 Score 0 1  0  Anxiety Difficulty Not difficult at all Not difficult at all Not difficult at all Not difficult at all       09/22/2023   11:03 AM 05/24/2023   11:34 AM 04/28/2023    9:51 AM  Depression screen PHQ 2/9  Decreased Interest 0 0 0  Down, Depressed, Hopeless 0 0 0  PHQ - 2 Score 0 0 0  Altered  sleeping 0 0 1  Tired, decreased energy 0 0 1  Change in appetite 0 0 0  Feeling bad or failure about yourself  0 0 0  Trouble concentrating 0 0 0  Moving slowly or fidgety/restless 0 0 0  Suicidal thoughts 0 0 0  PHQ-9 Score 0 0 2  Difficult doing work/chores Not difficult at all Not difficult at all Not difficult at all    BP Readings from Last 3 Encounters:  09/22/23 118/74  05/02/23 (!) 170/81  04/28/23 128/72    Physical Exam Vitals and nursing note reviewed.  Constitutional:      General: She is not in acute distress.    Appearance: She is well-developed.  HENT:     Head: Normocephalic and atraumatic.  Cardiovascular:     Rate and Rhythm: Normal rate. Rhythm irregular.     Heart sounds: No murmur heard. Pulmonary:     Effort: Pulmonary effort is normal. No respiratory distress.     Breath sounds: No wheezing or rhonchi.  Abdominal:     Palpations: Abdomen is soft.     Tenderness: There is no abdominal tenderness.  Musculoskeletal:     Cervical back: Normal range of motion.     Right lower leg: No edema.     Left lower leg: No edema.  Lymphadenopathy:     Cervical: No cervical adenopathy.  Skin:    General: Skin is warm and dry.     Findings: No rash.  Neurological:     General: No focal deficit present.     Mental Status: She is alert and oriented to person, place, and time.  Psychiatric:        Mood and Affect: Mood normal.        Behavior: Behavior normal.     Wt Readings from Last 3 Encounters:  09/22/23 205 lb (93 kg)  05/02/23 207 lb 9.6 oz (94.2 kg)  04/28/23 207 lb (93.9 kg)    BP 118/74   Pulse 77   Ht 5' 6 (1.676 m)   Wt 205 lb (93 kg)   SpO2 96%   BMI 33.09 kg/m   Assessment and Plan:  Problem List Items Addressed This Visit       Unprioritized   Essential hypertension -  Primary (Chronic)   Controlled BP with normal exam. Current regimen is losartan , metoprolol  and spironolactone . Will continue same medications; encourage  continued reduced sodium diet.       Acquired hypothyroidism (Chronic)   Supplemented.      Relevant Orders   TSH + free T4   Chronic atrial fibrillation (HCC) (Chronic)   Chronic rate controlled On Xarelto without bleeding      Acquired thrombophilia (HCC) (Chronic)   On Xarelto without bleeding problems.      Type II diabetes mellitus with complication (HCC)   Blood sugars stable without hypoglycemic symptoms or events. Currently managed with diet only. Changes made last visit are none. Lab Results  Component Value Date   HGBA1C 6.7 (H) 04/28/2023         Relevant Orders   Basic metabolic panel   Hemoglobin A1c   Obesity, morbid (HCC)   She is working on reduced carb diet and more physical activity. Her weight is down 2 lbs.      Stage 3a chronic kidney disease (HCC)   Continuing to monitor. Patient reminded to limit NSAIDS      Relevant Orders   Basic metabolic panel    No follow-ups on file.    Leita HILARIO Adie, MD [facility] [place]

## 2023-09-22 NOTE — Assessment & Plan Note (Signed)
 Chronic rate controlled On Xarelto without bleeding

## 2023-09-22 NOTE — Assessment & Plan Note (Addendum)
 Continuing to monitor. Patient reminded to limit NSAIDS

## 2023-09-22 NOTE — Assessment & Plan Note (Signed)
 Supplemented

## 2023-09-22 NOTE — Assessment & Plan Note (Signed)
 She is working on reduced carb diet and more physical activity. Her weight is down 2 lbs.

## 2023-09-22 NOTE — Assessment & Plan Note (Signed)
 Blood sugars stable without hypoglycemic symptoms or events. Currently managed with diet only. Changes made last visit are none. Lab Results  Component Value Date   HGBA1C 6.7 (H) 04/28/2023

## 2023-09-22 NOTE — Assessment & Plan Note (Signed)
 On Xarelto without bleeding problems.

## 2023-09-22 NOTE — Assessment & Plan Note (Signed)
 Controlled BP with normal exam. Current regimen is losartan , metoprolol  and spironolactone . Will continue same medications; encourage continued reduced sodium diet.

## 2023-09-23 ENCOUNTER — Encounter: Payer: Self-pay | Admitting: Internal Medicine

## 2023-09-23 LAB — HEMOGLOBIN A1C
Est. average glucose Bld gHb Est-mCnc: 151 mg/dL
Hgb A1c MFr Bld: 6.9 % — ABNORMAL HIGH (ref 4.8–5.6)

## 2023-09-23 LAB — TSH+FREE T4
Free T4: 1.56 ng/dL (ref 0.82–1.77)
TSH: 2.75 u[IU]/mL (ref 0.450–4.500)

## 2023-09-23 LAB — BASIC METABOLIC PANEL
BUN/Creatinine Ratio: 23 (ref 12–28)
BUN: 24 mg/dL (ref 8–27)
CO2: 20 mmol/L (ref 20–29)
Calcium: 9.4 mg/dL (ref 8.7–10.3)
Chloride: 97 mmol/L (ref 96–106)
Creatinine, Ser: 1.06 mg/dL — ABNORMAL HIGH (ref 0.57–1.00)
Glucose: 108 mg/dL — ABNORMAL HIGH (ref 70–99)
Potassium: 5.4 mmol/L — ABNORMAL HIGH (ref 3.5–5.2)
Sodium: 133 mmol/L — ABNORMAL LOW (ref 134–144)
eGFR: 52 mL/min/{1.73_m2} — ABNORMAL LOW (ref >59)

## 2023-09-29 DIAGNOSIS — G4733 Obstructive sleep apnea (adult) (pediatric): Secondary | ICD-10-CM | POA: Diagnosis not present

## 2023-09-29 DIAGNOSIS — J45998 Other asthma: Secondary | ICD-10-CM | POA: Diagnosis not present

## 2023-09-30 DIAGNOSIS — M25551 Pain in right hip: Secondary | ICD-10-CM | POA: Diagnosis not present

## 2023-09-30 DIAGNOSIS — R29818 Other symptoms and signs involving the nervous system: Secondary | ICD-10-CM | POA: Diagnosis not present

## 2023-09-30 DIAGNOSIS — M5416 Radiculopathy, lumbar region: Secondary | ICD-10-CM | POA: Diagnosis not present

## 2023-09-30 DIAGNOSIS — M5441 Lumbago with sciatica, right side: Secondary | ICD-10-CM | POA: Diagnosis not present

## 2023-09-30 DIAGNOSIS — M5442 Lumbago with sciatica, left side: Secondary | ICD-10-CM | POA: Diagnosis not present

## 2023-09-30 DIAGNOSIS — G8929 Other chronic pain: Secondary | ICD-10-CM | POA: Diagnosis not present

## 2023-10-17 ENCOUNTER — Other Ambulatory Visit: Payer: Self-pay | Admitting: Physician Assistant

## 2023-10-17 ENCOUNTER — Other Ambulatory Visit: Payer: Self-pay | Admitting: Internal Medicine

## 2023-10-17 DIAGNOSIS — F5101 Primary insomnia: Secondary | ICD-10-CM

## 2023-10-17 DIAGNOSIS — I10 Essential (primary) hypertension: Secondary | ICD-10-CM

## 2023-10-17 NOTE — Telephone Encounter (Signed)
 Requested medication (s) are due for refill today: Yes  Requested medication (s) are on the active medication list: Yes  Last refill:  09/12/23  Future visit scheduled: Yes  Notes to clinic:  Unable to refill per protocol, cannot delegate.      Requested Prescriptions  Pending Prescriptions Disp Refills   ALPRAZolam (XANAX) 0.25 MG tablet [Pharmacy Med Name: ALPRAZOLAM 0.25MG    TAB] 30 tablet 0    Sig: TAKE 1 TABLET BY MOUTH AT BEDTIME     Not Delegated - Psychiatry: Anxiolytics/Hypnotics 2 Failed - 10/17/2023  4:47 PM      Failed - This refill cannot be delegated      Failed - Urine Drug Screen completed in last 360 days      Passed - Patient is not pregnant      Passed - Valid encounter within last 6 months    Recent Outpatient Visits           5 months ago Pre-diabetes   [place] [facility] at [facility], Nyoka Cowden, MD   8 months ago Cellulitis of left lower extremity   [place] [facility] at [facility], Nyoka Cowden, MD   9 months ago Annual physical exam   [facility] at [facility], Nyoka Cowden, MD   1 year ago Venous stasis dermatitis of both lower extremities   [place] [facility] at [facility], Nyoka Cowden, MD   1 year ago Acute frontal sinusitis, recurrence not specified   [facility] at [facility], Ocie Bob, MD       Future Appointments             In 2 months Judithann Graves, Nyoka Cowden, MD [facility] at [facility], [facility]

## 2023-10-17 NOTE — Telephone Encounter (Signed)
 Please review.  KP

## 2023-10-18 NOTE — Telephone Encounter (Signed)
 Requested Prescriptions  Pending Prescriptions Disp Refills   metoprolol tartrate (LOPRESSOR) 25 MG tablet [Pharmacy Med Name: Metoprolol Tartrate 25 MG Oral Tablet] 270 tablet 0    Sig: TAKE 1 TABLET BY MOUTH ONCE DAILY IN THE MORNING AND 1 AT NOON AND 1 AT BEDTIME     Cardiovascular:  Beta Blockers Passed - 10/18/2023  1:04 PM      Passed - Last BP in normal range    BP Readings from Last 1 Encounters:  09/22/23 118/74         Passed - Last Heart Rate in normal range    Pulse Readings from Last 1 Encounters:  09/22/23 77         Passed - Valid encounter within last 6 months    Recent Outpatient Visits           5 months ago Pre-diabetes   [place] [facility] at [facility], Nyoka Cowden, MD   8 months ago Cellulitis of left lower extremity   [place] [facility] at [facility], Nyoka Cowden, MD   9 months ago Annual physical exam   [facility] at [facility], Nyoka Cowden, MD   1 year ago Venous stasis dermatitis of both lower extremities   [place] [facility] at [facility], Nyoka Cowden, MD   1 year ago Acute frontal sinusitis, recurrence not specified   [facility] at [facility], Ocie Bob, MD       Future Appointments             In 2 months Judithann Graves, Nyoka Cowden, MD [facility] at [facility], [facility]

## 2023-11-16 ENCOUNTER — Other Ambulatory Visit: Payer: Self-pay | Admitting: Internal Medicine

## 2023-11-16 DIAGNOSIS — F5101 Primary insomnia: Secondary | ICD-10-CM

## 2023-11-17 DIAGNOSIS — I482 Chronic atrial fibrillation, unspecified: Secondary | ICD-10-CM | POA: Diagnosis not present

## 2023-11-23 ENCOUNTER — Encounter: Payer: Self-pay | Admitting: Cardiology

## 2023-11-29 DIAGNOSIS — I482 Chronic atrial fibrillation, unspecified: Secondary | ICD-10-CM | POA: Diagnosis not present

## 2023-11-29 DIAGNOSIS — I10 Essential (primary) hypertension: Secondary | ICD-10-CM | POA: Diagnosis not present

## 2023-12-06 DIAGNOSIS — M21621 Bunionette of right foot: Secondary | ICD-10-CM | POA: Diagnosis not present

## 2023-12-06 DIAGNOSIS — M79672 Pain in left foot: Secondary | ICD-10-CM | POA: Diagnosis not present

## 2023-12-06 DIAGNOSIS — B351 Tinea unguium: Secondary | ICD-10-CM | POA: Diagnosis not present

## 2023-12-06 DIAGNOSIS — M792 Neuralgia and neuritis, unspecified: Secondary | ICD-10-CM | POA: Diagnosis not present

## 2023-12-06 DIAGNOSIS — M2012 Hallux valgus (acquired), left foot: Secondary | ICD-10-CM | POA: Diagnosis not present

## 2023-12-06 DIAGNOSIS — M21622 Bunionette of left foot: Secondary | ICD-10-CM | POA: Diagnosis not present

## 2023-12-06 DIAGNOSIS — M2042 Other hammer toe(s) (acquired), left foot: Secondary | ICD-10-CM | POA: Diagnosis not present

## 2023-12-06 DIAGNOSIS — M2011 Hallux valgus (acquired), right foot: Secondary | ICD-10-CM | POA: Diagnosis not present

## 2023-12-06 DIAGNOSIS — L84 Corns and callosities: Secondary | ICD-10-CM | POA: Diagnosis not present

## 2023-12-06 DIAGNOSIS — M2041 Other hammer toe(s) (acquired), right foot: Secondary | ICD-10-CM | POA: Diagnosis not present

## 2023-12-06 DIAGNOSIS — M79671 Pain in right foot: Secondary | ICD-10-CM | POA: Diagnosis not present

## 2023-12-08 ENCOUNTER — Other Ambulatory Visit: Payer: Self-pay | Admitting: Physician Assistant

## 2023-12-08 DIAGNOSIS — E039 Hypothyroidism, unspecified: Secondary | ICD-10-CM

## 2023-12-09 NOTE — Telephone Encounter (Signed)
 Requested Prescriptions  Pending Prescriptions Disp Refills   levothyroxine  (SYNTHROID ) 75 MCG tablet [Pharmacy Med Name: Levothyroxine  Sodium 75 MCG Oral Tablet] 90 tablet 0    Sig: TAKE 1 TABLET BY MOUTH ONCE DAILY BEFORE BREAKFAST     Endocrinology:  Hypothyroid Agents Passed - 12/09/2023  9:45 AM      Passed - TSH in normal range and within 360 days    TSH  Date Value Ref Range Status  09/22/2023 2.750 0.450 - 4.500 uIU/mL Final         Passed - Valid encounter within last 12 months    Recent Outpatient Visits           2 months ago Essential hypertension   [place] [facility] at [facility], Chales [place], MD       Future Appointments             In 1 month Gala Jubilee, Chales [place], MD [facility] Medicine at [facility], [facility]

## 2023-12-10 ENCOUNTER — Other Ambulatory Visit: Payer: Self-pay | Admitting: Internal Medicine

## 2023-12-10 DIAGNOSIS — I10 Essential (primary) hypertension: Secondary | ICD-10-CM

## 2023-12-13 NOTE — Telephone Encounter (Signed)
 Requested Prescriptions  Pending Prescriptions Disp Refills   spironolactone  (ALDACTONE ) 25 MG tablet [Pharmacy Med Name: Spironolactone  25 MG Oral Tablet] 180 tablet 0    Sig: Take 1 tablet by mouth twice daily     Cardiovascular: Diuretics - Aldosterone Antagonist Failed - 12/13/2023  8:20 AM      Failed - Cr in normal range and within 180 days    Creatinine, Ser  Date Value Ref Range Status  09/22/2023 1.06 (H) 0.57 - 1.00 mg/dL Final         Failed - K in normal range and within 180 days    Potassium  Date Value Ref Range Status  09/22/2023 5.4 (H) 3.5 - 5.2 mmol/L Final         Failed - Na in normal range and within 180 days    Sodium  Date Value Ref Range Status  09/22/2023 133 (L) 134 - 144 mmol/L Final         Passed - eGFR is 30 or above and within 180 days    GFR calc Af Amer  Date Value Ref Range Status  08/14/2020 72 >59 mL/min/1.73 Final    Comment:    **In accordance with recommendations from the NKF-ASN Task force,**   [facility] is in the process of updating its eGFR calculation to the   2021 CKD-EPI creatinine equation that estimates kidney function   without a race variable.    GFR, Estimated  Date Value Ref Range Status  06/05/2020 >60 >60 mL/min Final    Comment:    (NOTE) Calculated using the CKD-EPI Creatinine Equation (2021)    GFR calc non Af Amer  Date Value Ref Range Status  08/14/2020 63 >59 mL/min/1.73 Final   eGFR  Date Value Ref Range Status  09/22/2023 52 (L) >59 mL/min/1.73 Final         Passed - Last BP in normal range    BP Readings from Last 1 Encounters:  09/22/23 118/74         Passed - Valid encounter within last 6 months    Recent Outpatient Visits           2 months ago Essential hypertension   [place] [facility] at [facility], Chales [place], MD       Future Appointments             In 4 weeks Gala Jubilee [place], MD [facility] Medicine at [facility],  [facility]

## 2023-12-21 ENCOUNTER — Other Ambulatory Visit: Payer: Self-pay | Admitting: Physician Assistant

## 2023-12-21 DIAGNOSIS — F5101 Primary insomnia: Secondary | ICD-10-CM

## 2023-12-23 NOTE — Telephone Encounter (Signed)
 Med refill

## 2023-12-23 NOTE — Telephone Encounter (Signed)
 Requested medications are due for refill today.  yes  Requested medications are on the active medications list.  yes  Last refill. 11/17/2023 #30 0 rf  Future visit scheduled.   yes  Notes to clinic.  Refill not delegated.    Requested Prescriptions  Pending Prescriptions Disp Refills   ALPRAZolam  (XANAX ) 0.25 MG tablet [Pharmacy Med Name: ALPRAZOLAM  0.25MG    TAB] 30 tablet 0    Sig: TAKE 1 TABLET BY MOUTH AT BEDTIME     Not Delegated - Psychiatry: Anxiolytics/Hypnotics 2 Failed - 12/23/2023  9:46 AM      Failed - This refill cannot be delegated      Failed - Urine Drug Screen completed in last 360 days      Passed - Patient is not pregnant      Passed - Valid encounter within last 6 months    Recent Outpatient Visits           3 months ago Essential hypertension   [place] [facility] at [facility], Chales [place], MD       Future Appointments             In 1 month Gala Jubilee, [place]ales [place], MD [facility] Medicine at [facility], [facility]

## 2024-01-03 ENCOUNTER — Encounter (INDEPENDENT_AMBULATORY_CARE_PROVIDER_SITE_OTHER): Payer: Self-pay

## 2024-01-11 ENCOUNTER — Encounter: Payer: Self-pay | Admitting: Internal Medicine

## 2024-01-17 DIAGNOSIS — J45998 Other asthma: Secondary | ICD-10-CM | POA: Diagnosis not present

## 2024-01-17 DIAGNOSIS — G4733 Obstructive sleep apnea (adult) (pediatric): Secondary | ICD-10-CM | POA: Diagnosis not present

## 2024-01-25 DIAGNOSIS — G8929 Other chronic pain: Secondary | ICD-10-CM | POA: Diagnosis not present

## 2024-01-25 DIAGNOSIS — M5442 Lumbago with sciatica, left side: Secondary | ICD-10-CM | POA: Diagnosis not present

## 2024-01-25 DIAGNOSIS — M5441 Lumbago with sciatica, right side: Secondary | ICD-10-CM | POA: Diagnosis not present

## 2024-01-25 DIAGNOSIS — L97921 Non-pressure chronic ulcer of unspecified part of left lower leg limited to breakdown of skin: Secondary | ICD-10-CM | POA: Diagnosis not present

## 2024-01-25 DIAGNOSIS — M4807 Spinal stenosis, lumbosacral region: Secondary | ICD-10-CM | POA: Diagnosis not present

## 2024-01-25 DIAGNOSIS — M1611 Unilateral primary osteoarthritis, right hip: Secondary | ICD-10-CM | POA: Diagnosis not present

## 2024-02-06 ENCOUNTER — Other Ambulatory Visit: Payer: Self-pay | Admitting: Internal Medicine

## 2024-02-06 DIAGNOSIS — Z1231 Encounter for screening mammogram for malignant neoplasm of breast: Secondary | ICD-10-CM

## 2024-02-09 ENCOUNTER — Ambulatory Visit (INDEPENDENT_AMBULATORY_CARE_PROVIDER_SITE_OTHER): Admitting: Internal Medicine

## 2024-02-09 ENCOUNTER — Encounter: Payer: Self-pay | Admitting: Internal Medicine

## 2024-02-09 VITALS — BP 104/70 | HR 88 | Ht 66.0 in | Wt 198.0 lb

## 2024-02-09 DIAGNOSIS — I10 Essential (primary) hypertension: Secondary | ICD-10-CM | POA: Diagnosis not present

## 2024-02-09 DIAGNOSIS — Z1231 Encounter for screening mammogram for malignant neoplasm of breast: Secondary | ICD-10-CM | POA: Diagnosis not present

## 2024-02-09 DIAGNOSIS — J45998 Other asthma: Secondary | ICD-10-CM | POA: Diagnosis not present

## 2024-02-09 DIAGNOSIS — Z0000 Encounter for general adult medical examination without abnormal findings: Secondary | ICD-10-CM

## 2024-02-09 DIAGNOSIS — E782 Mixed hyperlipidemia: Secondary | ICD-10-CM

## 2024-02-09 DIAGNOSIS — E039 Hypothyroidism, unspecified: Secondary | ICD-10-CM

## 2024-02-09 DIAGNOSIS — E118 Type 2 diabetes mellitus with unspecified complications: Secondary | ICD-10-CM

## 2024-02-09 DIAGNOSIS — D6869 Other thrombophilia: Secondary | ICD-10-CM

## 2024-02-09 DIAGNOSIS — I482 Chronic atrial fibrillation, unspecified: Secondary | ICD-10-CM | POA: Diagnosis not present

## 2024-02-09 NOTE — Assessment & Plan Note (Addendum)
On Xarelto. No bleeding issues.

## 2024-02-09 NOTE — Progress Notes (Signed)
 Date:  02/09/2024   Name:  Ariana Guzman   DOB:  09/18/1940   MRN:  [id number]   Chief Complaint: Annual Exam Ariana Guzman is a 83 y.o. female who presents today for her Complete Annual Exam. She feels well. She reports exercising - none. She reports she is sleeping fairly well. Breast complaints - none.  She is most bothered by neck, low back and hip pain which significantly limits her activities.  Health Maintenance  Topic Date Due   COVID-19 Vaccine (6 - 2024-25 season) 04/17/2023   Zoster (Shingles) Vaccine (1 of 2) 05/11/2024*   DEXA scan (bone density measurement)  02/08/2025*   Mammogram  03/02/2024   Flu Shot  03/16/2024   Hemoglobin A1C  03/21/2024   Complete foot exam   04/04/2024   Yearly kidney health urinalysis for diabetes  04/27/2024   Medicare Annual Wellness Visit  05/23/2024   Eye exam for diabetics  08/04/2024   Yearly kidney function blood test for diabetes  09/21/2024   DTaP/Tdap/Td vaccine (2 - Td or Tdap) 02/25/2025   Pneumococcal Vaccine for age over 90  Completed   Hepatitis B Vaccine  Aged Out   HPV Vaccine  Aged Out   Meningitis B Vaccine  Aged Out  *Topic was postponed. The date shown is not the original due date.    Hypertension Pertinent negatives include no chest pain, headaches, palpitations or shortness of breath. Identifiable causes of hypertension include a thyroid  problem.  Diabetes Pertinent negatives for hypoglycemia include no dizziness, headaches or nervousness/anxiousness. Pertinent negatives for diabetes include no chest pain, no fatigue and no weakness.  Hyperlipidemia Pertinent negatives include no chest pain, myalgias or shortness of breath.  Thyroid  Problem Patient reports no anxiety, constipation, diarrhea, fatigue or palpitations. Her past medical history is significant for hyperlipidemia.  Asthma There is no cough, shortness of breath or wheezing. Pertinent negatives include no chest pain, headaches, myalgias or trouble  swallowing. Her past medical history is significant for asthma.    Review of Systems  Constitutional:  Negative for chills, fatigue and unexpected weight change.  HENT:  Negative for trouble swallowing.   Eyes:  Negative for visual disturbance.  Respiratory:  Negative for cough, chest tightness, shortness of breath and wheezing.   Cardiovascular:  Negative for chest pain, palpitations and leg swelling.  Gastrointestinal:  Negative for abdominal pain, constipation and diarrhea.  Genitourinary:  Negative for dysuria, frequency and urgency.  Musculoskeletal:  Positive for arthralgias, back pain and gait problem. Negative for myalgias.  Neurological:  Negative for dizziness, weakness, light-headedness and headaches.  Hematological:  Bruises/bleeds easily.  Psychiatric/Behavioral:  Positive for sleep disturbance. Negative for dysphoric mood. The patient is not nervous/anxious.      Lab Results  Component Value Date   NA 133 (L) 09/22/2023   K 5.4 (H) 09/22/2023   CO2 20 09/22/2023   GLUCOSE 108 (H) 09/22/2023   BUN 24 09/22/2023   CREATININE 1.06 (H) 09/22/2023   CALCIUM 9.4 09/22/2023   EGFR 52 (L) 09/22/2023   GFRNONAA 63 08/14/2020   Lab Results  Component Value Date   CHOL 133 01/06/2023   HDL 48 01/06/2023   LDLCALC 71 01/06/2023   TRIG 69 01/06/2023   CHOLHDL 2.8 01/06/2023   Lab Results  Component Value Date   TSH 2.750 09/22/2023   Lab Results  Component Value Date   HGBA1C 6.9 (H) 09/22/2023   Lab Results  Component Value Date   WBC 10.1 01/06/2023  HGB 11.9 01/06/2023   HCT 36.3 01/06/2023   MCV 91 01/06/2023   PLT 270 01/06/2023   Lab Results  Component Value Date   ALT 15 01/06/2023   AST 13 01/06/2023   ALKPHOS 62 01/06/2023   BILITOT 1.3 (H) 01/06/2023   No results found for: MARIEN BOLLS, VD25OH   Patient Active Problem List   Diagnosis Date Noted   Obesity, morbid (HCC) 09/22/2023   Stage 3a chronic kidney disease (HCC)  09/22/2023   Ganglion cyst of finger of right hand 07/12/2022   Primary osteoarthritis of left knee 06/11/2022   Spinal stenosis of lumbar region without neurogenic claudication 06/11/2022   Venous stasis dermatitis of both lower extremities 02/11/2022   Slow transit constipation 12/25/2020   Bursitis of hip 06/05/2020   Acquired thrombophilia (HCC) 06/05/2020   Hyponatremia 04/05/2020   Environmental and seasonal allergies 04/03/2020   Type II diabetes mellitus with complication (HCC) 05/29/2018   Primary insomnia 09/15/2017   Generalized anxiety disorder 09/15/2017   Essential hypertension 09/09/2017   Varicose veins of both lower extremities with pain 09/09/2017   Bilateral lower extremity edema 09/09/2017   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/03/2015   Obstructive sleep apnea 10/07/2015   Asthma, persistent controlled 09/25/2015   Acquired hypothyroidism 02/29/2012   Chronic atrial fibrillation (HCC) 02/29/2012   Mixed hyperlipidemia 02/29/2012    Allergies  Allergen Reactions   Tramadol Shortness Of Breath   Meloxicam Rash    Past Surgical History:  Procedure Laterality Date   ABDOMINAL HYSTERECTOMY  1991   uterus only- still has cervix - done for prolapse   CATARACT EXTRACTION W/PHACO Left 11/24/2020   Procedure: CATARACT EXTRACTION PHACO AND INTRAOCULAR LENS PLACEMENT (IOC) LEFT 6.99 00:46.1;  Surgeon: Myrna Adine Anes, MD;  Location: [facility];  Service: Ophthalmology;  Laterality: Left;   CATARACT EXTRACTION W/PHACO Right 12/08/2020   Procedure: CATARACT EXTRACTION PHACO AND INTRAOCULAR LENS PLACEMENT (IOC) RIGHT 5.13 00:34.2;  Surgeon: Myrna Adine Anes, MD;  Location: [facility];  Service: Ophthalmology;  Laterality: Right;   COLONOSCOPY  2009   normal   TOE SURGERY      Social History   Tobacco Use   Smoking status: Former    Current packs/day: 0.00    Average packs/day: 0.5 packs/day for 29.0 years (14.5 ttl pk-yrs)     Types: Cigarettes    Start date: 09/28/1960    Quit date: 09/28/1989    Years since quitting: 34.3   Smokeless tobacco: Never   Tobacco comments:    smoking cessation materials not required  Vaping Use   Vaping status: Never Used  Substance Use Topics   Alcohol use: No   Drug use: No     Medication list has been reviewed and updated.  Current Meds  Medication Sig   albuterol  (PROVENTIL ) (2.5 MG/3ML) 0.083% nebulizer solution Take 2.5 mg by nebulization every 6 (six) hours as needed.   albuterol  (VENTOLIN  HFA) 108 (90 Base) MCG/ACT inhaler Inhale 2 puffs into the lungs every 4 (four) hours as needed.   ALPRAZolam  (XANAX ) 0.25 MG tablet TAKE 1 TABLET BY MOUTH AT BEDTIME   Ascorbic Acid (VITAMIN C) 1000 MG tablet Take 1,000 mg by mouth daily.   Calcium Carb-Cholecalciferol [phone] MG-UNIT TABS Take by mouth.   cetirizine (ZYRTEC) 10 MG tablet Take 10 mg by mouth 2 (two) times daily.   cholecalciferol (VITAMIN D) 1000 units tablet Take 1,000 Units by mouth daily.   Fluticasone-Umeclidin-Vilant (TRELEGY ELLIPTA) 200-62.5-25 MCG/ACT  AEPB Inhale into the lungs.   gabapentin (NEURONTIN) 100 MG capsule Take 100 mg by mouth in the morning, at noon, and at bedtime.   ipratropium (ATROVENT ) 0.06 % nasal spray Place 2 sprays into both nostrils 4 (four) times daily.   latanoprost (XALATAN) 0.005 % ophthalmic solution 1 drop at bedtime.   levothyroxine  (SYNTHROID ) 75 MCG tablet TAKE 1 TABLET BY MOUTH ONCE DAILY BEFORE BREAKFAST   losartan  (COZAAR ) 100 MG tablet Take 1 tablet (100 mg total) by mouth daily.   Magnesium 100 MG CAPS Take by mouth.   MELATONIN PO Take by mouth.   metoprolol  tartrate (LOPRESSOR ) 25 MG tablet TAKE 1 TABLET BY MOUTH ONCE DAILY IN THE MORNING AND 1 AT NOON AND 1 AT BEDTIME   montelukast  (SINGULAIR ) 10 MG tablet Take 10 mg by mouth daily.   Multiple Vitamins-Minerals (OCUVITE EYE HEATLH GUMMIES PO) Take by mouth daily.   Rivaroxaban (XARELTO) 15 MG TABS tablet Take 15  mg by mouth daily with supper.    spironolactone  (ALDACTONE ) 25 MG tablet Take 1 tablet by mouth twice daily       02/09/2024   10:41 AM 09/22/2023   11:03 AM 04/28/2023    9:51 AM 02/14/2023    1:44 PM  GAD 7 : Generalized Anxiety Score  Nervous, Anxious, on Edge 0 0 1   Control/stop worrying 0 0 0 0  Worry too much - different things 0 0 0 0  Trouble relaxing 0 0 0 0  Restless 0 0 0 0  Easily annoyed or irritable 0 0 0 0  Afraid - awful might happen 0 0 0 0  Total GAD 7 Score 0 0 1   Anxiety Difficulty Not difficult at all Not difficult at all Not difficult at all Not difficult at all       02/09/2024   10:41 AM 09/22/2023   11:03 AM 05/24/2023   11:34 AM  Depression screen PHQ 2/9  Decreased Interest 0 0 0  Down, Depressed, Hopeless 0 0 0  PHQ - 2 Score 0 0 0  Altered sleeping 0 0 0  Tired, decreased energy 0 0 0  Change in appetite 0 0 0  Feeling bad or failure about yourself  0 0 0  Trouble concentrating 0 0 0  Moving slowly or fidgety/restless 0 0 0  Suicidal thoughts 0 0 0  PHQ-9 Score 0 0 0  Difficult doing work/chores Not difficult at all Not difficult at all Not difficult at all    BP Readings from Last 3 Encounters:  02/09/24 104/70  09/22/23 118/74  05/02/23 (!) 170/81    Physical Exam Vitals and nursing note reviewed.  Constitutional:      General: She is not in acute distress.    Appearance: She is well-developed.  HENT:     Head: Normocephalic and atraumatic.     Right Ear: Tympanic membrane and ear canal normal.     Left Ear: Tympanic membrane and ear canal normal.     Nose:     Right Sinus: No maxillary sinus tenderness.     Left Sinus: No maxillary sinus tenderness.   Eyes:     General: No scleral icterus.       Right eye: No discharge.        Left eye: No discharge.     Conjunctiva/sclera: Conjunctivae normal.   Neck:     Thyroid : No thyromegaly.     Vascular: No carotid bruit.   Cardiovascular:  Rate and Rhythm: Normal rate. Rhythm  irregular.     Pulses: Normal pulses.     Heart sounds: Normal heart sounds. No murmur heard. Pulmonary:     Effort: Pulmonary effort is normal. No respiratory distress.     Breath sounds: No wheezing.  Abdominal:     General: Bowel sounds are normal.     Palpations: Abdomen is soft.     Tenderness: There is no abdominal tenderness.   Musculoskeletal:     Cervical back: Normal range of motion. No erythema.     Right lower leg: No edema.     Left lower leg: No edema.  Lymphadenopathy:     Cervical: No cervical adenopathy.   Skin:    General: Skin is warm and dry.     Capillary Refill: Capillary refill takes less than 2 seconds.     Findings: No rash.   Neurological:     Mental Status: She is alert and oriented to person, place, and time.     Cranial Nerves: No cranial nerve deficit.     Sensory: No sensory deficit.     Gait: Gait abnormal (uses walker/rolator).     Deep Tendon Reflexes: Reflexes are normal and symmetric.   Psychiatric:        Attention and Perception: Attention normal.        Mood and Affect: Mood normal.     Wt Readings from Last 3 Encounters:  02/09/24 198 lb (89.8 kg)  09/22/23 205 lb (93 kg)  05/02/23 207 lb 9.6 oz (94.2 kg)    BP 104/70   Pulse 88   Ht 5' 6 (1.676 m)   Wt 198 lb (89.8 kg)   SpO2 98%   BMI 31.96 kg/m   Assessment and Plan:  Problem List Items Addressed This Visit       Unprioritized   Essential hypertension (Chronic)   Blood pressure is well controlled.  Current medications are spironolactone , losartan  and metoprolol . Will continue same regimen along with efforts to limit dietary sodium.       Relevant Orders   CBC with Differential/Platelet   Acquired hypothyroidism (Chronic)   Supplemented. Lab Results  Component Value Date   TSH 2.750 09/22/2023         Relevant Orders   TSH + free T4   Asthma, persistent controlled (Chronic)   Followed by Pulmonary On Trelegy and prn albuterol  MDI and nebs       Relevant Orders   CBC with Differential/Platelet   Chronic atrial fibrillation (HCC) (Chronic)   Rate controlled on metoprolol . She is not aware of the irregular rate.      Type II diabetes mellitus with complication (HCC) (Chronic)   Blood sugars have been stable.  No recent hypoglycemic events requiring assistance. Currently medications are none - controlled with diet only.. Lab Results  Component Value Date   HGBA1C 6.9 (H) 09/22/2023   Last visit no changes were made.       Relevant Orders   Comprehensive metabolic panel with GFR   Hemoglobin A1c   Microalbumin / creatinine urine ratio   Acquired thrombophilia (HCC) (Chronic)   On Xarelto No bleeding issues      Mixed hyperlipidemia   LDL is  Lab Results  Component Value Date   LDLCALC 71 01/06/2023   Current regimen is diet only.  No medication is being taken per patient decision. Goal LDL is <55.       Relevant Orders   Lipid panel  Other Visit Diagnoses       Annual physical exam    -  Primary   up to date on screenings and immunizations     Encounter for screening mammogram for breast cancer       scheduled       No follow-ups on file.    Leita HILARIO Adie, MD [facility] [place]

## 2024-02-09 NOTE — Assessment & Plan Note (Signed)
 Supplemented. Lab Results  Component Value Date   TSH 2.750 09/22/2023

## 2024-02-09 NOTE — Assessment & Plan Note (Signed)
 Followed by Pulmonary On Trelegy and prn albuterol  MDI and nebs

## 2024-02-09 NOTE — Assessment & Plan Note (Signed)
 Blood sugars have been stable.  No recent hypoglycemic events requiring assistance. Currently medications are none - controlled with diet only.. Lab Results  Component Value Date   HGBA1C 6.9 (H) 09/22/2023   Last visit no changes were made.

## 2024-02-09 NOTE — Assessment & Plan Note (Signed)
 LDL is  Lab Results  Component Value Date   LDLCALC 71 01/06/2023   Current regimen is diet only.  No medication is being taken per patient decision. Goal LDL is <55.

## 2024-02-09 NOTE — Assessment & Plan Note (Signed)
 Blood pressure is well controlled.  Current medications are spironolactone , losartan  and metoprolol . Will continue same regimen along with efforts to limit dietary sodium.

## 2024-02-09 NOTE — Assessment & Plan Note (Signed)
 Rate controlled on metoprolol . She is not aware of the irregular rate.

## 2024-02-10 ENCOUNTER — Ambulatory Visit: Payer: Self-pay | Admitting: Internal Medicine

## 2024-02-10 LAB — COMPREHENSIVE METABOLIC PANEL WITH GFR
ALT: 12 IU/L (ref 0–32)
AST: 12 IU/L (ref 0–40)
Albumin: 4.1 g/dL (ref 3.7–4.7)
Alkaline Phosphatase: 77 IU/L (ref 44–121)
BUN/Creatinine Ratio: 26 (ref 12–28)
BUN: 24 mg/dL (ref 8–27)
Bilirubin Total: 1 mg/dL (ref 0.0–1.2)
CO2: 21 mmol/L (ref 20–29)
Calcium: 9.5 mg/dL (ref 8.7–10.3)
Chloride: 95 mmol/L — ABNORMAL LOW (ref 96–106)
Creatinine, Ser: 0.91 mg/dL (ref 0.57–1.00)
Globulin, Total: 2.1 g/dL (ref 1.5–4.5)
Glucose: 100 mg/dL — ABNORMAL HIGH (ref 70–99)
Potassium: 4.8 mmol/L (ref 3.5–5.2)
Sodium: 133 mmol/L — ABNORMAL LOW (ref 134–144)
Total Protein: 6.2 g/dL (ref 6.0–8.5)
eGFR: 62 mL/min/{1.73_m2} (ref >59)

## 2024-02-10 LAB — HEMOGLOBIN A1C
Est. average glucose Bld gHb Est-mCnc: 137 mg/dL
Hgb A1c MFr Bld: 6.4 % — ABNORMAL HIGH (ref 4.8–5.6)

## 2024-02-10 LAB — CBC WITH DIFFERENTIAL/PLATELET
Basophils Absolute: 0.1 10*3/uL (ref 0.0–0.2)
Basos: 1 %
EOS (ABSOLUTE): 0.8 10*3/uL — ABNORMAL HIGH (ref 0.0–0.4)
Eos: 8 %
Hematocrit: 41.3 % (ref 34.0–46.6)
Hemoglobin: 13.1 g/dL (ref 11.1–15.9)
Immature Grans (Abs): 0.1 10*3/uL (ref 0.0–0.1)
Immature Granulocytes: 1 %
Lymphocytes Absolute: 2.1 10*3/uL (ref 0.7–3.1)
Lymphs: 23 %
MCH: 29.7 pg (ref 26.6–33.0)
MCHC: 31.7 g/dL (ref 31.5–35.7)
MCV: 94 fL (ref 79–97)
Monocytes Absolute: 0.9 10*3/uL (ref 0.1–0.9)
Monocytes: 9 %
Neutrophils Absolute: 5.6 10*3/uL (ref 1.4–7.0)
Neutrophils: 58 %
Platelets: 305 10*3/uL (ref 150–450)
RBC: 4.41 x10E6/uL (ref 3.77–5.28)
RDW: 12.6 % (ref 11.7–15.4)
WBC: 9.5 10*3/uL (ref 3.4–10.8)

## 2024-02-10 LAB — LIPID PANEL
Chol/HDL Ratio: 3.1 ratio (ref 0.0–4.4)
Cholesterol, Total: 148 mg/dL (ref 100–199)
HDL: 47 mg/dL (ref >39)
LDL Chol Calc (NIH): 88 mg/dL (ref 0–99)
Triglycerides: 64 mg/dL (ref 0–149)
VLDL Cholesterol Cal: 13 mg/dL (ref 5–40)

## 2024-02-10 LAB — TSH+FREE T4
Free T4: 1.56 ng/dL (ref 0.82–1.77)
TSH: 2.11 u[IU]/mL (ref 0.450–4.500)

## 2024-02-10 LAB — MICROALBUMIN / CREATININE URINE RATIO
Creatinine, Urine: 65.5 mg/dL
Microalb/Creat Ratio: 23 mg/g{creat} (ref 0–29)
Microalbumin, Urine: 15.3 ug/mL

## 2024-02-15 ENCOUNTER — Other Ambulatory Visit: Payer: Self-pay | Admitting: Internal Medicine

## 2024-02-15 DIAGNOSIS — F5101 Primary insomnia: Secondary | ICD-10-CM

## 2024-02-17 NOTE — Telephone Encounter (Signed)
 Requested medications are due for refill today.  yes  Requested medications are on the active medications list.  yes  Last refill. 12/23/2023 #30 1 rf  Future visit scheduled.   yes  Notes to clinic.  Refill not delegated.    Requested Prescriptions  Pending Prescriptions Disp Refills   ALPRAZolam  (XANAX ) 0.25 MG tablet [Pharmacy Med Name: ALPRAZOLAM  0.25MG    TAB] 30 tablet 0    Sig: TAKE 1 TABLET BY MOUTH AT BEDTIME     Not Delegated - Psychiatry: Anxiolytics/Hypnotics 2 Failed - 02/17/2024  5:32 PM      Failed - This refill cannot be delegated      Failed - Urine Drug Screen completed in last 360 days      Passed - Patient is not pregnant      Passed - Valid encounter within last 6 months    Recent Outpatient Visits           1 week ago Annual physical exam   [place] [facility], Leita DEL, MD   4 months ago Essential hypertension   [place] [facility] at [facility], Leita DEL, MD       Future Appointments             In 3 months Justus, Leita DEL, MD [facility] at [facility], [facility]

## 2024-03-04 ENCOUNTER — Other Ambulatory Visit: Payer: Self-pay | Admitting: Internal Medicine

## 2024-03-04 DIAGNOSIS — I10 Essential (primary) hypertension: Secondary | ICD-10-CM

## 2024-03-04 DIAGNOSIS — E039 Hypothyroidism, unspecified: Secondary | ICD-10-CM

## 2024-03-06 NOTE — Telephone Encounter (Signed)
 Requested Prescriptions  Pending Prescriptions Disp Refills   losartan  (COZAAR ) 100 MG tablet [Pharmacy Med Name: Losartan  Potassium 100 MG Oral Tablet] 90 tablet 0    Sig: Take 1 tablet by mouth once daily     Cardiovascular:  Angiotensin Receptor Blockers Passed - 03/06/2024 12:10 PM      Passed - Cr in normal range and within 180 days    Creatinine, Ser  Date Value Ref Range Status  02/09/2024 0.91 0.57 - 1.00 mg/dL Final         Passed - K in normal range and within 180 days    Potassium  Date Value Ref Range Status  02/09/2024 4.8 3.5 - 5.2 mmol/L Final         Passed - Patient is not pregnant      Passed - Last BP in normal range    BP Readings from Last 1 Encounters:  02/09/24 104/70         Passed - Valid encounter within last 6 months    Recent Outpatient Visits           3 weeks ago Annual physical exam   [place] [facility] at [facility], Leita DEL, MD   5 months ago Essential hypertension   [place] [facility], Leita DEL, MD       Future Appointments             In 3 months Justus Leita DEL, MD [facility] at [facility], [facility]             levothyroxine  (SYNTHROID ) 75 MCG tablet [Pharmacy Med Name: Levothyroxine  Sodium 75 MCG Oral Tablet] 90 tablet 0    Sig: TAKE 1 TABLET BY MOUTH ONCE DAILY BEFORE BREAKFAST     Endocrinology:  Hypothyroid Agents Passed - 03/06/2024 12:10 PM      Passed - TSH in normal range and within 360 days    TSH  Date Value Ref Range Status  02/09/2024 2.110 0.450 - 4.500 uIU/mL Final         Passed - Valid encounter within last 12 months    Recent Outpatient Visits           3 weeks ago Annual physical exam   [facility] at [facility], Leita DEL, MD   5 months ago Essential hypertension   [facility] at [facility], Leita DEL, MD       Future Appointments             In 3 months Justus, Leita DEL, MD [facility] at [facility], [facility]

## 2024-03-08 ENCOUNTER — Encounter: Payer: Self-pay | Admitting: *Deleted

## 2024-03-08 ENCOUNTER — Ambulatory Visit
Admission: EM | Admit: 2024-03-08 | Discharge: 2024-03-08 | Disposition: A | Discharge status: Home / Self Care | Attending: Emergency Medicine | Admitting: Emergency Medicine

## 2024-03-08 ENCOUNTER — Ambulatory Visit (INDEPENDENT_AMBULATORY_CARE_PROVIDER_SITE_OTHER)

## 2024-03-08 DIAGNOSIS — W19XXXA Unspecified fall, initial encounter: Secondary | ICD-10-CM

## 2024-03-08 DIAGNOSIS — I83893 Varicose veins of bilateral lower extremities with other complications: Secondary | ICD-10-CM | POA: Diagnosis not present

## 2024-03-08 DIAGNOSIS — R58 Hemorrhage, not elsewhere classified: Secondary | ICD-10-CM

## 2024-03-08 DIAGNOSIS — M79604 Pain in right leg: Secondary | ICD-10-CM | POA: Diagnosis not present

## 2024-03-08 DIAGNOSIS — S99911A Unspecified injury of right ankle, initial encounter: Secondary | ICD-10-CM | POA: Diagnosis not present

## 2024-03-08 DIAGNOSIS — Z043 Encounter for examination and observation following other accident: Secondary | ICD-10-CM | POA: Diagnosis not present

## 2024-03-08 DIAGNOSIS — R609 Edema, unspecified: Secondary | ICD-10-CM | POA: Diagnosis not present

## 2024-03-08 DIAGNOSIS — M1712 Unilateral primary osteoarthritis, left knee: Secondary | ICD-10-CM | POA: Diagnosis not present

## 2024-03-08 DIAGNOSIS — M79605 Pain in left leg: Secondary | ICD-10-CM | POA: Insufficient documentation

## 2024-03-08 NOTE — Discharge Instructions (Addendum)
 Your x-rays were negative for fracture Rest, wear support hose, elevate your legs is much as possible Follow-up with your PCP May take tylenol  as label directed for pain If you have new or worsening issues go to the emergency room for further evaluation

## 2024-03-08 NOTE — ED Triage Notes (Signed)
 Patient states she fell Sat getting out the shower, hit right leg on walker and left knee on floor.  Patient with significant bruising bilateral LE, on blood thinner.  Did not lose consciousness

## 2024-03-08 NOTE — ED Provider Notes (Signed)
 [facility]    CSN: [id number] Arrival date & time: 03/08/24  1602      History   Chief Complaint Chief Complaint  Patient presents with   Fall    HPI Ariana Guzman is a 83 y.o. female.   83 year old female, Ariana Guzman, presents to urgent care for evaluation of right ankle and left knee pain after falling getting out of the shower Saturday 03/03/2024. Pt denies head strike or LOC.  Pt denies striking her head, is on Eloquis.   The history is provided by the patient. No language interpreter was used.    Past Medical History:  Diagnosis Date   Allergies    Arthritis    Asthma    Atrial fibrillation (HCC)    Hypertension    Peripheral arterial disease (HCC)    Sleep apnea    CPAP    Patient Active Problem List   Diagnosis Date Noted   Fall 03/08/2024   Ecchymosis 03/08/2024   Obesity, morbid (HCC) 09/22/2023   Stage 3a chronic kidney disease (HCC) 09/22/2023   Ganglion cyst of finger of right hand 07/12/2022   Primary osteoarthritis of left knee 06/11/2022   Spinal stenosis of lumbar region without neurogenic claudication 06/11/2022   Venous stasis dermatitis of both lower extremities 02/11/2022   Slow transit constipation 12/25/2020   Bursitis of hip 06/05/2020   Acquired thrombophilia (HCC) 06/05/2020   Hyponatremia 04/05/2020   Environmental and seasonal allergies 04/03/2020   Type II diabetes mellitus with complication (HCC) 05/29/2018   Primary insomnia 09/15/2017   Generalized anxiety disorder 09/15/2017   Essential hypertension 09/09/2017   Varicose veins of bilateral lower extremities with other complications 09/09/2017   Bilateral lower extremity edema 09/09/2017   Bilateral leg pain 09/09/2017   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/03/2015   Obstructive sleep apnea 10/07/2015   Asthma, persistent controlled 09/25/2015   Acquired hypothyroidism 02/29/2012   Chronic atrial fibrillation (HCC) 02/29/2012   Mixed  hyperlipidemia 02/29/2012    Past Surgical History:  Procedure Laterality Date   ABDOMINAL HYSTERECTOMY  1991   uterus only- still has cervix - done for prolapse   CATARACT EXTRACTION W/PHACO Left 11/24/2020   Procedure: CATARACT EXTRACTION PHACO AND INTRAOCULAR LENS PLACEMENT (IOC) LEFT 6.99 00:46.1;  Surgeon: Myrna Adine Anes, MD;  Location: [facility];  Service: Ophthalmology;  Laterality: Left;   CATARACT EXTRACTION W/PHACO Right 12/08/2020   Procedure: CATARACT EXTRACTION PHACO AND INTRAOCULAR LENS PLACEMENT (IOC) RIGHT 5.13 00:34.2;  Surgeon: Myrna Adine Anes, MD;  Location: [facility];  Service: Ophthalmology;  Laterality: Right;   COLONOSCOPY  2009   normal   TOE SURGERY      OB History   No obstetric history on file.      Home Medications    Prior to Admission medications   Medication Sig Start Date End Date Taking? Authorizing Provider  albuterol  (PROVENTIL ) (2.5 MG/3ML) 0.083% nebulizer solution Take 2.5 mg by nebulization every 6 (six) hours as needed. 01/11/20   [provider]  albuterol  (VENTOLIN  HFA) 108 (90 Base) MCG/ACT inhaler Inhale 2 puffs into the lungs every 4 (four) hours as needed. 12/07/22   [provider]  ALPRAZolam  (XANAX ) 0.25 MG tablet TAKE 1 TABLET BY MOUTH AT BEDTIME 02/20/24   Berglund, Laura H, MD  Ascorbic Acid (VITAMIN C) 1000 MG tablet Take 1,000 mg by mouth daily.    [provider]  Calcium Carb-Cholecalciferol [phone] MG-UNIT TABS Take by mouth.  [provider]  cetirizine (ZYRTEC) 10 MG tablet Take 10 mg by mouth 2 (two) times daily.    [provider]  cholecalciferol (VITAMIN D) 1000 units tablet Take 1,000 Units by mouth daily.    [provider]  Fluticasone-Umeclidin-Vilant (TRELEGY ELLIPTA) 200-62.5-25 MCG/ACT AEPB Inhale into the lungs. 03/16/22   [provider]  gabapentin (NEURONTIN) 100 MG capsule Take 100 mg by mouth in the morning, at noon, and at  bedtime. 01/20/22 05/01/24  [provider]  ipratropium (ATROVENT ) 0.06 % nasal spray Place 2 sprays into both nostrils 4 (four) times daily. 12/25/21   Arvis Huxley B, PA-C  latanoprost (XALATAN) 0.005 % ophthalmic solution 1 drop at bedtime. 03/30/20   [provider]  levothyroxine  (SYNTHROID ) 75 MCG tablet TAKE 1 TABLET BY MOUTH ONCE DAILY BEFORE BREAKFAST 03/06/24   Berglund, Laura H, MD  losartan  (COZAAR ) 100 MG tablet Take 1 tablet by mouth once daily 03/06/24   Berglund, Laura H, MD  Magnesium 100 MG CAPS Take by mouth.    [provider]  MELATONIN PO Take by mouth.    [provider]  metoprolol  tartrate (LOPRESSOR ) 25 MG tablet TAKE 1 TABLET BY MOUTH ONCE DAILY IN THE MORNING AND 1 AT NOON AND 1 AT BEDTIME 10/18/23   Justus Leita DEL, MD  montelukast  (SINGULAIR ) 10 MG tablet Take 10 mg by mouth daily. 07/06/20   [provider]  Multiple Vitamins-Minerals (OCUVITE EYE HEATLH GUMMIES PO) Take by mouth daily.    [provider]  Rivaroxaban (XARELTO) 15 MG TABS tablet Take 15 mg by mouth daily with supper.     [provider]  spironolactone  (ALDACTONE ) 25 MG tablet Take 1 tablet by mouth twice daily 12/13/23   Justus Leita DEL, MD    Family History Family History  Problem Relation Age of Onset   Breast cancer Other 66   Dementia Mother    Cancer Father        prostate and bone   Heart attack Sister     Social History Social History   Tobacco Use   Smoking status: Former    Current packs/day: 0.00    Average packs/day: 0.5 packs/day for 29.0 years (14.5 ttl pk-yrs)    Types: Cigarettes    Start date: 09/28/1960    Quit date: 09/28/1989    Years since quitting: 34.4   Smokeless tobacco: Never   Tobacco comments:    smoking cessation materials not required  Vaping Use   Vaping status: Never Used  Substance Use Topics   Alcohol use: No   Drug use: No     Allergies   Tramadol and Meloxicam   Review of  Systems Review of Systems  Constitutional:  Negative for fever.  Musculoskeletal:  Positive for arthralgias, gait problem, joint swelling and myalgias.  Skin:  Positive for color change.  All other systems reviewed and are negative.    Physical Exam Triage Vital Signs ED Triage Vitals  Encounter Vitals Group     BP --      Girls Systolic BP Percentile --      Girls Diastolic BP Percentile --      Boys Systolic BP Percentile --      Boys Diastolic BP Percentile --      Pulse --      Resp --      Temp --      Temp src --      SpO2 --  Weight 03/08/24 1631 197 lb (89.4 kg)     Height 03/08/24 1631 5' 6 (1.676 m)     Head Circumference --      Peak Flow --      Pain Score 03/08/24 1630 8     Pain Loc --      Pain Education --      Exclude from Growth Chart --    No data found.  Updated Vital Signs BP (!) 152/59 (BP Location: Left Arm) Comment: checked twice  Pulse 63   Temp 98.2 F (36.8 C) (Oral)   Resp 20   Ht 5' 6 (1.676 m)   Wt 197 lb (89.4 kg)   SpO2 97%   BMI 31.80 kg/m   Visual Acuity Right Eye Distance:   Left Eye Distance:   Bilateral Distance:    Right Eye Near:   Left Eye Near:    Bilateral Near:     Physical Exam Vitals and nursing note reviewed.  Constitutional:      General: She is not in acute distress.    Appearance: She is well-developed and well-groomed.  HENT:     Head: Normocephalic and atraumatic.  Eyes:     Conjunctiva/sclera: Conjunctivae normal.  Cardiovascular:     Rate and Rhythm: Normal rate and regular rhythm.     Pulses:          Dorsalis pedis pulses are 2+ on the right side and 2+ on the left side.     Heart sounds: Normal heart sounds. No murmur heard. Pulmonary:     Effort: Pulmonary effort is normal. No respiratory distress.  Abdominal:     Palpations: Abdomen is soft.     Tenderness: There is no abdominal tenderness.  Musculoskeletal:        General: No swelling.     Cervical back: Neck supple.  Skin:     General: Skin is warm and dry.     Capillary Refill: Capillary refill takes less than 2 seconds.     Findings: Bruising and ecchymosis present.         Comments: Bilateral lower extremities  Neurological:     General: No focal deficit present.     Mental Status: She is alert and oriented to person, place, and time.     GCS: GCS eye subscore is 4. GCS verbal subscore is 5. GCS motor subscore is 6.  Psychiatric:        Attention and Perception: Attention normal.        Mood and Affect: Mood normal.        Speech: Speech normal.        Behavior: Behavior normal. Behavior is cooperative.      UC Treatments / Results  Labs (all labs ordered are listed, but only abnormal results are displayed) Labs Reviewed - No data to display  EKG   Radiology DG Knee Complete 4 Views Left Result Date: 03/08/2024 CLINICAL DATA:  Fall getting out of the shower.  Bruising. EXAM: LEFT KNEE - COMPLETE 4+ VIEW COMPARISON:  None Available. FINDINGS: No fracture or dislocation. Tricompartmental osteoarthritis, greatest in the medial tibiofemoral compartment. No significant joint effusion. No erosive change or focal bone abnormality. Vascular calcifications are seen. IMPRESSION: 1. No fracture or dislocation of the left knee. 2. Tricompartmental osteoarthritis. Electronically Signed   By: Andrea Gasman M.D.   On: 03/08/2024 18:14   DG Ankle Complete Right Result Date: 03/08/2024 CLINICAL DATA:  Fall getting out of the shower.  Bruising. EXAM: RIGHT ANKLE - COMPLETE 3+ VIEW COMPARISON:  None Available. FINDINGS: There is no evidence of fracture, dislocation, or joint effusion. The ankle mortise is preserved. Cerclage wire seen in the distal first metatarsal. There is generalized soft tissue thickening and edema. IMPRESSION: Soft tissue thickening and edema. No fracture or subluxation of the right ankle. Electronically Signed   By: Andrea Gasman M.D.   On: 03/08/2024 18:13    Procedures Procedures  (including critical care time)  Medications Ordered in UC Medications - No data to display  Initial Impression / Assessment and Plan / UC Course  I have reviewed the triage vital signs and the nursing notes.  Pertinent labs & imaging results that were available during my care of the patient were reviewed by me and considered in my medical decision making (see chart for details).    Discussed exam findings and plan of care with patient, strict go to ER precautions given.   Patient verbalized understanding to this provider.  Ddx: Fall,;leg pain, contusion,varicose veins, fracture,dislocation Final Clinical Impressions(s) / UC Diagnoses   Final diagnoses:  Fall, initial encounter  Bilateral leg pain  Varicose veins of bilateral lower extremities with other complications  Ecchymosis     Discharge Instructions      Your x-rays were negative for fracture Rest, wear support hose, elevate your legs is much as possible Follow-up with your PCP May take tylenol  as label directed for pain If you have new or worsening issues go to the emergency room for further evaluation     ED Prescriptions   None    PDMP not reviewed this encounter.   Aminta Loose, NP 03/08/24 [phone]

## 2024-03-12 ENCOUNTER — Other Ambulatory Visit: Payer: Self-pay | Admitting: Internal Medicine

## 2024-03-12 DIAGNOSIS — I10 Essential (primary) hypertension: Secondary | ICD-10-CM

## 2024-03-13 ENCOUNTER — Ambulatory Visit: Payer: Self-pay | Admitting: *Deleted

## 2024-03-13 DIAGNOSIS — S9001XA Contusion of right ankle, initial encounter: Secondary | ICD-10-CM | POA: Diagnosis not present

## 2024-03-13 DIAGNOSIS — M79672 Pain in left foot: Secondary | ICD-10-CM | POA: Diagnosis not present

## 2024-03-13 DIAGNOSIS — I872 Venous insufficiency (chronic) (peripheral): Secondary | ICD-10-CM | POA: Diagnosis not present

## 2024-03-13 DIAGNOSIS — S9002XA Contusion of left ankle, initial encounter: Secondary | ICD-10-CM | POA: Diagnosis not present

## 2024-03-13 DIAGNOSIS — L851 Acquired keratosis [keratoderma] palmaris et plantaris: Secondary | ICD-10-CM | POA: Diagnosis not present

## 2024-03-13 DIAGNOSIS — M79671 Pain in right foot: Secondary | ICD-10-CM | POA: Diagnosis not present

## 2024-03-13 DIAGNOSIS — I890 Lymphedema, not elsewhere classified: Secondary | ICD-10-CM | POA: Diagnosis not present

## 2024-03-13 LAB — HM DIABETES FOOT EXAM

## 2024-03-13 NOTE — Telephone Encounter (Signed)
 FYI Only or Action Required?: FYI only for provider.  Patient was last seen in primary care on 02/09/2024 by Justus Leita DEL, MD.  Called Nurse Triage reporting Leg Injury.  Symptoms began several days ago.  Interventions attempted: OTC medications: tylenol , ice.  Symptoms are: gradually worsening.  Triage Disposition: See Physician Within 24 Hours  Patient/caregiver understands and will follow disposition?: Yes- declines appointment within 24 hours due to transportation- scheduled within 48 hours Reason for Disposition  [1] MODERATE pain (e.g., interferes with normal activities, limping) AND [2] high-risk adult (e.g., age > 60 years, osteoporosis, chronic steroid use)  Answer Assessment - Initial Assessment Questions 1. MECHANISM: How did the injury happen? (e.g., twisting injury, direct blow)      Fell in bathroom- landed on hands and leg landed on the walker 2. ONSET: When did the injury happen? (e.g., minutes, hours ago)      Thursday- patient was seen at [facility] and Xray confirmed no broken bones 3. LOCATION: Where is the injury located?      R leg- ankle up- swelling with bruise, left leg- knee bruised with raw spot 4. APPEARANCE of INJURY: What does the injury look like?  (e.g., deformity of leg)     Swelling and bruises 5. SEVERITY: Can you put weight on that leg? Can you walk?      No, walking only with assistance- using wheelchair 6. SIZE: For cuts, bruises, or swelling, ask: How large is it? (e.g., inches or centimeters)      Bruising- left- side on knee to the crease on front, right leg- back of leg and heel-arch to toes 7. PAIN: Is there pain? If Yes, ask: How bad is the pain?   What does it keep you from doing? (Scale 0-10; or none, mild, moderate, severe)     Yes, walking, 8/10 8. TETANUS: For any breaks in the skin, ask: When was your last tetanus booster?     no 9. OTHER SYMPTOMS: Do you have any other symptoms?      Hand is having some  numbness- left  Ariana Guzman(DRP) calling to schedule follow up appointment for fall.  Patient declines appointment within disposition- she has transportation to office on [address] and requesting appointment then. Patient scheduled with instruction- call back if increased symptoms.  Protocols used: Leg Injury-A-AH   Copied from CRM [id number]. Topic: Clinical - Red Word Triage >> Mar 13, 2024  3:45 PM Avram MATSU wrote: Red Word that prompted transfer to Nurse Triage: pt hit her right leg and its bruise. She fell recently and has red spots on her leg. Swelling/painful

## 2024-03-13 NOTE — Telephone Encounter (Signed)
 Requested Prescriptions  Pending Prescriptions Disp Refills   metoprolol  tartrate (LOPRESSOR ) 25 MG tablet [Pharmacy Med Name: METOPROLOL  TARTRATE 25MG  TAB] 270 tablet 0    Sig: TAKE 1 TABLET BY MOUTH ONCE DAILY IN THE MORNING, 1 TABLET AT NOON AND 1 TABLET AT BEDTIME     Cardiovascular:  Beta Blockers Failed - 03/13/2024  3:12 PM      Failed - Last BP in normal range    BP Readings from Last 1 Encounters:  03/08/24 (!) 152/59         Passed - Last Heart Rate in normal range    Pulse Readings from Last 1 Encounters:  03/08/24 63         Passed - Valid encounter within last 6 months    Recent Outpatient Visits           1 month ago Annual physical exam   [place] [facility] at [facility], Leita DEL, MD   5 months ago Essential hypertension   [place] [facility] at [facility], Leita DEL, MD       Future Appointments             In 3 months Justus Leita DEL, MD [facility] at [facility], [facility]             spironolactone  (ALDACTONE ) 25 MG tablet [Pharmacy Med Name: Spironolactone  25 MG Oral Tablet] 180 tablet 0    Sig: Take 1 tablet by mouth twice daily     Cardiovascular: Diuretics - Aldosterone Antagonist Failed - 03/13/2024  3:12 PM      Failed - Na in normal range and within 180 days    Sodium  Date Value Ref Range Status  02/09/2024 133 (L) 134 - 144 mmol/L Final         Failed - Last BP in normal range    BP Readings from Last 1 Encounters:  03/08/24 (!) 152/59         Passed - Cr in normal range and within 180 days    Creatinine, Ser  Date Value Ref Range Status  02/09/2024 0.91 0.57 - 1.00 mg/dL Final         Passed - K in normal range and within 180 days    Potassium  Date Value Ref Range Status  02/09/2024 4.8 3.5 - 5.2 mmol/L Final         Passed - eGFR is 30 or above and within 180 days    GFR calc Af Amer  Date Value Ref Range Status   08/14/2020 72 >59 mL/min/1.73 Final    Comment:    **In accordance with recommendations from the NKF-ASN Task force,**   [facility] is in the process of updating its eGFR calculation to the   2021 CKD-EPI creatinine equation that estimates kidney function   without a race variable.    GFR, Estimated  Date Value Ref Range Status  06/05/2020 >60 >60 mL/min Final    Comment:    (NOTE) Calculated using the CKD-EPI Creatinine Equation (2021)    GFR calc non Af Amer  Date Value Ref Range Status  08/14/2020 63 >59 mL/min/1.73 Final   eGFR  Date Value Ref Range Status  02/09/2024 62 >59 mL/min/1.73 Final         Passed - Valid encounter within last 6 months    Recent Outpatient Visits  1 month ago Annual physical exam   [facility] at [facility], Leita DEL, MD   5 months ago Essential hypertension   [facility] at [facility], Leita DEL, MD       Future Appointments             In 3 months Justus, Leita DEL, MD [facility] at [facility], [facility]

## 2024-03-15 ENCOUNTER — Ambulatory Visit

## 2024-03-15 ENCOUNTER — Ambulatory Visit (INDEPENDENT_AMBULATORY_CARE_PROVIDER_SITE_OTHER): Admitting: Family Medicine

## 2024-03-15 ENCOUNTER — Encounter: Payer: Self-pay | Admitting: Family Medicine

## 2024-03-15 VITALS — BP 120/70 | HR 57 | Ht 66.0 in | Wt 197.0 lb

## 2024-03-15 DIAGNOSIS — S93421A Sprain of deltoid ligament of right ankle, initial encounter: Secondary | ICD-10-CM | POA: Diagnosis not present

## 2024-03-15 DIAGNOSIS — S86011A Strain of right Achilles tendon, initial encounter: Secondary | ICD-10-CM | POA: Diagnosis not present

## 2024-03-15 NOTE — Assessment & Plan Note (Signed)
 History of Present Illness Ariana Guzman is an 83 year old female with spinal stenosis who presents with a fall resulting in leg pain and bruising. She is accompanied by her daughter-in-law, Dickey.  Fall and resultant lower extremity trauma - On March 03, 2024, experienced a fall while stepping out of the shower, tripping and falling forward onto her hands and knee, with her leg striking the walker - No loss of consciousness - Significant bruising and pain localized to the back of the heel and leg - Severe, sharp pain in the back of the leg, exacerbated by pressure (e.g., resting on a pillow) and most intense in the morning upon first standing - Tylenol  and ice provide partial pain relief - No new numbness or tingling in the legs since the fall  Chronic neuropathic and musculoskeletal symptoms - History of spinal stenosis with chronic balance impairment, requiring walker for ambulation - Chronic sciatic nerve pain, currently exacerbated - 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 100 mg twice daily, prescribed by podiatrist  Functional status and mobility - Lives independently but requires family assistance for certain tasks due to mobility limitations  Physical Exam Right Ankle Exam INSPECTION: Prominent swelling, erythema, and ecchymosis extending from the inferior gastrocnemius to the midfoot arch, Prominent ecchymosis at the medial aspect of the knee just inferior to the medial joint line PALPATION: Mild tenderness at the lateral malleolus, ATFL, and fibular head; Maximum tenderness at the PTFL and along the Achilles; Significant tenderness at the deltoid ligament; Non-tender at medial malleolus and peroneal tendon insertion; Tender at gastrocnemius and Achilles origin RANGE OF MOTION (ROM) ASSESSMENT: Limited by pain with dorsiflexion MUSCLE STRENGTH TESTING: 5/5 strength with painful resisted plantar flexion; Resisted inversion and eversion are benign NEUROMUSCULAR: Grossly  intact SPECIAL TESTS: Thompson's test normal  Results RADIOLOGY Right ankle X-ray: No acute osseous changes, significant medial soft tissue swelling, mortise maintained, calcaneal spur (03/08/2024)  Assessment and Plan Right ankle and Achilles complex sprain/strain with contusion Mechanism likely involved forced hyper-dorsiflexion with eversion, resulting in a primary strain of the Achilles tendon, secondary deltoid ligament involvement, and tertiary strain of the gastrocnemius and lateral ankle stabilizers. No clinical evidence of Achilles tendon rupture. Significant ecchymosis likely exacerbated by anticoagulation with Xarelto. No signs of fracture or complete ligamentous disruption. - Short walking boot for immobilization and protection during acute phase - Ankle brace for transitional support as tolerated - Recommend walker use during ambulation to reduce fall risk - Elevate lower extremity above heart level when resting to minimize edema - Ice 15-20 minutes every 2-3 hours for inflammation control - Avoid NSAIDs due to anticoagulation; use acetaminophen  for pain management - Initiate physical therapy in 2 weeks for progressive ROM and strength restoration - Re-evaluate in 6 weeks to assess recovery, stability, and function

## 2024-03-15 NOTE — Assessment & Plan Note (Signed)
 See additional assessment(s) for plan details.

## 2024-03-15 NOTE — Progress Notes (Signed)
 Primary Care / Sports Medicine Office Visit  Patient Information:  Patient ID: Ariana Guzman, female DOB: Sep 07, 1940 Age: 83 y.o. MRN: [id number]   Ariana Guzman is a pleasant 83 y.o. female presenting with the following:  Chief Complaint  Patient presents with   Knee Pain    Left knee pain since fall on 03/03/24. Patient knee has swelling and bruised. Patient taking tylenol  for pain. Patient has xray's of knee.   Ankle Pain    Right ankle pain since fall on 03/03/24. Bruised, tender to touch , and painful. Difficulty bearing weight. Most pain is in the posterior aspect of ankle. Patient has xray's of ankle.     Vitals:   03/15/24 1051  BP: 120/70  Pulse: (!) 57  SpO2: 97%   Vitals:   03/15/24 1051  Weight: 197 lb (89.4 kg)  Height: 5' 6 (1.676 m)   Body mass index is 31.8 kg/m.  DG Knee Complete 4 Views Left Result Date: 03/08/2024 CLINICAL DATA:  Fall getting out of the shower.  Bruising. EXAM: LEFT KNEE - COMPLETE 4+ VIEW COMPARISON:  None Available. FINDINGS: No fracture or dislocation. Tricompartmental osteoarthritis, greatest in the medial tibiofemoral compartment. No significant joint effusion. No erosive change or focal bone abnormality. Vascular calcifications are seen. IMPRESSION: 1. No fracture or dislocation of the left knee. 2. Tricompartmental osteoarthritis. Electronically Signed   By: Andrea Gasman M.D.   On: 03/08/2024 18:14   DG Ankle Complete Right Result Date: 03/08/2024 CLINICAL DATA:  Fall getting out of the shower.  Bruising. EXAM: RIGHT ANKLE - COMPLETE 3+ VIEW COMPARISON:  None Available. FINDINGS: There is no evidence of fracture, dislocation, or joint effusion. The ankle mortise is preserved. Cerclage wire seen in the distal first metatarsal. There is generalized soft tissue thickening and edema. IMPRESSION: Soft tissue thickening and edema. No fracture or subluxation of the right ankle. Electronically Signed   By: Andrea Gasman M.D.   On:  03/08/2024 18:13     Independent interpretation of notes and tests performed by another provider:   See below for details, independently reviewed images with patient and family, questions answered.  Procedures performed:   None  Pertinent History, Exam, Impression, and Recommendations:   Problem List Items Addressed This Visit     Sprain of deltoid ligament of right ankle   See additional assessment(s) for plan details.      Relevant Orders   Ambulatory referral to Physical Therapy   Strain of right Achilles tendon - Primary   History of Present Illness Ariana Guzman is an 83 year old female with spinal stenosis who presents with a fall resulting in leg pain and bruising. She is accompanied by her daughter-in-law, [place].  Fall and resultant lower extremity trauma - On March 03, 2024, experienced a fall while stepping out of the shower, tripping and falling forward onto her hands and knee, with her leg striking the walker - No loss of consciousness - Significant bruising and pain localized to the back of the heel and leg - Severe, sharp pain in the back of the leg, exacerbated by pressure (e.g., resting on a pillow) and most intense in the morning upon first standing - Tylenol  and ice provide partial pain relief - No new numbness or tingling in the legs since the fall  Chronic neuropathic and musculoskeletal symptoms - History of spinal stenosis with chronic balance impairment, requiring walker for ambulation - Chronic sciatic nerve pain, currently exacerbated - 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 100 mg twice daily, prescribed by podiatrist  Functional status and mobility - Lives independently but requires family assistance for certain tasks due to mobility limitations  Physical Exam Right Ankle Exam INSPECTION: Prominent swelling, erythema, and ecchymosis extending from the inferior gastrocnemius to the midfoot arch, Prominent ecchymosis at the medial aspect of  the knee just inferior to the medial joint line PALPATION: Mild tenderness at the lateral malleolus, ATFL, and fibular head; Maximum tenderness at the PTFL and along the Achilles; Significant tenderness at the deltoid ligament; Non-tender at medial malleolus and peroneal tendon insertion; Tender at gastrocnemius and Achilles origin RANGE OF MOTION (ROM) ASSESSMENT: Limited by pain with dorsiflexion MUSCLE STRENGTH TESTING: 5/5 strength with painful resisted plantar flexion; Resisted inversion and eversion are benign NEUROMUSCULAR: Grossly intact SPECIAL TESTS: Thompson's test normal  Results RADIOLOGY Right ankle X-ray: No acute osseous changes, significant medial soft tissue swelling, mortise maintained, calcaneal spur (03/08/2024)  Assessment and Plan Right ankle and Achilles complex sprain/strain with contusion Mechanism likely involved forced hyper-dorsiflexion with eversion, resulting in a primary strain of the Achilles tendon, secondary deltoid ligament involvement, and tertiary strain of the gastrocnemius and lateral ankle stabilizers. No clinical evidence of Achilles tendon rupture. Significant ecchymosis likely exacerbated by anticoagulation with Xarelto. No signs of fracture or complete ligamentous disruption. - Short walking boot for immobilization and protection during acute phase - Ankle brace for transitional support as tolerated - Recommend walker use during ambulation to reduce fall risk - Elevate lower extremity above heart level when resting to minimize edema - Ice 15-20 minutes every 2-3 hours for inflammation control - Avoid NSAIDs due to anticoagulation; use acetaminophen  for pain management - Initiate physical therapy in 2 weeks for progressive ROM and strength restoration - Re-evaluate in 6 weeks to assess recovery, stability, and function      Relevant Orders   Ambulatory referral to Physical Therapy   A total of 48 minutes was spent on the date of service,  03/15/2024, encompassing both face-to-face and non-face-to-face time. This included review of prior records and imaging (e.g., MRI and/or radiographs), medical chart review, information gathering, documentation, care coordination with clinic staff, discussion and counseling with the patient regarding clinical findings and treatment options, and planning for follow-up and next steps in management.   Orders & Medications Medications: No orders of the defined types were placed in this encounter.  Orders Placed This Encounter  Procedures   Ambulatory referral to Physical Therapy     No follow-ups on file.     Selinda JINNY Ku, MD, [facility]   Primary Care Sports Medicine Primary Care and Sports Medicine at [facility]

## 2024-03-15 NOTE — Patient Instructions (Signed)
 Patient Plan for Post-Visit Guidance  - Wear a short walking boot to protect and immobilize your ankle during the initial healing phase. - Use a walker when walking to reduce your risk of falling. - Elevate your leg above heart level when resting to help reduce swelling. - Apply ice to your ankle for 15-20 minutes every 2-3 hours to control inflammation. - Do not use NSAIDs. Use acetaminophen  for pain relief as needed. - Can trial over-the-counter topical pain creams (lidocaine , menthol, camphor) as tolerated, watch for skin irritation - Begin physical therapy in 2 weeks to restore ankle movement and strength. - Return for a follow-up visit in 6 weeks to check your recovery and ankle stability.  Red Flags: - If you notice increasing pain, severe swelling, numbness, tingling, inability to move your foot, signs of infection (such as redness, warmth, or fever), or any new or worsening symptoms, contact the office immediately.

## 2024-03-19 ENCOUNTER — Telehealth: Payer: Self-pay | Admitting: Internal Medicine

## 2024-03-19 NOTE — Telephone Encounter (Signed)
 Copied from CRM [id number]. Topic: General - Call Back - No Documentation >> Mar 19, 2024 11:44 AM Avram MATSU wrote: Reason for CRM: pt would like a callback from Ariana Guzman. She would like to talk about her health with the nurse. She stated she would appreciate the call back [phone]

## 2024-03-19 NOTE — Telephone Encounter (Signed)
 Patient said she is having a cough with yellow production. Wants to wait til tomorrow to see if better. If not, patient will call tomorrow to schedule acute appt with another provider.   - Ariana Pelot M.

## 2024-03-20 ENCOUNTER — Ambulatory Visit (INDEPENDENT_AMBULATORY_CARE_PROVIDER_SITE_OTHER): Admitting: Family Medicine

## 2024-03-20 ENCOUNTER — Telehealth: Payer: Self-pay

## 2024-03-20 VITALS — BP 118/70 | HR 66 | Ht 66.0 in

## 2024-03-20 DIAGNOSIS — J00 Acute nasopharyngitis [common cold]: Secondary | ICD-10-CM | POA: Diagnosis not present

## 2024-03-20 MED ORDER — FLUTICASONE PROPIONATE 50 MCG/ACT NA SUSP
2.0000 | Freq: Every day | NASAL | 0 refills | Status: DC
Start: 2024-03-20 — End: 2024-06-14

## 2024-03-20 MED ORDER — BENZONATATE 100 MG PO CAPS: 100.0000 mg | ORAL_CAPSULE | Freq: Three times a day (TID) | ORAL | 0 refills | Status: DC | PRN

## 2024-03-20 MED ORDER — AZELASTINE HCL 0.1 % NA SOLN
2.0000 | Freq: Two times a day (BID) | NASAL | 0 refills | Status: DC
Start: 2024-03-20 — End: 2024-06-14

## 2024-03-20 NOTE — Telephone Encounter (Signed)
 Copied from CRM #[id number]. Topic: Clinical - Medical Advice >> Mar 20, 2024 12:12 PM Everette C wrote: Reason for CRM: The patient has called to request direct contact with C. McAdoo when possible to continue ongoing discussions from yesterday 03/19/24 Please contact further when possible

## 2024-03-20 NOTE — Telephone Encounter (Signed)
 Scheduled

## 2024-03-22 ENCOUNTER — Other Ambulatory Visit: Payer: Self-pay

## 2024-03-22 ENCOUNTER — Telehealth: Payer: Self-pay

## 2024-03-22 DIAGNOSIS — J45998 Other asthma: Secondary | ICD-10-CM

## 2024-03-22 MED ORDER — ALBUTEROL SULFATE (2.5 MG/3ML) 0.083% IN NEBU: 2.5000 mg | INHALATION_SOLUTION | Freq: Four times a day (QID) | RESPIRATORY_TRACT | 0 refills | Status: AC | PRN

## 2024-03-22 MED ORDER — ALBUTEROL SULFATE HFA 108 (90 BASE) MCG/ACT IN AERS: 2.0000 | INHALATION_SPRAY | RESPIRATORY_TRACT | 0 refills | Status: AC | PRN

## 2024-03-22 MED ORDER — AZITHROMYCIN 250 MG PO TABS: ORAL_TABLET | ORAL | 0 refills | Status: AC

## 2024-03-22 NOTE — Telephone Encounter (Signed)
 Copied from CRM [id number]. Topic: Clinical - Medical Advice >> Mar 22, 2024  1:53 PM DeAngela L wrote: Reason for CRM: patient calling to inform Dr Alvia that she has been taking the meds prescribed and she is still having the same concerns and she needs to call back, the patient thinks she has a sinus infection she can feel the pressure above her eyebrow, and her nose is clear but she's up at night coughing and not coughing in the day and coughing up mucus that is still yellow  Patient has an inhaler and she wants to ask if Dr Alvia could call her in a prescription or antibiotic  And she also has a nebulizer refill that she use and she has no fever   Pt num [phone] (M)

## 2024-03-22 NOTE — Telephone Encounter (Signed)
 Completed. JM

## 2024-03-26 NOTE — Progress Notes (Signed)
 Primary Care / Sports Medicine Office Visit  Patient Information:  Patient ID: Ariana Guzman, female DOB: 1941-05-31 Age: 83 y.o. MRN: [id number]   Ariana Guzman is a pleasant 83 y.o. female presenting with the following:  Chief Complaint  Patient presents with   Cough    Cough since Sunday 03/18/24. Patient started coughing up mucus this morning. She has taken Mucinex and Robitussin since Sunday which has helped loosen up the mucus. She has not taken anything today.    Vitals:   03/20/24 1633  BP: 118/70  Pulse: 66  SpO2: 98%   Vitals:   03/20/24 1633  Height: 5' 6 (1.676 m)   Body mass index is 31.8 kg/m.     Independent interpretation of notes and tests performed by another provider:   None  Procedures performed:   None  Pertinent History, Exam, Impression, and Recommendations:   Problem List Items Addressed This Visit     Acute rhinitis - Primary   History of Present Illness Ariana Guzman is an 83 year old female who presents with a persistent cough and difficulty breathing. She is accompanied by her son, Ariana Guzman.  Cough and respiratory symptoms - Persistent productive cough since Sunday - Initially produced yellow mucus; currently mucus is lighter in color but still present - Cough is wet in quality - No facial pressure, ear pain, or throat pain - Chest tightness and difficulty with deep inhalation - Occasional wheezing - No use of nebulizer or inhaler during this episode, but carries inhaler in pocketbook - Current medications for respiratory issues include montelukast  (Singulair ) and Trelegy - Mucinex used with improvement in thinning secretions - History of evaluation by a lung specialist  Physical Exam VITALS: SaO2- 98% HEENT: Oroharynx normal. Nasal mucosa erythematous. TMs normal. No sinus tenderness. NECK: No cervical adenopathy. CHEST: Lungs clear to auscultation bilaterally. CARDIOVASCULAR: Regular rate and rhythm.  Acute cough with  postnasal drip and rhinitis Differential includes viral infection, allergies, or bacterial infection. Discussed risks of antibiotics and emphasized addressing nasal inflammation to prevent bacterial sinus infection. - Start Flonase , two sprays each nostril daily for seven days. - Start Astelin , one spray each nostril twice daily for seven days. - Continue Mucinex 12-hour formulation, twice daily for seven days. - Prescribe Tessalon  Perles for cough as needed, especially at night. - Advise to drink plenty of water to aid Mucinex effectiveness. - Contact by Friday if symptoms do not improve.        Orders & Medications Medications:  Meds ordered this encounter  Medications   azelastine  (ASTELIN ) 0.1 % nasal spray    Sig: Place 2 sprays into both nostrils 2 (two) times daily. Use in each nostril as directed    Dispense:  30 mL    Refill:  0    Use generic Astelin    fluticasone  (FLONASE ) 50 MCG/ACT nasal spray    Sig: Place 2 sprays into both nostrils daily.    Dispense:  16 g    Refill:  0   benzonatate  (TESSALON  PERLES) 100 MG capsule    Sig: Take 1 capsule (100 mg total) by mouth 3 (three) times daily as needed for cough.    Dispense:  20 capsule    Refill:  0   No orders of the defined types were placed in this encounter.    No follow-ups on file.     Ariana JINNY Ku, MD, [facility]   Primary Care Sports Medicine Primary Care and Sports Medicine  at [facility]

## 2024-03-26 NOTE — Patient Instructions (Signed)
 Patient Plan for Post-Visit Guidance  - Use Flonase : two sprays in each nostril daily for seven days. - Use Astelin : one spray in each nostril twice daily for seven days. - Continue Mucinex 12-hour: take twice daily for seven days. - Take Tessalon  Perles as needed for cough, especially at night. - Drink plenty of water to help with mucus clearance. - Contact the office by Friday if symptoms do not improve.  Red Flags - If you develop high fever, shortness of breath, chest pain, severe headache, or worsening symptoms, seek medical attention promptly.

## 2024-03-26 NOTE — Assessment & Plan Note (Signed)
 History of Present Illness ZEPPELIN BECKSTRAND is an 83 year old female who presents with a persistent cough and difficulty breathing. She is accompanied by her son, [place].  Cough and respiratory symptoms - Persistent productive cough since Sunday - Initially produced yellow mucus; currently mucus is lighter in color but still present - Cough is wet in quality - No facial pressure, ear pain, or throat pain - Chest tightness and difficulty with deep inhalation - Occasional wheezing - No use of nebulizer or inhaler during this episode, but carries inhaler in pocketbook - Current medications for respiratory issues include montelukast  (Singulair ) and Trelegy - Mucinex used with improvement in thinning secretions - History of evaluation by a lung specialist  Physical Exam VITALS: SaO2- 98% HEENT: Oroharynx normal. Nasal mucosa erythematous. TMs normal. No sinus tenderness. NECK: No cervical adenopathy. CHEST: Lungs clear to auscultation bilaterally. CARDIOVASCULAR: Regular rate and rhythm.  Acute cough with postnasal drip and rhinitis Differential includes viral infection, allergies, or bacterial infection. Discussed risks of antibiotics and emphasized addressing nasal inflammation to prevent bacterial sinus infection. - Start Flonase , two sprays each nostril daily for seven days. - Start Astelin , one spray each nostril twice daily for seven days. - Continue Mucinex 12-hour formulation, twice daily for seven days. - Prescribe Tessalon  Perles for cough as needed, especially at night. - Advise to drink plenty of water to aid Mucinex effectiveness. - Contact by Friday if symptoms do not improve.

## 2024-04-12 ENCOUNTER — Telehealth: Payer: Self-pay

## 2024-04-12 NOTE — Telephone Encounter (Signed)
 APPOINTMENT CHANGED TO EARLIER DATE.

## 2024-04-12 NOTE — Telephone Encounter (Signed)
 We cannot place the referral before she is seen for the ortho visit with Dr Alvia. Can you call the patient and see if we can move her Ortho visit up with Dr Alvia?  Thanks!

## 2024-04-12 NOTE — Telephone Encounter (Signed)
 Copied from CRM [id number]. Topic: Referral - Question >> Apr 12, 2024  3:35 PM Sophia H wrote:  Reason for CRM: Patient is requesting to speak with nurse regarding a referral to PT for sciatic nerves in her leg. States she has started to have pain in her other leg now. Wanting to know if this can be submitted prior to her appt on 09/11.  [phone]

## 2024-04-19 ENCOUNTER — Ambulatory Visit
Admission: RE | Admit: 2024-04-19 | Discharge: 2024-04-19 | Disposition: A | Discharge status: Home / Self Care | Source: Ambulatory Visit | Attending: Internal Medicine | Admitting: Internal Medicine

## 2024-04-19 ENCOUNTER — Encounter: Payer: Self-pay | Admitting: Family Medicine

## 2024-04-19 ENCOUNTER — Ambulatory Visit: Admitting: Family Medicine

## 2024-04-19 VITALS — BP 140/80 | HR 66 | Ht 66.0 in | Wt 204.6 lb

## 2024-04-19 DIAGNOSIS — M7632 Iliotibial band syndrome, left leg: Secondary | ICD-10-CM

## 2024-04-19 DIAGNOSIS — S93421D Sprain of deltoid ligament of right ankle, subsequent encounter: Secondary | ICD-10-CM | POA: Diagnosis not present

## 2024-04-19 DIAGNOSIS — S86011D Strain of right Achilles tendon, subsequent encounter: Secondary | ICD-10-CM

## 2024-04-19 DIAGNOSIS — M1712 Unilateral primary osteoarthritis, left knee: Secondary | ICD-10-CM | POA: Diagnosis not present

## 2024-04-19 DIAGNOSIS — Z1231 Encounter for screening mammogram for malignant neoplasm of breast: Secondary | ICD-10-CM | POA: Insufficient documentation

## 2024-04-19 NOTE — Patient Instructions (Signed)
 Patient Plan for Post-Visit Guidance  Left Knee Osteoarthritis and IT Band Syndrome  - Attend physical therapy at [facility] for knee and IT band rehabilitation. - Consider a cortisone injection if pain interferes with physical therapy. - Discuss gel injections if cortisone is needed frequently. - Monitor for worsening pain or new symptoms and contact the office if these occur.  Right Ankle Deltoid Sprain and Achilles Strain  - Start physical therapy at [facility] to improve walking ability and prevent falls.  Red Flags  - Seek medical attention if you experience severe or sudden knee or ankle pain, swelling, redness, warmth, inability to bear weight, falls, or any new or worsening symptoms.

## 2024-04-19 NOTE — Progress Notes (Signed)
 Primary Care / Sports Medicine Office Visit  Patient Information:  Patient ID: Ariana Guzman, female DOB: [birth year] Age: 83 y.o. MRN: [id number]   Ariana Guzman is a pleasant 83 y.o. female presenting with the following:  Chief Complaint  Patient presents with   Ankle Pain    Patient presents today for a f/u on her right ankle. Her right ankle is doing better but she needs order for therapy.    Vitals:   04/19/24 0843  BP: (!) 140/80  Pulse: 66  SpO2: 97%   Vitals:   04/19/24 0843  Weight: 204 lb 9.6 oz (92.8 kg)  Height: 5' 6 (1.676 m)   Body mass index is 33.02 kg/m.  No results found.   Independent interpretation of notes and tests performed by another provider:   RADIOLOGY Knee X-ray: Tricompartmental arthritis with osteophytes, joint space narrowing, and instability. Severe arthritis noted. (03/08/2024), reviewed images with patient today in office.  Procedures performed:   None  Pertinent History, Exam, Impression, and Recommendations:   Problem List Items Addressed This Visit     It band syndrome, left   Left leg and thigh pain - Pain began approximately five weeks ago following a fall - Pain localized to the left outer leg and sometimes up to the lateral hip, with the most severe pain on the outer hip and lateral aspect of the leg - Does not feel like patient's reported prior right-sided sciatica episodes and she has not experienced sciatica on the left previously  LEFT KNEE INSPECTION: No swelling, erythema, or deformity PALPATION: Tenderness at the medial joint line; maximal tenderness at the posterolateral aspect of the lateral joint line; quadriceps and patellar tendons nontender RANGE OF MOTION: Limited active and passive ROM with pain, no overt mechanical obstruction SPECIAL TESTS: Valgus and varus stressing negative; no ligamentous laxity anteriorly or posteriorly  LEFT IT BAND PALPATION: Tenderness throughout the mid to distal iliotibial  band; nontender at the greater trochanter  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 Severe arthritis on x-ray. Pain at posterolateral lateral joint line and IT band tenderness. Previous cortisone injections effective but concerns about bone health. Discussed gel injections as alternative. Emphasized cortisone's temporary relief role to aid physical therapy. Physical therapy primary for stability and function. - Refer to [name] for knee and IT band rehabilitation. - Consider cortisone injection if pain interferes with physical therapy. - Discuss gel injections if cortisone is needed frequently. - Monitor for worsening symptoms and contact provider if pain increases.      Relevant Orders   Ambulatory referral to Physical Therapy   Primary osteoarthritis of left knee (Chronic)   Knee pain and mechanical symptoms - History of knee pain with prior imaging showing arthritis; July 2025 x-ray demonstrated 'bone on bone' changes and loss of joint space - Sensation of popping in the knee when standing - Feels unsteady and at risk of falling if not holding onto something - Previous cortisone injections provided relief, but she is cautious about frequent use due to concerns about bone health  LEFT KNEE INSPECTION: No swelling, erythema, or deformity PALPATION: Tenderness at the medial joint line; maximal tenderness at the posterolateral aspect of the lateral joint line; quadriceps and patellar tendons nontender RANGE OF MOTION: Limited active and passive ROM with pain, no overt mechanical obstruction SPECIAL TESTS: Valgus and varus stressing negative; no ligamentous laxity anteriorly or posteriorly  Left knee  osteoarthritis with left iliotibial band syndrome Chronic tricompartmental osteoarthritis with bone spurs, exacerbated by fall. Severe arthritis on x-ray. Pain at posterolateral  lateral joint line and IT band tenderness. Previous cortisone injections effective but concerns about bone health. Discussed gel injections as alternative. Emphasized cortisone's temporary relief role to aid physical therapy. Physical therapy primary for stability and function. - Refer to [name] for knee and IT band rehabilitation. - Consider cortisone injection if pain interferes with physical therapy. - Discuss gel injections if cortisone is needed frequently. - Monitor for worsening symptoms and contact provider if pain increases.      Relevant Orders   Ambulatory referral to Physical Therapy   Sprain of deltoid ligament of right ankle   Ankle pain - Ankle pain has improved, allowing for good mobility without significant discomfort during daily activities such as getting out of bed or going down stairs  Functional status and physical therapy - Unable to attend physical therapy recently due to family illnesses - Desires to resume physical therapy to improve walking ability  RIGHT ANKLE INSPECTION: No swelling, erythema, or deformity PALPATION: Symmetric tenderness along the gastrocnemius-Achilles complex bilaterally; right Achilles insertion nontender; no tenderness throughout the deltoid ligament RANGE OF MOTION: Full active and passive ROM without pain SPECIAL TESTS: Anterior drawer and talar tilt benign        Relevant Orders   Ambulatory referral to Physical Therapy   Strain of right Achilles tendon - Primary   Ankle pain - Ankle pain has improved, allowing for good mobility without significant discomfort during daily activities such as getting out of bed or going down stairs  Functional status and physical therapy - Unable to attend physical therapy recently due to family illnesses - Desires to resume physical therapy to improve walking ability  Right Achilles strain and deltoid sprain Total resolution of symptoms, examination benign.  Would benefit  from physical therapy to address deconditioning and in an effort to aid in fall prevention. - Refer to [name] for knee and IT band rehabilitation      Relevant Orders   Ambulatory referral to Physical Therapy     Orders & Medications Medications: No orders of the defined types were placed in this encounter.  Orders Placed This Encounter  Procedures   Ambulatory referral to Physical Therapy     No follow-ups on file.     Selinda JINNY Ku, MD, [facility]   Primary Care Sports Medicine Primary Care and Sports Medicine at [facility]

## 2024-04-19 NOTE — Assessment & Plan Note (Signed)
 Ankle pain - Ankle pain has improved, allowing for good mobility without significant discomfort during daily activities such as getting out of bed or going down stairs  Functional status and physical therapy - Unable to attend physical therapy recently due to family illnesses - Desires to resume physical therapy to improve walking ability  Right Achilles strain and deltoid sprain Total resolution of symptoms, examination benign.  Would benefit from physical therapy to address deconditioning and in an effort to aid in fall prevention. - Refer to [name] for knee and IT band rehabilitation

## 2024-04-19 NOTE — Assessment & Plan Note (Addendum)
 Knee pain and mechanical symptoms - History of knee pain with prior imaging showing arthritis; July 2025 x-ray demonstrated 'bone on bone' changes and loss of joint space - Sensation of popping in the knee when standing - Feels unsteady and at risk of falling if not holding onto something - Previous cortisone injections provided relief, but she is cautious about frequent use due to concerns about bone health  LEFT KNEE INSPECTION: No swelling, erythema, or deformity PALPATION: Tenderness at the medial joint line; maximal tenderness at the posterolateral aspect of the lateral joint line; quadriceps and patellar tendons nontender RANGE OF MOTION: Limited active and passive ROM with pain, no overt mechanical obstruction SPECIAL TESTS: Valgus and varus stressing negative; no ligamentous laxity anteriorly or posteriorly  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 Severe arthritis on x-ray. Pain at posterolateral lateral joint line and IT band tenderness. Previous cortisone injections effective but concerns about bone health. Discussed gel injections as alternative. Emphasized cortisone's temporary relief role to aid physical therapy. Physical therapy primary for stability and function. - Refer to [name] for knee and IT band rehabilitation. - Consider cortisone injection if pain interferes with physical therapy. - Discuss gel injections if cortisone is needed frequently. - Monitor for worsening symptoms and contact provider if pain increases.

## 2024-04-19 NOTE — Assessment & Plan Note (Addendum)
 Left leg and thigh pain - Pain began approximately five weeks ago following a fall - Pain localized to the left outer leg and sometimes up to the lateral hip, with the most severe pain on the outer hip and lateral aspect of the leg - Does not feel like patient's reported prior right-sided sciatica episodes and she has not experienced sciatica on the left previously  LEFT KNEE INSPECTION: No swelling, erythema, or deformity PALPATION: Tenderness at the medial joint line; maximal tenderness at the posterolateral aspect of the lateral joint line; quadriceps and patellar tendons nontender RANGE OF MOTION: Limited active and passive ROM with pain, no overt mechanical obstruction SPECIAL TESTS: Valgus and varus stressing negative; no ligamentous laxity anteriorly or posteriorly  LEFT IT BAND PALPATION: Tenderness throughout the mid to distal iliotibial band; nontender at the greater trochanter  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 Severe arthritis on x-ray. Pain at posterolateral lateral joint line and IT band tenderness. Previous cortisone injections effective but concerns about bone health. Discussed gel injections as alternative. Emphasized cortisone's temporary relief role to aid physical therapy. Physical therapy primary for stability and function. - Refer to [name] for knee and IT band rehabilitation. - Consider cortisone injection if pain interferes with physical therapy. - Discuss gel injections if cortisone is needed frequently. - Monitor for worsening symptoms and contact provider if pain increases.

## 2024-04-19 NOTE — Assessment & Plan Note (Signed)
 Ankle pain - Ankle pain has improved, allowing for good mobility without significant discomfort during daily activities such as getting out of bed or going down stairs  Functional status and physical therapy - Unable to attend physical therapy recently due to family illnesses - Desires to resume physical therapy to improve walking ability  RIGHT ANKLE INSPECTION: No swelling, erythema, or deformity PALPATION: Symmetric tenderness along the gastrocnemius-Achilles complex bilaterally; right Achilles insertion nontender; no tenderness throughout the deltoid ligament RANGE OF MOTION: Full active and passive ROM without pain SPECIAL TESTS: Anterior drawer and talar tilt benign

## 2024-04-24 ENCOUNTER — Other Ambulatory Visit: Payer: Self-pay | Admitting: Internal Medicine

## 2024-04-24 DIAGNOSIS — R928 Other abnormal and inconclusive findings on diagnostic imaging of breast: Secondary | ICD-10-CM

## 2024-04-26 ENCOUNTER — Ambulatory Visit: Admitting: Family Medicine

## 2024-04-29 ENCOUNTER — Encounter: Payer: Self-pay | Admitting: Family Medicine

## 2024-04-30 NOTE — Telephone Encounter (Signed)
 Please review and place home PT order if appropriate.  JM

## 2024-05-01 ENCOUNTER — Other Ambulatory Visit (INDEPENDENT_AMBULATORY_CARE_PROVIDER_SITE_OTHER): Payer: Self-pay | Admitting: Radiology

## 2024-05-01 ENCOUNTER — Ambulatory Visit (INDEPENDENT_AMBULATORY_CARE_PROVIDER_SITE_OTHER): Admitting: Family Medicine

## 2024-05-01 ENCOUNTER — Encounter: Payer: Self-pay | Admitting: Family Medicine

## 2024-05-01 VITALS — BP 126/84 | HR 62 | Ht 66.0 in | Wt 204.0 lb

## 2024-05-01 DIAGNOSIS — W19XXXA Unspecified fall, initial encounter: Secondary | ICD-10-CM

## 2024-05-01 DIAGNOSIS — M1712 Unilateral primary osteoarthritis, left knee: Secondary | ICD-10-CM

## 2024-05-01 DIAGNOSIS — M79604 Pain in right leg: Secondary | ICD-10-CM

## 2024-05-01 DIAGNOSIS — M7632 Iliotibial band syndrome, left leg: Secondary | ICD-10-CM | POA: Diagnosis not present

## 2024-05-01 DIAGNOSIS — S86011A Strain of right Achilles tendon, initial encounter: Secondary | ICD-10-CM | POA: Diagnosis not present

## 2024-05-01 DIAGNOSIS — M7071 Other bursitis of hip, right hip: Secondary | ICD-10-CM | POA: Diagnosis not present

## 2024-05-01 DIAGNOSIS — M79605 Pain in left leg: Secondary | ICD-10-CM | POA: Diagnosis not present

## 2024-05-01 MED ORDER — TRIAMCINOLONE ACETONIDE 40 MG/ML IJ SUSP
40.0000 mg | Freq: Once | INTRAMUSCULAR | Status: AC
  Administered 2024-05-01: 40 mg via INTRAMUSCULAR

## 2024-05-01 NOTE — Addendum Note (Signed)
 Addended by: Jayzon Taras J on: 05/01/2024 08:26 PM   Modules accepted: Orders

## 2024-05-01 NOTE — Assessment & Plan Note (Signed)
 History of Present Illness Ariana Guzman is an 83 year old female who presents for chronic knee pain.  Chronic left knee pain - Chronic pain in the left knee significantly impairs daily activities, including dressing and walking - Pain is most severe during transitions from sitting to standing - Required thirteen minutes to exit the car due to knee discomfort - Previous cortisone injections in the left knee have provided symptomatic relief - Applies Voltaren gel up to four times daily for pain management - Uses Tylenol  as needed for additional pain control - Currently on a blood thinner, which is considered when using topical treatments  Mobility impairment and functional limitations - Difficulty with ambulation and balance, leading to concerns about walking and risk of falls - No longer driving due to other injuries, limiting access to outpatient physical therapy - Desires to improve mobility and return to driving - Massage therapy has been helpful for symptom relief in the past  Assessment and Plan Left knee osteoarthritis Chronic osteoarthritis with significant pain and functional limitations. Cortisone injections discussed for short-term activity maintenance. Gel injections considered safer long-term. - Administer cortisone injection under ultrasound guidance. - Advise use of ice and heat for pain management. - Recommend Tylenol  for pain relief. - Instruct to apply Voltaren gel, avoiding the injection site until healed. - Refer to home physical therapy for balance and mobility improvement. - Advise to contact if pain persists after two weeks for further evaluation. - Discuss potential for viscosupplementation if symptoms recur. - Coordinate with insurance for potential authorization of gel injections if needed.

## 2024-05-01 NOTE — Patient Instructions (Signed)
 Patient Plan  Left Knee Osteoarthritis  - Cortisone injection given under ultrasound guidance. - Use ice and heat as needed for pain management. - Take Tylenol  for pain relief as needed. - Apply Voltaren gel for pain, but avoid the injection site until it has healed. - Begin home physical therapy to improve balance and mobility. - Contact the office if pain persists after two weeks for further evaluation. - Gel injections (viscosupplementation) may be considered if symptoms return; insurance authorization will be coordinated if needed.  Red flags - seek care if you notice:  - Sudden or severe knee swelling, redness, or warmth - Fever or chills - Severe pain or inability to move or bear weight on the leg - Signs of infection at the injection site (increased redness, swelling, drainage) - New or worsening symptoms that concern you

## 2024-05-01 NOTE — Progress Notes (Signed)
 Primary Care / Sports Medicine Office Visit  Patient Information:  Patient ID: Ariana Guzman, female DOB: 05-May-1941 Age: 83 y.o. MRN: [id number]   Ariana Guzman is a pleasant 83 y.o. female presenting with the following:  Chief Complaint  Patient presents with   Knee Pain    Patient presents today for cortisone injection in her left knee.    Vitals:   05/01/24 1605  BP: 126/84  Pulse: 62  SpO2: 98%   Vitals:   05/01/24 1605  Weight: 204 lb (92.5 kg)  Height: 5' 6 (1.676 m)   Body mass index is 32.93 kg/m.   Discussed the use of AI scribe software for clinical note transcription with the patient, who gave verbal consent to proceed.   Independent interpretation of notes and tests performed by another provider:   None  Procedures performed:   Procedure:  Injection of left knee under ultrasound guidance. Ultrasound guidance utilized for anterolateral approach, no effusion Samsung HS60 device utilized with permanent recording / reporting. Verbal informed consent obtained and verified. Skin prepped in a sterile fashion. Ethyl chloride for topical local analgesia.  Completed without difficulty and tolerated well. Medication: triamcinolone  acetonide 40 mg/mL suspension for injection 1 mL total and 2 mL lidocaine  1% without epinephrine  utilized for needle placement anesthetic Advised to contact for fevers/chills, erythema, induration, drainage, or persistent bleeding.   Pertinent History, Exam, Impression, and Recommendations:   Problem List Items Addressed This Visit     Primary osteoarthritis of left knee - Primary (Chronic)   History of Present Illness Ariana Guzman is an 83 year old female who presents for chronic knee pain.  Chronic left knee pain - Chronic pain in the left knee significantly impairs daily activities, including dressing and walking - Pain is most severe during transitions from sitting to standing - Required thirteen minutes to exit the car  due to knee discomfort - Previous cortisone injections in the left knee have provided symptomatic relief - Applies Voltaren gel up to four times daily for pain management - Uses Tylenol  as needed for additional pain control - Currently on a blood thinner, which is considered when using topical treatments  Mobility impairment and functional limitations - Difficulty with ambulation and balance, leading to concerns about walking and risk of falls - No longer driving due to other injuries, limiting access to outpatient physical therapy - Desires to improve mobility and return to driving - Massage therapy has been helpful for symptom relief in the past  Assessment and Plan Left knee osteoarthritis Chronic osteoarthritis with significant pain and functional limitations. Cortisone injections discussed for short-term activity maintenance. Gel injections considered safer long-term. - Administer cortisone injection under ultrasound guidance. - Advise use of ice and heat for pain management. - Recommend Tylenol  for pain relief. - Instruct to apply Voltaren gel, avoiding the injection site until healed. - Refer to home physical therapy for balance and mobility improvement. - Advise to contact if pain persists after two weeks for further evaluation. - Discuss potential for viscosupplementation if symptoms recur. - Coordinate with insurance for potential authorization of gel injections if needed.      Relevant Orders   US  LIMITED JOINT SPACE STRUCTURES LOW LEFT     Orders & Medications Medications:  Meds ordered this encounter  Medications   triamcinolone  acetonide (KENALOG -40) injection 40 mg   Orders Placed This Encounter  Procedures   US  LIMITED JOINT SPACE STRUCTURES LOW LEFT     No follow-ups  on file.     Selinda JINNY Ku, MD, [facility]   Primary Care Sports Medicine Primary Care and Sports Medicine at [facility]

## 2024-05-03 ENCOUNTER — Ambulatory Visit
Admission: RE | Admit: 2024-05-03 | Discharge: 2024-05-03 | Disposition: A | Discharge status: Home / Self Care | Source: Ambulatory Visit | Attending: Internal Medicine | Admitting: Internal Medicine

## 2024-05-03 DIAGNOSIS — R928 Other abnormal and inconclusive findings on diagnostic imaging of breast: Secondary | ICD-10-CM

## 2024-05-03 DIAGNOSIS — R92313 Mammographic fatty tissue density, bilateral breasts: Secondary | ICD-10-CM | POA: Diagnosis not present

## 2024-05-07 DIAGNOSIS — M1712 Unilateral primary osteoarthritis, left knee: Secondary | ICD-10-CM | POA: Diagnosis not present

## 2024-05-07 DIAGNOSIS — I872 Venous insufficiency (chronic) (peripheral): Secondary | ICD-10-CM | POA: Diagnosis not present

## 2024-05-07 DIAGNOSIS — I482 Chronic atrial fibrillation, unspecified: Secondary | ICD-10-CM | POA: Diagnosis not present

## 2024-05-07 DIAGNOSIS — N1831 Chronic kidney disease, stage 3a: Secondary | ICD-10-CM | POA: Diagnosis not present

## 2024-05-07 DIAGNOSIS — I129 Hypertensive chronic kidney disease with stage 1 through stage 4 chronic kidney disease, or unspecified chronic kidney disease: Secondary | ICD-10-CM | POA: Diagnosis not present

## 2024-05-07 DIAGNOSIS — E1122 Type 2 diabetes mellitus with diabetic chronic kidney disease: Secondary | ICD-10-CM | POA: Diagnosis not present

## 2024-05-10 DIAGNOSIS — I482 Chronic atrial fibrillation, unspecified: Secondary | ICD-10-CM | POA: Diagnosis not present

## 2024-05-14 ENCOUNTER — Telehealth: Payer: Self-pay | Admitting: Internal Medicine

## 2024-05-14 NOTE — Telephone Encounter (Signed)
 faxed

## 2024-05-14 NOTE — Telephone Encounter (Signed)
 Copied from CRM [id number]. Topic: Clinical - Home Health Verbal Orders >> May 14, 2024  2:39 PM Antony RAMAN wrote: Caller/Agency:  [facility]rod [facility] Number: [phone] Service Requested: Occupational Therapy Frequency: 1 time a week for 5 weeks Any new concerns about the patient? No

## 2024-05-15 ENCOUNTER — Telehealth: Payer: Self-pay

## 2024-05-15 NOTE — Telephone Encounter (Signed)
 Those were just physical therapy orders not home health.  JM

## 2024-05-15 NOTE — Telephone Encounter (Signed)
 Spoke with Micelle and informed her we need paperwork to fill out home health orders as of Sept 1 for charging orders.  JM  Copied from CRM #[id number]. Topic: General - Other >> May 15, 2024  1:57 PM Tysheama G wrote: Reason for CRM: [facility] occupational therapist calling to check status on a Home Health Verbal Orders that was submitted yesterday. Advised her it's been faxed but she will like more info on that. Callback number [phone]

## 2024-05-16 ENCOUNTER — Other Ambulatory Visit: Payer: Self-pay | Admitting: Internal Medicine

## 2024-05-16 DIAGNOSIS — F5101 Primary insomnia: Secondary | ICD-10-CM

## 2024-05-18 NOTE — Telephone Encounter (Signed)
 Please review.  KP

## 2024-05-18 NOTE — Telephone Encounter (Signed)
 Requested medications are due for refill today.  yes  Requested medications are on the active medications list.  yes  Last refill. 02/20/2024 #30 2 rf  Future visit scheduled.   yes  Notes to clinic.  Refill not delegated    Requested Prescriptions  Pending Prescriptions Disp Refills   ALPRAZolam  (XANAX ) 0.25 MG tablet [Pharmacy Med Name: ALPRAZOLAM  0.25MG    TAB] 30 tablet 0    Sig: TAKE 1 TABLET BY MOUTH AT BEDTIME     Not Delegated - Psychiatry: Anxiolytics/Hypnotics 2 Failed - 05/18/2024  9:55 AM      Failed - This refill cannot be delegated      Failed - Urine Drug Screen completed in last 360 days      Passed - Patient is not pregnant      Passed - Valid encounter within last 6 months    Recent Outpatient Visits           2 weeks ago Primary osteoarthritis of left knee   [place] [facility] Alvia, Selinda PARAS, MD   4 weeks ago Strain of right Achilles tendon, subsequent encounter   [place] [facility]via, Selinda PARAS, MD   1 month ago Acute rhinitis   [place] [facility] Alvia, Selinda PARAS, MD   2 months ago Strain of right Achilles tendon, initial encounter   [facility] at [facility]via, Selinda PARAS, MD   3 months ago Annual physical exam   [facility] at [facility], Leita DEL, MD       Future Appointments             In 3 weeks Justus, Leita DEL, MD [facility] Primary Care & Sports Medicine at [facility], [phone] [place]

## 2024-05-31 ENCOUNTER — Ambulatory Visit

## 2024-06-02 ENCOUNTER — Other Ambulatory Visit: Payer: Self-pay | Admitting: Internal Medicine

## 2024-06-02 DIAGNOSIS — E039 Hypothyroidism, unspecified: Secondary | ICD-10-CM

## 2024-06-03 ENCOUNTER — Other Ambulatory Visit: Payer: Self-pay | Admitting: Internal Medicine

## 2024-06-03 DIAGNOSIS — I10 Essential (primary) hypertension: Secondary | ICD-10-CM

## 2024-06-05 NOTE — Telephone Encounter (Signed)
 Requested Prescriptions  Pending Prescriptions Disp Refills   losartan  (COZAAR ) 100 MG tablet [Pharmacy Med Name: Losartan  Potassium 100 MG Oral Tablet] 90 tablet 0    Sig: Take 1 tablet by mouth once daily     Cardiovascular:  Angiotensin Receptor Blockers Passed - 06/05/2024 12:22 PM      Passed - Cr in normal range and within 180 days    Creatinine, Ser  Date Value Ref Range Status  02/09/2024 0.91 0.57 - 1.00 mg/dL Final         Passed - K in normal range and within 180 days    Potassium  Date Value Ref Range Status  02/09/2024 4.8 3.5 - 5.2 mmol/L Final         Passed - Patient is not pregnant      Passed - Last BP in normal range    BP Readings from Last 1 Encounters:  05/01/24 126/84         Passed - Valid encounter within last 6 months    Recent Outpatient Visits           1 month ago Primary osteoarthritis of left knee   [place] [facility] Alvia, Selinda PARAS, MD   1 month ago Strain of right Achilles tendon, subsequent encounter   [place] [facility]via, Selinda PARAS, MD   2 months ago Acute rhinitis   [place] [facility]via, Selinda PARAS, MD   2 months ago Strain of right Achilles tendon, initial encounter   [facility] Medicine at [facility], Selinda PARAS, MD   3 months ago Annual physical exam   [facility] Primary Care & Sports Medicine at [facility], Leita DEL, MD       Future Appointments             In 1 week Justus, Leita DEL, MD [facility] Primary Care & Sports Medicine at [facility], [phone] [place]

## 2024-06-05 NOTE — Telephone Encounter (Signed)
 Requested medication (s) are due for refill today: yes   Requested medication (s) are on the active medication list: yes   Last refill:  03/06/24 #90 0 refills  Future visit scheduled: yes in 1 week   Notes to clinic:  no refills remain do you want to refill Rx before OV?     Requested Prescriptions  Pending Prescriptions Disp Refills   levothyroxine  (SYNTHROID ) 75 MCG tablet [Pharmacy Med Name: Levothyroxine  Sodium 75 MCG Oral Tablet] 90 tablet 0    Sig: TAKE 1 TABLET BY MOUTH ONCE DAILY BEFORE BREAKFAST     Endocrinology:  Hypothyroid Agents Passed - 06/05/2024 11:57 AM      Passed - TSH in normal range and within 360 days    TSH  Date Value Ref Range Status  02/09/2024 2.110 0.450 - 4.500 uIU/mL Final         Passed - Valid encounter within last 12 months    Recent Outpatient Visits           1 month ago Primary osteoarthritis of left knee   [place] [facility], Selinda PARAS, MD   1 month ago Strain of right Achilles tendon, subsequent encounter   [place] [facility], Selinda PARAS, MD   2 months ago Acute rhinitis   [place] [facility], Selinda PARAS, MD   2 months ago Strain of right Achilles tendon, initial encounter   [facility] Medicine at [facility], Selinda PARAS, MD   3 months ago Annual physical exam   [facility] Primary Care & Sports Medicine at [facility], Leita DEL, MD       Future Appointments             In 1 week Justus, Leita DEL, MD [facility] Primary Care & Sports Medicine at [facility], [phone] [place]

## 2024-06-14 ENCOUNTER — Encounter: Payer: Self-pay | Admitting: Internal Medicine

## 2024-06-14 ENCOUNTER — Ambulatory Visit: Admitting: Internal Medicine

## 2024-06-14 ENCOUNTER — Ambulatory Visit

## 2024-06-14 ENCOUNTER — Other Ambulatory Visit: Payer: Self-pay | Admitting: Internal Medicine

## 2024-06-14 VITALS — BP 128/76 | HR 77 | Ht 66.0 in | Wt 202.0 lb

## 2024-06-14 DIAGNOSIS — E118 Type 2 diabetes mellitus with unspecified complications: Secondary | ICD-10-CM

## 2024-06-14 DIAGNOSIS — M48061 Spinal stenosis, lumbar region without neurogenic claudication: Secondary | ICD-10-CM | POA: Diagnosis not present

## 2024-06-14 DIAGNOSIS — F5101 Primary insomnia: Secondary | ICD-10-CM | POA: Diagnosis not present

## 2024-06-14 DIAGNOSIS — Z0000 Encounter for general adult medical examination without abnormal findings: Secondary | ICD-10-CM | POA: Diagnosis not present

## 2024-06-14 DIAGNOSIS — I10 Essential (primary) hypertension: Secondary | ICD-10-CM

## 2024-06-14 DIAGNOSIS — Z23 Encounter for immunization: Secondary | ICD-10-CM

## 2024-06-14 DIAGNOSIS — F411 Generalized anxiety disorder: Secondary | ICD-10-CM

## 2024-06-14 LAB — POCT GLYCOSYLATED HEMOGLOBIN (HGB A1C): Hemoglobin A1C: 6.3 % — AB (ref 4.0–5.6)

## 2024-06-14 MED ORDER — ALPRAZOLAM 0.25 MG PO TABS: 0.2500 mg | ORAL_TABLET | Freq: Every day | ORAL | 3 refills | Status: AC

## 2024-06-14 MED ORDER — SPIRONOLACTONE 25 MG PO TABS: 25.0000 mg | ORAL_TABLET | Freq: Two times a day (BID) | ORAL | 1 refills | Status: AC

## 2024-06-14 MED ORDER — METOPROLOL TARTRATE 25 MG PO TABS: ORAL_TABLET | ORAL | 0 refills | Status: AC

## 2024-06-14 NOTE — Assessment & Plan Note (Signed)
 She uses a walker or wheelchair around her home and in the community. Pain level is mild unless she stands or walks for extended periods of time. She is having PTx at her home and they suggested a walker with a seat - Rx written and family will obtain the walker.

## 2024-06-14 NOTE — Assessment & Plan Note (Signed)
 Well controlled blood pressure today. Current regimen is losartan , metoprolol  and spironolactone . No medication side effects noted.

## 2024-06-14 NOTE — Progress Notes (Unsigned)
 Date:  06/14/2024   Name:  Ariana Guzman   DOB:  1940/11/10   MRN:  [id number]   Chief Complaint: No chief complaint on file.  Hypertension This is a chronic problem. Associated symptoms include anxiety. Pertinent negatives include no chest pain, headaches, palpitations or shortness of breath. Past treatments include angiotensin blockers, beta blockers and diuretics. The current treatment provides significant improvement. There is no history of kidney disease, CAD/MI or CVA.  Diabetes She presents for her follow-up diabetic visit. She has type 2 diabetes mellitus. Her disease course has been stable. Pertinent negatives for hypoglycemia include no dizziness or headaches. Pertinent negatives for diabetes include no chest pain, no fatigue and no weakness. Pertinent negatives for diabetic complications include no CVA. Current diabetic treatment includes diet.  Anxiety Presents for follow-up visit. Patient reports no chest pain, dizziness, palpitations or shortness of breath.      Review of Systems  Constitutional:  Negative for fatigue and unexpected weight change.  HENT:  Negative for trouble swallowing.   Eyes:  Negative for visual disturbance.  Respiratory:  Negative for cough, chest tightness, shortness of breath and wheezing.   Cardiovascular:  Negative for chest pain, palpitations and leg swelling.  Gastrointestinal:  Negative for abdominal pain, constipation and diarrhea.  Musculoskeletal:  Negative for arthralgias and myalgias.  Neurological:  Negative for dizziness, weakness, light-headedness and headaches.     Lab Results  Component Value Date   NA 133 (L) 02/09/2024   K 4.8 02/09/2024   CO2 21 02/09/2024   GLUCOSE 100 (H) 02/09/2024   BUN 24 02/09/2024   CREATININE 0.91 02/09/2024   CALCIUM 9.5 02/09/2024   EGFR 62 02/09/2024   GFRNONAA 63 08/14/2020   Lab Results  Component Value Date   CHOL 148 02/09/2024   HDL 47 02/09/2024   LDLCALC 88 02/09/2024   TRIG 64  02/09/2024   CHOLHDL 3.1 02/09/2024   Lab Results  Component Value Date   TSH 2.110 02/09/2024   Lab Results  Component Value Date   HGBA1C 6.4 (H) 02/09/2024   Lab Results  Component Value Date   WBC 9.5 02/09/2024   HGB 13.1 02/09/2024   HCT 41.3 02/09/2024   MCV 94 02/09/2024   PLT 305 02/09/2024   Lab Results  Component Value Date   ALT 12 02/09/2024   AST 12 02/09/2024   ALKPHOS 77 02/09/2024   BILITOT 1.0 02/09/2024   No results found for: MARIEN BOLLS, VD25OH   Patient Active Problem List   Diagnosis Date Noted   It band syndrome, left 04/19/2024   Strain of right Achilles tendon 03/15/2024   Sprain of deltoid ligament of right ankle 03/15/2024   Fall 03/08/2024   Ecchymosis 03/08/2024   Obesity, morbid (HCC) 09/22/2023   Stage 3a chronic kidney disease (HCC) 09/22/2023   Ganglion cyst of finger of right hand 07/12/2022   Acute rhinitis 06/25/2022   Primary osteoarthritis of left knee 06/11/2022   Spinal stenosis of lumbar region without neurogenic claudication 06/11/2022   Venous stasis dermatitis of both lower extremities 02/11/2022   Slow transit constipation 12/25/2020   Bursitis of hip 06/05/2020   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   Hyponatremia 04/05/2020   Environmental and seasonal allergies 04/03/2020   Type II diabetes mellitus with complication (HCC) 05/29/2018   Primary insomnia 09/15/2017   Generalized anxiety disorder 09/15/2017   Essential hypertension 09/09/2017   Varicose veins of bilateral lower extremities with other complications 09/09/2017   Bilateral lower extremity  edema 09/09/2017   Bilateral leg pain 09/09/2017   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/03/2015   Obstructive sleep apnea 10/07/2015   Asthma, persistent controlled 09/25/2015   Acquired hypothyroidism 02/29/2012   Chronic atrial fibrillation (HCC) 02/29/2012   Mixed hyperlipidemia 02/29/2012    Allergies  Allergen Reactions    Tramadol Shortness Of Breath   Meloxicam Rash    Past Surgical History:  Procedure Laterality Date   ABDOMINAL HYSTERECTOMY  1991   uterus only- still has cervix - done for prolapse   CATARACT EXTRACTION W/PHACO Left 11/24/2020   Procedure: CATARACT EXTRACTION PHACO AND INTRAOCULAR LENS PLACEMENT (IOC) LEFT 6.99 00:46.1;  Surgeon: Myrna Adine Anes, MD;  Location: [facility];  Service: Ophthalmology;  Laterality: Left;   CATARACT EXTRACTION W/PHACO Right 12/08/2020   Procedure: CATARACT EXTRACTION PHACO AND INTRAOCULAR LENS PLACEMENT (IOC) RIGHT 5.13 00:34.2;  Surgeon: Myrna Adine Anes, MD;  Location: [facility];  Service: Ophthalmology;  Laterality: Right;   COLONOSCOPY  2009   normal   TOE SURGERY      Social History   Tobacco Use   Smoking status: Former    Current packs/day: 0.00    Average packs/day: 0.5 packs/day for 29.0 years (14.5 ttl pk-yrs)    Types: Cigarettes    Start date: 09/28/1960    Quit date: 09/28/1989    Years since quitting: 34.7   Smokeless tobacco: Never   Tobacco comments:    smoking cessation materials not required  Vaping Use   Vaping status: Never Used  Substance Use Topics   Alcohol use: No   Drug use: No     Medication list has been reviewed and updated.  No outpatient medications have been marked as taking for the 06/14/24 encounter (Orders Only) with Justus Leita DEL, MD.       02/09/2024   10:41 AM 09/22/2023   11:03 AM 04/28/2023    9:51 AM 02/14/2023    1:44 PM  GAD 7 : Generalized Anxiety Score  Nervous, Anxious, on Edge 0 0 1   Control/stop worrying 0 0 0 0  Worry too much - different things 0 0 0 0  Trouble relaxing 0 0 0 0  Restless 0 0 0 0  Easily annoyed or irritable 0 0 0 0  Afraid - awful might happen 0 0 0 0  Total GAD 7 Score 0 0 1   Anxiety Difficulty Not difficult at all Not difficult at all Not difficult at all Not difficult at all       04/19/2024    8:48 AM 02/09/2024   10:41 AM 09/22/2023    11:03 AM  Depression screen PHQ 2/9  Decreased Interest 0 0 0  Down, Depressed, Hopeless 0 0 0  PHQ - 2 Score 0 0 0  Altered sleeping  0 0  Tired, decreased energy  0 0  Change in appetite  0 0  Feeling bad or failure about yourself   0 0  Trouble concentrating  0 0  Moving slowly or fidgety/restless  0 0  Suicidal thoughts  0 0  PHQ-9 Score  0 0  Difficult doing work/chores  Not difficult at all Not difficult at all    BP Readings from Last 3 Encounters:  05/01/24 126/84  04/19/24 (!) 140/80  03/20/24 118/70    Physical Exam Vitals and nursing note reviewed.  Constitutional:      General: She is not in acute distress.    Appearance: She is well-developed.  HENT:     Head: Normocephalic and atraumatic.  Pulmonary:     Effort: Pulmonary effort is normal. No respiratory distress.  Skin:    General: Skin is warm and dry.     Findings: No rash.  Neurological:     Mental Status: She is alert and oriented to person, place, and time.  Psychiatric:        Mood and Affect: Mood normal.        Behavior: Behavior normal.     Wt Readings from Last 3 Encounters:  05/01/24 204 lb (92.5 kg)  04/19/24 204 lb 9.6 oz (92.8 kg)  03/15/24 197 lb (89.4 kg)    There were no vitals taken for this visit.  Assessment and Plan:  Problem List Items Addressed This Visit   None   No follow-ups on file.    Leita HILARIO Adie, MD [facility] [place]

## 2024-06-14 NOTE — Patient Instructions (Signed)
 Ms. Wenz,  We hope you enjoyed your visit with our office! Your feedback means so much to our team, and it helps us  to continue providing the best care possible. If you had a positive experience, we'd love if you could share it by leaving us  a [employer] Review and also completing our patient survey that you'll receive soon.  Your kind words not only brighten our day but also help other patients feel confident in choosing our office for their care.  Thank you for being a part of our practice family!   Dr. Leita Adie & Eual Grieves, CMA, [facility]  Primary Care & [facility] [place] [address]  [place] [address] Office [phone]  Fax: [phone]'

## 2024-06-14 NOTE — Assessment & Plan Note (Signed)
 Long standing insomnia and mild anxiety Has tried CBT but unable to completely wean off of alprazolam . Will continue alprazolam  0.25 mg at bedtime prn.

## 2024-06-14 NOTE — Progress Notes (Addendum)
 Date:  06/14/2024   Name:  Ariana Guzman   DOB:  1940-08-21   MRN:  [id number]   Chief Complaint: Hypertension Frieda with seat wants order ) and Diabetes  Hypertension This is a chronic problem. The problem is controlled. Pertinent negatives include no chest pain, headaches, palpitations or shortness of breath. Hypertensive end-organ damage includes CAD/MI (chronic Afib).  Diabetes She presents for her follow-up diabetic visit. She has type 2 diabetes mellitus. Pertinent negatives for hypoglycemia include no headaches, nervousness/anxiousness or tremors. Pertinent negatives for diabetes include no chest pain, no fatigue and no polyuria. Current diabetic treatment includes diet.  Back Pain This is a chronic problem. The problem occurs constantly. The problem is unchanged. The pain is present in the lumbar spine. The quality of the pain is described as aching and burning. Pertinent negatives include no abdominal pain, chest pain, dysuria, fever, headaches or numbness. Risk factors: spinal stenosis. She has tried home exercises for the symptoms. The treatment provided mild relief.  Insomnia Primary symptoms: sleep disturbance.   The problem occurs every several days. The problem is unchanged. Treatments tried: low dose Xanax .    Review of Systems  Constitutional:  Negative for appetite change, fatigue, fever and unexpected weight change.  HENT:  Negative for trouble swallowing.   Eyes:  Negative for visual disturbance.  Respiratory:  Negative for cough, chest tightness and shortness of breath.   Cardiovascular:  Negative for chest pain, palpitations and leg swelling.  Gastrointestinal:  Negative for abdominal pain.  Endocrine: Negative for polyuria.  Genitourinary:  Negative for dysuria and hematuria.  Musculoskeletal:  Positive for back pain and gait problem. Negative for arthralgias.  Neurological:  Negative for tremors, numbness and headaches.  Psychiatric/Behavioral:  Positive  for sleep disturbance. Negative for dysphoric mood. The patient has insomnia. The patient is not nervous/anxious.      Lab Results  Component Value Date   NA 133 (L) 02/09/2024   K 4.8 02/09/2024   CO2 21 02/09/2024   GLUCOSE 100 (H) 02/09/2024   BUN 24 02/09/2024   CREATININE 0.91 02/09/2024   CALCIUM 9.5 02/09/2024   EGFR 62 02/09/2024   GFRNONAA 63 08/14/2020   Lab Results  Component Value Date   CHOL 148 02/09/2024   HDL 47 02/09/2024   LDLCALC 88 02/09/2024   TRIG 64 02/09/2024   CHOLHDL 3.1 02/09/2024   Lab Results  Component Value Date   TSH 2.110 02/09/2024   Lab Results  Component Value Date   HGBA1C 6.3 (A) 06/14/2024   Lab Results  Component Value Date   WBC 9.5 02/09/2024   HGB 13.1 02/09/2024   HCT 41.3 02/09/2024   MCV 94 02/09/2024   PLT 305 02/09/2024   Lab Results  Component Value Date   ALT 12 02/09/2024   AST 12 02/09/2024   ALKPHOS 77 02/09/2024   BILITOT 1.0 02/09/2024   No results found for: MARIEN BOLLS, VD25OH   Patient Active Problem List   Diagnosis Date Noted   It band syndrome, left 04/19/2024   Strain of right Achilles tendon 03/15/2024   Sprain of deltoid ligament of right ankle 03/15/2024   Fall 03/08/2024   Ecchymosis 03/08/2024   Obesity, morbid (HCC) 09/22/2023   Stage 3a chronic kidney disease (HCC) 09/22/2023   Ganglion cyst of finger of right hand 07/12/2022   Acute rhinitis 06/25/2022   Primary osteoarthritis of left knee 06/11/2022   Spinal stenosis of lumbar region without neurogenic claudication 06/11/2022  Venous stasis dermatitis of both lower extremities 02/11/2022   Slow transit constipation 12/25/2020   Bursitis of hip 06/05/2020   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   Hyponatremia 04/05/2020   Environmental and seasonal allergies 04/03/2020   Type II diabetes mellitus with complication (HCC) 05/29/2018   Primary insomnia 09/15/2017   Generalized anxiety disorder 09/15/2017   Essential  hypertension 09/09/2017   Varicose veins of bilateral lower extremities with other complications 09/09/2017   Bilateral lower extremity edema 09/09/2017   Bilateral leg pain 09/09/2017   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/03/2015   Obstructive sleep apnea 10/07/2015   Asthma, persistent controlled 09/25/2015   Acquired hypothyroidism 02/29/2012   Chronic atrial fibrillation (HCC) 02/29/2012   Mixed hyperlipidemia 02/29/2012    Allergies  Allergen Reactions   Tramadol Shortness Of Breath   Meloxicam Rash    Past Surgical History:  Procedure Laterality Date   ABDOMINAL HYSTERECTOMY  1991   uterus only- still has cervix - done for prolapse   CATARACT EXTRACTION W/PHACO Left 11/24/2020   Procedure: CATARACT EXTRACTION PHACO AND INTRAOCULAR LENS PLACEMENT (IOC) LEFT 6.99 00:46.1;  Surgeon: Myrna Adine Anes, MD;  Location: [facility];  Service: Ophthalmology;  Laterality: Left;   CATARACT EXTRACTION W/PHACO Right 12/08/2020   Procedure: CATARACT EXTRACTION PHACO AND INTRAOCULAR LENS PLACEMENT (IOC) RIGHT 5.13 00:34.2;  Surgeon: Myrna Adine Anes, MD;  Location: [facility];  Service: Ophthalmology;  Laterality: Right;   COLONOSCOPY  2009   normal   TOE SURGERY      Social History   Tobacco Use   Smoking status: Former    Current packs/day: 0.00    Average packs/day: 0.5 packs/day for 29.0 years (14.5 ttl pk-yrs)    Types: Cigarettes    Start date: 09/28/1960    Quit date: 09/28/1989    Years since quitting: 34.7   Smokeless tobacco: Never   Tobacco comments:    smoking cessation materials not required  Vaping Use   Vaping status: Never Used  Substance Use Topics   Alcohol use: No   Drug use: No     Medication list has been reviewed and updated.  Current Meds  Medication Sig   albuterol  (PROVENTIL ) (2.5 MG/3ML) 0.083% nebulizer solution Take 3 mLs (2.5 mg total) by nebulization every 6 (six) hours as needed.   albuterol  (VENTOLIN   HFA) 108 (90 Base) MCG/ACT inhaler Inhale 2 puffs into the lungs every 4 (four) hours as needed.   Ascorbic Acid (VITAMIN C) 1000 MG tablet Take 1,000 mg by mouth daily.   Calcium Carb-Cholecalciferol [phone] MG-UNIT TABS Take by mouth.   cetirizine (ZYRTEC) 10 MG tablet Take 10 mg by mouth 2 (two) times daily.   cholecalciferol (VITAMIN D) 1000 units tablet Take 1,000 Units by mouth daily.   Fluticasone -Umeclidin-Vilant (TRELEGY ELLIPTA) 200-62.5-25 MCG/ACT AEPB Inhale into the lungs.   gabapentin (NEURONTIN) 100 MG capsule Take 100 mg by mouth in the morning, at noon, and at bedtime.   ipratropium (ATROVENT ) 0.06 % nasal spray Place 2 sprays into both nostrils 4 (four) times daily.   latanoprost (XALATAN) 0.005 % ophthalmic solution 1 drop at bedtime.   levothyroxine  (SYNTHROID ) 75 MCG tablet TAKE 1 TABLET BY MOUTH ONCE DAILY BEFORE BREAKFAST   losartan  (COZAAR ) 100 MG tablet Take 1 tablet by mouth once daily   Magnesium 100 MG CAPS Take by mouth.   MELATONIN PO Take by mouth.   montelukast  (SINGULAIR ) 10 MG tablet Take 10 mg by mouth daily.   Multiple Vitamins-Minerals (  OCUVITE EYE HEATLH GUMMIES PO) Take by mouth daily.   Rivaroxaban (XARELTO) 15 MG TABS tablet Take 15 mg by mouth daily with supper.    [DISCONTINUED] ALPRAZolam  (XANAX ) 0.25 MG tablet TAKE 1 TABLET BY MOUTH AT BEDTIME   [DISCONTINUED] metoprolol  tartrate (LOPRESSOR ) 25 MG tablet TAKE 1 TABLET BY MOUTH ONCE DAILY IN THE MORNING, 1 TABLET AT NOON AND 1 TABLET AT BEDTIME   [DISCONTINUED] spironolactone  (ALDACTONE ) 25 MG tablet Take 1 tablet by mouth twice daily       06/14/2024   10:42 AM 02/09/2024   10:41 AM 09/22/2023   11:03 AM 04/28/2023    9:51 AM  GAD 7 : Generalized Anxiety Score  Nervous, Anxious, on Edge 0 0 0 1  Control/stop worrying 0 0 0 0  Worry too much - different things 0 0 0 0  Trouble relaxing 0 0 0 0  Restless 0 0 0 0  Easily annoyed or irritable 0 0 0 0  Afraid - awful might happen 0 0 0 0  Total  GAD 7 Score 0 0 0 1  Anxiety Difficulty Not difficult at all Not difficult at all Not difficult at all Not difficult at all       06/14/2024   10:42 AM 04/19/2024    8:48 AM 02/09/2024   10:41 AM  Depression screen PHQ 2/9  Decreased Interest 0 0 0  Down, Depressed, Hopeless 0 0 0  PHQ - 2 Score 0 0 0  Altered sleeping 0  0  Tired, decreased energy 0  0  Change in appetite 0  0  Feeling bad or failure about yourself  0  0  Trouble concentrating 0  0  Moving slowly or fidgety/restless 0  0  Suicidal thoughts 0  0  PHQ-9 Score 0  0  Difficult doing work/chores Not difficult at all  Not difficult at all    BP Readings from Last 3 Encounters:  06/14/24 128/76  06/14/24 128/76  05/01/24 126/84    Physical Exam Vitals and nursing note reviewed.  Constitutional:      General: She is not in acute distress.    Appearance: Normal appearance. She is well-developed.  HENT:     Head: Normocephalic and atraumatic.  Cardiovascular:     Rate and Rhythm: Normal rate. Rhythm irregular.     Heart sounds: No murmur heard. Pulmonary:     Effort: Pulmonary effort is normal. No respiratory distress.     Breath sounds: No wheezing or rhonchi.  Musculoskeletal:     Cervical back: Normal range of motion.     Right lower leg: Edema present.     Left lower leg: Edema present.     Comments: TED hose on bilaterally  Lymphadenopathy:     Cervical: No cervical adenopathy.  Skin:    General: Skin is warm and dry.     Findings: No rash.  Neurological:     General: No focal deficit present.     Mental Status: She is alert and oriented to person, place, and time.     Gait: Gait abnormal (in wheelchair).  Psychiatric:        Mood and Affect: Mood normal.        Behavior: Behavior normal.     Wt Readings from Last 3 Encounters:  06/14/24 202 lb (91.6 kg)  06/14/24 202 lb (91.6 kg)  05/01/24 204 lb (92.5 kg)    BP 128/76   Pulse 77   Ht 5' 6 (1.676  m)   Wt 202 lb (91.6 kg)   SpO2 99%    BMI 32.60 kg/m   Assessment and Plan:  Problem List Items Addressed This Visit       Unprioritized   Essential hypertension (Chronic)   Well controlled blood pressure today. Current regimen is losartan , metoprolol  and spironolactone . No medication side effects noted.        Relevant Medications   metoprolol  tartrate (LOPRESSOR ) 25 MG tablet   spironolactone  (ALDACTONE ) 25 MG tablet   Primary insomnia (Chronic)   Long standing insomnia and mild anxiety Has tried CBT but unable to completely wean off of alprazolam . Will continue alprazolam  0.25 mg at bedtime prn.      Relevant Medications   ALPRAZolam  (XANAX ) 0.25 MG tablet   Type II diabetes mellitus with complication (HCC) - Primary (Chronic)   Currently medications are none - diet only.  No hypoglycemic episodes noted. Last visit medical regimen changes were none. Lab Results  Component Value Date   HGBA1C 6.3 (A) 06/14/2024  A1C today is stable.  No medications needed.  She will continue to limit sweets and continue to increase activity as tolerated.      Relevant Orders   POCT glycosylated hemoglobin (Hb A1C) (Completed)   Spinal stenosis of lumbar region without neurogenic claudication   She uses a walker or wheelchair around her home and in the community. Pain level is mild unless she stands or walks for extended periods of time. She is having PTx at her home and they suggested a walker with a seat - Rx written and family will obtain the walker.      Relevant Orders   For home use only DME 4 wheeled rolling walker with seat ([id number])   Other Visit Diagnoses       Encounter for immunization       Relevant Orders   Flu vaccine HIGH DOSE PF(Fluzone Trivalent) (Completed)      MAW done today by CMA.  Return in about 4 months (around 10/13/2024) for DM, HTN  Dr. Lemon.    Leita HILARIO Adie, MD [facility] [place]

## 2024-06-14 NOTE — Progress Notes (Signed)
 Subjective:   Ariana Guzman is a 83 y.o. female who presents for Medicare Annual (Subsequent) preventive examination.  Visit Complete: In person  Patient Medicare AWV questionnaire was completed by the patient on 06/14/2024; I have confirmed that all information answered by patient is correct and no changes since this date.  Cardiac Risk Factors include: advanced age (>32men, >101 women);diabetes mellitus;dyslipidemia;obesity (BMI >30kg/m2)     Objective:    Today's Vitals   06/14/24 1145 06/14/24 1158  BP: 128/76   Pulse: 77   Weight: 202 lb (91.6 kg)   Height: 5' 6 (1.676 m)   PainSc: 0-No pain 0-No pain   Body mass index is 32.6 kg/m.     06/14/2024   11:59 AM 05/24/2023   11:36 AM 01/01/2023   11:33 AM 05/21/2022   11:22 AM 12/29/2021   12:48 PM 12/25/2021   12:10 PM 05/13/2021   11:46 AM  Advanced Directives  Does Patient Have a Medical Advance Directive? Yes No Yes No No No No  Type of Advance Directive Living will;Healthcare Power of [employer] Power of Attorney      Copy of Healthcare Power of Attorney in Chart? No - copy requested        Would patient like information on creating a medical advance directive?  No - Patient declined  No - Patient declined   No - Patient declined    Current Medications (verified) Outpatient Encounter Medications as of 06/14/2024  Medication Sig   albuterol  (PROVENTIL ) (2.5 MG/3ML) 0.083% nebulizer solution Take 3 mLs (2.5 mg total) by nebulization every 6 (six) hours as needed.   albuterol  (VENTOLIN  HFA) 108 (90 Base) MCG/ACT inhaler Inhale 2 puffs into the lungs every 4 (four) hours as needed.   Ascorbic Acid (VITAMIN C) 1000 MG tablet Take 1,000 mg by mouth daily.   Calcium Carb-Cholecalciferol [phone] MG-UNIT TABS Take by mouth.   cetirizine (ZYRTEC) 10 MG tablet Take 10 mg by mouth 2 (two) times daily.   cholecalciferol (VITAMIN D) 1000 units tablet Take 1,000 Units by mouth daily.   Fluticasone -Umeclidin-Vilant  (TRELEGY ELLIPTA) 200-62.5-25 MCG/ACT AEPB Inhale into the lungs.   gabapentin (NEURONTIN) 100 MG capsule Take 100 mg by mouth in the morning, at noon, and at bedtime.   ipratropium (ATROVENT ) 0.06 % nasal spray Place 2 sprays into both nostrils 4 (four) times daily.   latanoprost (XALATAN) 0.005 % ophthalmic solution 1 drop at bedtime.   levothyroxine  (SYNTHROID ) 75 MCG tablet TAKE 1 TABLET BY MOUTH ONCE DAILY BEFORE BREAKFAST   losartan  (COZAAR ) 100 MG tablet Take 1 tablet by mouth once daily   Magnesium 100 MG CAPS Take by mouth.   MELATONIN PO Take by mouth.   montelukast  (SINGULAIR ) 10 MG tablet Take 10 mg by mouth daily.   Multiple Vitamins-Minerals (OCUVITE EYE HEATLH GUMMIES PO) Take by mouth daily.   Rivaroxaban (XARELTO) 15 MG TABS tablet Take 15 mg by mouth daily with supper.    [DISCONTINUED] ALPRAZolam  (XANAX ) 0.25 MG tablet TAKE 1 TABLET BY MOUTH AT BEDTIME   [DISCONTINUED] metoprolol  tartrate (LOPRESSOR ) 25 MG tablet TAKE 1 TABLET BY MOUTH ONCE DAILY IN THE MORNING, 1 TABLET AT NOON AND 1 TABLET AT BEDTIME   [DISCONTINUED] spironolactone  (ALDACTONE ) 25 MG tablet Take 1 tablet by mouth twice daily   No facility-administered encounter medications on file as of 06/14/2024.    Allergies (verified) Tramadol and Meloxicam   History: Past Medical History:  Diagnosis Date   Allergies  Arthritis    Asthma    Atrial fibrillation (HCC)    Hypertension    Peripheral arterial disease    Sleep apnea    CPAP   Past Surgical History:  Procedure Laterality Date   ABDOMINAL HYSTERECTOMY  1991   uterus only- still has cervix - done for prolapse   CATARACT EXTRACTION W/PHACO Left 11/24/2020   Procedure: CATARACT EXTRACTION PHACO AND INTRAOCULAR LENS PLACEMENT (IOC) LEFT 6.99 00:46.1;  Surgeon: Myrna Adine Anes, MD;  Location: [facility];  Service: Ophthalmology;  Laterality: Left;   CATARACT EXTRACTION W/PHACO Right 12/08/2020   Procedure: CATARACT EXTRACTION PHACO AND  INTRAOCULAR LENS PLACEMENT (IOC) RIGHT 5.13 00:34.2;  Surgeon: Myrna Adine Anes, MD;  Location: [facility];  Service: Ophthalmology;  Laterality: Right;   COLONOSCOPY  2009   normal   TOE SURGERY     Family History  Problem Relation Age of Onset   Breast cancer Other 19   Dementia Mother    Cancer Father        prostate and bone   Heart attack Sister    Social History   Socioeconomic History   Marital status: Widowed    Spouse name: Not on file   Number of children: 2   Years of education: Not on file   Highest education level: Some college, no degree  Occupational History   Occupation: retired  Tobacco Use   Smoking status: Former    Current packs/day: 0.00    Average packs/day: 0.5 packs/day for 29.0 years (14.5 ttl pk-yrs)    Types: Cigarettes    Start date: 09/28/1960    Quit date: 09/28/1989    Years since quitting: 34.7   Smokeless tobacco: Never   Tobacco comments:    smoking cessation materials not required  Vaping Use   Vaping status: Never Used  Substance and Sexual Activity   Alcohol use: No   Drug use: No   Sexual activity: Not Currently  Other Topics Concern   Not on file  Social History Narrative   Pt lives alone   Social Drivers of Health   Financial Resource Strain: Low Risk  (06/14/2024)   Overall Financial Resource Strain (CARDIA)    Difficulty of Paying Living Expenses: Not hard at all  Food Insecurity: No Food Insecurity (06/14/2024)   Hunger Vital Sign    Worried About Running Out of Food in the Last Year: Never true    Ran Out of Food in the Last Year: Never true  Transportation Needs: No Transportation Needs (06/14/2024)   PRAPARE - Administrator, Civil Service (Medical): No    Lack of Transportation (Non-Medical): No  Physical Activity: Insufficiently Active (06/14/2024)   Exercise Vital Sign    Days of Exercise per Week: 2 days    Minutes of Exercise per Session: 20 min  Stress: No Stress Concern Present  (06/14/2024)   [employer] of Occupational Health - Occupational Stress Questionnaire    Feeling of Stress: Only a little  Social Connections: Moderately Integrated (06/14/2024)   Social Connection and Isolation Panel    Frequency of Communication with Friends and Family: More than three times a week    Frequency of Social Gatherings with Friends and Family: More than three times a week    Attends Religious Services: More than 4 times per year    Active Member of [employer] or Organizations: Yes    Attends Banker Meetings: Never    Marital Status: Widowed  Tobacco Counseling Counseling given: Not Answered Tobacco comments: smoking cessation materials not required   Clinical Intake:  Pre-visit preparation completed: Yes  Pain : No/denies pain Pain Score: 0-No pain     BMI - recorded: 32.6 Nutritional Status: BMI > 30  Obese Nutritional Risks: None Diabetes: Yes CBG done?: No Did pt. bring in CBG monitor from home?: No  How often do you need to have someone help you when you read instructions, pamphlets, or other written materials from your doctor or pharmacy?: 1 - Never  Interpreter Needed?: No  Information entered by :: Gyselle Matthew, CMA   Activities of Daily Living    06/14/2024   12:00 PM  In your present state of health, do you have any difficulty performing the following activities:  Hearing? 0  Vision? 0  Difficulty concentrating or making decisions? 0  Walking or climbing stairs? 1  Dressing or bathing? 0  Doing errands, shopping? 1  Preparing Food and eating ? N  Using the Toilet? N  In the past six months, have you accidently leaked urine? Y  Do you have problems with loss of bowel control? N  Managing your Medications? N  Managing your Finances? N  Housekeeping or managing your Housekeeping? Y    Patient Care Team: Justus Leita DEL, MD as PCP - General (Internal Medicine) Stephani, Lynwood Charleston, MD as Consulting Physician  (Pulmonary Disease) Lavella Evalene Dover, MD as Consulting Physician (Cardiology) Dr Wilfred as Consulting Physician (Sleep Medicine) Myrna Adine Anes, MD as Consulting Physician (Ophthalmology) Marea Selinda RAMAN, MD as Referring Physician (Vascular Surgery)  Indicate any recent Medical Services you may have received from other than [facility] providers in the past year (date may be approximate).     Assessment:   This is a routine wellness examination for [facility].  Hearing/Vision screen Hearing Screening - Comments:: No concerns. Vision Screening - Comments:: Patient see's [facility] for Yearly vision screenings.   Goals Addressed             This Visit's Progress    DIET - EAT MORE FRUITS AND VEGETABLES   On track    DIET - INCREASE WATER INTAKE   On track    Recommend to drink at least 6-8 8oz glasses of water per day.       Depression Screen    06/14/2024   10:42 AM 04/19/2024    8:48 AM 02/09/2024   10:41 AM 09/22/2023   11:03 AM 05/24/2023   11:34 AM 04/28/2023    9:51 AM 02/14/2023    1:44 PM  PHQ 2/9 Scores  PHQ - 2 Score 0 0 0 0 0 0 0  PHQ- 9 Score 0  0 0 0 2 0    Fall Risk    06/14/2024   10:41 AM 04/19/2024    8:48 AM 02/09/2024   10:41 AM 09/22/2023   11:03 AM 05/24/2023   11:37 AM  Fall Risk   Falls in the past year? 1 1 1  0 0  Number falls in past yr: 1 0 0 0 0  Injury with Fall? 1 1 0 0 0  Risk for fall due to : No Fall Risks No Fall Risks History of fall(s);Impaired balance/gait No Fall Risks No Fall Risks  Follow up Falls evaluation completed Falls evaluation completed Falls evaluation completed Falls evaluation completed Falls prevention discussed;Falls evaluation completed    MEDICARE RISK AT HOME: Medicare Risk at Home Any stairs in or around the home?: No If  so, are there any without handrails?: No Home free of loose throw rugs in walkways, pet beds, electrical cords, etc?: Yes Adequate lighting in your home to reduce risk of falls?: Yes Life  alert?: No Use of a cane, walker or w/c?: Yes Grab bars in the bathroom?: Yes Shower chair or bench in shower?: Yes Elevated toilet seat or a handicapped toilet?: Yes  TIMED UP AND GO:  Was the test performed?  Yes  Length of time to ambulate 10 feet: 8 sec Gait steady and fast without use of assistive device    Cognitive Function:        06/14/2024   12:01 PM 05/24/2023   11:41 AM 05/21/2022   11:42 AM 05/09/2019   11:52 AM 04/24/2018    2:11 PM  6CIT Screen  What Year? 0 points 0 points 0 points 0 points 0 points  What month? 0 points 0 points 0 points 0 points 0 points  What time? 0 points 0 points 0 points 0 points 3 points  Count back from 20 0 points 0 points 0 points 0 points 0 points  Months in reverse 0 points 0 points 0 points 0 points 0 points  Repeat phrase 2 points 0 points 6 points 0 points 0 points  Total Score 2 points 0 points 6 points 0 points 3 points    Immunizations Immunization History  Administered Date(s) Administered    sv, Bivalent, Protein Subunit Rsvpref,pf (Abrysvo) 05/25/2022   Fluad Quad(high Dose 65+) 05/28/2019, 05/08/2020   Fluad Trivalent(High Dose 65+) 04/28/2023   INFLUENZA, HIGH DOSE SEASONAL PF 08/11/2017, 04/24/2018   Influenza-Unspecified 05/25/2022   PFIZER(Purple Top)SARS-COV-2 Vaccination 10/08/2019, 10/29/2019, 07/17/2020, 05/16/2021   PNEUMOCOCCAL CONJUGATE-20 04/28/2023   Pfizer(Comirnaty)Fall Seasonal Vaccine 12 years and older 06/11/2022   Pneumococcal Conjugate-13 04/24/2018   Pneumococcal Polysaccharide-23 05/28/2019   Tdap 02/26/2015    TDAP status: Up to date  Flu Vaccine status: Up to date  Pneumococcal vaccine status: Up to date  Covid-19 vaccine status: Completed vaccines  Qualifies for Shingles Vaccine? Yes   Zostavax completed No   Shingrix Completed?: No.    Education has been provided regarding the importance of this vaccine. Patient has been advised to call insurance company to determine out of pocket  expense if they have not yet received this vaccine. Advised may also receive vaccine at local pharmacy or Health Dept. Verbalized acceptance and understanding.  Screening Tests Health Maintenance  Topic Date Due   Zoster Vaccines- Shingrix (1 of 2) Never done   OPHTHALMOLOGY EXAM  02/03/2023   COVID-19 Vaccine (6 - 2025-26 season) 04/16/2024   Influenza Vaccine  11/13/2024 (Originally 03/16/2024)   DEXA SCAN  02/08/2025 (Originally 08/08/2004)   HEMOGLOBIN A1C  12/13/2024   Diabetic kidney evaluation - eGFR measurement  02/08/2025   Diabetic kidney evaluation - Urine ACR  02/08/2025   DTaP/Tdap/Td (2 - Td or Tdap) 02/25/2025   FOOT EXAM  03/13/2025   Mammogram  04/19/2025   Medicare Annual Wellness (AWV)  06/14/2025   Pneumococcal Vaccine: 50+ Years  Completed   Meningococcal B Vaccine  Aged Out    Health Maintenance  Health Maintenance Due  Topic Date Due   Zoster Vaccines- Shingrix (1 of 2) Never done   OPHTHALMOLOGY EXAM  02/03/2023   COVID-19 Vaccine (6 - 2025-26 season) 04/16/2024    Colorectal cancer screening: No longer required.   Mammogram status: No longer required due to age.   Lung Cancer Screening: (Low Dose CT Chest recommended if  Age 44-80 years, 20 pack-year currently smoking OR have quit w/in 15years.) does not qualify.   Lung Cancer Screening Referral: N/A  Additional Screening:  Hepatitis C Screening: does not qualify;   Vision Screening: Recommended annual ophthalmology exams for early detection of glaucoma and other disorders of the eye. Is the patient up to date with their annual eye exam?  Yes  Who is the provider or what is the name of the office in which the patient attends annual eye exams? [facility]  Dental Screening: Recommended annual dental exams for proper oral hygiene  Diabetic Foot Exam: Diabetic Foot Exam: Completed 03/13/2024  Community Resource Referral / Chronic Care Management: CRR required this visit?  No   CCM  required this visit?  No     Plan:     I have personally reviewed and noted the following in the patient's chart:   Medical and social history Use of alcohol, tobacco or illicit drugs  Current medications and supplements including opioid prescriptions. Patient is not currently taking opioid prescriptions. Functional ability and status Nutritional status Physical activity Advanced directives List of other physicians Hospitalizations, surgeries, and ER visits in previous 12 months Vitals Screenings to include cognitive, depression, and falls Referrals and appointments  In addition, I have reviewed and discussed with patient certain preventive protocols, quality metrics, and best practice recommendations. A written personalized care plan for preventive services as well as general preventive health recommendations were provided to patient.     Alegra Rost N Sparkle Aube, CMA   06/14/2024   After Visit Summary: (In Person-Printed) AVS printed and given to the patient  Nurse Notes: None.

## 2024-06-14 NOTE — Assessment & Plan Note (Addendum)
 Currently medications are none - diet only.  No hypoglycemic episodes noted. Last visit medical regimen changes were none. Lab Results  Component Value Date   HGBA1C 6.3 (A) 06/14/2024  A1C today is stable.  No medications needed.  She will continue to limit sweets and continue to increase activity as tolerated.

## 2024-06-15 ENCOUNTER — Telehealth: Payer: Self-pay | Admitting: Internal Medicine

## 2024-06-15 NOTE — Telephone Encounter (Signed)
 Spoke with patient.   CM

## 2024-06-15 NOTE — Telephone Encounter (Signed)
 Copied from CRM #[id number]. Topic: General - Other >> Jun 15, 2024 12:39 PM Emylou G wrote: Reason for CRM .Ariana Guzman Pls call patient back.Ariana Guzman Jules Bowens M.. wanted to get back with her about personal discussion .. asap

## 2024-07-26 ENCOUNTER — Telehealth: Payer: Self-pay

## 2024-07-26 NOTE — Telephone Encounter (Signed)
 Please advise? Pt cannot drive and does not have transportation?

## 2024-07-26 NOTE — Telephone Encounter (Signed)
 Copied from CRM #[id number]. Topic: Clinical - Red Word Triage >> Jul 25, 2024  4:05 PM Sophia H wrote: Red Word that prompted transfer to Nurse Triage: Patient believes she has a sinus infection, soreness across her eyes, headache, hoarseness & yellow mucus. Req medication - unable to drive at this time and cannot get a ride either. Patient declined NT, wants to speak directly with clinic nurse.   [id number] - [address], [place] - [address]

## 2024-07-27 NOTE — Telephone Encounter (Signed)
 Please call to schedule appt.  KP

## 2024-08-22 ENCOUNTER — Telehealth: Payer: Self-pay

## 2024-08-22 NOTE — Telephone Encounter (Signed)
 Spoke with patient. She needed advise about red swollen leg and black spot on her leg she also stated her leg is sore. I informed her she should go to the ER, she said she will go to [facility]. I advised that she still go to the ER. She said her son will come by in a little while and hopefully he would take her. I informed her if he does not take her to call 911 so the ambulance can come get her and take her.   JJM

## 2024-08-22 NOTE — Telephone Encounter (Signed)
 Copied from CRM [id number]. Topic: Clinical - Red Word Triage >> Aug 22, 2024  4:12 PM Avram MATSU wrote: Red Word that prompted transfer to Nurse Triage: swollen legs since Saturday and have some black spots. Pt stated she is on blood thinners.  Pt was warmed transferred to the CAL she wanted to speak with a office nurse

## 2024-08-27 ENCOUNTER — Ambulatory Visit
Admission: EM | Admit: 2024-08-27 | Discharge: 2024-08-27 | Disposition: A | Discharge status: Home / Self Care | Attending: Emergency Medicine | Admitting: Emergency Medicine

## 2024-08-27 DIAGNOSIS — I878 Other specified disorders of veins: Secondary | ICD-10-CM | POA: Diagnosis not present

## 2024-08-27 DIAGNOSIS — L03119 Cellulitis of unspecified part of limb: Secondary | ICD-10-CM | POA: Diagnosis not present

## 2024-08-27 DIAGNOSIS — R600 Localized edema: Secondary | ICD-10-CM | POA: Diagnosis not present

## 2024-08-27 MED ORDER — CEFTRIAXONE SODIUM 1 G IJ SOLR
1.0000 g | Freq: Once | INTRAMUSCULAR | Status: AC
  Administered 2024-08-27: 1 g via INTRAMUSCULAR

## 2024-08-27 MED ORDER — DOXYCYCLINE HYCLATE 100 MG PO TABS: 100.0000 mg | ORAL_TABLET | Freq: Two times a day (BID) | ORAL | Status: DC

## 2024-08-27 MED ORDER — DOXYCYCLINE HYCLATE 100 MG PO CAPS: 100.0000 mg | ORAL_CAPSULE | Freq: Two times a day (BID) | ORAL | 0 refills | Status: AC

## 2024-08-27 MED ORDER — CEPHALEXIN 500 MG PO CAPS: 500.0000 mg | ORAL_CAPSULE | Freq: Four times a day (QID) | ORAL | 0 refills | Status: AC

## 2024-08-27 NOTE — ED Provider Notes (Signed)
 " HPI  SUBJECTIVE:  Ariana Guzman is a 84 y.o. female who presents with bilateral lower extremity edema, worsening erythema starting 8 days ago on 1/5.  She reports soreness, tenderness to palpation.  She denies fevers, chest pain, shortness of breath at rest, but reports slightly worsening shortness of breath with exertion.  She denies open, weeping sores, blisters, crusting.  No itching.  She was seen in the emergency department on 08/22/2024 for this, and was thought to have a CHF exacerbation and venous insufficiency.  She was found to be in CHF, given a dose of Lasix  with improvement in swelling in her legs.  She was advised to follow-up with her cardiologist ASAP, but son states that they are not able to have a visit until Wednesday 1/14.  He is concerned because the erythema seems to be worsening.  She has been taking Tylenol  and elevating her legs with improvement in the pain.  No aggravating factors.  She has a past medical history of hypertension, PAD, A-fib on Xarelto, diabetes, asthma, lower extremity edema, peripheral vascular disease, varicose veins of bilateral lower extremities, OSA, hyperlipidemia, stage IIIa chronic kidney disease, hypokalemia, asthma and peripheral neuropathy.  No history of MRSA.  PCP: She is establishing care with the [facility] clinic on 1/29   Past Medical History:  Diagnosis Date   Allergies    Arthritis    Asthma    Atrial fibrillation (HCC)    Hypertension    Peripheral arterial disease    Sleep apnea    CPAP    Past Surgical History:  Procedure Laterality Date   ABDOMINAL HYSTERECTOMY  1991   uterus only- still has cervix - done for prolapse   CATARACT EXTRACTION W/PHACO Left 11/24/2020   Procedure: CATARACT EXTRACTION PHACO AND INTRAOCULAR LENS PLACEMENT (IOC) LEFT 6.99 00:46.1;  Surgeon: Myrna Adine Anes, MD;  Location: [facility];  Service: Ophthalmology;  Laterality: Left;   CATARACT EXTRACTION W/PHACO Right 12/08/2020   Procedure:  CATARACT EXTRACTION PHACO AND INTRAOCULAR LENS PLACEMENT (IOC) RIGHT 5.13 00:34.2;  Surgeon: Myrna Adine Anes, MD;  Location: [facility];  Service: Ophthalmology;  Laterality: Right;   COLONOSCOPY  2009   normal   TOE SURGERY      Family History  Problem Relation Age of Onset   Breast cancer Other 52   Dementia Mother    Cancer Father        prostate and bone   Heart attack Sister     Social History[1]  Current Medications[2]  Allergies[3]   ROS  As noted in HPI.   Physical Exam  BP (!) 148/60 (BP Location: Right Arm)   Pulse 82   Temp 97.8 F (36.6 C) (Oral)   Resp 18   Wt 89.4 kg   SpO2 98%   BMI 31.80 kg/m   Constitutional: Well developed, well nourished, no acute distress Eyes: PERRL, EOMI, conjunctiva normal bilaterally HENT: Normocephalic, atraumatic,mucus membranes moist Respiratory: Clear to auscultation bilaterally, no rales, no wheezing, no rhonchi Cardiovascular: Normal rate and rhythm, no murmurs, no gallops, no rubs GI: Nondistended Back: no CVAT skin: Tender 2+ pitting edema bilateral lower extremities, diffuse blanchable erythema, swollen varicose veins.  Skin intact.          Musculoskeletal: See skin exam Neurologic: Alert & oriented x 3, CN III-XII grossly intact, no motor deficits, sensation grossly intact Psychiatric: Speech and behavior appropriate   ED Course   Medications  doxycycline  (VIBRA -TABS) tablet 100 mg (has no administration in  time range)  cefTRIAXone  (ROCEPHIN ) injection 1 g (1 g Intramuscular Given 08/27/24 2015)    Orders Placed This Encounter  Procedures   Apply ace wrap    To bilateral LE    Standing Status:   Standing    Number of Occurrences:   1   No results found for this or any previous visit (from the past 24 hours). No results found.  ED Clinical Impression  1. Venous stasis of both lower extremities   2. Cellulitis of lower extremity, unspecified laterality   3. Leg edema       ED Assessment/Plan     ER records, labs reviewed as noted in HPI.  Calculated creatinine clearance from labs done on 08/22/2024 77 mL/min.   Patient presents with venous stasis.  Skin is intact she has diffuse tender bilateral lower extremity swelling and significant blanchable tender erythema.  Concern for infection.  I suspect she has a component of CHF to this, but she is in no respiratory distress, lungs are clear, satting well on room air.  Will send home with Keflex  500 mg 4 times daily for 7 days and doxycycline  100 mg p.o. twice daily for 7 days.  Patient states she will not be able to get her medications until tomorrow, so we will give Rocephin  1 g here and doxycycline  100 mg p.o. daily tonight.  Will place bilateral Ace wrap's to protect what I think are thrombosed varicose veins and to prevent skin tears.  I am hesitant to give her any Lasix  for her lower extremity edema in the absence of having labs today.  She has follow-up with cardiology in 2 days.  Had a long discussion with patient and son to notify cardiology that she is on antibiotics and whether she is improving or not.  She is to go to the ER for worsening shortness of breath, wheezing, worsening erythema, pain or swelling despite being on the antibiotics, or if she does not respond in 48 hours.  Discussed MDM, treatment plan, and plan for follow-up with patient Discussed sn/sx that should prompt return to the ED. patient agrees with plan.   Meds ordered this encounter  Medications   doxycycline  (VIBRAMYCIN ) 100 MG capsule    Sig: Take 1 capsule (100 mg total) by mouth 2 (two) times daily for 7 days.    Dispense:  14 capsule    Refill:  0   cephALEXin  (KEFLEX ) 500 MG capsule    Sig: Take 1 capsule (500 mg total) by mouth 4 (four) times daily for 7 days.    Dispense:  28 capsule    Refill:  0   doxycycline  (VIBRA -TABS) tablet 100 mg   cefTRIAXone  (ROCEPHIN ) injection 1 g      *This clinic note was created using  Scientist, clinical (histocompatibility and immunogenetics). Therefore, there may be occasional mistakes despite careful proofreading. ?      [1]  Social History Tobacco Use   Smoking status: Former    Current packs/day: 0.00    Average packs/day: 0.5 packs/day for 29.0 years (14.5 ttl pk-yrs)    Types: Cigarettes    Start date: 09/28/1960    Quit date: 09/28/1989    Years since quitting: 34.9   Smokeless tobacco: Never   Tobacco comments:    smoking cessation materials not required  Vaping Use   Vaping status: Never Used  Substance Use Topics   Alcohol use: No   Drug use: No  [2]  Current Facility-Administered Medications:    doxycycline  (VIBRA -TABS)  tablet 100 mg, 100 mg, Oral, Q12H, Duston Smolenski, MD  Current Outpatient Medications:    cephALEXin  (KEFLEX ) 500 MG capsule, Take 1 capsule (500 mg total) by mouth 4 (four) times daily for 7 days., Disp: 28 capsule, Rfl: 0   doxycycline  (VIBRAMYCIN ) 100 MG capsule, Take 1 capsule (100 mg total) by mouth 2 (two) times daily for 7 days., Disp: 14 capsule, Rfl: 0   albuterol  (PROVENTIL ) (2.5 MG/3ML) 0.083% nebulizer solution, Take 3 mLs (2.5 mg total) by nebulization every 6 (six) hours as needed., Disp: 75 mL, Rfl: 0   albuterol  (VENTOLIN  HFA) 108 (90 Base) MCG/ACT inhaler, Inhale 2 puffs into the lungs every 4 (four) hours as needed., Disp: 18 g, Rfl: 0   ALPRAZolam  (XANAX ) 0.25 MG tablet, Take 1 tablet (0.25 mg total) by mouth at bedtime., Disp: 30 tablet, Rfl: 3   Ascorbic Acid (VITAMIN C) 1000 MG tablet, Take 1,000 mg by mouth daily., Disp: , Rfl:    Calcium Carb-Cholecalciferol [phone] MG-UNIT TABS, Take by mouth., Disp: , Rfl:    cetirizine (ZYRTEC) 10 MG tablet, Take 10 mg by mouth 2 (two) times daily., Disp: , Rfl:    cholecalciferol (VITAMIN D) 1000 units tablet, Take 1,000 Units by mouth daily., Disp: , Rfl:    Fluticasone -Umeclidin-Vilant (TRELEGY ELLIPTA) 200-62.5-25 MCG/ACT AEPB, Inhale into the lungs., Disp: , Rfl:    gabapentin (NEURONTIN) 100 MG  capsule, Take 100 mg by mouth in the morning, at noon, and at bedtime., Disp: , Rfl:    ipratropium (ATROVENT ) 0.06 % nasal spray, Place 2 sprays into both nostrils 4 (four) times daily., Disp: 15 mL, Rfl: 0   latanoprost (XALATAN) 0.005 % ophthalmic solution, 1 drop at bedtime., Disp: , Rfl:    levothyroxine  (SYNTHROID ) 75 MCG tablet, TAKE 1 TABLET BY MOUTH ONCE DAILY BEFORE BREAKFAST, Disp: 90 tablet, Rfl: 0   losartan  (COZAAR ) 100 MG tablet, Take 1 tablet by mouth once daily, Disp: 90 tablet, Rfl: 0   Magnesium 100 MG CAPS, Take by mouth., Disp: , Rfl:    MELATONIN PO, Take by mouth., Disp: , Rfl:    metoprolol  tartrate (LOPRESSOR ) 25 MG tablet, TAKE 1 TABLET BY MOUTH ONCE DAILY IN THE MORNING, 1 TABLET AT NOON AND 1 TABLET AT BEDTIME, Disp: 270 tablet, Rfl: 0   montelukast  (SINGULAIR ) 10 MG tablet, Take 10 mg by mouth daily., Disp: , Rfl:    Multiple Vitamins-Minerals (OCUVITE EYE HEATLH GUMMIES PO), Take by mouth daily., Disp: , Rfl:    Rivaroxaban (XARELTO) 15 MG TABS tablet, Take 15 mg by mouth daily with supper. , Disp: , Rfl:    spironolactone  (ALDACTONE ) 25 MG tablet, Take 1 tablet (25 mg total) by mouth 2 (two) times daily., Disp: 180 tablet, Rfl: 1 [3]  Allergies Allergen Reactions   Tramadol Shortness Of Breath   Meloxicam Rash     Emoni Yang, MD 08/28/24 [phone]  "

## 2024-08-27 NOTE — Discharge Instructions (Signed)
 I have placed Ace wrap's to help with the swelling and to help protect the varicose veins.  Elevate your legs is much as possible.  Notify cardiology that she is on antibiotics and whether she is improving or not.  go to the ER for worsening shortness of breath, wheezing, worsening redness, pain or swelling despite being on the antibiotics, or if she does not respond in 48 hours.

## 2024-08-27 NOTE — ED Triage Notes (Signed)
 Pt present bilateral leg swelling with discoloration. Symptoms started last Sunday.  Pt states has blister on both legs.

## 2024-09-03 ENCOUNTER — Encounter: Payer: Self-pay | Admitting: Family Medicine

## 2024-09-04 ENCOUNTER — Other Ambulatory Visit: Payer: Self-pay | Admitting: Family Medicine

## 2024-09-04 DIAGNOSIS — S86011A Strain of right Achilles tendon, initial encounter: Secondary | ICD-10-CM

## 2024-09-04 DIAGNOSIS — S93421A Sprain of deltoid ligament of right ankle, initial encounter: Secondary | ICD-10-CM

## 2024-09-04 DIAGNOSIS — M7632 Iliotibial band syndrome, left leg: Secondary | ICD-10-CM

## 2024-09-04 DIAGNOSIS — M7071 Other bursitis of hip, right hip: Secondary | ICD-10-CM

## 2024-09-04 DIAGNOSIS — M1712 Unilateral primary osteoarthritis, left knee: Secondary | ICD-10-CM

## 2024-09-04 NOTE — Telephone Encounter (Signed)
 Please review and advise. JM

## 2024-09-05 ENCOUNTER — Other Ambulatory Visit: Payer: Self-pay

## 2024-09-05 DIAGNOSIS — I10 Essential (primary) hypertension: Secondary | ICD-10-CM

## 2024-09-05 MED ORDER — LOSARTAN POTASSIUM 100 MG PO TABS: 100.0000 mg | ORAL_TABLET | Freq: Every day | ORAL | 0 refills | Status: AC

## 2024-10-12 ENCOUNTER — Encounter: Admitting: Family Medicine
